# Patient Record
Sex: Female | Born: 1935 | Race: White | Hispanic: No | Smoking: Never smoker
Health system: Southern US, Community
[De-identification: ages and names within clinical notes are randomized; demographics above are authoritative.]

## PROBLEM LIST (undated history)

## (undated) DIAGNOSIS — IMO0001 Reserved for inherently not codable concepts without codable children: Secondary | ICD-10-CM

## (undated) DIAGNOSIS — I639 Cerebral infarction, unspecified: Secondary | ICD-10-CM

## (undated) DIAGNOSIS — N189 Chronic kidney disease, unspecified: Secondary | ICD-10-CM

## (undated) DIAGNOSIS — S72141A Displaced intertrochanteric fracture of right femur, initial encounter for closed fracture: Secondary | ICD-10-CM

## (undated) DIAGNOSIS — C539 Malignant neoplasm of cervix uteri, unspecified: Secondary | ICD-10-CM

## (undated) DIAGNOSIS — J449 Chronic obstructive pulmonary disease, unspecified: Secondary | ICD-10-CM

## (undated) DIAGNOSIS — Z923 Personal history of irradiation: Secondary | ICD-10-CM

## (undated) DIAGNOSIS — E785 Hyperlipidemia, unspecified: Secondary | ICD-10-CM

## (undated) DIAGNOSIS — H269 Unspecified cataract: Secondary | ICD-10-CM

## (undated) DIAGNOSIS — IMO0002 Reserved for concepts with insufficient information to code with codable children: Secondary | ICD-10-CM

## (undated) DIAGNOSIS — M1990 Unspecified osteoarthritis, unspecified site: Secondary | ICD-10-CM

## (undated) DIAGNOSIS — I10 Essential (primary) hypertension: Secondary | ICD-10-CM

## (undated) DIAGNOSIS — I209 Angina pectoris, unspecified: Secondary | ICD-10-CM

## (undated) DIAGNOSIS — I219 Acute myocardial infarction, unspecified: Secondary | ICD-10-CM

## (undated) DIAGNOSIS — K219 Gastro-esophageal reflux disease without esophagitis: Secondary | ICD-10-CM

## (undated) DIAGNOSIS — M810 Age-related osteoporosis without current pathological fracture: Secondary | ICD-10-CM

## (undated) DIAGNOSIS — Z5189 Encounter for other specified aftercare: Secondary | ICD-10-CM

## (undated) DIAGNOSIS — C519 Malignant neoplasm of vulva, unspecified: Secondary | ICD-10-CM

## (undated) HISTORY — DX: Gastro-esophageal reflux disease without esophagitis: K21.9

## (undated) HISTORY — DX: Cerebral infarction, unspecified: I63.9

## (undated) HISTORY — DX: Personal history of irradiation: Z92.3

## (undated) HISTORY — PX: ABDOMINAL HYSTERECTOMY: SHX81

## (undated) HISTORY — DX: Unspecified cataract: H26.9

## (undated) HISTORY — DX: Reserved for inherently not codable concepts without codable children: IMO0001

## (undated) HISTORY — DX: Reserved for concepts with insufficient information to code with codable children: IMO0002

## (undated) HISTORY — DX: Encounter for other specified aftercare: Z51.89

## (undated) HISTORY — DX: Age-related osteoporosis without current pathological fracture: M81.0

## (undated) HISTORY — PX: HERNIA REPAIR: SHX51

## (undated) HISTORY — PX: TUBAL LIGATION: SHX77

## (undated) HISTORY — DX: Acute myocardial infarction, unspecified: I21.9

## (undated) HISTORY — DX: Malignant neoplasm of cervix uteri, unspecified: C53.9

## (undated) HISTORY — PX: APPENDECTOMY: SHX54

---

## 1976-07-18 DIAGNOSIS — I639 Cerebral infarction, unspecified: Secondary | ICD-10-CM

## 1976-07-18 HISTORY — DX: Cerebral infarction, unspecified: I63.9

## 1997-12-22 ENCOUNTER — Other Ambulatory Visit: Admission: RE | Admit: 1997-12-22 | Discharge: 1997-12-22 | Payer: Self-pay | Admitting: *Deleted

## 1999-02-01 ENCOUNTER — Other Ambulatory Visit: Admission: RE | Admit: 1999-02-01 | Discharge: 1999-02-01 | Payer: Self-pay | Admitting: *Deleted

## 1999-06-22 ENCOUNTER — Encounter: Payer: Self-pay | Admitting: *Deleted

## 1999-06-22 ENCOUNTER — Encounter: Admission: RE | Admit: 1999-06-22 | Discharge: 1999-06-22 | Payer: Self-pay | Admitting: *Deleted

## 2000-02-04 ENCOUNTER — Encounter: Payer: Self-pay | Admitting: Internal Medicine

## 2000-02-04 ENCOUNTER — Encounter: Admission: RE | Admit: 2000-02-04 | Discharge: 2000-02-04 | Payer: Self-pay | Admitting: Rheumatology

## 2000-09-09 ENCOUNTER — Emergency Department (HOSPITAL_COMMUNITY): Admission: EM | Admit: 2000-09-09 | Discharge: 2000-09-09 | Payer: Self-pay | Admitting: Emergency Medicine

## 2000-09-09 ENCOUNTER — Encounter: Payer: Self-pay | Admitting: Emergency Medicine

## 2001-01-20 ENCOUNTER — Emergency Department (HOSPITAL_COMMUNITY): Admission: EM | Admit: 2001-01-20 | Discharge: 2001-01-20 | Payer: Self-pay

## 2001-05-02 ENCOUNTER — Encounter: Payer: Self-pay | Admitting: Internal Medicine

## 2001-05-02 ENCOUNTER — Encounter: Admission: RE | Admit: 2001-05-02 | Discharge: 2001-05-02 | Payer: Self-pay | Admitting: Internal Medicine

## 2002-05-07 ENCOUNTER — Encounter: Payer: Self-pay | Admitting: Internal Medicine

## 2002-05-07 ENCOUNTER — Encounter: Admission: RE | Admit: 2002-05-07 | Discharge: 2002-05-07 | Payer: Self-pay | Admitting: Internal Medicine

## 2003-08-22 ENCOUNTER — Ambulatory Visit (HOSPITAL_COMMUNITY): Admission: RE | Admit: 2003-08-22 | Discharge: 2003-08-22 | Payer: Self-pay | Admitting: Orthopedic Surgery

## 2003-10-27 ENCOUNTER — Inpatient Hospital Stay (HOSPITAL_COMMUNITY): Admission: EM | Admit: 2003-10-27 | Discharge: 2003-10-28 | Payer: Self-pay | Admitting: Emergency Medicine

## 2004-04-08 ENCOUNTER — Encounter: Admission: RE | Admit: 2004-04-08 | Discharge: 2004-04-08 | Payer: Self-pay | Admitting: Internal Medicine

## 2004-06-01 ENCOUNTER — Inpatient Hospital Stay (HOSPITAL_COMMUNITY): Admission: RE | Admit: 2004-06-01 | Discharge: 2004-06-11 | Payer: Self-pay | Admitting: Surgery

## 2004-06-02 ENCOUNTER — Encounter (INDEPENDENT_AMBULATORY_CARE_PROVIDER_SITE_OTHER): Payer: Self-pay | Admitting: *Deleted

## 2005-08-18 ENCOUNTER — Encounter: Admission: RE | Admit: 2005-08-18 | Discharge: 2005-08-18 | Payer: Self-pay | Admitting: Internal Medicine

## 2006-03-05 ENCOUNTER — Emergency Department (HOSPITAL_COMMUNITY): Admission: EM | Admit: 2006-03-05 | Discharge: 2006-03-05 | Payer: Self-pay | Admitting: Emergency Medicine

## 2006-05-12 ENCOUNTER — Emergency Department (HOSPITAL_COMMUNITY): Admission: EM | Admit: 2006-05-12 | Discharge: 2006-05-12 | Payer: Self-pay | Admitting: Emergency Medicine

## 2006-09-13 ENCOUNTER — Encounter: Admission: RE | Admit: 2006-09-13 | Discharge: 2006-09-13 | Payer: Self-pay | Admitting: Internal Medicine

## 2007-10-15 ENCOUNTER — Encounter: Admission: RE | Admit: 2007-10-15 | Discharge: 2007-10-15 | Payer: Self-pay | Admitting: Internal Medicine

## 2008-08-09 ENCOUNTER — Emergency Department (HOSPITAL_COMMUNITY): Admission: EM | Admit: 2008-08-09 | Discharge: 2008-08-09 | Payer: Self-pay | Admitting: Emergency Medicine

## 2008-10-17 ENCOUNTER — Encounter: Admission: RE | Admit: 2008-10-17 | Discharge: 2008-10-17 | Payer: Self-pay | Admitting: Internal Medicine

## 2009-01-22 ENCOUNTER — Encounter: Admission: RE | Admit: 2009-01-22 | Discharge: 2009-01-22 | Payer: Self-pay | Admitting: Internal Medicine

## 2009-10-22 ENCOUNTER — Encounter: Admission: RE | Admit: 2009-10-22 | Discharge: 2009-10-22 | Payer: Self-pay | Admitting: Internal Medicine

## 2009-11-11 ENCOUNTER — Encounter: Admission: RE | Admit: 2009-11-11 | Discharge: 2009-11-11 | Payer: Self-pay | Admitting: Internal Medicine

## 2009-11-11 ENCOUNTER — Inpatient Hospital Stay (HOSPITAL_COMMUNITY): Admission: EM | Admit: 2009-11-11 | Discharge: 2009-11-13 | Payer: Self-pay | Admitting: Emergency Medicine

## 2010-02-15 ENCOUNTER — Encounter: Admission: RE | Admit: 2010-02-15 | Discharge: 2010-04-15 | Payer: Self-pay | Admitting: Nephrology

## 2010-03-15 ENCOUNTER — Emergency Department (HOSPITAL_COMMUNITY)
Admission: EM | Admit: 2010-03-15 | Discharge: 2010-03-15 | Payer: Self-pay | Source: Home / Self Care | Admitting: Family Medicine

## 2010-07-03 ENCOUNTER — Inpatient Hospital Stay (HOSPITAL_COMMUNITY)
Admission: EM | Admit: 2010-07-03 | Discharge: 2010-07-09 | Payer: Self-pay | Source: Home / Self Care | Attending: Internal Medicine | Admitting: Internal Medicine

## 2010-07-06 ENCOUNTER — Encounter (INDEPENDENT_AMBULATORY_CARE_PROVIDER_SITE_OTHER): Payer: Self-pay | Admitting: Internal Medicine

## 2010-07-21 ENCOUNTER — Inpatient Hospital Stay (HOSPITAL_COMMUNITY): Admission: EM | Admit: 2010-07-21 | Discharge: 2010-07-27 | Payer: Self-pay | Source: Home / Self Care

## 2010-07-21 LAB — CBC
HCT: 34.6 % — ABNORMAL LOW (ref 36.0–46.0)
Hemoglobin: 11 g/dL — ABNORMAL LOW (ref 12.0–15.0)
MCH: 29.1 pg (ref 26.0–34.0)
MCHC: 31.8 g/dL (ref 30.0–36.0)
MCV: 91.5 fL (ref 78.0–100.0)
Platelets: 351 10*3/uL (ref 150–400)
RBC: 3.78 MIL/uL — ABNORMAL LOW (ref 3.87–5.11)
RDW: 13.2 % (ref 11.5–15.5)
WBC: 15.1 10*3/uL — ABNORMAL HIGH (ref 4.0–10.5)

## 2010-07-21 LAB — URINALYSIS, ROUTINE W REFLEX MICROSCOPIC
Bilirubin Urine: NEGATIVE
Hemoglobin, Urine: NEGATIVE
Ketones, ur: NEGATIVE mg/dL
Nitrite: NEGATIVE
Protein, ur: NEGATIVE mg/dL
Specific Gravity, Urine: 1.015 (ref 1.005–1.030)
Urine Glucose, Fasting: NEGATIVE mg/dL
Urobilinogen, UA: 0.2 mg/dL (ref 0.0–1.0)
pH: 6 (ref 5.0–8.0)

## 2010-07-21 LAB — COMPREHENSIVE METABOLIC PANEL
ALT: 10 U/L (ref 0–35)
AST: 18 U/L (ref 0–37)
Albumin: 3.1 g/dL — ABNORMAL LOW (ref 3.5–5.2)
Alkaline Phosphatase: 89 U/L (ref 39–117)
BUN: 23 mg/dL (ref 6–23)
CO2: 23 mEq/L (ref 19–32)
Calcium: 8.9 mg/dL (ref 8.4–10.5)
Chloride: 105 mEq/L (ref 96–112)
Creatinine, Ser: 2.08 mg/dL — ABNORMAL HIGH (ref 0.4–1.2)
GFR calc Af Amer: 28 mL/min — ABNORMAL LOW (ref >60)
GFR calc non Af Amer: 23 mL/min — ABNORMAL LOW (ref >60)
Glucose, Bld: 104 mg/dL — ABNORMAL HIGH (ref 70–99)
Potassium: 4.2 mEq/L (ref 3.5–5.1)
Sodium: 137 mEq/L (ref 135–145)
Total Bilirubin: 0.5 mg/dL (ref 0.3–1.2)
Total Protein: 6.3 g/dL (ref 6.0–8.3)

## 2010-07-21 LAB — DIFFERENTIAL
Basophils Absolute: 0 10*3/uL (ref 0.0–0.1)
Basophils Relative: 0 % (ref 0–1)
Eosinophils Absolute: 0.2 10*3/uL (ref 0.0–0.7)
Eosinophils Relative: 1 % (ref 0–5)
Lymphocytes Relative: 5 % — ABNORMAL LOW (ref 12–46)
Lymphs Abs: 0.8 10*3/uL (ref 0.7–4.0)
Monocytes Absolute: 1.3 10*3/uL — ABNORMAL HIGH (ref 0.1–1.0)
Monocytes Relative: 9 % (ref 3–12)
Neutro Abs: 12.8 10*3/uL — ABNORMAL HIGH (ref 1.7–7.7)
Neutrophils Relative %: 85 % — ABNORMAL HIGH (ref 43–77)

## 2010-07-21 LAB — URINE MICROSCOPIC-ADD ON

## 2010-07-21 LAB — LIPASE, BLOOD: Lipase: 30 U/L (ref 11–59)

## 2010-07-22 LAB — CBC
HCT: 34.4 % — ABNORMAL LOW (ref 36.0–46.0)
Hemoglobin: 11 g/dL — ABNORMAL LOW (ref 12.0–15.0)
MCH: 29.3 pg (ref 26.0–34.0)
MCHC: 32 g/dL (ref 30.0–36.0)
MCV: 91.5 fL (ref 78.0–100.0)
Platelets: 338 10*3/uL (ref 150–400)
RBC: 3.76 MIL/uL — ABNORMAL LOW (ref 3.87–5.11)
RDW: 13.1 % (ref 11.5–15.5)
WBC: 15.6 10*3/uL — ABNORMAL HIGH (ref 4.0–10.5)

## 2010-07-22 LAB — BASIC METABOLIC PANEL
BUN: 19 mg/dL (ref 6–23)
CO2: 23 mEq/L (ref 19–32)
Calcium: 8.5 mg/dL (ref 8.4–10.5)
Chloride: 107 mEq/L (ref 96–112)
Creatinine, Ser: 2.05 mg/dL — ABNORMAL HIGH (ref 0.4–1.2)
GFR calc Af Amer: 28 mL/min — ABNORMAL LOW (ref >60)
GFR calc non Af Amer: 23 mL/min — ABNORMAL LOW (ref >60)
Glucose, Bld: 111 mg/dL — ABNORMAL HIGH (ref 70–99)
Potassium: 4.4 mEq/L (ref 3.5–5.1)
Sodium: 137 mEq/L (ref 135–145)

## 2010-07-22 LAB — PHOSPHORUS: Phosphorus: 2.8 mg/dL (ref 2.3–4.6)

## 2010-07-22 LAB — MAGNESIUM: Magnesium: 1.7 mg/dL (ref 1.5–2.5)

## 2010-07-23 LAB — CBC
HCT: 31 % — ABNORMAL LOW (ref 36.0–46.0)
Hemoglobin: 9.8 g/dL — ABNORMAL LOW (ref 12.0–15.0)
MCH: 29 pg (ref 26.0–34.0)
MCHC: 31.6 g/dL (ref 30.0–36.0)
MCV: 91.7 fL (ref 78.0–100.0)
Platelets: 291 10*3/uL (ref 150–400)
RBC: 3.38 MIL/uL — ABNORMAL LOW (ref 3.87–5.11)
RDW: 13.2 % (ref 11.5–15.5)
WBC: 12.9 10*3/uL — ABNORMAL HIGH (ref 4.0–10.5)

## 2010-08-02 LAB — BASIC METABOLIC PANEL
BUN: 28 mg/dL — ABNORMAL HIGH (ref 6–23)
BUN: 23 mg/dL (ref 6–23)
BUN: 20 mg/dL (ref 6–23)
CO2: 16 mEq/L — ABNORMAL LOW (ref 19–32)
CO2: 16 mEq/L — ABNORMAL LOW (ref 19–32)
CO2: 19 mEq/L (ref 19–32)
Calcium: 8.2 mg/dL — ABNORMAL LOW (ref 8.4–10.5)
Calcium: 8 mg/dL — ABNORMAL LOW (ref 8.4–10.5)
Calcium: 8.1 mg/dL — ABNORMAL LOW (ref 8.4–10.5)
Chloride: 109 mEq/L (ref 96–112)
Chloride: 116 mEq/L — ABNORMAL HIGH (ref 96–112)
Chloride: 115 mEq/L — ABNORMAL HIGH (ref 96–112)
Creatinine, Ser: 2.48 mg/dL — ABNORMAL HIGH (ref 0.4–1.2)
Creatinine, Ser: 2.22 mg/dL — ABNORMAL HIGH (ref 0.4–1.2)
Creatinine, Ser: 1.92 mg/dL — ABNORMAL HIGH (ref 0.4–1.2)
GFR calc Af Amer: 23 mL/min — ABNORMAL LOW (ref >60)
GFR calc Af Amer: 26 mL/min — ABNORMAL LOW (ref >60)
GFR calc Af Amer: 30 mL/min — ABNORMAL LOW (ref >60)
GFR calc non Af Amer: 19 mL/min — ABNORMAL LOW (ref >60)
GFR calc non Af Amer: 21 mL/min — ABNORMAL LOW (ref >60)
GFR calc non Af Amer: 25 mL/min — ABNORMAL LOW (ref >60)
Glucose, Bld: 66 mg/dL — ABNORMAL LOW (ref 70–99)
Glucose, Bld: 89 mg/dL (ref 70–99)
Glucose, Bld: 111 mg/dL — ABNORMAL HIGH (ref 70–99)
Potassium: 3.8 mEq/L (ref 3.5–5.1)
Potassium: 3.3 mEq/L — ABNORMAL LOW (ref 3.5–5.1)
Potassium: 3.3 mEq/L — ABNORMAL LOW (ref 3.5–5.1)
Sodium: 136 mEq/L (ref 135–145)
Sodium: 139 mEq/L (ref 135–145)
Sodium: 141 mEq/L (ref 135–145)

## 2010-08-02 LAB — CULTURE, BLOOD (ROUTINE X 2)
Culture  Setup Time: 201201052239
Culture  Setup Time: 201201052239
Culture: NO GROWTH
Culture: NO GROWTH

## 2010-08-02 LAB — CBC
HCT: 30.8 % — ABNORMAL LOW (ref 36.0–46.0)
HCT: 32.2 % — ABNORMAL LOW (ref 36.0–46.0)
Hemoglobin: 10 g/dL — ABNORMAL LOW (ref 12.0–15.0)
Hemoglobin: 10.6 g/dL — ABNORMAL LOW (ref 12.0–15.0)
MCH: 29.2 pg (ref 26.0–34.0)
MCH: 29.3 pg (ref 26.0–34.0)
MCHC: 32.5 g/dL (ref 30.0–36.0)
MCHC: 32.9 g/dL (ref 30.0–36.0)
MCV: 89.8 fL (ref 78.0–100.0)
MCV: 89 fL (ref 78.0–100.0)
Platelets: 364 10*3/uL (ref 150–400)
Platelets: 359 10*3/uL (ref 150–400)
RBC: 3.43 MIL/uL — ABNORMAL LOW (ref 3.87–5.11)
RBC: 3.62 MIL/uL — ABNORMAL LOW (ref 3.87–5.11)
RDW: 13.4 % (ref 11.5–15.5)
RDW: 13.5 % (ref 11.5–15.5)
WBC: 23.6 10*3/uL — ABNORMAL HIGH (ref 4.0–10.5)
WBC: 19.4 10*3/uL — ABNORMAL HIGH (ref 4.0–10.5)

## 2010-08-02 LAB — CLOSTRIDIUM DIFFICILE BY PCR

## 2010-08-08 ENCOUNTER — Encounter: Payer: Self-pay | Admitting: Internal Medicine

## 2010-08-13 NOTE — Consult Note (Signed)
  NAMELURLINE, CAVER NO.:  [id number]  MEDICAL RECORD NO.:  [id number]          PATIENT TYPE:  INP  LOCATION:  5524                         FACILITY:  [facility]  PHYSICIAN:  Bernette Redbird, M.D.   DATE OF BIRTH:  12-20-1925  DATE OF CONSULTATION: DATE OF DISCHARGE:                                CONSULTATION   REASON FOR CONSULTATION:  Dr. Carollee Massed asked [place] to see this very pleasant 77 year old female because of diarrhea and an abnormal radiographic appearance of the colon on CT scanning.  HISTORY OF PRESENT ILLNESS:  The patient gives a roughly 2-week history of diarrhea, which coincides with antibiotic therapy for a UTI. However, several days ago, her symptoms changed with low abdominal discomfort and dehydration, and she came to the hospital where a CT showed significant proximal colitis involving the ascending and transverse colon.  Her diarrhea was never bloody.  She really did not have abdominal pain except at time of bowel movements.  She has no prior history of colitis.  It is not clear at this time whether she has had a previous colonoscopy.  I have checked office records, and as of this time, we have not located any such record of a previous colonoscopy.  ALLERGIES:  PENICILLIN and SULFA.  OUTPATIENT MEDICATIONS: 1. Omeprazole. 2. Tramadol. 3. Lipitor. 4. Verapamil. 5. Amlodipine. 6. Vitamins.  OPERATIONS:  Appendectomy, hysterectomy, and repair of rectal prolapse.  MEDICAL ILLNESSES:  Chronic renal insufficiency, history of MI, hyperlipidemia, GERD, and hypertension.  HABITS:  Nonsmoker, nondrinker.  FAMILY HISTORY:  Negative for GI illnesses as far as I am aware.  SOCIAL HISTORY:  Lives alone.  REVIEW OF SYSTEMS:  See HPI.  I do not believe there were ever fevers, and no one around her has been sick with any similar illness.  PHYSICAL EXAMINATION:  GENERAL:  A pleasant, petite Caucasian female in no acute distress,  whatsoever. HEENT:  Anicteric, no pallor. CHEST:  Clear. HEART:  Without gallops, murmurs, or arrhythmias. ABDOMEN:  Without organomegaly, guarding, mass effect, or significant tenderness at this time.  LABORATORY DATA:  White count normal, hemoglobin 10.7 as of yesterday, and C diff negative.  Creatinine 1.9.  IMPRESSION:  I think this is most compatible with ischemic colitis.  PLAN:  Colonoscopy today to try and clarify the diagnosis and hence the prognosis and possibly to guide further management.  Ashby Dawes, purpose, and risks reviewed and patient and son agreeable.  We appreciate the opportunity to have seen this very pleasant patient in consultation with you.          ______________________________ Bernette Redbird, M.D.     RB/MEDQ  D:  07/06/2010  T:  07/07/2010  Job:  [id number]  cc:   Merlene Laughter. Renae Gloss, M.D.  Electronically Signed by Bernette Redbird M.D. on 08/13/2010 04:50:28 PM

## 2010-08-13 NOTE — Op Note (Signed)
Meredith Bender, Meredith Bender NO.:  [id number]  MEDICAL RECORD NO.:  [id number]          PATIENT TYPE:  INP  LOCATION:  5524                         FACILITY:  [facility]  PHYSICIAN:  Bernette Redbird, M.D.   DATE OF BIRTH:  1926-01-18  DATE OF PROCEDURE:  07/06/2010 DATE OF DISCHARGE:                              OPERATIVE REPORT   PROCEDURE:  Colonoscopy with biopsy.  INDICATION:  An 77 year old female with significant diarrhea of 2 weeks' duration, following antibiotic therapy for UTIs, but with negative C. diff toxin and with CT scan showing evidence of significant colitis in the proximal colon.  FINDINGS:  Remarkably normal exam.  Diminutive polyp biopsied.  Random biopsies obtained.  PROCEDURE:  The nature, purpose, risks of the procedure have been reviewed with the patient and her son.  She provided written consent and was brought from her hospital room to the endoscopy unit where sedation with fentanyl 75 mcg and Versed 6 mg IV was administered, resulting in mild-to-moderate sedation without clinical instability.  The Pentax pediatric video colonoscope was advanced around the colon with some degree of looping, which was controlled by external abdominal compression and placing the patient in the supine position.  This procedure was done unprepped.  There was a moderate amount of liquid milk shake consistency stool scattered throughout the colon.  No formed stool was seen.  In all areas, the mucosa looked normal, with the exception of the rectosigmoid where there was decreased vascularity consistent with some mucosal edema.  However, frank inflammatory changes were absent, specifically, no erythema, friability, granularity, exudate, or ulcerations.  At the conclusion of the procedure, random mucosal biopsies were obtained (except from the rectum) to check for evidence of microscopic colitis.  Just above the cecum in the ascending colon was a 3-4-mm sessile  polyp removed by several cold biopsies with adequate hemostasis.  No large polyps, cancer, pseudomembranes, or obvious colitis were noted. I did not see any diverticular disease or vascular malformations.  Because this procedure was done unprepped and there was a moderate amount of liquid stool which was not readily able to be suctioned up, I would not consider this a definitive screening exam, although I am confident that any large, bulky, circumferential, masses would have been identified in that any diffuse mucosal abnormalities would have been appreciated.  The patient tolerated this procedure well and there were no apparent complications.  IMPRESSION:  Essentially normal examination except for solitary diminutive polyp.  PLAN: 1. Await pathology on polyp and random biopsies. 2. In view of the patient's advanced age, I do not feel that routine     colonoscopic surveillance would be needed even if the polyp removed     comes back adenomatous on pathology. 3. In view of the absence of any colitis or C. diff infection, I think     it is now appropriate to make several interventions including     probiotics, cessation of antibiotic therapy since her urine culture     was negative, institution of a low-residue diet, and use of Imodium     to control diarrhea as needed.  ______________________________ Bernette Redbird, M.D.     RB/MEDQ  D:  07/06/2010  T:  07/07/2010  Job:  [id number]  cc:   Merlene Laughter. Renae Gloss, M.D.  Electronically Signed by Bernette Redbird M.D. on 08/13/2010 04:50:26 PM

## 2010-08-21 LAB — URINE CULTURE
Colony Count: NO GROWTH
Culture  Setup Time: 201201041243
Culture: NO GROWTH

## 2010-09-27 LAB — DIFFERENTIAL
Basophils Absolute: 0 10*3/uL (ref 0.0–0.1)
Eosinophils Absolute: 0.2 10*3/uL (ref 0.0–0.7)
Eosinophils Relative: 2 % (ref 0–5)
Eosinophils Relative: 6 % — ABNORMAL HIGH (ref 0–5)
Lymphocytes Relative: 10 % — ABNORMAL LOW (ref 12–46)
Lymphocytes Relative: 24 % (ref 12–46)
Monocytes Absolute: 0.5 10*3/uL (ref 0.1–1.0)
Monocytes Absolute: 0.6 10*3/uL (ref 0.1–1.0)
Neutrophils Relative %: 82 % — ABNORMAL HIGH (ref 43–77)

## 2010-09-27 LAB — BASIC METABOLIC PANEL
BUN: 10 mg/dL (ref 6–23)
BUN: 11 mg/dL (ref 6–23)
BUN: 14 mg/dL (ref 6–23)
BUN: 15 mg/dL (ref 6–23)
BUN: 9 mg/dL (ref 6–23)
CO2: 23 mEq/L (ref 19–32)
Calcium: 8.3 mg/dL — ABNORMAL LOW (ref 8.4–10.5)
Calcium: 8.4 mg/dL (ref 8.4–10.5)
Chloride: 108 mEq/L (ref 96–112)
Chloride: 112 mEq/L (ref 96–112)
Chloride: 114 mEq/L — ABNORMAL HIGH (ref 96–112)
Creatinine, Ser: 1.95 mg/dL — ABNORMAL HIGH (ref 0.4–1.2)
Creatinine, Ser: 2.01 mg/dL — ABNORMAL HIGH (ref 0.4–1.2)
Creatinine, Ser: 2.05 mg/dL — ABNORMAL HIGH (ref 0.4–1.2)
Creatinine, Ser: 2.32 mg/dL — ABNORMAL HIGH (ref 0.4–1.2)
GFR calc Af Amer: 29 mL/min — ABNORMAL LOW (ref >60)
GFR calc non Af Amer: 23 mL/min — ABNORMAL LOW (ref >60)
GFR calc non Af Amer: 24 mL/min — ABNORMAL LOW (ref >60)
Glucose, Bld: 103 mg/dL — ABNORMAL HIGH (ref 70–99)
Potassium: 3.7 mEq/L (ref 3.5–5.1)
Potassium: 4.6 mEq/L (ref 3.5–5.1)

## 2010-09-27 LAB — LIPID PANEL
LDL Cholesterol: 49 mg/dL (ref 0–99)
Total CHOL/HDL Ratio: 2.3 RATIO
Triglycerides: 80 mg/dL (ref <150)
VLDL: 16 mg/dL (ref 0–40)

## 2010-09-27 LAB — URINE MICROSCOPIC-ADD ON

## 2010-09-27 LAB — COMPREHENSIVE METABOLIC PANEL
ALT: 10 U/L (ref 0–35)
ALT: 8 U/L (ref 0–35)
AST: 14 U/L (ref 0–37)
Albumin: 2.7 g/dL — ABNORMAL LOW (ref 3.5–5.2)
Alkaline Phosphatase: 112 U/L (ref 39–117)
BUN: 15 mg/dL (ref 6–23)
CO2: 19 mEq/L (ref 19–32)
Calcium: 8.9 mg/dL (ref 8.4–10.5)
Chloride: 113 mEq/L — ABNORMAL HIGH (ref 96–112)
Creatinine, Ser: 1.88 mg/dL — ABNORMAL HIGH (ref 0.4–1.2)
GFR calc Af Amer: 31 mL/min — ABNORMAL LOW (ref >60)
GFR calc non Af Amer: 25 mL/min — ABNORMAL LOW (ref >60)
Glucose, Bld: 105 mg/dL — ABNORMAL HIGH (ref 70–99)
Potassium: 4 mEq/L (ref 3.5–5.1)
Sodium: 140 mEq/L (ref 135–145)
Sodium: 141 mEq/L (ref 135–145)
Total Bilirubin: 0.4 mg/dL (ref 0.3–1.2)
Total Protein: 6.3 g/dL (ref 6.0–8.3)

## 2010-09-27 LAB — CBC
HCT: 31.4 % — ABNORMAL LOW (ref 36.0–46.0)
HCT: 32.1 % — ABNORMAL LOW (ref 36.0–46.0)
HCT: 32.7 % — ABNORMAL LOW (ref 36.0–46.0)
Hemoglobin: 10.7 g/dL — ABNORMAL LOW (ref 12.0–15.0)
MCH: 28.9 pg (ref 26.0–34.0)
MCH: 29.2 pg (ref 26.0–34.0)
MCH: 29.3 pg (ref 26.0–34.0)
MCHC: 32.2 g/dL (ref 30.0–36.0)
MCHC: 32.7 g/dL (ref 30.0–36.0)
MCHC: 33.3 g/dL (ref 30.0–36.0)
MCV: 87.7 fL (ref 78.0–100.0)
MCV: 89.1 fL (ref 78.0–100.0)
MCV: 89.3 fL (ref 78.0–100.0)
MCV: 89.5 fL (ref 78.0–100.0)
Platelets: 250 10*3/uL (ref 150–400)
Platelets: 275 10*3/uL (ref 150–400)
Platelets: 292 10*3/uL (ref 150–400)
Platelets: 320 10*3/uL (ref 150–400)
Platelets: 361 10*3/uL (ref 150–400)
RBC: 3.57 MIL/uL — ABNORMAL LOW (ref 3.87–5.11)
RBC: 3.62 MIL/uL — ABNORMAL LOW (ref 3.87–5.11)
RDW: 12.7 % (ref 11.5–15.5)
RDW: 13 % (ref 11.5–15.5)
RDW: 13.2 % (ref 11.5–15.5)
RDW: 13.3 % (ref 11.5–15.5)
RDW: 13.5 % (ref 11.5–15.5)
RDW: 13.6 % (ref 11.5–15.5)
WBC: 5.7 10*3/uL (ref 4.0–10.5)
WBC: 6.3 10*3/uL (ref 4.0–10.5)
WBC: 6.3 10*3/uL (ref 4.0–10.5)
WBC: 7.7 10*3/uL (ref 4.0–10.5)

## 2010-09-27 LAB — LACTIC ACID, PLASMA: Lactic Acid, Venous: 1 mmol/L (ref 0.5–2.2)

## 2010-09-27 LAB — URINALYSIS, ROUTINE W REFLEX MICROSCOPIC
Bilirubin Urine: NEGATIVE
Glucose, UA: NEGATIVE mg/dL
Hgb urine dipstick: NEGATIVE
Specific Gravity, Urine: 1.01 (ref 1.005–1.030)

## 2010-09-27 LAB — URINE CULTURE

## 2010-09-27 LAB — MAGNESIUM: Magnesium: 1.6 mg/dL (ref 1.5–2.5)

## 2010-09-27 LAB — IRON AND TIBC
Iron: 35 ug/dL — ABNORMAL LOW (ref 42–135)
Saturation Ratios: 19 % — ABNORMAL LOW (ref 20–55)
UIBC: 153 ug/dL

## 2010-09-27 LAB — HEMOCCULT GUIAC POC 1CARD (OFFICE): Fecal Occult Bld: NEGATIVE

## 2010-09-27 LAB — TSH: TSH: 0.841 u[IU]/mL (ref 0.350–4.500)

## 2010-09-27 LAB — VITAMIN B12: Vitamin B-12: 679 pg/mL (ref 211–911)

## 2010-10-05 LAB — URINALYSIS, ROUTINE W REFLEX MICROSCOPIC
Glucose, UA: NEGATIVE mg/dL
Hgb urine dipstick: NEGATIVE
Ketones, ur: NEGATIVE mg/dL
Protein, ur: NEGATIVE mg/dL

## 2010-10-05 LAB — COMPREHENSIVE METABOLIC PANEL
Albumin: 3.3 g/dL — ABNORMAL LOW (ref 3.5–5.2)
Alkaline Phosphatase: 67 U/L (ref 39–117)
BUN: 35 mg/dL — ABNORMAL HIGH (ref 6–23)
Calcium: 8.4 mg/dL (ref 8.4–10.5)
Creatinine, Ser: 2.82 mg/dL — ABNORMAL HIGH (ref 0.4–1.2)
Glucose, Bld: 106 mg/dL — ABNORMAL HIGH (ref 70–99)
Potassium: 4.9 mEq/L (ref 3.5–5.1)
Total Protein: 5.9 g/dL — ABNORMAL LOW (ref 6.0–8.3)

## 2010-10-05 LAB — TSH: TSH: 2.44 u[IU]/mL (ref 0.350–4.500)

## 2010-10-05 LAB — CBC
HCT: 31.8 % — ABNORMAL LOW (ref 36.0–46.0)
Hemoglobin: 10.7 g/dL — ABNORMAL LOW (ref 12.0–15.0)
Hemoglobin: 13.5 g/dL (ref 12.0–15.0)
MCHC: 33.9 g/dL (ref 30.0–36.0)
MCHC: 34.2 g/dL (ref 30.0–36.0)
MCV: 93.3 fL (ref 78.0–100.0)
MCV: 93.6 fL (ref 78.0–100.0)
Platelets: 199 10*3/uL (ref 150–400)
RBC: 3.41 MIL/uL — ABNORMAL LOW (ref 3.87–5.11)
RBC: 4.25 MIL/uL (ref 3.87–5.11)
RDW: 13 % (ref 11.5–15.5)
WBC: 6 10*3/uL (ref 4.0–10.5)
WBC: 9 10*3/uL (ref 4.0–10.5)

## 2010-10-05 LAB — POCT CARDIAC MARKERS
CKMB, poc: <1 ng/mL — ABNORMAL LOW (ref 1.0–8.0)
Myoglobin, poc: 113 ng/mL (ref 12–200)

## 2010-10-05 LAB — HEMOGLOBIN A1C
Hgb A1c MFr Bld: 5.4 % (ref <5.7)
Mean Plasma Glucose: 108 mg/dL (ref <117)

## 2010-10-05 LAB — POCT I-STAT, CHEM 8
BUN: 41 mg/dL — ABNORMAL HIGH (ref 6–23)
Creatinine, Ser: 3 mg/dL — ABNORMAL HIGH (ref 0.4–1.2)
Glucose, Bld: 69 mg/dL — ABNORMAL LOW (ref 70–99)
Hemoglobin: 14.6 g/dL (ref 12.0–15.0)
TCO2: 18 mmol/L (ref 0–100)

## 2010-10-05 LAB — URINE CULTURE: Colony Count: 25000

## 2010-10-05 LAB — DIFFERENTIAL
Basophils Relative: 1 % (ref 0–1)
Lymphs Abs: 2 10*3/uL (ref 0.7–4.0)
Monocytes Absolute: 0.6 10*3/uL (ref 0.1–1.0)
Monocytes Relative: 6 % (ref 3–12)
Neutro Abs: 6.1 10*3/uL (ref 1.7–7.7)
Neutrophils Relative %: 69 % (ref 43–77)

## 2010-10-05 LAB — CHLORIDE, URINE, RANDOM: Chloride Urine: 53 mEq/L

## 2010-10-05 LAB — CK TOTAL AND CKMB (NOT AT ARMC)
CK, MB: 2.7 ng/mL (ref 0.3–4.0)
CK, MB: 3.2 ng/mL (ref 0.3–4.0)
Relative Index: INVALID (ref 0.0–2.5)
Total CK: 34 U/L (ref 7–177)
Total CK: 38 U/L (ref 7–177)
Total CK: 39 U/L (ref 7–177)

## 2010-10-05 LAB — BASIC METABOLIC PANEL
Chloride: 117 mEq/L — ABNORMAL HIGH (ref 96–112)
Creatinine, Ser: 2.42 mg/dL — ABNORMAL HIGH (ref 0.4–1.2)
GFR calc Af Amer: 23 mL/min — ABNORMAL LOW (ref >60)
Potassium: 4.6 mEq/L (ref 3.5–5.1)
Sodium: 140 mEq/L (ref 135–145)

## 2010-10-05 LAB — LIPID PANEL
Cholesterol: 132 mg/dL (ref 0–200)
HDL: 45 mg/dL (ref >39)
LDL Cholesterol: 68 mg/dL (ref 0–99)

## 2010-10-05 LAB — OSMOLALITY, URINE: Osmolality, Ur: 183 mOsm/kg — ABNORMAL LOW (ref 390–1090)

## 2010-10-05 LAB — TROPONIN I
Troponin I: 0.02 ng/mL (ref 0.00–0.06)
Troponin I: 0.03 ng/mL (ref 0.00–0.06)

## 2010-10-05 LAB — NA AND K (SODIUM & POTASSIUM), RAND UR: Potassium Urine: 8 mEq/L

## 2010-10-05 LAB — URINE MICROSCOPIC-ADD ON

## 2010-11-17 ENCOUNTER — Other Ambulatory Visit: Payer: Self-pay | Admitting: Internal Medicine

## 2010-11-17 DIAGNOSIS — Z1231 Encounter for screening mammogram for malignant neoplasm of breast: Secondary | ICD-10-CM

## 2010-11-24 ENCOUNTER — Ambulatory Visit
Admission: RE | Admit: 2010-11-24 | Discharge: 2010-11-24 | Disposition: A | Discharge status: Home / Self Care | Payer: PRIVATE HEALTH INSURANCE | Source: Ambulatory Visit | Attending: Internal Medicine | Admitting: Internal Medicine

## 2010-11-24 DIAGNOSIS — Z1231 Encounter for screening mammogram for malignant neoplasm of breast: Secondary | ICD-10-CM

## 2010-12-03 NOTE — Discharge Summary (Signed)
Meredith Bender, Meredith Bender NO.:  [id number]   MEDICAL RECORD NO.:  [id number]          PATIENT TYPE:  INP   LOCATION:  0446                         [facility]ACILITY:  [facility]   PHYSICIAN:  Sandria Bales. Ezzard Standing, M.D.  DATE OF BIRTH:  [birth year]   DATE OF ADMISSION:  06/01/2004  DATE OF DISCHARGE:  06/11/2004                                 DISCHARGE SUMMARY   DISCHARGE DIAGNOSES:  1.  Rectal prolapse.  2.  Hypertension.  3.  Osteoporosis.  4.  Hypercholesterolemia.  5.  Gastroesophageal reflux disease.  6.  Postoperative confusion.   OPERATION PERFORMED:  The patient had a sigmoid colectomy with rectopexy on  June 02, 2004.   HISTORY OF ILLNESS:  This is a 77 year old white female who presents as a  patient of Dr. Kellie Shropshire and sees Dr. Vilinda Boehringer who has a rectal  prolapse.  Extensive discussion carried out with the patient and her family  regarding surgery for rectal prolapse, both the pros and cons.  She came the  day before surgery for completion of her bowel prep with planned surgery the  following day.   On November 16 she was taken to the operating room where she underwent a  sigmoid colectomy and rectopexy without problems.   Postoperatively, on November 17 her labs showed a hemoglobin of 12.8;  hematocrit 38; white blood cell count of 15,800; her glucose was 188; her  serum electrolytes were normal.   Her NG tube was left in until postoperative day #4 when it was removed.  She  was started on sips of clear liquids.  Her Foley was removed on  postoperative day #5.  She was noted to be mildly confused at nighttime.   However, by postoperative day #9, by November 25, she was alert, had had a  bowel movement, was doing well, was afebrile, her abdominal wound looked  good.   Discharge instructions include Darvocet for pain.  She is to resume her home  medication.  Her activity is as tolerated.  She is to see me back in 2-3  weeks for wound check.   Her final  pathology showed a colon segment which had benign mucosa with no  evidence of malignancy or atypia.  Her discharge condition was good.     Davi   DHN/MEDQ  D:  07/21/2004  T:  07/21/2004  Job:  [id number]   cc:   Merlene Laughter. Renae Gloss, M.D.  [address]  [address]  [place]  [place] [address]  Fax: [phone]   Llana Aliment. Malon Kindle., M.D.  [address], [address]  [place]  [place] [address]  Fax: [phone]

## 2010-12-03 NOTE — Op Note (Signed)
NAMEMAYELI, BORNHORST NO.:  [id number]   MEDICAL RECORD NO.:  [id number]          PATIENT TYPE:  INP   LOCATION:  0446                         [facility]ACILITY:  [facility]   PHYSICIAN:  Sandria Bales. Ezzard Standing, M.D.  DATE OF BIRTH:  12-Mar-1926   DATE OF PROCEDURE:  06/02/2004  DATE OF DISCHARGE:                                 OPERATIVE REPORT   PREOPERATIVE DIAGNOSIS:  Rectal prolapse.   POSTOPERATIVE DIAGNOSIS:  Rectal prolapse.   PROCEDURE:  Sigmoid colectomy with rectopexy.   SURGEON:  Sandria Bales. Ezzard Standing, M.D.   FIRST ASSISTANT:  Anselm Pancoast. Zachery Dakins, M.D.   ANESTHESIA:  General endotracheal.   ESTIMATED BLOOD LOSS:  150 cc.   DRAINS LEFT IN:  None.   INDICATIONS FOR PROCEDURE:  Ms. Melott is a 77 year old white female who had  presented with a rectal prolapse and now comes for attempted repair of this  rectal prolapse.  I discussed with her the indications and potential  complications of the surgery.  I told her there are both transrectal  procedures and transabdominal procedures, but I would perform a  transabdominal procedure.  Actually, I feel the best procedure for her is a  sigmoid colectomy with rectopexy.  Potential risks include bleeding,  infection, leak from the anastomosis, and failure of the surgery to prevent  recurrence of the prolapse.  She understands this.  She came into the  hospital a day early for mechanical and antibiotic bowel prep, and presents  to the operating room for surgery.   OPERATIVE NOTE:  Patient placed in a supine position.  Given a general  endotracheal anesthetic.  She had a Foley catheter in place and NG tube in  place, and is placed in the supine position.  Abdomen was prepped with  Betadine solution and sterilely draped.  I went through a low midline  incision with sharp dissection and carried the incision down to the  abdominal cavity.  Abdominal exploration revealed the right and left lobes  were unremarkable.  Small bowel was  unremarkable.  The colon,  I could  palpate no mass or lesion within the colon.  An NG tube was positioned in  the stomach in good position.   Attention was then turned toward the rectum.  She had a redundant sigmoid  colon with a very lax pelvic floor consistent with her diagnosis of rectal  prolapse.  Her uterus was absent, her appendix was absent, and she had a  Foley in her bladder.   I first divided the sigmoid colon with a 55 GI stapler to give me a handle  with which to work.  I then took the colon down to the presacral tissues and  basically did a low anterior resection going posteriorly all the way to the  coccyx.  I went laterally, trying to go down to the lateral pedicles without  dividing the lateral pedicles, and anteriorly took the bladder off the  anterior portion of the rectum, probably 6-7 cm.  This allowed me to pull  the entire rectum out of the pelvis.  I then divided the  colon immediately  above the peritoneal reflection.  This was where I did my primary  anastomosis.  I then divided the remainder of the sigmoid colon, taking out  about probably close to 2 feet of sigmoid colon.  I then did a handsewn  anastomosis using 2-0 silk sutures.  This was not under any tension, and the  anastomosis allowed at least 1-1/2 fingerbreadths into the lumen of the  bowel.  I then tacked some holding sutures on both sides.  The anastomosis  was created with internal sutures anteriorly.  I ended up doing [place]  sutures.  After the anastomosis was complete, I made sure there was no  tension on the bowel.  I rechecked the presacral space which was dry.  I did  put some Surgicel down there.  I then took three silk sutures, two on the  right side and one on the left side, and tacked the sutures to the presacral  fascia up to the proximal rectum below the anastomosis.  This tended to  straighten out the rectum, take it out of the sacral promontory, but put no  tension I did not think on  the anastomosis.   I then closed the peritoneum around the anastomosis, using some of the old  peritoneal reflection.  I used a 2-0 chromic suture for this.   I then irrigated the pelvis with 3 liters of saline.  I removed the [place].  I used the omentum to cover the lower part of the incision.  I then closed  the abdominal wound with two running #1 PDS sutures.  I closed the skin with  skin staples.   The patient tolerated the procedure well.  I felt I had taken out the  redundant sigmoid colon to try to straighten out the rectum as best as  possible with an appropriate pexy.  Her sponge and needle count were correct  at the end of the case.  She was transferred to the recovery room in good  condition.      Davi   DHN/MEDQ  D:  06/02/2004  T:  06/03/2004  Job:  [id number]   cc:   Merlene Laughter. Renae Gloss, M.D.  [address]  [address]  [place]  [place] [address]  Fax: [phone]   Llana Aliment. Malon Kindle., M.D.  [address], [address]  [place]  [place] [address]  Fax: [phone]

## 2011-05-27 ENCOUNTER — Emergency Department (INDEPENDENT_AMBULATORY_CARE_PROVIDER_SITE_OTHER)
Admission: EM | Admit: 2011-05-27 | Discharge: 2011-05-27 | Disposition: A | Discharge status: Home / Self Care | Payer: Medicare Other | Source: Home / Self Care

## 2011-05-27 ENCOUNTER — Encounter: Payer: Self-pay | Admitting: Emergency Medicine

## 2011-05-27 DIAGNOSIS — S91209A Unspecified open wound of unspecified toe(s) with damage to nail, initial encounter: Secondary | ICD-10-CM

## 2011-05-27 DIAGNOSIS — S91109A Unspecified open wound of unspecified toe(s) without damage to nail, initial encounter: Secondary | ICD-10-CM

## 2011-05-27 HISTORY — DX: Essential (primary) hypertension: I10

## 2011-05-27 NOTE — ED Provider Notes (Signed)
History     CSN: [id number] Arrival date & time: 05/27/2011 11:20 AM   First MD Initiated Contact with Patient 05/27/11 1230      Chief Complaint  Patient presents with  . Toe Injury    Pt states she "stubbed" her right first toe several days ago on gravestone, now has localized pain, redness, mild swelling where she sustained laceration laterally. no purulent drainage, fevers, parasthesias, redness extending beyond joint. Pain with walking.  Patient is a 77 y.o. female presenting with toe pain. The history is provided by the patient.  Toe Pain This is a new problem. The current episode started more than 2 days ago. The problem has not changed since onset.The symptoms are aggravated by walking. The symptoms are relieved by nothing. She has tried nothing for the symptoms. The treatment provided no relief.    Past Medical History  Diagnosis Date  . Hypertension     History reviewed. No pertinent past surgical history.  No family history on file.  History  Substance Use Topics  . Smoking status: Never Smoker   . Smokeless tobacco: Not on file  . Alcohol Use: No    OB History    Grav Para Term Preterm Abortions TAB SAB Ect Mult Living                  Review of Systems  Constitutional: Negative for fever.  Gastrointestinal: Negative for nausea and vomiting.  Musculoskeletal: Negative for joint swelling.  Skin: Positive for wound. Negative for rash.  Neurological: Negative for numbness.    Allergies  Codeine and Penicillins  Home Medications   Current Outpatient Rx  Name Route Sig Dispense Refill  . ATORVASTATIN CALCIUM 10 MG PO TABS Oral Take 10 mg by mouth daily.      . FUROSEMIDE 20 MG PO TABS Oral Take 20 mg by mouth 2 (two) times daily.      Marland Kitchen LORATADINE 10 MG PO TABS Oral Take 10 mg by mouth daily.      Marland Kitchen OMEPRAZOLE 20 MG PO CPDR Oral Take 20 mg by mouth daily.      Marland Kitchen POTASSIUM CHLORIDE 8 MEQ PO TBCR Oral Take 8 mEq by mouth daily.      . TRAMADOL HCL 50  MG PO TABS Oral Take 50 mg by mouth every 6 (six) hours as needed. Maximum dose= 8 tablets per day     . VERAPAMIL HCL 180 MG PO TBCR Oral Take 180 mg by mouth at bedtime.      Marland Kitchen VITAMIN E 400 UNITS PO CAPS Oral Take 400 Units by mouth daily.        BP 182/63  Pulse 76  Temp(Src) 97.9 F (36.6 C) (Oral)  Resp 16  SpO2 98%  Physical Exam  Nursing note and vitals reviewed. Constitutional: She is oriented to person, place, and time. She appears well-developed and well-nourished. No distress.  HENT:  Head: Normocephalic and atraumatic.  Eyes: EOM are normal. Pupils are equal, round, and reactive to light.  Neck: Normal range of motion.  Cardiovascular: Regular rhythm.   Pulmonary/Chest: Effort normal and breath sounds normal.  Abdominal: She exhibits no distension.  Musculoskeletal: Normal range of motion.  Neurological: She is alert and oriented to person, place, and time.  Skin: Skin is warm and dry. Abrasion noted.       Small flap toenail laterally, mild surrounding redness, tenderness with palpation no expressable drainage. FROM joint first right toe. No bony tenderness, deformity,.  Psychiatric: She has a normal mood and affect. Her behavior is normal. Judgment and thought content normal.    ED Course  Procedures (including critical care time) LACERATION REPAIR Performed by: Luiz Blare Consent: Verbal consent obtained. Risks and benefits: risks, benefits and alternatives were discussed Patient identity confirmed: provided demographic data Time out performed prior to procedure Prepped and Draped in normal sterile fashion Wound explored  Laceration Location: right first toe at lateral nailbed Laceration Length: 0.25  No Foreign Bodies seen or palpated  Anesthesia: topical Cleansed with alchohol, iodine. Removed broken nail. Applied bacitracin, sterile dressing.   Amount of cleaning: standard  Patient tolerance: Patient tolerated the procedure well with no  immediate complications. Labs Reviewed - No data to display No results found.   No diagnosis found.    MDM        Danella Maiers Kathye Cipriani 05/27/11 2220

## 2011-05-27 NOTE — ED Notes (Signed)
Pt comes in with left great toe injury with small abrasion at the corner after stubbing it on husbands grave stone Saturday.pt c/o pain only with pressure.no drainage seen.pt has small band aid intact.

## 2011-07-10 IMAGING — CT CT ABD-PELV W/O CM
2 of 4 series · 13 of 32 positions shown, 18 images · non-contrast
Comparison: 07/03/2010

CLINICAL DATA: Abdominal pain

CT ABDOMEN AND PELVIS WITHOUT CONTRAST
TECHNIQUE: Multidetector CT imaging of the abdomen and pelvis was
performed following the standard protocol without intravenous
contrast.

[Series 2: routine abdomen · axial · 0.70mm/px · z∈[-418,-113]mm · 7 of 83 slices shown, 12 images]
[im 11/83  soft-tissue]
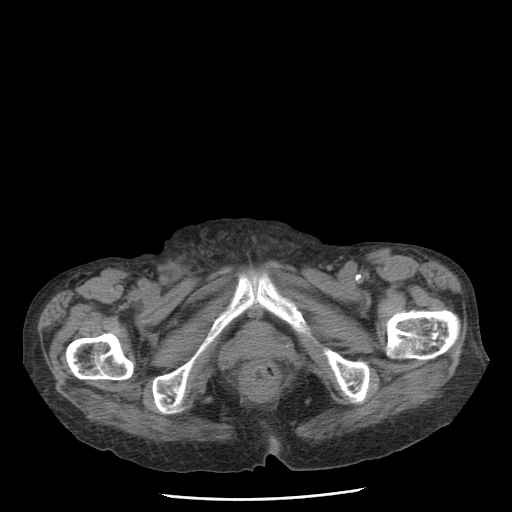
[im 11/83  bone]
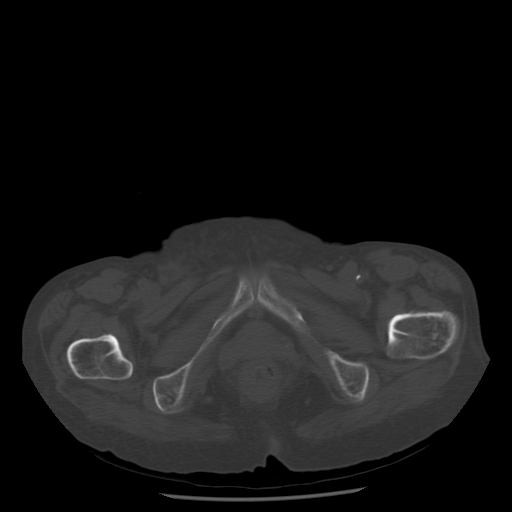
[im 21/83  soft-tissue]
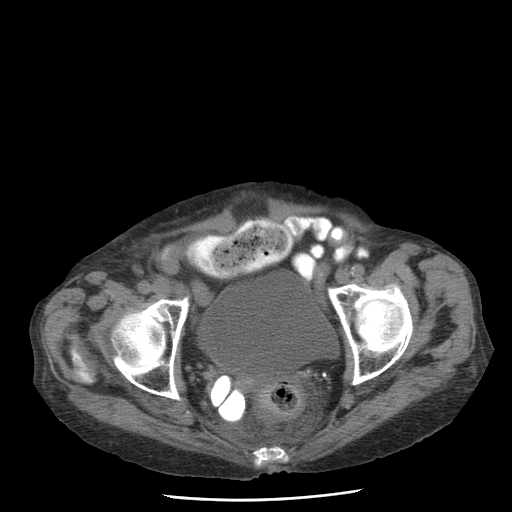
[im 31/83  soft-tissue]
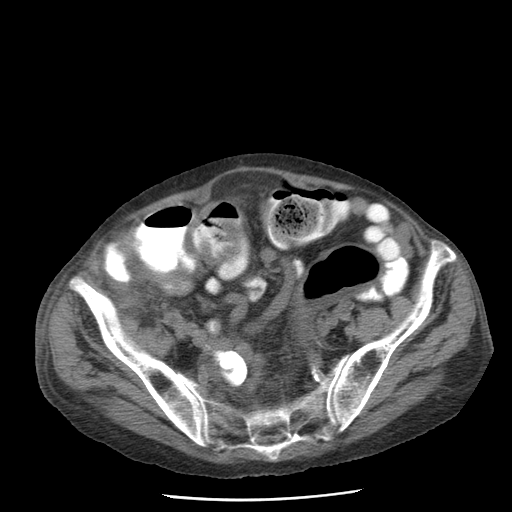
[im 42/83  soft-tissue]
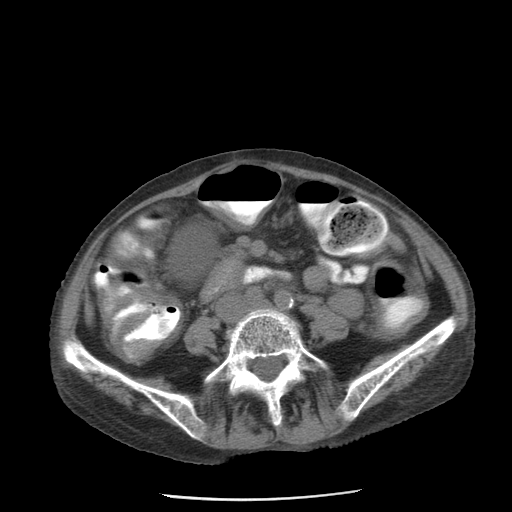
[im 42/83  lung]
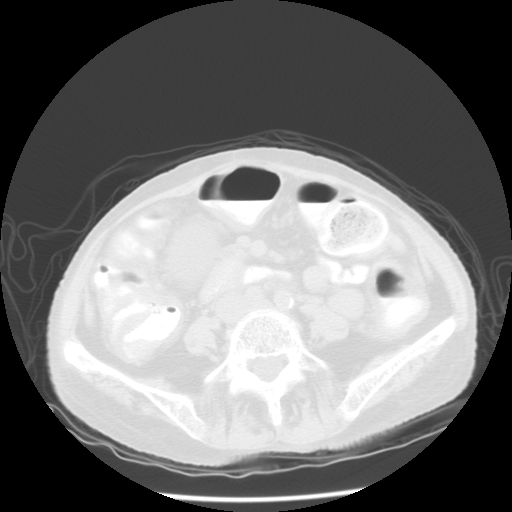
[im 52/83  soft-tissue]
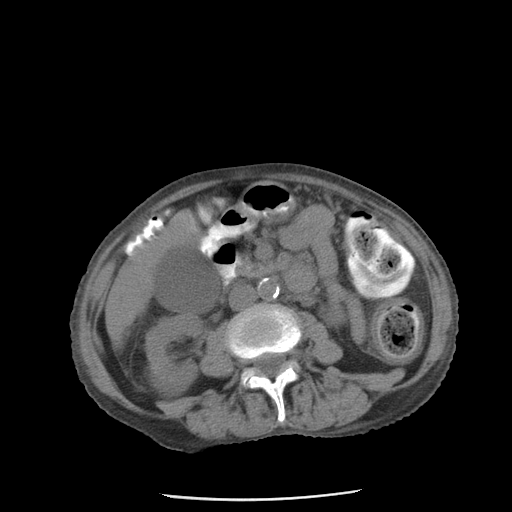
[im 52/83  lung]
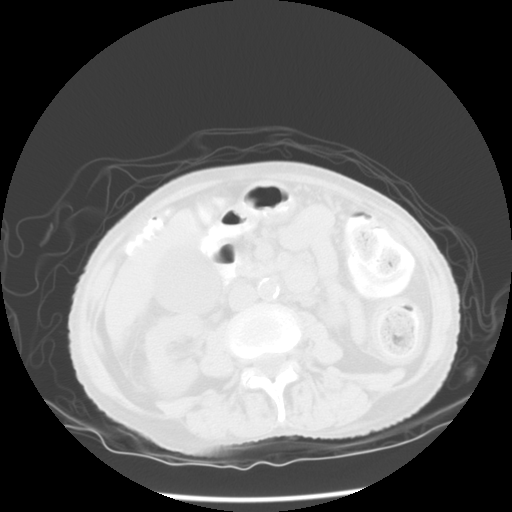
[im 62/83  soft-tissue]
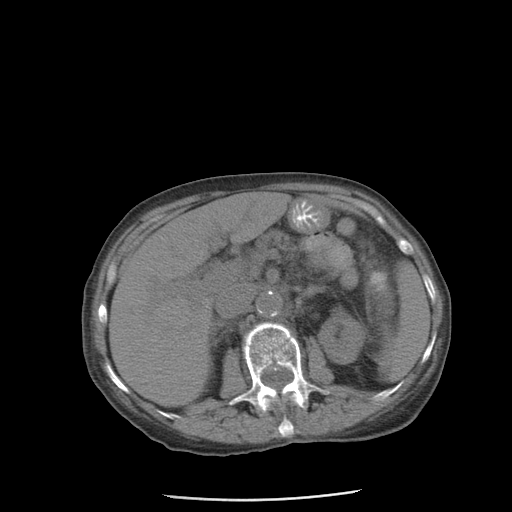
[im 62/83  lung]
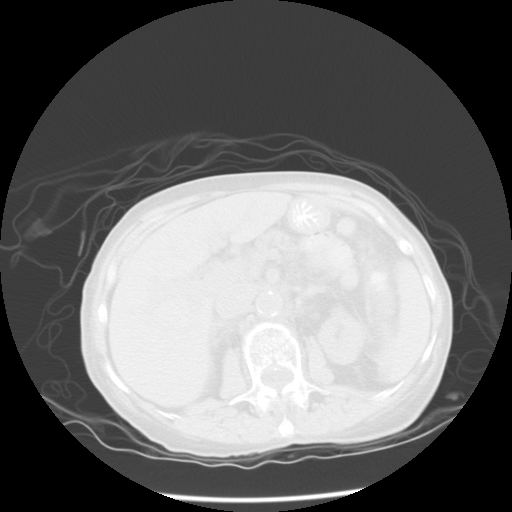
[im 72/83  soft-tissue]
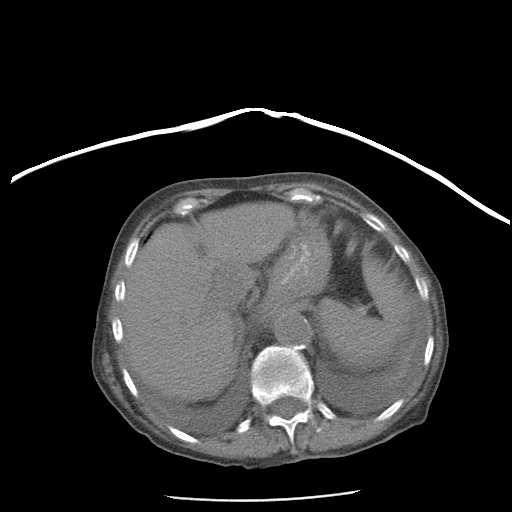
[im 72/83  lung]
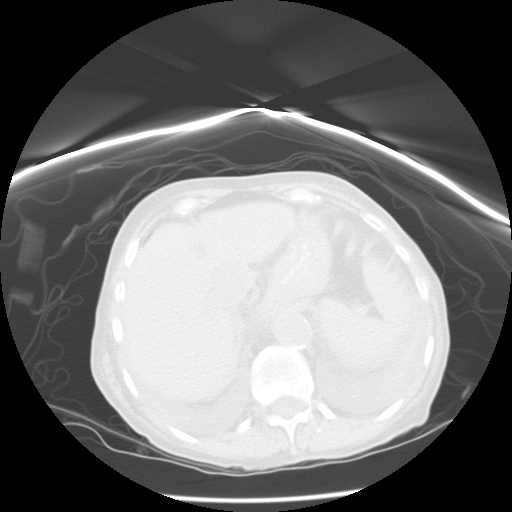

[Series 400: sag · sagittal · 0.82mm/px · 6 of 112 slices shown]
[im 11/112  soft-tissue]
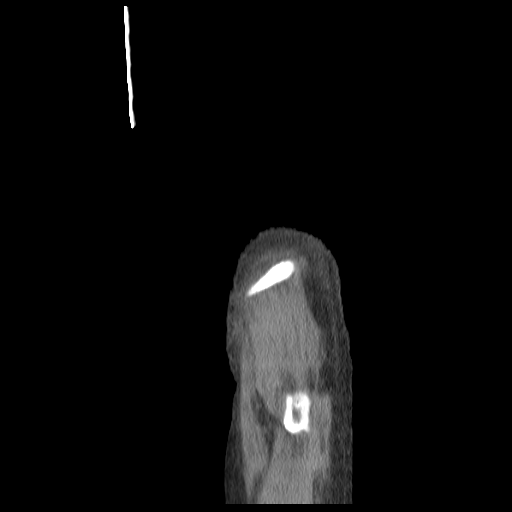
[im 21/112  soft-tissue]
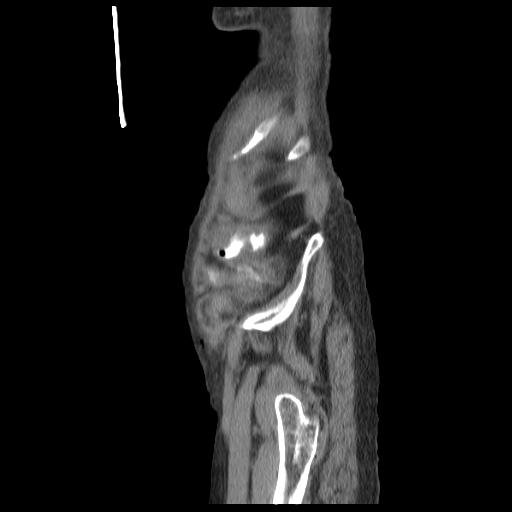
[im 41/112  soft-tissue]
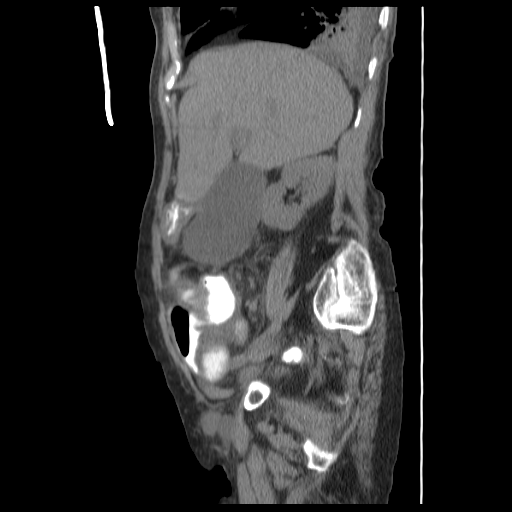
[im 51/112  soft-tissue]
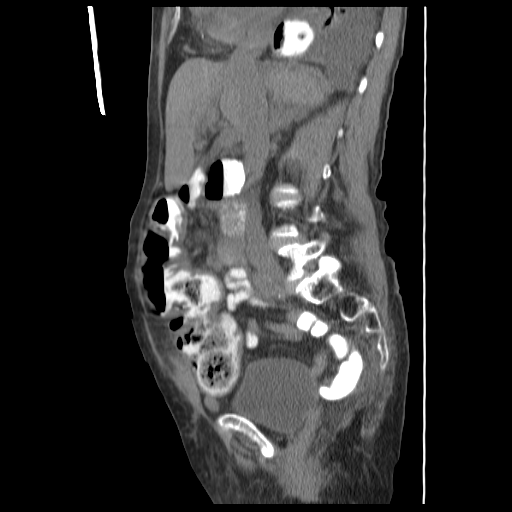
[im 61/112  soft-tissue]
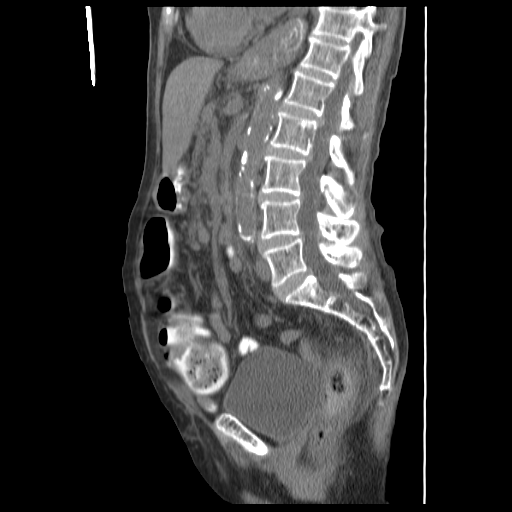
[im 71/112  soft-tissue]
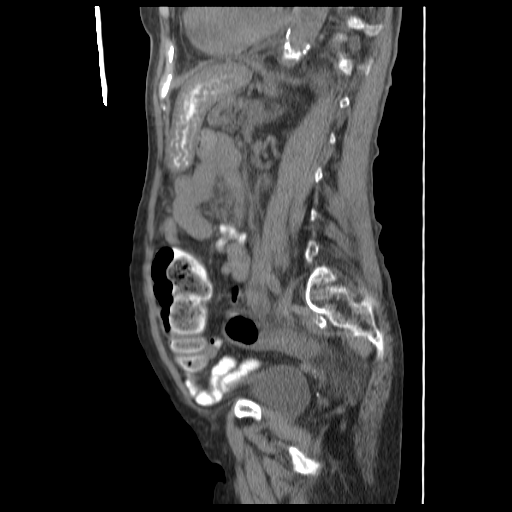

[13 of 32 positions shown; findings below may reference images not displayed]

FINDINGS: Increase in bilateral pleural effusions with adjacent atelectasis
and consolidation.  Large hiatal hernia is present.  Heart size
appears enlarged.

The spleen is negative.  Left adrenal gland nodule is unchanged.
The right adrenal nodule is normal in appearance

Distention of the gallbladder lumen.  No gallstones or
pericholecystic fluid noted.  There is no evidence for gallbladder
wall thickening.  No evidence for biliary dilatation.

There is no focal liver abnormalities identified.

The pancreas appears normal.

Right renal lobulations as before.  No right-sided nephrolithiasis
or obstructive uropathy.

There is mild atrophy of the left kidney compared with the right.
Within the inferior pole of the left kidney there is a
nonobstructing calculus measuring 5.5 mm.  This is unchanged from
previous exam.  There is no left-sided hydronephrosis identified.

There is no pathologically enlarged lymph nodes within the upper
abdomen.  There is  no pelvic adenopathy.  Within the right
inguinal region there are two enlarged lymph nodes.  These measure
up to 1.8 cm, image 70.  This is similar to the previous
examination.

No left inguinal adenopathy.

The stomach and the small bowel loops have a normal appearance.
There is diffuse wall thickening involving the colon and rectum.
This is stable to increased when compared with the previous
examination.  Pericolonic fat stranding and peri rectal fat
stranding appears increased from the previous examination.

There is no free fluid or abnormal fluid collections identified
within the abdomen or pelvis.

No specific features to suggest perforation or abscess.

Review of the visualized osseous structures is significant for
diffuse osteopenia and mild degenerative disc disease.
IMPRESSION: 4.  Stable to increase in wall thickening and inflammatory change
involving the colon and rectum.
2.  No evidence for bowel perforation or abscess formation.
3.  Persistent pathologically enlarged right inguinal lymph nodes.
These are nonspecific and remain indeterminate.  While these may be
reactive in etiology  I cannot rule out metastatic adenopathy or
lymphoma.  Consider further evaluation with right inguinal
ultrasound and possible fine needle aspiration/core biopsy.
4.  Increase in bilateral pleural effusions.

## 2011-07-29 DIAGNOSIS — R630 Anorexia: Secondary | ICD-10-CM | POA: Diagnosis not present

## 2011-08-31 DIAGNOSIS — L570 Actinic keratosis: Secondary | ICD-10-CM | POA: Diagnosis not present

## 2011-09-02 DIAGNOSIS — N184 Chronic kidney disease, stage 4 (severe): Secondary | ICD-10-CM | POA: Diagnosis not present

## 2011-09-02 DIAGNOSIS — Z79899 Other long term (current) drug therapy: Secondary | ICD-10-CM | POA: Diagnosis not present

## 2011-09-02 DIAGNOSIS — I10 Essential (primary) hypertension: Secondary | ICD-10-CM | POA: Diagnosis not present

## 2011-09-02 DIAGNOSIS — K644 Residual hemorrhoidal skin tags: Secondary | ICD-10-CM | POA: Diagnosis not present

## 2011-09-12 DIAGNOSIS — H52209 Unspecified astigmatism, unspecified eye: Secondary | ICD-10-CM | POA: Diagnosis not present

## 2011-09-12 DIAGNOSIS — H2640 Unspecified secondary cataract: Secondary | ICD-10-CM | POA: Diagnosis not present

## 2011-09-12 DIAGNOSIS — Z961 Presence of intraocular lens: Secondary | ICD-10-CM | POA: Diagnosis not present

## 2011-09-20 DIAGNOSIS — M2040 Other hammer toe(s) (acquired), unspecified foot: Secondary | ICD-10-CM | POA: Diagnosis not present

## 2011-09-29 DIAGNOSIS — H26499 Other secondary cataract, unspecified eye: Secondary | ICD-10-CM | POA: Diagnosis not present

## 2011-09-29 DIAGNOSIS — H2640 Unspecified secondary cataract: Secondary | ICD-10-CM | POA: Diagnosis not present

## 2011-10-10 DIAGNOSIS — M130 Polyarthritis, unspecified: Secondary | ICD-10-CM | POA: Diagnosis not present

## 2011-10-10 DIAGNOSIS — D518 Other vitamin B12 deficiency anemias: Secondary | ICD-10-CM | POA: Diagnosis not present

## 2011-10-11 DIAGNOSIS — D518 Other vitamin B12 deficiency anemias: Secondary | ICD-10-CM | POA: Diagnosis not present

## 2011-10-11 DIAGNOSIS — M130 Polyarthritis, unspecified: Secondary | ICD-10-CM | POA: Diagnosis not present

## 2011-10-12 DIAGNOSIS — L570 Actinic keratosis: Secondary | ICD-10-CM | POA: Diagnosis not present

## 2011-10-12 DIAGNOSIS — D485 Neoplasm of uncertain behavior of skin: Secondary | ICD-10-CM | POA: Diagnosis not present

## 2011-11-10 DIAGNOSIS — E785 Hyperlipidemia, unspecified: Secondary | ICD-10-CM | POA: Diagnosis not present

## 2011-11-10 DIAGNOSIS — E559 Vitamin D deficiency, unspecified: Secondary | ICD-10-CM | POA: Diagnosis not present

## 2011-11-10 DIAGNOSIS — I10 Essential (primary) hypertension: Secondary | ICD-10-CM | POA: Diagnosis not present

## 2011-11-10 DIAGNOSIS — M130 Polyarthritis, unspecified: Secondary | ICD-10-CM | POA: Diagnosis not present

## 2011-11-10 DIAGNOSIS — M810 Age-related osteoporosis without current pathological fracture: Secondary | ICD-10-CM | POA: Diagnosis not present

## 2011-11-10 DIAGNOSIS — M549 Dorsalgia, unspecified: Secondary | ICD-10-CM | POA: Diagnosis not present

## 2011-11-10 DIAGNOSIS — I129 Hypertensive chronic kidney disease with stage 1 through stage 4 chronic kidney disease, or unspecified chronic kidney disease: Secondary | ICD-10-CM | POA: Diagnosis not present

## 2011-11-10 DIAGNOSIS — Z0000 Encounter for general adult medical examination without abnormal findings: Secondary | ICD-10-CM | POA: Diagnosis not present

## 2011-11-10 DIAGNOSIS — D518 Other vitamin B12 deficiency anemias: Secondary | ICD-10-CM | POA: Diagnosis not present

## 2011-12-08 ENCOUNTER — Other Ambulatory Visit: Payer: Self-pay | Admitting: Internal Medicine

## 2011-12-08 DIAGNOSIS — Z1231 Encounter for screening mammogram for malignant neoplasm of breast: Secondary | ICD-10-CM

## 2011-12-26 ENCOUNTER — Ambulatory Visit
Admission: RE | Admit: 2011-12-26 | Discharge: 2011-12-26 | Disposition: A | Discharge status: Home / Self Care | Payer: PRIVATE HEALTH INSURANCE | Source: Ambulatory Visit | Attending: Internal Medicine | Admitting: Internal Medicine

## 2011-12-26 DIAGNOSIS — Z1231 Encounter for screening mammogram for malignant neoplasm of breast: Secondary | ICD-10-CM | POA: Diagnosis not present

## 2012-01-11 DIAGNOSIS — R69 Illness, unspecified: Secondary | ICD-10-CM | POA: Diagnosis not present

## 2012-01-11 DIAGNOSIS — L570 Actinic keratosis: Secondary | ICD-10-CM | POA: Diagnosis not present

## 2012-03-05 DIAGNOSIS — R5381 Other malaise: Secondary | ICD-10-CM | POA: Diagnosis not present

## 2012-03-05 DIAGNOSIS — E559 Vitamin D deficiency, unspecified: Secondary | ICD-10-CM | POA: Diagnosis not present

## 2012-03-05 DIAGNOSIS — Z79899 Other long term (current) drug therapy: Secondary | ICD-10-CM | POA: Diagnosis not present

## 2012-03-05 DIAGNOSIS — I10 Essential (primary) hypertension: Secondary | ICD-10-CM | POA: Diagnosis not present

## 2012-03-15 DIAGNOSIS — D518 Other vitamin B12 deficiency anemias: Secondary | ICD-10-CM | POA: Diagnosis not present

## 2012-03-15 DIAGNOSIS — I10 Essential (primary) hypertension: Secondary | ICD-10-CM | POA: Diagnosis not present

## 2012-03-21 DIAGNOSIS — D518 Other vitamin B12 deficiency anemias: Secondary | ICD-10-CM | POA: Diagnosis not present

## 2012-04-19 DIAGNOSIS — Z23 Encounter for immunization: Secondary | ICD-10-CM | POA: Diagnosis not present

## 2012-05-17 DIAGNOSIS — N184 Chronic kidney disease, stage 4 (severe): Secondary | ICD-10-CM | POA: Diagnosis not present

## 2012-05-17 DIAGNOSIS — D518 Other vitamin B12 deficiency anemias: Secondary | ICD-10-CM | POA: Diagnosis not present

## 2012-05-17 DIAGNOSIS — Z79899 Other long term (current) drug therapy: Secondary | ICD-10-CM | POA: Diagnosis not present

## 2012-05-17 DIAGNOSIS — I10 Essential (primary) hypertension: Secondary | ICD-10-CM | POA: Diagnosis not present

## 2012-05-17 DIAGNOSIS — I129 Hypertensive chronic kidney disease with stage 1 through stage 4 chronic kidney disease, or unspecified chronic kidney disease: Secondary | ICD-10-CM | POA: Diagnosis not present

## 2012-05-17 DIAGNOSIS — E785 Hyperlipidemia, unspecified: Secondary | ICD-10-CM | POA: Diagnosis not present

## 2012-05-17 DIAGNOSIS — Z23 Encounter for immunization: Secondary | ICD-10-CM | POA: Diagnosis not present

## 2012-07-31 DIAGNOSIS — B070 Plantar wart: Secondary | ICD-10-CM | POA: Diagnosis not present

## 2012-07-31 DIAGNOSIS — L570 Actinic keratosis: Secondary | ICD-10-CM | POA: Diagnosis not present

## 2012-08-27 DIAGNOSIS — N390 Urinary tract infection, site not specified: Secondary | ICD-10-CM | POA: Diagnosis not present

## 2012-08-27 DIAGNOSIS — M1990 Unspecified osteoarthritis, unspecified site: Secondary | ICD-10-CM | POA: Diagnosis not present

## 2012-10-19 DIAGNOSIS — H52209 Unspecified astigmatism, unspecified eye: Secondary | ICD-10-CM | POA: Diagnosis not present

## 2012-10-19 DIAGNOSIS — Z961 Presence of intraocular lens: Secondary | ICD-10-CM | POA: Diagnosis not present

## 2012-11-26 DIAGNOSIS — I10 Essential (primary) hypertension: Secondary | ICD-10-CM | POA: Diagnosis not present

## 2012-11-26 DIAGNOSIS — N390 Urinary tract infection, site not specified: Secondary | ICD-10-CM | POA: Diagnosis not present

## 2012-11-26 DIAGNOSIS — Z79899 Other long term (current) drug therapy: Secondary | ICD-10-CM | POA: Diagnosis not present

## 2012-11-26 DIAGNOSIS — M1990 Unspecified osteoarthritis, unspecified site: Secondary | ICD-10-CM | POA: Diagnosis not present

## 2012-12-17 DIAGNOSIS — L259 Unspecified contact dermatitis, unspecified cause: Secondary | ICD-10-CM | POA: Diagnosis not present

## 2013-01-03 DIAGNOSIS — Z0000 Encounter for general adult medical examination without abnormal findings: Secondary | ICD-10-CM | POA: Diagnosis not present

## 2013-01-03 DIAGNOSIS — E559 Vitamin D deficiency, unspecified: Secondary | ICD-10-CM | POA: Diagnosis not present

## 2013-01-03 DIAGNOSIS — D518 Other vitamin B12 deficiency anemias: Secondary | ICD-10-CM | POA: Diagnosis not present

## 2013-01-03 DIAGNOSIS — I129 Hypertensive chronic kidney disease with stage 1 through stage 4 chronic kidney disease, or unspecified chronic kidney disease: Secondary | ICD-10-CM | POA: Diagnosis not present

## 2013-01-03 DIAGNOSIS — E785 Hyperlipidemia, unspecified: Secondary | ICD-10-CM | POA: Diagnosis not present

## 2013-01-03 DIAGNOSIS — R7309 Other abnormal glucose: Secondary | ICD-10-CM | POA: Diagnosis not present

## 2013-01-03 DIAGNOSIS — M1990 Unspecified osteoarthritis, unspecified site: Secondary | ICD-10-CM | POA: Diagnosis not present

## 2013-01-03 DIAGNOSIS — I10 Essential (primary) hypertension: Secondary | ICD-10-CM | POA: Diagnosis not present

## 2013-01-03 DIAGNOSIS — N184 Chronic kidney disease, stage 4 (severe): Secondary | ICD-10-CM | POA: Diagnosis not present

## 2013-01-24 DIAGNOSIS — L821 Other seborrheic keratosis: Secondary | ICD-10-CM | POA: Diagnosis not present

## 2013-01-24 DIAGNOSIS — L570 Actinic keratosis: Secondary | ICD-10-CM | POA: Diagnosis not present

## 2013-02-01 DIAGNOSIS — N390 Urinary tract infection, site not specified: Secondary | ICD-10-CM | POA: Diagnosis not present

## 2013-02-01 DIAGNOSIS — M2550 Pain in unspecified joint: Secondary | ICD-10-CM | POA: Diagnosis not present

## 2013-02-01 DIAGNOSIS — I10 Essential (primary) hypertension: Secondary | ICD-10-CM | POA: Diagnosis not present

## 2013-02-19 DIAGNOSIS — D518 Other vitamin B12 deficiency anemias: Secondary | ICD-10-CM | POA: Diagnosis not present

## 2013-02-19 DIAGNOSIS — N390 Urinary tract infection, site not specified: Secondary | ICD-10-CM | POA: Diagnosis not present

## 2013-02-19 DIAGNOSIS — R5381 Other malaise: Secondary | ICD-10-CM | POA: Diagnosis not present

## 2013-02-19 DIAGNOSIS — R5383 Other fatigue: Secondary | ICD-10-CM | POA: Diagnosis not present

## 2013-02-19 DIAGNOSIS — I10 Essential (primary) hypertension: Secondary | ICD-10-CM | POA: Diagnosis not present

## 2013-02-19 DIAGNOSIS — E559 Vitamin D deficiency, unspecified: Secondary | ICD-10-CM | POA: Diagnosis not present

## 2013-02-19 DIAGNOSIS — Z79899 Other long term (current) drug therapy: Secondary | ICD-10-CM | POA: Diagnosis not present

## 2013-02-26 DIAGNOSIS — Z79899 Other long term (current) drug therapy: Secondary | ICD-10-CM | POA: Diagnosis not present

## 2013-02-26 DIAGNOSIS — I129 Hypertensive chronic kidney disease with stage 1 through stage 4 chronic kidney disease, or unspecified chronic kidney disease: Secondary | ICD-10-CM | POA: Diagnosis not present

## 2013-02-26 DIAGNOSIS — N184 Chronic kidney disease, stage 4 (severe): Secondary | ICD-10-CM | POA: Diagnosis not present

## 2013-02-26 DIAGNOSIS — D518 Other vitamin B12 deficiency anemias: Secondary | ICD-10-CM | POA: Diagnosis not present

## 2013-02-28 ENCOUNTER — Encounter (HOSPITAL_COMMUNITY): Payer: Self-pay | Admitting: *Deleted

## 2013-02-28 ENCOUNTER — Inpatient Hospital Stay (HOSPITAL_COMMUNITY)
Admission: EM | Admit: 2013-02-28 | Discharge: 2013-03-02 | DRG: 690 | Disposition: A | Discharge status: Home Health | Payer: Medicare Other | Attending: Internal Medicine | Admitting: Internal Medicine

## 2013-02-28 ENCOUNTER — Emergency Department (HOSPITAL_COMMUNITY): Payer: Medicare Other

## 2013-02-28 ENCOUNTER — Inpatient Hospital Stay (HOSPITAL_COMMUNITY): Payer: Medicare Other

## 2013-02-28 DIAGNOSIS — J4489 Other specified chronic obstructive pulmonary disease: Secondary | ICD-10-CM | POA: Diagnosis present

## 2013-02-28 DIAGNOSIS — N184 Chronic kidney disease, stage 4 (severe): Secondary | ICD-10-CM | POA: Diagnosis present

## 2013-02-28 DIAGNOSIS — Z880 Allergy status to penicillin: Secondary | ICD-10-CM | POA: Diagnosis not present

## 2013-02-28 DIAGNOSIS — N10 Acute tubulo-interstitial nephritis: Secondary | ICD-10-CM | POA: Diagnosis not present

## 2013-02-28 DIAGNOSIS — I160 Hypertensive urgency: Secondary | ICD-10-CM

## 2013-02-28 DIAGNOSIS — E785 Hyperlipidemia, unspecified: Secondary | ICD-10-CM | POA: Diagnosis not present

## 2013-02-28 DIAGNOSIS — N390 Urinary tract infection, site not specified: Secondary | ICD-10-CM | POA: Diagnosis not present

## 2013-02-28 DIAGNOSIS — K219 Gastro-esophageal reflux disease without esophagitis: Secondary | ICD-10-CM | POA: Diagnosis present

## 2013-02-28 DIAGNOSIS — N289 Disorder of kidney and ureter, unspecified: Secondary | ICD-10-CM | POA: Diagnosis present

## 2013-02-28 DIAGNOSIS — Z8673 Personal history of transient ischemic attack (TIA), and cerebral infarction without residual deficits: Secondary | ICD-10-CM | POA: Diagnosis not present

## 2013-02-28 DIAGNOSIS — I10 Essential (primary) hypertension: Secondary | ICD-10-CM | POA: Diagnosis not present

## 2013-02-28 DIAGNOSIS — I129 Hypertensive chronic kidney disease with stage 1 through stage 4 chronic kidney disease, or unspecified chronic kidney disease: Secondary | ICD-10-CM | POA: Diagnosis present

## 2013-02-28 DIAGNOSIS — J449 Chronic obstructive pulmonary disease, unspecified: Secondary | ICD-10-CM | POA: Diagnosis present

## 2013-02-28 DIAGNOSIS — I639 Cerebral infarction, unspecified: Secondary | ICD-10-CM | POA: Diagnosis present

## 2013-02-28 DIAGNOSIS — I059 Rheumatic mitral valve disease, unspecified: Secondary | ICD-10-CM | POA: Diagnosis not present

## 2013-02-28 DIAGNOSIS — R51 Headache: Secondary | ICD-10-CM | POA: Diagnosis not present

## 2013-02-28 DIAGNOSIS — Z79899 Other long term (current) drug therapy: Secondary | ICD-10-CM | POA: Diagnosis not present

## 2013-02-28 DIAGNOSIS — N200 Calculus of kidney: Secondary | ICD-10-CM | POA: Diagnosis not present

## 2013-02-28 HISTORY — DX: Hyperlipidemia, unspecified: E78.5

## 2013-02-28 HISTORY — DX: Cerebral infarction, unspecified: I63.9

## 2013-02-28 HISTORY — DX: Chronic kidney disease, unspecified: N18.9

## 2013-02-28 HISTORY — DX: Unspecified osteoarthritis, unspecified site: M19.90

## 2013-02-28 HISTORY — DX: Angina pectoris, unspecified: I20.9

## 2013-02-28 HISTORY — DX: Chronic obstructive pulmonary disease, unspecified: J44.9

## 2013-02-28 LAB — CBC
Hemoglobin: 13.8 g/dL (ref 12.0–15.0)
Hemoglobin: 12.8 g/dL (ref 12.0–15.0)
MCH: 27.4 pg (ref 26.0–34.0)
MCHC: 33.6 g/dL (ref 30.0–36.0)
MCHC: 32.3 g/dL (ref 30.0–36.0)
Platelets: 251 10*3/uL (ref 150–400)
RDW: 14.3 % (ref 11.5–15.5)
RDW: 14.2 % (ref 11.5–15.5)

## 2013-02-28 LAB — URINE MICROSCOPIC-ADD ON

## 2013-02-28 LAB — URINALYSIS, ROUTINE W REFLEX MICROSCOPIC
Glucose, UA: NEGATIVE mg/dL
Nitrite: NEGATIVE
Protein, ur: >300 mg/dL — AB
Urobilinogen, UA: 0.2 mg/dL (ref 0.0–1.0)

## 2013-02-28 LAB — COMPREHENSIVE METABOLIC PANEL
ALT: 12 U/L (ref 0–35)
CO2: 20 mEq/L (ref 19–32)
Calcium: 9.5 mg/dL (ref 8.4–10.5)
GFR calc Af Amer: 27 mL/min — ABNORMAL LOW (ref >90)
GFR calc non Af Amer: 23 mL/min — ABNORMAL LOW (ref >90)
Glucose, Bld: 121 mg/dL — ABNORMAL HIGH (ref 70–99)
Sodium: 138 mEq/L (ref 135–145)

## 2013-02-28 LAB — CREATININE, SERUM
Creatinine, Ser: 1.83 mg/dL — ABNORMAL HIGH (ref 0.50–1.10)
GFR calc non Af Amer: 24 mL/min — ABNORMAL LOW (ref >90)

## 2013-02-28 MED ORDER — SODIUM CHLORIDE 0.9 % IV SOLN
INTRAVENOUS | Status: DC
  Administered 2013-02-28: 22:00:00 via INTRAVENOUS

## 2013-02-28 MED ORDER — SODIUM CHLORIDE 0.9 % IJ SOLN
3.0000 mL | Freq: Two times a day (BID) | INTRAMUSCULAR | Status: DC
  Administered 2013-02-28 – 2013-03-02 (×4): 3 mL via INTRAVENOUS

## 2013-02-28 MED ORDER — ONDANSETRON HCL 4 MG/2ML IJ SOLN: 4.0000 mg | Freq: Four times a day (QID) | INTRAMUSCULAR | Status: DC | PRN

## 2013-02-28 MED ORDER — ALUM & MAG HYDROXIDE-SIMETH 200-200-20 MG/5ML PO SUSP: 30.0000 mL | Freq: Four times a day (QID) | ORAL | Status: DC | PRN

## 2013-02-28 MED ORDER — CLONIDINE HCL 0.1 MG PO TABS
0.1000 mg | ORAL_TABLET | Freq: Two times a day (BID) | ORAL | Status: DC
  Administered 2013-02-28 – 2013-03-02 (×4): 0.1 mg via ORAL
  Filled 2013-02-28 (×5): qty 1

## 2013-02-28 MED ORDER — PANTOPRAZOLE SODIUM 40 MG PO TBEC
40.0000 mg | DELAYED_RELEASE_TABLET | Freq: Every day | ORAL | Status: DC
  Administered 2013-03-01 – 2013-03-02 (×2): 40 mg via ORAL
  Filled 2013-02-28 (×2): qty 1

## 2013-02-28 MED ORDER — TRAMADOL HCL 50 MG PO TABS
50.0000 mg | ORAL_TABLET | Freq: Once | ORAL | Status: AC
  Administered 2013-02-28: 50 mg via ORAL
  Filled 2013-02-28: qty 1

## 2013-02-28 MED ORDER — LORATADINE 10 MG PO TABS
10.0000 mg | ORAL_TABLET | Freq: Every day | ORAL | Status: DC
  Administered 2013-03-01 – 2013-03-02 (×2): 10 mg via ORAL
  Filled 2013-02-28 (×2): qty 1

## 2013-02-28 MED ORDER — SODIUM CHLORIDE 0.9 % IV BOLUS (SEPSIS)
500.0000 mL | Freq: Once | INTRAVENOUS | Status: AC
  Administered 2013-02-28: 500 mL via INTRAVENOUS

## 2013-02-28 MED ORDER — HYDROCODONE-ACETAMINOPHEN 5-325 MG PO TABS
1.0000 | ORAL_TABLET | ORAL | Status: DC | PRN
  Administered 2013-02-28: 2 via ORAL
  Filled 2013-02-28 (×2): qty 2

## 2013-02-28 MED ORDER — IBUPROFEN 400 MG PO TABS
400.0000 mg | ORAL_TABLET | Freq: Four times a day (QID) | ORAL | Status: DC | PRN
  Administered 2013-02-28 – 2013-03-01 (×2): 400 mg via ORAL
  Filled 2013-02-28 (×3): qty 1

## 2013-02-28 MED ORDER — ONDANSETRON HCL 4 MG PO TABS: 4.0000 mg | ORAL_TABLET | Freq: Four times a day (QID) | ORAL | Status: DC | PRN

## 2013-02-28 MED ORDER — ALBUTEROL SULFATE (5 MG/ML) 0.5% IN NEBU: 2.5000 mg | INHALATION_SOLUTION | RESPIRATORY_TRACT | Status: DC | PRN

## 2013-02-28 MED ORDER — GUAIFENESIN-DM 100-10 MG/5ML PO SYRP: 5.0000 mL | ORAL_SOLUTION | ORAL | Status: DC | PRN

## 2013-02-28 MED ORDER — OXYCODONE-ACETAMINOPHEN 5-325 MG PO TABS
1.0000 | ORAL_TABLET | Freq: Once | ORAL | Status: AC
  Administered 2013-02-28: 1 via ORAL
  Filled 2013-02-28: qty 1

## 2013-02-28 MED ORDER — ATORVASTATIN CALCIUM 10 MG PO TABS
10.0000 mg | ORAL_TABLET | Freq: Every day | ORAL | Status: DC
  Administered 2013-02-28 – 2013-03-02 (×3): 10 mg via ORAL
  Filled 2013-02-28 (×3): qty 1

## 2013-02-28 MED ORDER — LORAZEPAM 2 MG/ML IJ SOLN: 0.5000 mg | Freq: Once | INTRAMUSCULAR | Status: AC | PRN

## 2013-02-28 MED ORDER — HYDRALAZINE HCL 25 MG PO TABS
25.0000 mg | ORAL_TABLET | Freq: Once | ORAL | Status: AC
  Administered 2013-02-28: 25 mg via ORAL
  Filled 2013-02-28: qty 1

## 2013-02-28 MED ORDER — DEXTROSE 5 % IV SOLN
1.0000 g | INTRAVENOUS | Status: DC
  Administered 2013-03-01: 1 g via INTRAVENOUS
  Filled 2013-02-28 (×4): qty 10

## 2013-02-28 MED ORDER — HYDRALAZINE HCL 25 MG PO TABS
25.0000 mg | ORAL_TABLET | Freq: Three times a day (TID) | ORAL | Status: DC
  Administered 2013-02-28 – 2013-03-02 (×5): 25 mg via ORAL
  Filled 2013-02-28 (×7): qty 1

## 2013-02-28 MED ORDER — POLYETHYLENE GLYCOL 3350 17 G PO PACK
17.0000 g | PACK | Freq: Every day | ORAL | Status: DC | PRN
  Filled 2013-02-28: qty 1

## 2013-02-28 MED ORDER — DEXTROSE 5 % IV SOLN
1.0000 g | Freq: Once | INTRAVENOUS | Status: AC
  Administered 2013-02-28: 1 g via INTRAVENOUS
  Filled 2013-02-28: qty 10

## 2013-02-28 MED ORDER — HEPARIN SODIUM (PORCINE) 5000 UNIT/ML IJ SOLN
5000.0000 [IU] | Freq: Three times a day (TID) | INTRAMUSCULAR | Status: DC
  Administered 2013-02-28 – 2013-03-02 (×5): 5000 [IU] via SUBCUTANEOUS
  Filled 2013-02-28 (×8): qty 1

## 2013-02-28 MED ORDER — VERAPAMIL HCL ER 180 MG PO TBCR
180.0000 mg | EXTENDED_RELEASE_TABLET | Freq: Every day | ORAL | Status: DC
  Administered 2013-03-01: 180 mg via ORAL
  Filled 2013-02-28 (×2): qty 1

## 2013-02-28 NOTE — ED Provider Notes (Signed)
I saw and evaluated the patient, reviewed the resident's note and I agree with the findings and plan.   Patient here after failing outpatient treatment for UTI as well is having increasing trouble controlling her blood pressures. Patient started on Rocephin and will consult [facility] hospitalist for admission  Toy Baker, MD 02/28/13 1631

## 2013-02-28 NOTE — ED Notes (Signed)
To ED for eval of HTN. Seen at [facility] last week and had added bp med. Seen again this week for recheck, bp still high, so they increased new med to 3 times a day. Also has had recent dx with UTI with treatment of cipro (2 wks ago). Pt is still running intermittent fevers.

## 2013-02-28 NOTE — ED Provider Notes (Signed)
CSN: [id number]     Arrival date & time 02/28/13  1154 History     First MD Initiated Contact with Patient 02/28/13 1347     Chief Complaint  Patient presents with  . Hypertension  . Urinary Tract Infection  . Weakness   (Consider location/radiation/quality/duration/timing/severity/associated sxs/prior Treatment) HPI 79 year old female with PMH of HTN who presents with HTN, recent UTI, and weakness.   Patient and family present for history. Per patient and family, she has a recent UTI approximately 2 weeks ago and finished a course of cipro.  During the past 2 weeks (despite therapy) she has been feeling poorly and has intermittent fevers, with associated vomiting x 1.  She has also had elevated BP during this time.  She has been following up closely with her Primary doctor.  Reportedly, her PCP has recently started her on Hydralazine and has been increasing this.  Family endorses that she had a negative repeat UA.  Currently, Meredith Bender reports that she has a severe frontal headache.  She denies dysuria, flank pain, chest pain, SOB, n/v, abdominal pain.  She endorses recent fever, bilateral knee pain.   Past Medical History  Diagnosis Date  . Hypertension    History reviewed. No pertinent past surgical history. History reviewed. No pertinent family history. History  Substance Use Topics  . Smoking status: Never Smoker   . Smokeless tobacco: Not on file  . Alcohol Use: No   OB History   Grav Para Term Preterm Abortions TAB SAB Ect Mult Living                 Review of Systems  Constitutional: Positive for fever.  Respiratory: Negative for chest tightness and shortness of breath.   Cardiovascular: Negative for chest pain.  Gastrointestinal: Positive for nausea and vomiting. Negative for abdominal pain.  Genitourinary: Negative for dysuria and difficulty urinating.  Musculoskeletal: Positive for myalgias and arthralgias.  Skin: Negative for rash.  Neurological: Positive  for weakness.   Allergies  Codeine and Penicillins  Home Medications   Current Outpatient Rx  Name  Route  Sig  Dispense  Refill  . acetaminophen (TYLENOL) 325 MG tablet   Oral   Take 325 mg by mouth every 6 (six) hours as needed for pain.         Marland Kitchen atorvastatin (LIPITOR) 10 MG tablet   Oral   Take 10 mg by mouth daily.           . Cholecalciferol (VITAMIN D PO)   Oral   Take 1 tablet by mouth daily.         . hydrALAZINE (APRESOLINE) 25 MG tablet   Oral   Take 25 mg by mouth 3 (three) times daily.         Marland Kitchen loratadine (CLARITIN) 10 MG tablet   Oral   Take 10 mg by mouth daily.           Marland Kitchen omeprazole (PRILOSEC) 20 MG capsule   Oral   Take 20 mg by mouth daily.           . verapamil (CALAN-SR) 180 MG CR tablet   Oral   Take 180 mg by mouth at bedtime.          . vitamin E (VITAMIN E) 400 UNIT capsule   Oral   Take 400 Units by mouth daily.            BP 189/66  Pulse 79  Temp(Src) 98 F (  36.7 C) (Oral)  Resp 26  SpO2 96% Physical Exam  Constitutional: She appears well-developed and well-nourished.  HENT:  Head: Normocephalic and atraumatic.  Eyes: EOM are normal. Pupils are equal, round, and reactive to light. No scleral icterus.  Neck: Neck supple.  Cardiovascular: Normal rate and regular rhythm.   No murmur heard. Pulmonary/Chest: Effort normal and breath sounds normal. She has no wheezes. She has no rales.  Abdominal: Soft. She exhibits no distension. There is no tenderness.  Genitourinary:  No CVA tenderness  Musculoskeletal: She exhibits no edema.  Neurological: She is alert. No cranial nerve deficit.  Skin: Skin is warm and dry.   ED Course   Procedures (including critical care time)  Labs Reviewed  COMPREHENSIVE METABOLIC PANEL - Abnormal; Notable for the following:    Glucose, Bld 121 (*)    BUN 36 (*)    Creatinine, Ser 1.87 (*)    GFR calc non Af Amer 23 (*)    GFR calc Af Amer 27 (*)    All other components within  normal limits  URINALYSIS, ROUTINE W REFLEX MICROSCOPIC - Abnormal; Notable for the following:    APPearance CLOUDY (*)    Bilirubin Urine SMALL (*)    Protein, ur >300 (*)    Leukocytes, UA LARGE (*)    All other components within normal limits  URINE MICROSCOPIC-ADD ON - Abnormal; Notable for the following:    Bacteria, UA MANY (*)    All other components within normal limits  URINE CULTURE  CBC   Dg Chest 2 View  02/28/2013   *RADIOLOGY REPORT*  Clinical Data: Hypertension, weakness  CHEST - 2 VIEW  Comparison: 07/22/2010  Findings: The cardiac shadow is stable.  A small hiatal hernia is noted.  The lungs are hyperinflated consistent with COPD.  No focal infiltrate is seen.  IMPRESSION: COPD without acute abnormality.   Original Report Authenticated By: Alcide Clever, M.D.   1. Hypertension   2. CKD (chronic kidney disease) stage 4, GFR 15-29 ml/min   3. UTI (lower urinary tract infection)   4. Acute pyelonephritis   5. HTN (hypertension)     MDM  79 year old female presents with elevated BP and failed outpatient treatment of UTI. - BP elevated at 189/92.  Will give PO hydralazine and monitor for improvement. - Urinalysis consistent with UTI.  UA, fevers, and nausea consistent with pyelonephritis.  Will give IV rocephin 1 g.  Urine sent for culture.  1630 - BP now markedly improved.  Patient however still not feeling well.   [facility] to admit for acute pyelonephritis (failed outpatient treatment.)  Tommie Sams, DO 02/28/13 1635  Tommie Sams, DO 02/28/13 1635

## 2013-02-28 NOTE — ED Notes (Signed)
Pt being transported to radiology

## 2013-02-28 NOTE — H&P (Addendum)
[facility]                                                                                    Patient Demographics  Meredith Bender, is a 79 y.o. female  CSN: [id number]  MRN: [id number]  DOB - November 15, 1925  Admit Date - 02/28/2013  Outpatient Primary MD for the patient is Alva Garnet., MD   With History of -  Past Medical History  Diagnosis Date  . Hypertension   . CVA (cerebral infarction)   . Dyslipidemia 02/28/2013      History reviewed. No pertinent past surgical history.  in for   Chief Complaint  Patient presents with  . Hypertension  . Urinary Tract Infection  . Weakness     HPI  Meredith Bender  is a 79 y.o. female,  Remote history of CVA, hypertension, was diagnosed with UTI one week ago by his PCP and placed on Cipro.Her patient continues to have dysuria and mild bilateral flank discomfort, she's also been having very high blood pressures and several times her medications have been adjusted by her PCP, she also complains of generalized headache mostly located in the frontal area but at times all over her head, denies any photophobia, no nausea vomiting, no focal deficits. She came to the ER as her blood pressure continues to stay high, she continues to have some dysuria and generalized headaches, in the ER she was diagnosed with UTI, blood pressure was treated with hydralazine with good effect and I was called for admission.     Review of Systems    In addition to the HPI above,   No Fever-chills, + generalized Headache, No changes with Vision or hearing, No problems swallowing food or Liquids, No Chest pain, Cough or Shortness of Breath, No Abdominal pain, No Nausea or Vommitting, Bowel movements are regular, No Blood in stool or Urine, + dysuria, No new skin rashes or bruises, No new joints pains-aches,  No new weakness, tingling, numbness in any extremity, No recent weight gain or loss, No polyuria, polydypsia or polyphagia, No significant  Mental Stressors.  A full 10 point Review of Systems was done, except as stated above, all other Review of Systems were negative.   Social History History  Substance Use Topics  . Smoking status: Never Smoker   . Smokeless tobacco: Not on file  . Alcohol Use: No      Family History No CAD   Prior to Admission medications   Medication Sig Start Date End Date Taking? Authorizing Provider  acetaminophen (TYLENOL) 325 MG tablet Take 325 mg by mouth every 6 (six) hours as needed for pain.   Yes Historical Provider, MD  atorvastatin (LIPITOR) 10 MG tablet Take 10 mg by mouth daily.     Yes Historical Provider, MD  Cholecalciferol (VITAMIN D PO) Take 1 tablet by mouth daily.   Yes Historical Provider, MD  hydrALAZINE (APRESOLINE) 25 MG tablet Take 25 mg by mouth 3 (three) times daily.   Yes Historical Provider, MD  loratadine (CLARITIN) 10 MG tablet Take 10 mg by mouth daily.     Yes Historical Provider, MD  omeprazole (PRILOSEC) 20 MG capsule Take  20 mg by mouth daily.     Yes Historical Provider, MD  verapamil (CALAN-SR) 180 MG CR tablet Take 180 mg by mouth at bedtime.    Yes Historical Provider, MD  vitamin E (VITAMIN E) 400 UNIT capsule Take 400 Units by mouth daily.     Yes Historical Provider, MD    Allergies  Allergen Reactions  . Codeine   . Penicillins     Physical Exam  Vitals  Blood pressure 155/76, pulse 83, temperature 98 F (36.7 C), temperature source Oral, resp. rate 27, SpO2 98.00%.   1. General elderly white female sitting in bed in NAD,     2. Normal affect and insight, Not Suicidal or Homicidal, Awake Alert, Oriented X 3.  3. No F.N deficits, ALL C.Nerves Intact, Strength 5/5 all 4 extremities, Sensation intact all 4 extremities, Plantars down going.  4. Ears and Eyes appear Normal, Conjunctivae clear, PERRLA. Moist Oral Mucosa.  5. Supple Neck, No JVD, No cervical lymphadenopathy appriciated, No Carotid Bruits.  6. Symmetrical Chest wall movement,  Good air movement bilaterally, CTAB.  7. RRR, No Gallops, Rubs or Murmurs, No Parasternal Heave.  8. Positive Bowel Sounds, Abdomen Soft, Non tender, No organomegaly appriciated,No rebound -guarding or rigidity.  9.  No Cyanosis, Normal Skin Turgor, No Skin Rash or Bruise.  10. Good muscle tone,  joints appear normal , no effusions, Normal ROM.  11. No Palpable Lymph Nodes in Neck or Axillae     Data Review  CBC  Recent Labs Lab 02/28/13 1243  WBC 8.9  HGB 13.8  HCT 41.1  PLT 301  MCV 84.2  MCH 28.3  MCHC 33.6  RDW 14.3   ------------------------------------------------------------------------------------------------------------------  Chemistries   Recent Labs Lab 02/28/13 1243  NA 138  K 4.4  CL 105  CO2 20  GLUCOSE 121*  BUN 36*  CREATININE 1.87*  CALCIUM 9.5  AST 20  ALT 12  ALKPHOS 101  BILITOT 0.4   ------------------------------------------------------------------------------------------------------------------ CrCl is unknown because there is no height on file for the current visit. ------------------------------------------------------------------------------------------------------------------ No results found for this basename: TSH, T4TOTAL, FREET3, T3FREE, THYROIDAB,  in the last 72 hours   Coagulation profile No results found for this basename: INR, PROTIME,  in the last 168 hours ------------------------------------------------------------------------------------------------------------------- No results found for this basename: DDIMER,  in the last 72 hours -------------------------------------------------------------------------------------------------------------------  Cardiac Enzymes No results found for this basename: CK, CKMB, TROPONINI, MYOGLOBIN,  in the last 168 hours ------------------------------------------------------------------------------------------------------------------ No components found with this basename: POCBNP,     ---------------------------------------------------------------------------------------------------------------  Urinalysis    Component Value Date/Time   COLORURINE YELLOW 02/28/2013 1349   APPEARANCEUR CLOUDY* 02/28/2013 1349   LABSPEC 1.024 02/28/2013 1349   PHURINE 5.0 02/28/2013 1349   GLUCOSEU NEGATIVE 02/28/2013 1349   HGBUR NEGATIVE 02/28/2013 1349   BILIRUBINUR SMALL* 02/28/2013 1349   KETONESUR NEGATIVE 02/28/2013 1349   PROTEINUR >300* 02/28/2013 1349   UROBILINOGEN 0.2 02/28/2013 1349   NITRITE NEGATIVE 02/28/2013 1349   LEUKOCYTESUR LARGE* 02/28/2013 1349    ----------------------------------------------------------------------------------------------------------------  Imaging results:   Dg Chest 2 View  02/28/2013   *RADIOLOGY REPORT*  Clinical Data: Hypertension, weakness  CHEST - 2 VIEW  Comparison: 07/22/2010  Findings: The cardiac shadow is stable.  A small hiatal hernia is noted.  The lungs are hyperinflated consistent with COPD.  No focal infiltrate is seen.  IMPRESSION: COPD without acute abnormality.   Original Report Authenticated By: Alcide Clever, M.D.    My personal review of EKG: Rhythm  NSR, Rate  86 /min,   no Acute ST changes    Assessment & Plan   1.Hypertensive urgency (Accelerated HTN) with headaches - she will be admitted to telemetry bed, blood pressure medications have been titrated, will Give Motrin as neededfor headaches for 3 doses, athough she has no focal deficits but her headaches have been going on for the last 1-2 weeks Will check MRI of the brain too.   2. UTI. Failed outpatient treatment with Cipro, obtain urine cultures place her on IV Rocephin for now. She does have Mild flank pain will check renal ultrasound to rule out pilar nephritis the    3.Chronic kidney disease stage IV baseline creatinine appears to be between 1.8 and 2, creatinine currently around baseline, we will monitor it as needed    4. History of dyslipidemia continue  home dose statin.    5.History of GERD continue PPI.    6. Remote history of CVA. No focal deficits, she is currently only on statin for secondary prevention. Will defer any antiplatelet medication to PCP or primary neurologist.    DVT Prophylaxis Heparin   AM Labs Ordered, also please review Full Orders  Family Communication: Admission, patients condition and plan of care including tests being ordered have been discussed with the patient and Son Daughter who indicate understanding and agree with the plan and Code Status.  Code Status Full  Likely DC to  Home  Time spent in minutes : 35  Condition Marinell Blight K M.D on 02/28/2013 at 5:00 PM  Between 7am to 7pm - Pager - [phone]  After 7pm go to [email or web address] - password [email or web address]  And look for the night coverage person covering me after hours  [facility] Office  [phone]

## 2013-03-01 ENCOUNTER — Inpatient Hospital Stay (HOSPITAL_COMMUNITY): Payer: Medicare Other

## 2013-03-01 DIAGNOSIS — E785 Hyperlipidemia, unspecified: Secondary | ICD-10-CM

## 2013-03-01 DIAGNOSIS — I059 Rheumatic mitral valve disease, unspecified: Secondary | ICD-10-CM

## 2013-03-01 LAB — BASIC METABOLIC PANEL
BUN: 39 mg/dL — ABNORMAL HIGH (ref 6–23)
CO2: 21 mEq/L (ref 19–32)
Calcium: 8.5 mg/dL (ref 8.4–10.5)
Chloride: 110 mEq/L (ref 96–112)
Creatinine, Ser: 2.02 mg/dL — ABNORMAL HIGH (ref 0.50–1.10)

## 2013-03-01 LAB — CBC
HCT: 34 % — ABNORMAL LOW (ref 36.0–46.0)
MCHC: 32.1 g/dL (ref 30.0–36.0)
MCV: 85.2 fL (ref 78.0–100.0)
Platelets: 223 10*3/uL (ref 150–400)
RDW: 14.5 % (ref 11.5–15.5)
WBC: 5.5 10*3/uL (ref 4.0–10.5)

## 2013-03-01 LAB — URINE CULTURE

## 2013-03-01 MED ORDER — SACCHAROMYCES BOULARDII 250 MG PO CAPS
250.0000 mg | ORAL_CAPSULE | Freq: Two times a day (BID) | ORAL | Status: DC
  Administered 2013-03-01 – 2013-03-02 (×3): 250 mg via ORAL
  Filled 2013-03-01 (×4): qty 1

## 2013-03-01 MED ORDER — TRAMADOL HCL 50 MG PO TABS
50.0000 mg | ORAL_TABLET | Freq: Four times a day (QID) | ORAL | Status: DC | PRN
  Administered 2013-03-01 – 2013-03-02 (×4): 50 mg via ORAL
  Filled 2013-03-01 (×4): qty 1

## 2013-03-01 MED ORDER — ACETAMINOPHEN 500 MG PO TABS
1000.0000 mg | ORAL_TABLET | Freq: Three times a day (TID) | ORAL | Status: DC
  Administered 2013-03-01 – 2013-03-02 (×4): 1000 mg via ORAL
  Filled 2013-03-01 (×6): qty 2

## 2013-03-01 NOTE — Progress Notes (Signed)
Echo Lab  2D Echocardiogram completed.  Ahliya Glatt L Aerith Canal, RDCS 03/01/2013 1:48 PM

## 2013-03-01 NOTE — Clinical Documentation Improvement (Signed)
THIS DOCUMENT IS NOT A PERMANENT PART OF THE MEDICAL RECORD  Please update your documentation with the medical record to reflect your response to this query. If you need help knowing how to do this please call [phone].  03/01/13  Dear Dr. Thedore Mins,   Per Note patient with history CKD stage IV and HTN admitted with "HTN Urgency". This term is recognized by CMS as simply "HTN". Please clarify this term in Notes and DC summary to better illustrate this patient's severity of illness and risk of mortality. Thank you.  Possible Clinical Conditions?  -  Hypertension -  Accelerated Hypertension -  Malignant Hypertension -  Or Other Condition ( please specify)   You may use possible, probable, or suspect with inpatient documentation. Possible, probable, suspected diagnoses MUST be documented at the time of discharge.  Reviewed: additional documentation in the medical record  Thank You,  Beverley Fiedler RN BSN Clinical Documentation Specialist: [phone] Health Information Management [place]

## 2013-03-01 NOTE — Progress Notes (Signed)
PATIENT DETAILS Name: Meredith Bender Age: 79 y.o. Sex: female Date of Birth: 1926/01/20 Admit Date: 02/28/2013 Admitting Physician Leroy Sea, MD WRU:[id number],JXBJYNWG R., MD  Subjective: Headache better. No fever overnight.  Assessment/Plan: Principal Problem:   UTI (lower urinary tract infection) - Recently completed a seven-day course of ciprofloxacin, unfortunately continued to feel feverish intermittently over the past one week or so. UA on admission positive for UTI, now on Rocephin. Renal ultrasound does not show any hydronephrosis. Await urine cultures. - Does have a history of C. difficile in the past, we need to minimize antibiotics as well, add florastor  Active Problems: Hypertensive urgency - BP currently much better, continue clonidine, hydralazine and verapamil. Will likely need addition of diuretic therapy at some point.  Headaches - Suspect secondary to above, could have been rebound headaches. - MRI brain negative for acute findings.  Chronic kidney disease stage IV - Creatinine at baseline, monitor electrolytes periodically  Dyslipidemia - Continue with statins  Chronic arthritis - Suspect osteoarthritis - Uses as needed tramadol at home  Disposition: Remain inpatient- home in a.m.  DVT Prophylaxis: Prophylactic Heparin   Code Status: Full code   Family Communication Daughter at bedside  Procedures:  None  CONSULTS:  None   MEDICATIONS: Scheduled Meds: . atorvastatin  10 mg Oral Daily  . cefTRIAXone (ROCEPHIN)  IV  1 g Intravenous Q24H  . cloNIDine  0.1 mg Oral BID  . heparin  5,000 Units Subcutaneous Q8H  . hydrALAZINE  25 mg Oral TID  . loratadine  10 mg Oral Daily  . pantoprazole  40 mg Oral Daily  . sodium chloride  3 mL Intravenous Q12H  . verapamil  180 mg Oral QHS   Continuous Infusions: . sodium chloride 100 mL/hr at 02/28/13 2150   PRN Meds:.albuterol, alum & mag hydroxide-simeth, guaiFENesin-dextromethorphan,  HYDROcodone-acetaminophen, ibuprofen, ondansetron (ZOFRAN) IV, ondansetron, polyethylene glycol  Antibiotics: Anti-infectives   Start     Dose/Rate Route Frequency Ordered Stop   03/01/13 1400  cefTRIAXone (ROCEPHIN) 1 g in dextrose 5 % 50 mL IVPB     1 g 100 mL/hr over 30 Minutes Intravenous Every 24 hours 02/28/13 1922     02/28/13 1430  cefTRIAXone (ROCEPHIN) 1 g in dextrose 5 % 50 mL IVPB     1 g 100 mL/hr over 30 Minutes Intravenous  Once 02/28/13 1429 02/28/13 1530       PHYSICAL EXAM: Vital signs in last 24 hours: Filed Vitals:   02/28/13 1843 02/28/13 2100 03/01/13 0504 03/01/13 1104  BP: 163/67 147/67 132/70 193/92  Pulse: 68 76 70   Temp: 98 F (36.7 C) 98.1 F (36.7 C) 98.2 F (36.8 C)   TempSrc: Oral Oral Oral   Resp: 20 18 18    Height: 5\' 4"  (1.626 m)     Weight: 51.256 kg (113 lb)  51.159 kg (112 lb 12.6 oz)   SpO2: 99% 97% 94%     Weight change:  Filed Weights   02/28/13 1843 03/01/13 0504  Weight: 51.256 kg (113 lb) 51.159 kg (112 lb 12.6 oz)   Body mass index is 19.35 kg/(m^2).   Gen Exam: Awake and alert with clear speech.   Neck: Supple, No JVD.   Chest: B/L Clear.   CVS: S1 S2 Regular, no murmurs.  Abdomen: soft, BS +, non tender, non distended. No CVA tenderness. Extremities: no edema, lower extremities warm to touch. Neurologic: Non Focal.   Skin: No Rash.   Wounds: N/A.  Intake/Output from previous day:  Intake/Output Summary (Last 24 hours) at 03/01/13 1108 Last data filed at 03/01/13 1103  Gross per 24 hour  Intake      3 ml  Output      0 ml  Net      3 ml     LAB RESULTS: CBC  Recent Labs Lab 02/28/13 1243 02/28/13 1940 03/01/13 0550  WBC 8.9 7.5 5.5  HGB 13.8 12.8 10.9*  HCT 41.1 39.6 34.0*  PLT 301 251 223  MCV 84.2 84.8 85.2  MCH 28.3 27.4 27.3  MCHC 33.6 32.3 32.1  RDW 14.3 14.2 14.5    Chemistries   Recent Labs Lab 02/28/13 1243 02/28/13 1940 03/01/13 0550  NA 138  --  141  K 4.4  --  4.9  CL 105   --  110  CO2 20  --  21  GLUCOSE 121*  --  87  BUN 36*  --  39*  CREATININE 1.87* 1.83* 2.02*  CALCIUM 9.5  --  8.5    CBG: No results found for this basename: GLUCAP,  in the last 168 hours  GFR Estimated Creatinine Clearance: 16.5 ml/min (by C-G formula based on Cr of 2.02).  Coagulation profile No results found for this basename: INR, PROTIME,  in the last 168 hours  Cardiac Enzymes No results found for this basename: CK, CKMB, TROPONINI, MYOGLOBIN,  in the last 168 hours  No components found with this basename: POCBNP,  No results found for this basename: DDIMER,  in the last 72 hours No results found for this basename: HGBA1C,  in the last 72 hours No results found for this basename: CHOL, HDL, LDLCALC, TRIG, CHOLHDL, LDLDIRECT,  in the last 72 hours No results found for this basename: TSH, T4TOTAL, FREET3, T3FREE, THYROIDAB,  in the last 72 hours No results found for this basename: VITAMINB12, FOLATE, FERRITIN, TIBC, IRON, RETICCTPCT,  in the last 72 hours No results found for this basename: LIPASE, AMYLASE,  in the last 72 hours  Urine Studies No results found for this basename: UACOL, UAPR, USPG, UPH, UTP, UGL, UKET, UBIL, UHGB, UNIT, UROB, ULEU, UEPI, UWBC, URBC, UBAC, CAST, CRYS, UCOM, BILUA,  in the last 72 hours  MICROBIOLOGY: No results found for this or any previous visit (from the past 240 hour(s)).  RADIOLOGY STUDIES/RESULTS: Dg Chest 2 View  02/28/2013   *RADIOLOGY REPORT*  Clinical Data: Hypertension, weakness  CHEST - 2 VIEW  Comparison: 07/22/2010  Findings: The cardiac shadow is stable.  A small hiatal hernia is noted.  The lungs are hyperinflated consistent with COPD.  No focal infiltrate is seen.  IMPRESSION: COPD without acute abnormality.   Original Report Authenticated By: Alcide Clever, M.D.   Mr Brain Wo Contrast  03/01/2013   *RADIOLOGY REPORT*  Clinical Data: Hypertensive urgency with headaches.  No focal neurologic deficit.  MRI HEAD WITHOUT  CONTRAST  Technique:  Multiplanar, multiecho pulse sequences of the brain and surrounding structures were obtained according to standard protocol without intravenous contrast.  Comparison: No prior CT or MRI.  Findings: There is no evidence for acute infarction, intracranial hemorrhage, mass lesion, hydrocephalus, or extra-axial fluid. Moderate age related atrophy.  Extensive subcortical and periventricular white matter signal abnormality in association with multiple foci of chronic microbleeds and prominent perivascular spaces consistent with advanced hypertensive cerebrovascular disease.  No evidence for malignant hypertension/P R E S.  Remote bilateral cerebellar infarcts.  Remote bilateral thalamic and left basal ganglia lacunar infarcts.  Remote left greater than right subcortical lacunar infarcts.  Flow voids are maintained in the major intracranial vasculature although the vessels are dolichoectatic consistent with longstanding hypertension.  The calvarium is grossly intact.  There is no midline abnormality.  Bilateral cataract extraction.  No acute sinus or mastoid disease.  IMPRESSION: Advanced hypertensive cerebrovascular disease as described.  No acute stroke.  No  MRI findings to suggest malignant hypertension/P R E S.   Original Report Authenticated By: Davonna Belling, M.D.   US Renal  02/28/2013   *RADIOLOGY REPORT*  Clinical Data: Chronic kidney disease.  Elevated creatinine.  RENAL/URINARY TRACT ULTRASOUND COMPLETE  Comparison:  01/06/2012CT.  Findings:  Right Kidney:  8.4 cm. No hydronephrosis.  Increased echogenicity and decreased cortical thickness.  Left Kidney:  7.5 cm. No hydronephrosis.  Increased renal echogenicity and decreased cortical thickness.  8 mm upper pole left renal calculus.  Bladder:  Within normal limits.  IMPRESSION: Findings of medical renal disease, without hydronephrosis.  Left nephrolithiasis.   Original Report Authenticated By: Jeronimo Greaves, M.D.    Jeoffrey Massed,  MD  [facility] Pager:[phone]  If 7PM-7AM, please contact night-coverage [email or web address] Password [email or web address] 03/01/2013, 11:08 AM   LOS: 1 day

## 2013-03-01 NOTE — Progress Notes (Signed)
UR Completed Karnisha Lefebre Graves-Bigelow, RN,BSN [phone]  

## 2013-03-01 NOTE — Care Management Note (Addendum)
  Page 2 of 2   03/01/2013     10:44:13 AM   CARE MANAGEMENT NOTE 03/01/2013  Patient:  Meredith Bender, Meredith Bender   Account Number:  [id number]  Date Initiated:  03/01/2013  Documentation initiated by:  GRAVES-BIGELOW,Aristotelis Vilardi  Subjective/Objective Assessment:   Pt admitted for hypertensive urgency. Pt is from home alone and will go to daughters home in [place] once medically stable for d/c. CM provide pt with private duty agency list also.      Action/Plan:   CM offered choice for [facility] services and family chose [place] for [facility] services. Referral was made to [place]. SOC to begin within 24-48 hours post d/c.   Anticipated DC Date:  03/02/2013   Anticipated DC Plan:  HOME W HOME HEALTH SERVICES      DC Planning Services  CM consult      [facility] Choice  HOME HEALTH   Choice offered to / List presented to:  C-1 Patient        HH arranged  HH-1 RN  HH-10 DISEASE MANAGEMENT  HH-2 PT  HH-4 NURSE'S AIDE      HH agency  [facility]   Status of service:  Completed, signed off Medicare Important Message given?   (If response is "NO", the following Medicare IM given date fields will be blank) Date Medicare IM given:   Date Additional Medicare IM given:    Discharge Disposition:  HOME W HOME HEALTH SERVICES  Per UR Regulation:  Reviewed for med. necessity/level of care/duration of stay  If discussed at Long Length of Stay Meetings, dates discussed:    Comments:

## 2013-03-02 MED ORDER — CLONIDINE HCL 0.1 MG PO TABS: 0.1000 mg | ORAL_TABLET | Freq: Two times a day (BID) | ORAL | Status: DC

## 2013-03-02 NOTE — Discharge Summary (Signed)
PATIENT DETAILS Name: Meredith Bender Age: 79 y.o. Sex: female Date of Birth: [birth year] MRN: [id number]. Admit Date: 02/28/2013 Admitting Physician: Leroy Sea, MD WUJ:WJXBJYN,WGNFAOZH R., MD  Recommendations for Outpatient Follow-up:  1. Optimize BP control 2. Monitor Renal Function periodically 3. Avoid unnecessary antibiotics-hx of C Diff  PRIMARY DISCHARGE DIAGNOSIS:  Principal Problem:   UTI (lower urinary tract infection) Active Problems:   Accelerated Hypertension   CKD (chronic kidney disease) stage 4, GFR 15-29 ml/min   CVA (cerebral infarction)   Dyslipidemia     PAST MEDICAL HISTORY: Past Medical History  Diagnosis Date  . Hypertension   . CVA (cerebral infarction)   . Dyslipidemia 02/28/2013  . Anginal pain   . Stroke   . Chronic kidney disease   . Arthritis   . COPD (chronic obstructive pulmonary disease)     DISCHARGE MEDICATIONS:   Medication List         acetaminophen 325 MG tablet  Commonly known as:  TYLENOL  Take 325 mg by mouth every 6 (six) hours as needed for pain.     atorvastatin 10 MG tablet  Commonly known as:  LIPITOR  Take 10 mg by mouth daily.     cloNIDine 0.1 MG tablet  Commonly known as:  CATAPRES  Take 1 tablet (0.1 mg total) by mouth 2 (two) times daily.     hydrALAZINE 25 MG tablet  Commonly known as:  APRESOLINE  Take 25 mg by mouth 3 (three) times daily.     loratadine 10 MG tablet  Commonly known as:  CLARITIN  Take 10 mg by mouth daily.     omeprazole 20 MG capsule  Commonly known as:  PRILOSEC  Take 20 mg by mouth daily.     verapamil 180 MG CR tablet  Commonly known as:  CALAN-SR  Take 180 mg by mouth at bedtime.     VITAMIN D PO  Take 1 tablet by mouth daily.     vitamin E 400 UNIT capsule  Generic drug:  vitamin E  Take 400 Units by mouth daily.        ALLERGIES:   Allergies  Allergen Reactions  . Codeine Nausea Only  . Penicillins Rash    Pt does not remember how long ago or severity of  reaction.     BRIEF HPI:  See H&P, Labs, Consult and Test reports for all details in brief, patient was admitted for generalized Headaches, uncontrolled BP. Recently was treated with Cipro for a UTI.  CONSULTATIONS:   None  PERTINENT RADIOLOGIC STUDIES: Dg Chest 2 View  02/28/2013   *RADIOLOGY REPORT*  Clinical Data: Hypertension, weakness  CHEST - 2 VIEW  Comparison: 07/22/2010  Findings: The cardiac shadow is stable.  A small hiatal hernia is noted.  The lungs are hyperinflated consistent with COPD.  No focal infiltrate is seen.  IMPRESSION: COPD without acute abnormality.   Original Report Authenticated By: Alcide Clever, M.D.   Mr Brain Wo Contrast  03/01/2013   *RADIOLOGY REPORT*  Clinical Data: Hypertensive urgency with headaches.  No focal neurologic deficit.  MRI HEAD WITHOUT CONTRAST  Technique:  Multiplanar, multiecho pulse sequences of the brain and surrounding structures were obtained according to standard protocol without intravenous contrast.  Comparison: No prior CT or MRI.  Findings: There is no evidence for acute infarction, intracranial hemorrhage, mass lesion, hydrocephalus, or extra-axial fluid. Moderate age related atrophy.  Extensive subcortical and periventricular white matter signal abnormality in association with multiple foci of  chronic microbleeds and prominent perivascular spaces consistent with advanced hypertensive cerebrovascular disease.  No evidence for malignant hypertension/P R E S.  Remote bilateral cerebellar infarcts.  Remote bilateral thalamic and left basal ganglia lacunar infarcts.  Remote left greater than right subcortical lacunar infarcts.  Flow voids are maintained in the major intracranial vasculature although the vessels are dolichoectatic consistent with longstanding hypertension.  The calvarium is grossly intact.  There is no midline abnormality.  Bilateral cataract extraction.  No acute sinus or mastoid disease.  IMPRESSION: Advanced hypertensive  cerebrovascular disease as described.  No acute stroke.  No  MRI findings to suggest malignant hypertension/P R E S.   Original Report Authenticated By: Davonna Belling, M.D.   US Renal  02/28/2013   *RADIOLOGY REPORT*  Clinical Data: Chronic kidney disease.  Elevated creatinine.  RENAL/URINARY TRACT ULTRASOUND COMPLETE  Comparison:  01/06/2012CT.  Findings:  Right Kidney:  8.4 cm. No hydronephrosis.  Increased echogenicity and decreased cortical thickness.  Left Kidney:  7.5 cm. No hydronephrosis.  Increased renal echogenicity and decreased cortical thickness.  8 mm upper pole left renal calculus.  Bladder:  Within normal limits.  IMPRESSION: Findings of medical renal disease, without hydronephrosis.  Left nephrolithiasis.   Original Report Authenticated By: Jeronimo Greaves, M.D.     PERTINENT LAB RESULTS: CBC:  Recent Labs  02/28/13 1940 03/01/13 0550  WBC 7.5 5.5  HGB 12.8 10.9*  HCT 39.6 34.0*  PLT 251 223   CMET CMP     Component Value Date/Time   NA 141 03/01/2013 0550   K 4.9 03/01/2013 0550   CL 110 03/01/2013 0550   CO2 21 03/01/2013 0550   GLUCOSE 87 03/01/2013 0550   BUN 39* 03/01/2013 0550   CREATININE 2.02* 03/01/2013 0550   CALCIUM 8.5 03/01/2013 0550   PROT 6.8 02/28/2013 1243   ALBUMIN 3.6 02/28/2013 1243   AST 20 02/28/2013 1243   ALT 12 02/28/2013 1243   ALKPHOS 101 02/28/2013 1243   BILITOT 0.4 02/28/2013 1243   GFRNONAA 21* 03/01/2013 0550   GFRAA 25* 03/01/2013 0550    GFR Estimated Creatinine Clearance: 16.7 ml/min (by C-G formula based on Cr of 2.02). No results found for this basename: LIPASE, AMYLASE,  in the last 72 hours No results found for this basename: CKTOTAL, CKMB, CKMBINDEX, TROPONINI,  in the last 72 hours No components found with this basename: POCBNP,  No results found for this basename: DDIMER,  in the last 72 hours No results found for this basename: HGBA1C,  in the last 72 hours No results found for this basename: CHOL, HDL, LDLCALC, TRIG, CHOLHDL,  LDLDIRECT,  in the last 72 hours No results found for this basename: TSH, T4TOTAL, FREET3, T3FREE, THYROIDAB,  in the last 72 hours No results found for this basename: VITAMINB12, FOLATE, FERRITIN, TIBC, IRON, RETICCTPCT,  in the last 72 hours Coags: No results found for this basename: PT, INR,  in the last 72 hours Microbiology: Recent Results (from the past 240 hour(s))  URINE CULTURE     Status: None   Collection Time    02/28/13  1:49 PM      Result Value Range Status   Specimen Description URINE, CLEAN CATCH   Final   Special Requests NONE   Final   Culture  Setup Time     Final   Value: 02/28/2013 21:57     Performed at [facility] Count     Final   Value: 45,000 COLONIES/ML  Performed at [facility]     Final   Value: Multiple bacterial morphotypes present, none predominant. Suggest appropriate recollection if clinically indicated.     Performed at [facility]   Report Status 03/01/2013 FINAL   Final     BRIEF HOSPITAL COURSE:   Principal Problem:   UTI (lower urinary tract infection) -Recently completed a seven-day course of ciprofloxacin, unfortunately continued to feel feverish intermittently over the past one week or so. UA on admission positive for UTI, started on Rocephin. Renal ultrasound does not show any hydronephrosis. Urine cultures-showing multiple morphotype's. Remained Afebrile during her stay here. Since asymptomatic currently-not febrile and with prior hx of C Diff-will not discharge on antibiotics. Monitor off antibioitcs  Active Problems: Accelerated HTN - BP currently much better, continue clonidine, hydralazine and verapamil. Will likely need addition of diuretic therapy at some point.  Headaches  - Suspect secondary to above, could have been rebound headaches. This has completely resolved with good BP control. - MRI brain negative for acute findings.  Chronic kidney disease stage IV  - Creatinine at  baseline, monitor electrolytes periodically   Dyslipidemia  - Continue with statins   Chronic arthritis  - Suspect osteoarthritis  - Uses as needed tramadol at home   TODAY-DAY OF DISCHARGE:  Subjective:   Seryna Rebman today has no headache,no chest abdominal pain,no new weakness tingling or numbness, feels much better wants to go home today.   Objective:   Blood pressure 159/66, pulse 63, temperature 98.3 F (36.8 C), temperature source Oral, resp. rate 18, height 5\' 4"  (1.626 m), weight 52.118 kg (114 lb 14.4 oz), SpO2 95.00%.  Intake/Output Summary (Last 24 hours) at 03/02/13 1132 Last data filed at 03/02/13 0900  Gross per 24 hour  Intake    720 ml  Output      0 ml  Net    720 ml   Filed Weights   02/28/13 1843 03/01/13 0504 03/02/13 0545  Weight: 51.256 kg (113 lb) 51.159 kg (112 lb 12.6 oz) 52.118 kg (114 lb 14.4 oz)    Exam Awake Alert, Oriented *3, No new F.N deficits, Normal affect [place].AT,PERRAL Supple Neck,No JVD, No cervical lymphadenopathy appriciated.  Symmetrical Chest wall movement, Good air movement bilaterally, CTAB RRR,No Gallops,Rubs or new Murmurs, No Parasternal Heave +ve B.Sounds, Abd Soft, Non tender, No organomegaly appriciated, No rebound -guarding or rigidity. No Cyanosis, Clubbing or edema, No new Rash or bruise  DISCHARGE CONDITION: Stable  DISPOSITION: Home with home health services  DISCHARGE INSTRUCTIONS:    Activity:  As tolerated   Diet recommendation: Heart Healthy diet       Discharge Orders   Future Orders Complete By Expires   Call MD for:  persistant dizziness or light-headedness  As directed    Call MD for:  severe uncontrolled pain  As directed    Diet - low sodium heart healthy  As directed    Increase activity slowly  As directed       Follow-up Information   Follow up with [facility]. [facility] RN, Physical Therapy and Occupational Therapy, aide)    Contact information:   [phone]       Follow up with Alva Garnet., MD. Schedule an appointment as soon as possible for a visit in 1 week.   Specialty:  Internal Medicine   Contact information:   [address] [place] [address] [phone]       Follow up  with Dagoberto Ligas., MD. (keep next appt)    Specialty:  Nephrology   Contact information:   [address] [facility] [place] [address] [phone]      Total Time spent on discharge equals 45 minutes.  SignedJeoffrey Massed 03/02/2013 11:32 AM

## 2013-03-04 DIAGNOSIS — N184 Chronic kidney disease, stage 4 (severe): Secondary | ICD-10-CM | POA: Diagnosis not present

## 2013-03-04 DIAGNOSIS — N390 Urinary tract infection, site not specified: Secondary | ICD-10-CM | POA: Diagnosis not present

## 2013-03-04 DIAGNOSIS — I129 Hypertensive chronic kidney disease with stage 1 through stage 4 chronic kidney disease, or unspecified chronic kidney disease: Secondary | ICD-10-CM | POA: Diagnosis not present

## 2013-03-05 DIAGNOSIS — N184 Chronic kidney disease, stage 4 (severe): Secondary | ICD-10-CM | POA: Diagnosis not present

## 2013-03-05 DIAGNOSIS — I129 Hypertensive chronic kidney disease with stage 1 through stage 4 chronic kidney disease, or unspecified chronic kidney disease: Secondary | ICD-10-CM | POA: Diagnosis not present

## 2013-03-05 DIAGNOSIS — N390 Urinary tract infection, site not specified: Secondary | ICD-10-CM | POA: Diagnosis not present

## 2013-03-07 DIAGNOSIS — N390 Urinary tract infection, site not specified: Secondary | ICD-10-CM | POA: Diagnosis not present

## 2013-03-07 DIAGNOSIS — I129 Hypertensive chronic kidney disease with stage 1 through stage 4 chronic kidney disease, or unspecified chronic kidney disease: Secondary | ICD-10-CM | POA: Diagnosis not present

## 2013-03-07 DIAGNOSIS — N184 Chronic kidney disease, stage 4 (severe): Secondary | ICD-10-CM | POA: Diagnosis not present

## 2013-03-08 NOTE — ED Provider Notes (Signed)
I saw and evaluated the patient, reviewed the resident's note and I agree with the findings and plan.  Toy Baker, MD 03/08/13 [id number]

## 2013-03-11 DIAGNOSIS — I129 Hypertensive chronic kidney disease with stage 1 through stage 4 chronic kidney disease, or unspecified chronic kidney disease: Secondary | ICD-10-CM | POA: Diagnosis not present

## 2013-03-11 DIAGNOSIS — N184 Chronic kidney disease, stage 4 (severe): Secondary | ICD-10-CM | POA: Diagnosis not present

## 2013-03-11 DIAGNOSIS — N390 Urinary tract infection, site not specified: Secondary | ICD-10-CM | POA: Diagnosis not present

## 2013-03-12 DIAGNOSIS — N390 Urinary tract infection, site not specified: Secondary | ICD-10-CM | POA: Diagnosis not present

## 2013-03-12 DIAGNOSIS — N189 Chronic kidney disease, unspecified: Secondary | ICD-10-CM | POA: Diagnosis not present

## 2013-03-12 DIAGNOSIS — I10 Essential (primary) hypertension: Secondary | ICD-10-CM | POA: Diagnosis not present

## 2013-03-14 DIAGNOSIS — I129 Hypertensive chronic kidney disease with stage 1 through stage 4 chronic kidney disease, or unspecified chronic kidney disease: Secondary | ICD-10-CM | POA: Diagnosis not present

## 2013-03-14 DIAGNOSIS — N390 Urinary tract infection, site not specified: Secondary | ICD-10-CM | POA: Diagnosis not present

## 2013-03-14 DIAGNOSIS — N184 Chronic kidney disease, stage 4 (severe): Secondary | ICD-10-CM | POA: Diagnosis not present

## 2013-03-20 DIAGNOSIS — N390 Urinary tract infection, site not specified: Secondary | ICD-10-CM | POA: Diagnosis not present

## 2013-03-20 DIAGNOSIS — N184 Chronic kidney disease, stage 4 (severe): Secondary | ICD-10-CM | POA: Diagnosis not present

## 2013-03-20 DIAGNOSIS — I129 Hypertensive chronic kidney disease with stage 1 through stage 4 chronic kidney disease, or unspecified chronic kidney disease: Secondary | ICD-10-CM | POA: Diagnosis not present

## 2013-03-22 DIAGNOSIS — N390 Urinary tract infection, site not specified: Secondary | ICD-10-CM | POA: Diagnosis not present

## 2013-03-22 DIAGNOSIS — I129 Hypertensive chronic kidney disease with stage 1 through stage 4 chronic kidney disease, or unspecified chronic kidney disease: Secondary | ICD-10-CM | POA: Diagnosis not present

## 2013-03-22 DIAGNOSIS — N184 Chronic kidney disease, stage 4 (severe): Secondary | ICD-10-CM | POA: Diagnosis not present

## 2013-03-25 DIAGNOSIS — N390 Urinary tract infection, site not specified: Secondary | ICD-10-CM | POA: Diagnosis not present

## 2013-03-25 DIAGNOSIS — I129 Hypertensive chronic kidney disease with stage 1 through stage 4 chronic kidney disease, or unspecified chronic kidney disease: Secondary | ICD-10-CM | POA: Diagnosis not present

## 2013-03-25 DIAGNOSIS — N184 Chronic kidney disease, stage 4 (severe): Secondary | ICD-10-CM | POA: Diagnosis not present

## 2013-03-28 DIAGNOSIS — N184 Chronic kidney disease, stage 4 (severe): Secondary | ICD-10-CM | POA: Diagnosis not present

## 2013-03-28 DIAGNOSIS — I129 Hypertensive chronic kidney disease with stage 1 through stage 4 chronic kidney disease, or unspecified chronic kidney disease: Secondary | ICD-10-CM | POA: Diagnosis not present

## 2013-03-28 DIAGNOSIS — N390 Urinary tract infection, site not specified: Secondary | ICD-10-CM | POA: Diagnosis not present

## 2013-04-01 DIAGNOSIS — N184 Chronic kidney disease, stage 4 (severe): Secondary | ICD-10-CM | POA: Diagnosis not present

## 2013-04-01 DIAGNOSIS — I129 Hypertensive chronic kidney disease with stage 1 through stage 4 chronic kidney disease, or unspecified chronic kidney disease: Secondary | ICD-10-CM | POA: Diagnosis not present

## 2013-04-01 DIAGNOSIS — N390 Urinary tract infection, site not specified: Secondary | ICD-10-CM | POA: Diagnosis not present

## 2013-04-04 DIAGNOSIS — Z23 Encounter for immunization: Secondary | ICD-10-CM | POA: Diagnosis not present

## 2013-04-04 DIAGNOSIS — I10 Essential (primary) hypertension: Secondary | ICD-10-CM | POA: Diagnosis not present

## 2013-04-08 DIAGNOSIS — I129 Hypertensive chronic kidney disease with stage 1 through stage 4 chronic kidney disease, or unspecified chronic kidney disease: Secondary | ICD-10-CM | POA: Diagnosis not present

## 2013-04-08 DIAGNOSIS — N390 Urinary tract infection, site not specified: Secondary | ICD-10-CM | POA: Diagnosis not present

## 2013-04-08 DIAGNOSIS — N184 Chronic kidney disease, stage 4 (severe): Secondary | ICD-10-CM | POA: Diagnosis not present

## 2013-04-15 DIAGNOSIS — I129 Hypertensive chronic kidney disease with stage 1 through stage 4 chronic kidney disease, or unspecified chronic kidney disease: Secondary | ICD-10-CM | POA: Diagnosis not present

## 2013-04-15 DIAGNOSIS — N390 Urinary tract infection, site not specified: Secondary | ICD-10-CM | POA: Diagnosis not present

## 2013-04-15 DIAGNOSIS — N184 Chronic kidney disease, stage 4 (severe): Secondary | ICD-10-CM | POA: Diagnosis not present

## 2013-04-22 DIAGNOSIS — I129 Hypertensive chronic kidney disease with stage 1 through stage 4 chronic kidney disease, or unspecified chronic kidney disease: Secondary | ICD-10-CM | POA: Diagnosis not present

## 2013-04-22 DIAGNOSIS — N390 Urinary tract infection, site not specified: Secondary | ICD-10-CM | POA: Diagnosis not present

## 2013-04-22 DIAGNOSIS — N184 Chronic kidney disease, stage 4 (severe): Secondary | ICD-10-CM | POA: Diagnosis not present

## 2013-05-08 DIAGNOSIS — N189 Chronic kidney disease, unspecified: Secondary | ICD-10-CM | POA: Diagnosis not present

## 2013-05-08 DIAGNOSIS — I10 Essential (primary) hypertension: Secondary | ICD-10-CM | POA: Diagnosis not present

## 2013-06-12 ENCOUNTER — Encounter: Payer: Self-pay | Admitting: Family Medicine

## 2013-06-12 ENCOUNTER — Ambulatory Visit (INDEPENDENT_AMBULATORY_CARE_PROVIDER_SITE_OTHER): Payer: Medicare Other | Admitting: Family Medicine

## 2013-06-12 VITALS — BP 179/53 | HR 67 | Temp 97.8°F | Ht 64.0 in | Wt 110.5 lb

## 2013-06-12 DIAGNOSIS — E785 Hyperlipidemia, unspecified: Secondary | ICD-10-CM | POA: Diagnosis not present

## 2013-06-12 DIAGNOSIS — Z87440 Personal history of urinary (tract) infections: Secondary | ICD-10-CM | POA: Diagnosis not present

## 2013-06-12 DIAGNOSIS — N184 Chronic kidney disease, stage 4 (severe): Secondary | ICD-10-CM | POA: Diagnosis not present

## 2013-06-12 DIAGNOSIS — E538 Deficiency of other specified B group vitamins: Secondary | ICD-10-CM | POA: Diagnosis not present

## 2013-06-12 DIAGNOSIS — I10 Essential (primary) hypertension: Secondary | ICD-10-CM

## 2013-06-12 DIAGNOSIS — Z0000 Encounter for general adult medical examination without abnormal findings: Secondary | ICD-10-CM | POA: Diagnosis not present

## 2013-06-12 MED ORDER — CYANOCOBALAMIN 1000 MCG/ML IJ SOLN
1000.0000 ug | Freq: Once | INTRAMUSCULAR | Status: AC
  Administered 2013-06-12: 1000 ug via INTRAMUSCULAR

## 2013-06-12 MED ORDER — TRAMADOL HCL 50 MG PO TABS: ORAL_TABLET | ORAL | Status: DC

## 2013-06-12 NOTE — Progress Notes (Signed)
   Subjective:    Patient ID: Meredith Bender, female    DOB: 1926/02/06, 79 y.o.   MRN: [id number]  HPI  New patient here to establish care. She is here with her daughter. Daughter [facility] Benay Pike) is health care power of attorney. No current issues other than needing a refill on her tramadol. She takes one pill 3 or 4 times a day for her multiple areas of arthritis. She has had no problems with this and has been on many years. #2. Was hospitalized in August for what sounds like urinary tract infection this may be early sepsis. Has had no complications from that. At the time she also had a lot of diarrhea, unclear she had Clostridium difficile but her bowel movements are back to normal #3. Appetite over the last year or so has been less in particular less since the hospitalization. No nausea or early satiety,  It is just that nothing sounds good and she has no desire for food. Did have some good turnips the other day, but o/w not that interested in food. Lifelong she has been in 115-118 range weight wise. Her energy level is good ear he she sleeps well. She has a lot of arthralgias but this is basically unchanged over the last 2 years. She's interested in life and has no depressive symptoms. She lives alone. Her daughter lives in [place] her son lives in [place]. She seems content with this particular arrangement at this time. Her daughter is concerned that at some point she may need to move in with her because she's not sure how long her mom can maintain independent lifestyle.  PERTINENT  PMH / PSH: #1. Hypertension. She was in the hospital for her UTI evidently she had some blood pressures in the 180-190 range. She has no symptoms from this. Daughter recalls that when she was on clonidine her blood pressures were much better controlled in the 1:30 to 140 range. Daughter reports they stop the clonidine because they're worried about rebound hypertension. They also increased her verapamil at that  time. #2. One or 2 episodes of significant UTI/early sepsis most recent was in August of this year which required hospitalization. 3. On her problem list there was CVA but I think this was accidentally entered. I looked back in her imaging studies and she has no evidence of prior CVA and she has no focal deficits. 4. stage IV CKD. She has an appointment to see Dr. Allena Katz in December of this year. She's having no lower extremity edema, she still making normal amount of urine. I think her elevated creatinine was found incidentally when she was hospitalized. Most recent baseline according to her daughter is 2.02.  Review of Systems  Constitutional: Negative for fever and activity change.  Respiratory: Negative for chest tightness and shortness of breath.   Cardiovascular: Negative for chest pain, palpitations and leg swelling.  Psychiatric/Behavioral: Negative for sleep disturbance and dysphoric mood. The patient is not nervous/anxious.        Objective:   Physical Exam  Vital signs reviewed. GENERAL: Well-developed, well-nourished, no acute distress. CARDIOVASCULAR: Regular rate and rhythm no murmur gallop or rub LUNGS: Clear to auscultation bilaterally, no rales or wheeze. ABDOMEN: Soft positive bowel sounds NEURO: No gross focal neurological deficits. MSK: Movement of extremity x 4. PSYCH: AxOx4. normal thought process. Interactive affect.  EXT: no edema.  VASC DP pulses and radial pulses 2+ B=.        Assessment & Plan:

## 2013-06-12 NOTE — Assessment & Plan Note (Signed)
I don't have any of her previous lipid values. She's having no problems with the current regimen. At this time we'll leave it alone and continue on atorvastatin 10. Briefly discussed whether or not this is needed. Certainly she has enough risk factors it may be offering some stroke risk reduction. She's having no problems with that, we'll leave that alone as well for now.

## 2013-06-12 NOTE — Assessment & Plan Note (Signed)
Will be following up with Dr. Allena Katz in nephrology clinic. It seems her elevated creatinine may have been an incidental finding.

## 2013-06-12 NOTE — Assessment & Plan Note (Signed)
Hypertension significant elevated systolic blood pressure. Abdominal what her baseline to truly been. We had her sign a release of records today to see if we can get her last outpatient readings. I am hesitant to change her blood pressure regimen much because she seems to be doing extremely well. She is totally asymptomatic. Long-term, her stroke risk would be improved she had some improvement in her systolic. I don't how much of this is related to her kidney issues so after discussion with her and her daughter we decided to do nothing but continue the current regimen until after she sees Dr. Allena Katz in nephrology and I get her records. Should she develop symptoms in the meantime her daughter will call.

## 2013-06-12 NOTE — Patient Instructions (Signed)
Remember to call me about a week for your tramadol prescription runs out and I will send you one in the mail.  I would like to see you back sometime in January. It is great to have you in my medical practice, great to meet you and your daughter! Had a happy holiday!

## 2013-07-03 ENCOUNTER — Telehealth: Payer: Self-pay | Admitting: Family Medicine

## 2013-07-03 NOTE — Telephone Encounter (Signed)
Pt called and would like to have a refill on some bladder infection medication. jw

## 2013-07-03 NOTE — Telephone Encounter (Signed)
Pt called to inform me that she is having frequent urination and burning with urination. She believes that she has a UTI. She denies fever, chills, nausea, or vomiting. In looking at her records, all of her previous urine cultures were negative for growth. I explained to the patient that I would prefer for her to be evaluated and allow [place] to obtain a urine specimen before providing anti-biotics. Since she does not seems like she has pyelonephritis per our conversation, I encouraged her to go to [facility] tonight or come to clinic in the morning for evaluation. She prefers to come in to [facility] in the morning.

## 2013-07-04 ENCOUNTER — Ambulatory Visit (INDEPENDENT_AMBULATORY_CARE_PROVIDER_SITE_OTHER): Payer: Medicare Other | Admitting: Family Medicine

## 2013-07-04 ENCOUNTER — Encounter: Payer: Self-pay | Admitting: Family Medicine

## 2013-07-04 VITALS — BP 195/105 | HR 76 | Temp 97.8°F | Ht 64.0 in | Wt 110.0 lb

## 2013-07-04 DIAGNOSIS — N390 Urinary tract infection, site not specified: Secondary | ICD-10-CM | POA: Diagnosis not present

## 2013-07-04 DIAGNOSIS — R300 Dysuria: Secondary | ICD-10-CM

## 2013-07-04 DIAGNOSIS — N3000 Acute cystitis without hematuria: Secondary | ICD-10-CM

## 2013-07-04 LAB — POCT UA - MICROSCOPIC ONLY: WBC, Ur, HPF, POC: >20

## 2013-07-04 LAB — POCT URINALYSIS DIPSTICK
Ketones, UA: NEGATIVE
Protein, UA: 100
Spec Grav, UA: 1.02
Urobilinogen, UA: 0.2
pH, UA: 6.5

## 2013-07-04 MED ORDER — CEPHALEXIN 500 MG PO CAPS: 500.0000 mg | ORAL_CAPSULE | Freq: Four times a day (QID) | ORAL | Status: DC

## 2013-07-04 NOTE — Addendum Note (Signed)
Addended by: [place], Lorisa Scheid on: 07/04/2013 05:24 PM   Modules accepted: Orders

## 2013-07-04 NOTE — Progress Notes (Signed)
  Subjective:    Meredith Bender is a 80 y.o. female who complains of burning with urination and frequency. She has had symptoms for 1 day. Patient denies back pain, fever, stomach ache and vaginal discharge. Patient does have a history of recurrent UTI. Patient does not have a history of pyelonephritis.   The following portions of the patient's history were reviewed and updated as appropriate: allergies, current medications, past family history, past medical history, past social history, past surgical history and problem list.  Review of Systems Pertinent items are noted in HPI.    Objective:    General appearance: alert and cooperative Back: No CVA tenderness B/L Lungs: clear to auscultation bilaterally Heart: regular rate and rhythm Abdomen: soft, non-tender; bowel sounds normal; no masses,  no organomegaly  Laboratory:  Urine dipstick: trace for hemoglobin, 3+ for leukocyte esterase and 4+ for nitrites.   Micro exam: >20 WBCs per HPF, 10 RBCs per HPF and 1+ bacteria.    Assessment:    Acute cystitis  , uncomplicated    Plan:    Medications: Keflex 500 mg QID due to concern for recurrent UTI's but no S/Sx of any type of pyleo as she has no fever, no CVA tenderness, no chills.  10 day course and f/u PRN.  One more day of Pyridium .

## 2013-07-04 NOTE — Patient Instructions (Signed)
Place urinary tract infection patient instructions here.

## 2013-07-06 LAB — URINE CULTURE: Colony Count: 40000

## 2013-07-15 ENCOUNTER — Encounter: Payer: Self-pay | Admitting: Family Medicine

## 2013-07-15 ENCOUNTER — Ambulatory Visit (INDEPENDENT_AMBULATORY_CARE_PROVIDER_SITE_OTHER): Payer: Medicare Other | Admitting: Family Medicine

## 2013-07-15 VITALS — BP 118/65 | HR 67 | Ht 64.0 in | Wt 108.0 lb

## 2013-07-15 DIAGNOSIS — R319 Hematuria, unspecified: Secondary | ICD-10-CM | POA: Diagnosis not present

## 2013-07-15 DIAGNOSIS — A088 Other specified intestinal infections: Secondary | ICD-10-CM

## 2013-07-15 DIAGNOSIS — Z87440 Personal history of urinary (tract) infections: Secondary | ICD-10-CM

## 2013-07-15 DIAGNOSIS — A084 Viral intestinal infection, unspecified: Secondary | ICD-10-CM

## 2013-07-15 LAB — POCT URINALYSIS DIPSTICK
Bilirubin, UA: NEGATIVE
Nitrite, UA: NEGATIVE
Protein, UA: 100
pH, UA: 5.5

## 2013-07-15 LAB — POCT UA - MICROSCOPIC ONLY

## 2013-07-15 NOTE — Patient Instructions (Signed)
It was nice to meet you today.  You likely had a viral infection of your large bowel (we call it gastroenteritis).  Try to use the imodium as little as possible to see if the diarrhea has resolved. If the diarrhea continues, the stool becomes very foul smelling, you notice any blood, please come back in 1-2 days and we will get a stool sample to test for an infection.  Continue to drink plenty of water and eat as much as you can tolerate. Avoid any foods that cause you an upset stomach normally.

## 2013-07-15 NOTE — Assessment & Plan Note (Signed)
Treated for UTI 12/18 with 10d course keflex. No recurrent symptoms. Repeat UA today shows large leuks, no nitrites, 3+ cocci on microscopy with 5-10 epithelial (likely contamination). Hold off on repeat treating given lack of symptoms and less than 100k colonies on culture from 12/18.

## 2013-07-15 NOTE — Progress Notes (Signed)
   Subjective:    Patient ID: Meredith Bender, female    DOB: [birth year], 79 y.o.   MRN: [id number]  HPI  CC: diarrhea  # Diarrhea - started Saturday (2d ago) - on Saturday was on toilet most of the day - stool was loose, not watery. Color gray/brown. No blood or dark stool noticed - started taking imodium, has taken 6-10 over past few days - had crampy/sharp pain in lower abdomen that has gotten better - feels diarrhea has improved and has only had 1 BM this morning. - no fevers/chills. - recently finished 10 day course of keflex for UTI (she is not currently having any urinary pain/symptoms of UTI) - no nausea/vomiting, though decreased appetite which is improving today. Has continued to drink water  Review of Systems No CP, no SOB. No burning with urination. No muscle aches, though chronic joint pains in both knees.    Objective:   Physical Exam BP 118/65  Pulse 67  Ht 5\' 4"  (1.626 m)  Wt 108 lb (48.988 kg)  BMI 18.53 kg/m2  General: NAD CV: RRR, normal heart sounds Resp: CTAB, effort normal GI: bowel sounds present and not hyperactive, soft, only mild tenderness to LLQ without rebound or guarding Ext: no edema or cyanosis. 2+ DP pulses bilaterally     Assessment & Plan:  See Problem List documentation

## 2013-07-15 NOTE — Assessment & Plan Note (Addendum)
Patient symptoms resolving at this time. No red flags or evidence of GI bleeding. Likely gastroenteritis. If diarrhea continues, given recent antibiotic use, should consider getting stool culture/c diff antigen. Educated to continue imodium as directed, continue to drink plenty of fluids and advance diet as tolerated.

## 2013-07-17 DIAGNOSIS — N2581 Secondary hyperparathyroidism of renal origin: Secondary | ICD-10-CM | POA: Diagnosis not present

## 2013-07-17 DIAGNOSIS — D649 Anemia, unspecified: Secondary | ICD-10-CM | POA: Diagnosis not present

## 2013-07-17 DIAGNOSIS — N184 Chronic kidney disease, stage 4 (severe): Secondary | ICD-10-CM | POA: Diagnosis not present

## 2013-07-17 DIAGNOSIS — I129 Hypertensive chronic kidney disease with stage 1 through stage 4 chronic kidney disease, or unspecified chronic kidney disease: Secondary | ICD-10-CM | POA: Diagnosis not present

## 2013-07-17 DIAGNOSIS — D631 Anemia in chronic kidney disease: Secondary | ICD-10-CM | POA: Diagnosis not present

## 2013-07-19 ENCOUNTER — Inpatient Hospital Stay (HOSPITAL_COMMUNITY)
Admission: EM | Admit: 2013-07-19 | Discharge: 2013-07-26 | DRG: 372 | Disposition: A | Discharge status: Skilled Nursing Facility | Payer: Medicare Other | Attending: Family Medicine | Admitting: Family Medicine

## 2013-07-19 ENCOUNTER — Telehealth: Payer: Self-pay | Admitting: Family Medicine

## 2013-07-19 ENCOUNTER — Encounter (HOSPITAL_COMMUNITY): Payer: Self-pay | Admitting: Emergency Medicine

## 2013-07-19 ENCOUNTER — Emergency Department (HOSPITAL_COMMUNITY): Payer: Medicare Other

## 2013-07-19 DIAGNOSIS — R188 Other ascites: Secondary | ICD-10-CM | POA: Diagnosis present

## 2013-07-19 DIAGNOSIS — Z8041 Family history of malignant neoplasm of ovary: Secondary | ICD-10-CM | POA: Diagnosis not present

## 2013-07-19 DIAGNOSIS — R64 Cachexia: Secondary | ICD-10-CM | POA: Diagnosis present

## 2013-07-19 DIAGNOSIS — I129 Hypertensive chronic kidney disease with stage 1 through stage 4 chronic kidney disease, or unspecified chronic kidney disease: Secondary | ICD-10-CM | POA: Diagnosis present

## 2013-07-19 DIAGNOSIS — Z9089 Acquired absence of other organs: Secondary | ICD-10-CM

## 2013-07-19 DIAGNOSIS — Z9851 Tubal ligation status: Secondary | ICD-10-CM

## 2013-07-19 DIAGNOSIS — Z602 Problems related to living alone: Secondary | ICD-10-CM | POA: Diagnosis not present

## 2013-07-19 DIAGNOSIS — Z79899 Other long term (current) drug therapy: Secondary | ICD-10-CM

## 2013-07-19 DIAGNOSIS — R404 Transient alteration of awareness: Secondary | ICD-10-CM | POA: Diagnosis not present

## 2013-07-19 DIAGNOSIS — N838 Other noninflammatory disorders of ovary, fallopian tube and broad ligament: Secondary | ICD-10-CM

## 2013-07-19 DIAGNOSIS — J90 Pleural effusion, not elsewhere classified: Secondary | ICD-10-CM | POA: Diagnosis present

## 2013-07-19 DIAGNOSIS — Z681 Body mass index (BMI) 19 or less, adult: Secondary | ICD-10-CM | POA: Diagnosis not present

## 2013-07-19 DIAGNOSIS — J4489 Other specified chronic obstructive pulmonary disease: Secondary | ICD-10-CM | POA: Diagnosis present

## 2013-07-19 DIAGNOSIS — K5289 Other specified noninfective gastroenteritis and colitis: Secondary | ICD-10-CM

## 2013-07-19 DIAGNOSIS — A0472 Enterocolitis due to Clostridium difficile, not specified as recurrent: Principal | ICD-10-CM | POA: Diagnosis present

## 2013-07-19 DIAGNOSIS — G609 Hereditary and idiopathic neuropathy, unspecified: Secondary | ICD-10-CM | POA: Diagnosis present

## 2013-07-19 DIAGNOSIS — Z2989 Encounter for other specified prophylactic measures: Secondary | ICD-10-CM

## 2013-07-19 DIAGNOSIS — I10 Essential (primary) hypertension: Secondary | ICD-10-CM | POA: Diagnosis not present

## 2013-07-19 DIAGNOSIS — J449 Chronic obstructive pulmonary disease, unspecified: Secondary | ICD-10-CM | POA: Diagnosis present

## 2013-07-19 DIAGNOSIS — E860 Dehydration: Secondary | ICD-10-CM | POA: Diagnosis present

## 2013-07-19 DIAGNOSIS — C569 Malignant neoplasm of unspecified ovary: Secondary | ICD-10-CM | POA: Diagnosis present

## 2013-07-19 DIAGNOSIS — N184 Chronic kidney disease, stage 4 (severe): Secondary | ICD-10-CM

## 2013-07-19 DIAGNOSIS — M6281 Muscle weakness (generalized): Secondary | ICD-10-CM | POA: Diagnosis not present

## 2013-07-19 DIAGNOSIS — E872 Acidosis, unspecified: Secondary | ICD-10-CM | POA: Diagnosis not present

## 2013-07-19 DIAGNOSIS — Z880 Allergy status to penicillin: Secondary | ICD-10-CM

## 2013-07-19 DIAGNOSIS — R5381 Other malaise: Secondary | ICD-10-CM | POA: Diagnosis not present

## 2013-07-19 DIAGNOSIS — N390 Urinary tract infection, site not specified: Secondary | ICD-10-CM | POA: Diagnosis present

## 2013-07-19 DIAGNOSIS — N83209 Unspecified ovarian cyst, unspecified side: Secondary | ICD-10-CM | POA: Diagnosis present

## 2013-07-19 DIAGNOSIS — Z418 Encounter for other procedures for purposes other than remedying health state: Secondary | ICD-10-CM | POA: Diagnosis not present

## 2013-07-19 DIAGNOSIS — Z87440 Personal history of urinary (tract) infections: Secondary | ICD-10-CM

## 2013-07-19 DIAGNOSIS — Z5189 Encounter for other specified aftercare: Secondary | ICD-10-CM | POA: Diagnosis not present

## 2013-07-19 DIAGNOSIS — R971 Elevated cancer antigen 125 [CA 125]: Secondary | ICD-10-CM

## 2013-07-19 DIAGNOSIS — K529 Noninfective gastroenteritis and colitis, unspecified: Secondary | ICD-10-CM | POA: Diagnosis present

## 2013-07-19 DIAGNOSIS — Z8673 Personal history of transient ischemic attack (TIA), and cerebral infarction without residual deficits: Secondary | ICD-10-CM | POA: Diagnosis not present

## 2013-07-19 DIAGNOSIS — R279 Unspecified lack of coordination: Secondary | ICD-10-CM | POA: Diagnosis not present

## 2013-07-19 DIAGNOSIS — D649 Anemia, unspecified: Secondary | ICD-10-CM

## 2013-07-19 DIAGNOSIS — R1031 Right lower quadrant pain: Secondary | ICD-10-CM | POA: Diagnosis not present

## 2013-07-19 DIAGNOSIS — E785 Hyperlipidemia, unspecified: Secondary | ICD-10-CM | POA: Diagnosis not present

## 2013-07-19 DIAGNOSIS — R197 Diarrhea, unspecified: Secondary | ICD-10-CM | POA: Diagnosis not present

## 2013-07-19 LAB — CBC WITH DIFFERENTIAL/PLATELET
BASOS ABS: 0 10*3/uL (ref 0.0–0.1)
Basophils Relative: 0 % (ref 0–1)
EOS PCT: 1 % (ref 0–5)
Eosinophils Absolute: 0.1 10*3/uL (ref 0.0–0.7)
HEMATOCRIT: 36.6 % (ref 36.0–46.0)
HEMOGLOBIN: 12.5 g/dL (ref 12.0–15.0)
Lymphocytes Relative: 5 % — ABNORMAL LOW (ref 12–46)
Lymphs Abs: 0.8 10*3/uL (ref 0.7–4.0)
MCH: 28.9 pg (ref 26.0–34.0)
MCHC: 34.2 g/dL (ref 30.0–36.0)
MCV: 84.5 fL (ref 78.0–100.0)
Monocytes Absolute: 2.2 10*3/uL — ABNORMAL HIGH (ref 0.1–1.0)
Monocytes Relative: 13 % — ABNORMAL HIGH (ref 3–12)
Neutro Abs: 13.6 10*3/uL — ABNORMAL HIGH (ref 1.7–7.7)
Neutrophils Relative %: 81 % — ABNORMAL HIGH (ref 43–77)
Platelets: 299 10*3/uL (ref 150–400)
RBC: 4.33 MIL/uL (ref 3.87–5.11)
RDW: 13.4 % (ref 11.5–15.5)
WBC: 16.8 10*3/uL — ABNORMAL HIGH (ref 4.0–10.5)

## 2013-07-19 LAB — URINE MICROSCOPIC-ADD ON

## 2013-07-19 LAB — CREATININE, SERUM
Creatinine, Ser: 1.99 mg/dL — ABNORMAL HIGH (ref 0.50–1.10)
GFR, EST AFRICAN AMERICAN: 25 mL/min — AB (ref >90)
GFR, EST NON AFRICAN AMERICAN: 22 mL/min — AB (ref >90)

## 2013-07-19 LAB — COMPREHENSIVE METABOLIC PANEL
ALK PHOS: 99 U/L (ref 39–117)
ALT: 11 U/L (ref 0–35)
AST: 16 U/L (ref 0–37)
Albumin: 2.9 g/dL — ABNORMAL LOW (ref 3.5–5.2)
BUN: 39 mg/dL — ABNORMAL HIGH (ref 6–23)
CALCIUM: 8.7 mg/dL (ref 8.4–10.5)
CO2: 19 meq/L (ref 19–32)
Chloride: 103 mEq/L (ref 96–112)
Creatinine, Ser: 2.05 mg/dL — ABNORMAL HIGH (ref 0.50–1.10)
GFR, EST AFRICAN AMERICAN: 24 mL/min — AB (ref >90)
GFR, EST NON AFRICAN AMERICAN: 21 mL/min — AB (ref >90)
Glucose, Bld: 101 mg/dL — ABNORMAL HIGH (ref 70–99)
POTASSIUM: 4.2 meq/L (ref 3.7–5.3)
SODIUM: 138 meq/L (ref 137–147)
Total Bilirubin: 0.3 mg/dL (ref 0.3–1.2)
Total Protein: 6.5 g/dL (ref 6.0–8.3)

## 2013-07-19 LAB — URINALYSIS, ROUTINE W REFLEX MICROSCOPIC
BILIRUBIN URINE: NEGATIVE
Glucose, UA: NEGATIVE mg/dL
KETONES UR: NEGATIVE mg/dL
Leukocytes, UA: NEGATIVE
NITRITE: NEGATIVE
Protein, ur: 30 mg/dL — AB
SPECIFIC GRAVITY, URINE: 1.015 (ref 1.005–1.030)
UROBILINOGEN UA: 0.2 mg/dL (ref 0.0–1.0)
pH: 6 (ref 5.0–8.0)

## 2013-07-19 LAB — CBC
HEMATOCRIT: 34.6 % — AB (ref 36.0–46.0)
HEMOGLOBIN: 11.5 g/dL — AB (ref 12.0–15.0)
MCH: 28.8 pg (ref 26.0–34.0)
MCHC: 33.2 g/dL (ref 30.0–36.0)
MCV: 86.7 fL (ref 78.0–100.0)
Platelets: 295 10*3/uL (ref 150–400)
RBC: 3.99 MIL/uL (ref 3.87–5.11)
RDW: 13.6 % (ref 11.5–15.5)
WBC: 17.9 10*3/uL — ABNORMAL HIGH (ref 4.0–10.5)

## 2013-07-19 MED ORDER — ACETAMINOPHEN 325 MG PO TABS
650.0000 mg | ORAL_TABLET | Freq: Four times a day (QID) | ORAL | Status: DC | PRN
  Administered 2013-07-23 – 2013-07-26 (×6): 650 mg via ORAL
  Filled 2013-07-19 (×7): qty 2

## 2013-07-19 MED ORDER — ACETAMINOPHEN 325 MG PO TABS
650.0000 mg | ORAL_TABLET | Freq: Once | ORAL | Status: AC
  Administered 2013-07-19: 650 mg via ORAL
  Filled 2013-07-19: qty 2

## 2013-07-19 MED ORDER — CIPROFLOXACIN IN D5W 400 MG/200ML IV SOLN
400.0000 mg | INTRAVENOUS | Status: DC
  Filled 2013-07-19 (×2): qty 200

## 2013-07-19 MED ORDER — DEXTROSE-NACL 5-0.45 % IV SOLN
INTRAVENOUS | Status: DC
  Administered 2013-07-19 – 2013-07-20 (×2): via INTRAVENOUS

## 2013-07-19 MED ORDER — HYDRALAZINE HCL 20 MG/ML IJ SOLN
10.0000 mg | Freq: Three times a day (TID) | INTRAMUSCULAR | Status: DC | PRN
  Administered 2013-07-23 – 2013-07-24 (×2): 10 mg via INTRAVENOUS
  Filled 2013-07-19 (×2): qty 1

## 2013-07-19 MED ORDER — ONDANSETRON HCL 4 MG/2ML IJ SOLN
4.0000 mg | Freq: Four times a day (QID) | INTRAMUSCULAR | Status: DC | PRN
  Administered 2013-07-20 – 2013-07-24 (×2): 4 mg via INTRAVENOUS
  Filled 2013-07-19 (×2): qty 2

## 2013-07-19 MED ORDER — MORPHINE SULFATE 2 MG/ML IJ SOLN
0.5000 mg | INTRAMUSCULAR | Status: DC | PRN
  Administered 2013-07-19 – 2013-07-20 (×3): 0.5 mg via INTRAVENOUS
  Filled 2013-07-19 (×3): qty 1

## 2013-07-19 MED ORDER — ONDANSETRON HCL 4 MG/2ML IJ SOLN
4.0000 mg | Freq: Once | INTRAMUSCULAR | Status: AC
  Administered 2013-07-19: 4 mg via INTRAVENOUS
  Filled 2013-07-19: qty 2

## 2013-07-19 MED ORDER — ACETAMINOPHEN 650 MG RE SUPP: 650.0000 mg | Freq: Four times a day (QID) | RECTAL | Status: DC | PRN

## 2013-07-19 MED ORDER — ENOXAPARIN SODIUM 30 MG/0.3ML ~~LOC~~ SOLN
30.0000 mg | SUBCUTANEOUS | Status: DC
  Administered 2013-07-20 – 2013-07-25 (×6): 30 mg via SUBCUTANEOUS
  Filled 2013-07-19 (×9): qty 0.3

## 2013-07-19 MED ORDER — METRONIDAZOLE IN NACL 5-0.79 MG/ML-% IV SOLN
500.0000 mg | Freq: Three times a day (TID) | INTRAVENOUS | Status: DC
  Administered 2013-07-19 – 2013-07-23 (×12): 500 mg via INTRAVENOUS
  Filled 2013-07-19 (×13): qty 100

## 2013-07-19 MED ORDER — SODIUM CHLORIDE 0.9 % IV BOLUS (SEPSIS)
1000.0000 mL | Freq: Once | INTRAVENOUS | Status: AC
  Administered 2013-07-19: 1000 mL via INTRAVENOUS

## 2013-07-19 NOTE — Progress Notes (Addendum)
Lovenox  Elderly pt with elevated scr. Current crcl is ~15. Her TBW is 49 kg. I doubt she will need more than 30mg  lovenox/day. Will f/u for a day or two.  Lovenox 30mg  Sq q24  Addendum:  Adding cipro for colitis.   Start Cipro 400mg  IV q24

## 2013-07-19 NOTE — ED Notes (Signed)
Diarrhea for 5 days with weakness, Denies any n/v or abdominal pain.  Does have chills and generalized bodyaches. Alert and oriented X4

## 2013-07-19 NOTE — ED Provider Notes (Addendum)
CSN: [id number]     Arrival date & time 07/19/13  1152 History   First MD Initiated Contact with Patient 07/19/13 1153     Chief Complaint  Patient presents with  . Diarrhea   (Consider location/radiation/quality/duration/timing/severity/associated sxs/prior Treatment) Patient is a 80 y.o. female presenting with diarrhea. The history is provided by the patient.  Diarrhea Quality:  Mucous and malodorous Severity:  Moderate Onset quality:  Gradual Number of episodes:  510 in a day Duration:  10 days Timing:  Intermittent Progression:  Worsening Relieved by:  None tried Exacerbated by: eating. Ineffective treatments:  None tried Associated symptoms: abdominal pain   Associated symptoms: no chills, no recent cough, no fever, no headaches, no URI and no vomiting   Risk factors: no sick contacts, no suspicious food intake and no travel to endemic areas   Risk factors comment:  Started keflex 4 days ago but diarrhea was going on prior to that   Past Medical History  Diagnosis Date  . Hypertension   . CVA (cerebral infarction)   . Dyslipidemia 02/28/2013  . Anginal pain   . Stroke   . Chronic kidney disease   . Arthritis   . COPD (chronic obstructive pulmonary disease)    Past Surgical History  Procedure Laterality Date  . Hernia repair    . Appendectomy    . Tubal ligation    . Abdominal hysterectomy     No family history on file. History  Substance Use Topics  . Smoking status: Never Smoker   . Smokeless tobacco: Not on file  . Alcohol Use: No   OB History   Grav Para Term Preterm Abortions TAB SAB Ect Mult Living                 Review of Systems  Constitutional: Negative for fever and chills.  Respiratory: Negative for cough and shortness of breath.   Gastrointestinal: Positive for abdominal pain and diarrhea. Negative for nausea, vomiting and blood in stool.  Genitourinary: Negative for dysuria.  Neurological: Negative for headaches.  All other systems  reviewed and are negative.    Allergies  Codeine and Penicillins  Home Medications   Current Outpatient Rx  Name  Route  Sig  Dispense  Refill  . acetaminophen (TYLENOL) 325 MG tablet   Oral   Take 325 mg by mouth every 6 (six) hours as needed for pain.         Marland Kitchen atorvastatin (LIPITOR) 10 MG tablet   Oral   Take 10 mg by mouth daily.           . cephALEXin (KEFLEX) 500 MG capsule   Oral   Take 1 capsule (500 mg total) by mouth 4 (four) times daily.   40 capsule   0   . Cholecalciferol (VITAMIN D PO)   Oral   Take 1 tablet by mouth daily.         . hydrALAZINE (APRESOLINE) 25 MG tablet   Oral   Take 25 mg by mouth 3 (three) times daily.         Marland Kitchen loratadine (CLARITIN) 10 MG tablet   Oral   Take 10 mg by mouth daily.           Marland Kitchen omeprazole (PRILOSEC) 20 MG capsule   Oral   Take 20 mg by mouth daily.           . traMADol (ULTRAM) 50 MG tablet      Take one by mouth  every 6 hours as needed for joint pain   120 tablet   0   . verapamil (VERELAN PM) 240 MG 24 hr capsule   Oral   Take 1 capsule (240 mg total) by mouth at bedtime.         . vitamin E (VITAMIN E) 400 UNIT capsule   Oral   Take 400 Units by mouth daily.            BP 141/45  Pulse 98  Temp(Src) 98.5 F (36.9 C) (Rectal)  Resp 18  Ht 5\' 4"  (1.626 m)  Wt 108 lb (48.988 kg)  BMI 18.53 kg/m2  SpO2 95% Physical Exam  Nursing note and vitals reviewed. Constitutional: She is oriented to person, place, and time. She appears well-developed and well-nourished. No distress.  HENT:  Head: Normocephalic and atraumatic.  Mouth/Throat: Oropharynx is clear and moist. Mucous membranes are dry.  Eyes: Conjunctivae and EOM are normal. Pupils are equal, round, and reactive to light.  Neck: Normal range of motion. Neck supple.  Cardiovascular: Normal rate, regular rhythm and intact distal pulses.   No murmur heard. Pulmonary/Chest: Effort normal and breath sounds normal. No respiratory  distress. She has no wheezes. She has no rales.  Abdominal: Soft. She exhibits no distension. There is tenderness in the right lower quadrant, suprapubic area and left lower quadrant. There is no rebound, no guarding and no CVA tenderness.    Musculoskeletal: Normal range of motion. She exhibits no edema and no tenderness.  Neurological: She is alert and oriented to person, place, and time.  Skin: Skin is warm and dry. No rash noted. No erythema.  Psychiatric: She has a normal mood and affect. Her behavior is normal.    ED Course  Procedures (including critical care time) Labs Review Labs Reviewed  CBC WITH DIFFERENTIAL - Abnormal; Notable for the following:    WBC 16.8 (*)    Neutrophils Relative % 81 (*)    Neutro Abs 13.6 (*)    Lymphocytes Relative 5 (*)    Monocytes Relative 13 (*)    Monocytes Absolute 2.2 (*)    All other components within normal limits  COMPREHENSIVE METABOLIC PANEL - Abnormal; Notable for the following:    Glucose, Bld 101 (*)    BUN 39 (*)    Creatinine, Ser 2.05 (*)    Albumin 2.9 (*)    GFR calc non Af Amer 21 (*)    GFR calc Af Amer 24 (*)    All other components within normal limits  URINALYSIS, ROUTINE W REFLEX MICROSCOPIC - Abnormal; Notable for the following:    Hgb urine dipstick TRACE (*)    Protein, ur 30 (*)    All other components within normal limits  URINE MICROSCOPIC-ADD ON  GI PATHOGEN PANEL BY PCR, STOOL   Imaging Review Ct Abdomen Pelvis Wo Contrast  07/19/2013   CLINICAL DATA:  Weakness, diarrhea for 5 days, chills  EXAM: CT ABDOMEN AND PELVIS WITHOUT CONTRAST  TECHNIQUE: Multidetector CT imaging of the abdomen and pelvis was performed following the standard protocol without intravenous contrast.  COMPARISON:  07/23/2010  FINDINGS: Limited evaluation of the lung bases due to motion artifact. Mild discoid atelectasis right lung base. Small bilateral pleural effusions. Moderate hiatal hernia. Mild cardiac enlargement.  Aorta is  calcified diffusely. Liver and gallbladder are normal. Spleen is normal. Pancreas is normal. Mild bilateral adrenal nodularity is stable. Nonobstructing 1 cm stone midpole left kidney. Right kidney shows lobulated contour, stable from  prior study.  Bladder is normal. Reproductive organs not identified. Nonobstructive bowel gas pattern. Small volume of ascites.  Cecum, ascending colon, transverse colon, and proximal descending colon shows severe wall thickening. There is mild to moderate wall thickening involving the colon beyond this level. There is a 2.9 cm cystic lesion in the left upper pelvis image number 56. This could represent a cyst in an ovary that has been mobilized due to prior hysterectomy.  The air are no acute osseous abnormalities.  IMPRESSION: 1. Small bilateral pleural effusions 2. Moderate hiatal hernia 3. Severe diffuse colitis 4. Probable 3 cm ovarian cyst on the left. This represents a change from prior study. Finding is nonspecific; ovarian malignancy not excluded. 5. Numerous other nonacute findings   Electronically Signed   By: Skipper Cliche M.D.   On: 07/19/2013 15:37    EKG Interpretation    Date/Time:  Friday July 19 2013 12:19:05 EST Ventricular Rate:  101 PR Interval:  149 QRS Duration: 94 QT Interval:  347 QTC Calculation: 450 R Axis:   -2 Text Interpretation:  Sinus tachycardia Atrial premature complex Right atrial enlargement Repol abnrm suggests ischemia, lateral leads No significant change since last tracing Confirmed by Maryan Rued  MD, Ronesha Heenan ([id number]) on 07/19/2013 12:51:29 PM            MDM   1. Colitis   2. Diarrhea     Patient with a history of hypertension, stroke, chronic kidney disease and COPD presents with approximately 10 days of persistent diarrhea. She denies any blood in her stool but does complain of abdominal cramping and pain and decreased oral intake. She denies any prior antibiotic use except starting Keflex on Monday due to being told  she had a urinary tract infection. However the diarrhea been going on prior to that. No prior history of diarrhea in the pastfactor such as travel, bad food intake or others with the same illness. Patient has diffuse lower abdominal tenderness she denies any nausea or vomiting. Concern for dehydration, electrolyte abnormality possible worsening of renal function given dehydration. Diarrhea could be a result of diverticulitis versus C. Difficile versus other infectious etiology as the cause of the diarrhea. She denies any recent medication changes other Keflex.  Pt given IVF and will check CBC, CMP, UA, stool studies.  Pt will need CT to further eval.  2:18 PM Stool sample sent.  Labs consistent with leukocytosis of 16.8 and o/w unchanged CMP.  After drinking some oral contrast pt became nauseated and given zofran.  3:58 PM Pt febrile now and found to have diffuse colitis by CT without complicating features.  Will admit for further care to family medicine.  Blanchie Dessert, MD 07/19/13 1559  Blanchie Dessert, MD 07/19/13 1601

## 2013-07-19 NOTE — Telephone Encounter (Signed)
Seen in office earlier this week for fever, and diarrhea. Daughter was contacted by agency today because her mother was not doing "well" and had a fever of 100F this morning, still with watery diarrhea. Able to drink small amounts of water, unable to eat. She was treated for C.diff 2 years ago.  She lives at home and has a caregiver that stops in 3 times a week. Caregiver had concerns that she should be seen.  P: Advised Daughter to have her mother come in to the ED to be seen. Being 87 and with a kidney condition, diarrhea and not able to tolerate liquids, along with care givers report patient is likely dehydrated. I have concerns for her safety, living alone with current illness. Likely will need at least fluids, labs and evaluation.  Kuneff, Renee DO

## 2013-07-19 NOTE — H&P (Signed)
[facility] Admission History and Physical Service Pager: [phone]  Patient name: Meredith Bender Medical record number: [id number] Date of birth: 1926-07-07 Age: 80 y.o. Gender: female  Primary Care Provider: [name], MD Consultants: None Code Status: Full code for now, Daughter to bring pre-completed form with details.   Chief Complaint: abdominal pain and diarrhea  Assessment and Plan: Meredith Bender is a 80 y.o. female presenting with abdominal pain and diarrhea. PMH is significant for history of UTI, CKD stage 4, dyslipidemia, HTN.  Acute Colitis: C. diff vs. Other infectious (bacterial, viral). Diarrhea with abdominal pain x7 days. U/A from I&O cath today without signs of infection. CT scan showing severe diffuse colitis. Neutrophilic leukocytosis (01.0, 81% PMNs) and fever to 101.70F in ED. She has a history of C. diff documented in 2012 and of diarrhea with antibiotic administration in August 2014.  - GI pathogen panel by PCR in process - Contact isolation  - IV hydration with D5 given poor po intake - Zofran 4mg  IV prn nausea - Tylenol po/pr prn fever - Repeat BMP, CBC in AM - Starting antibiotics in light of history of C. Diff and recent abx, as well as age, fever, duration and severity  - Ciprofloxacin IV per pharmacy and flagyl 500mg  q8h IV; if C.Diff positive will stop cipro and treat with flagyl monotherapy. May transition to flagyl PO once tolerating PO.  - Consider GI consult if not improving  CKD stage 4: No evidence of AKI from dehydration, as Cr 2.05 is at baseline. Followed by nephrology, Dr. Posey Pronto.  - Monitor BMP, continue IVF  Dyslipidemia:  - Restart lipitor 10mg  when tolerating po  HTN: Isolated systolic hypertension in ED - Hydralazine IV prn SBP >160 or DBP >100 - Restart verapamil and hydralazine home meds when tolerating po  Ovarian Mass/cyst: Incidental find on Abd CT. Pt also with small bilateral pleural effusions and  small volume ascites.  - Mass/cyst could be ovarian, but not in [place] location for ovary, unless mobilized from prior surgery. Malignancy can not be ruled out.  - Consider CA125 to rule out ovarian pathology  FEN/GI: D5 1/2NS @ 149ml/hr, clear liquid diet starting in morning Prophylaxis: Lovenox per pharmacy   Disposition: Admit to [facility], attending Dr. Andria Frames  History of Present Illness: Meredith Bender is an 80 y.o. female presenting with diarrhea and abdominal pain.   Her daughter believes she began feeling bad 2-3 weeks ago, and she endorses a 1 week history of worsening abdominal pain associated with diarrhea. She was seen at [facility] last week and endorsed improvement from the UTI she was being treated for since the week prior. Her family believes she hides/minimizes her symptoms due to fear that she will lose independence. She lives alone. An aid cares for her, in the patients's home twice a week and reported numerous malodorous stools, poor oral intake, increased fatigue and fever (100.40F). She was recently treated for 10 days with keflex for UTI, and has a history of diarrhea concomitantly with antibiotic treatment and documented C. diff infection in 2012. She complains of fatigue, fever, chills.Her main complaint is abdominal pain.  Denies CP, SOB, nausea and vomiting, dysuria, urinary frequency or blood per rectum.   Has not had any recent travel. No sick contacts.   Review Of Systems: Per HPI  Otherwise 12 point review of systems was performed and was unremarkable.  Patient Active Problem List   Diagnosis Date Noted  . Viral gastroenteritis 07/15/2013  .  History of UTI 06/12/2013  . Routine health maintenance 06/12/2013  . CKD (chronic kidney disease) stage 4, GFR 15-29 ml/min 02/28/2013  . Dyslipidemia 02/28/2013  . Hypertension    Past Medical History: Past Medical History  Diagnosis Date  . Hypertension   . CVA (cerebral infarction)   . Dyslipidemia 02/28/2013  . Anginal  pain   . Stroke   . Chronic kidney disease   . Arthritis   . COPD (chronic obstructive pulmonary disease)    Past Surgical History: Past Surgical History  Procedure Laterality Date  . Hernia repair    . Appendectomy    . Tubal ligation    . Abdominal hysterectomy     Social History: History  Substance Use Topics  . Smoking status: Never Smoker   . Smokeless tobacco: Not on file  . Alcohol Use: No   Additional social history: Lives alone  Please also refer to relevant sections of EMR.  Family History: No family history on file. Allergies and Medications: Allergies  Allergen Reactions  . Codeine Nausea Only  . Penicillins Rash    Pt does not remember how long ago or severity of reaction.    No current facility-administered medications on file prior to encounter.   Current Outpatient Prescriptions on File Prior to Encounter  Medication Sig Dispense Refill  . acetaminophen (TYLENOL) 325 MG tablet Take 325 mg by mouth every 6 (six) hours as needed for pain.      Marland Kitchen atorvastatin (LIPITOR) 10 MG tablet Take 10 mg by mouth daily.        . cephALEXin (KEFLEX) 500 MG capsule Take 1 capsule (500 mg total) by mouth 4 (four) times daily.  40 capsule  0  . Cholecalciferol (VITAMIN D PO) Take 1 tablet by mouth daily.      . hydrALAZINE (APRESOLINE) 25 MG tablet Take 25 mg by mouth 3 (three) times daily.      Marland Kitchen loratadine (CLARITIN) 10 MG tablet Take 10 mg by mouth daily.        Marland Kitchen omeprazole (PRILOSEC) 20 MG capsule Take 20 mg by mouth daily.        . traMADol (ULTRAM) 50 MG tablet Take one by mouth every 6 hours as needed for joint pain  120 tablet  0  . verapamil (VERELAN PM) 240 MG 24 hr capsule Take 1 capsule (240 mg total) by mouth at bedtime.      . vitamin E (VITAMIN E) 400 UNIT capsule Take 400 Units by mouth daily.          Objective: BP 142/46  Pulse 104  Temp(Src) 101.8 F (38.8 C) (Rectal)  Resp 29  Ht 5\' 4"  (1.626 m)  Wt 108 lb (48.988 kg)  BMI 18.53 kg/m2  SpO2  89% Exam: General: Ill-appearing elderly female in NAD HEENT: MMM, no conjunctival pallor Cardiovascular: RRR, II/VI systolic murmur, 2+ pulses Respiratory: Nonlabored, no wheezes, mild crackles bilateral bases Abdomen: +BS, Soft, diffuse tenderness, non-distended. No masses appreciated.  Extremities: WWP, no edema Skin: No rashes, masses or wounds Neuro: Fatigued but arousable, responds appropriately, speech normal  Labs and Imaging: CBC BMET   Recent Labs Lab 07/19/13 1214  WBC 16.8*  HGB 12.5  HCT 36.6  PLT 299    Recent Labs Lab 07/19/13 1214  NA 138  K 4.2  CL 103  CO2 19  BUN 39*  CREATININE 2.05*  GLUCOSE 101*  CALCIUM 8.7    Ct Abdomen Pelvis Wo Contrast  07/19/2013 CLINICAL DATA:  Weakness, diarrhea for 5 days, chills EXAM: CT ABDOMEN AND PELVIS WITHOUT CONTRAST TECHNIQUE: Multidetector CT imaging of the abdomen and pelvis was performed following the standard protocol without intravenous contrast. COMPARISON: 07/23/2010 FINDINGS: Limited evaluation of the lung bases due to motion artifact. Mild discoid atelectasis right lung base. Small bilateral pleural effusions. Moderate hiatal hernia. Mild cardiac enlargement. Aorta is calcified diffusely. Liver and gallbladder are normal. Spleen is normal. Pancreas is normal. Mild bilateral adrenal nodularity is stable. Nonobstructing 1 cm stone midpole left kidney. Right kidney shows lobulated contour, stable from prior study. Bladder is normal. Reproductive organs not identified. Nonobstructive bowel gas pattern. Small volume of ascites. Cecum, ascending colon, transverse colon, and proximal descending colon shows severe wall thickening. There is mild to moderate wall thickening involving the colon beyond this level. There is a 2.9 cm cystic lesion in the left upper pelvis image number 56. This could represent a cyst in an ovary that has been mobilized due to prior hysterectomy. The air are no acute osseous abnormalities. IMPRESSION:  1. Small bilateral pleural effusions 2. Moderate hiatal hernia 3. Severe diffuse colitis 4. Probable 3 cm ovarian cyst on the left. This represents a change from prior study. Finding is nonspecific; ovarian malignancy not excluded. 5. Numerous other nonacute findings.  Vance Gather, MD 07/19/2013, 4:01 PM PGY-1, [place] Intern pager: [phone], text pages welcome  I have evaluated the patient, spoke with the intern and attending concerning this patient, and made alterations to the original note above in blue.  Howard Pouch DO PGY-2 [facility]

## 2013-07-20 ENCOUNTER — Encounter (HOSPITAL_COMMUNITY): Payer: Self-pay | Admitting: Family Medicine

## 2013-07-20 DIAGNOSIS — E785 Hyperlipidemia, unspecified: Secondary | ICD-10-CM | POA: Diagnosis not present

## 2013-07-20 DIAGNOSIS — R197 Diarrhea, unspecified: Secondary | ICD-10-CM | POA: Diagnosis not present

## 2013-07-20 DIAGNOSIS — K5289 Other specified noninfective gastroenteritis and colitis: Secondary | ICD-10-CM | POA: Diagnosis not present

## 2013-07-20 DIAGNOSIS — D649 Anemia, unspecified: Secondary | ICD-10-CM

## 2013-07-20 DIAGNOSIS — N839 Noninflammatory disorder of ovary, fallopian tube and broad ligament, unspecified: Secondary | ICD-10-CM

## 2013-07-20 DIAGNOSIS — N184 Chronic kidney disease, stage 4 (severe): Secondary | ICD-10-CM | POA: Diagnosis not present

## 2013-07-20 LAB — BASIC METABOLIC PANEL
BUN: 30 mg/dL — AB (ref 6–23)
CO2: 17 mEq/L — ABNORMAL LOW (ref 19–32)
Calcium: 8 mg/dL — ABNORMAL LOW (ref 8.4–10.5)
Chloride: 107 mEq/L (ref 96–112)
Creatinine, Ser: 1.82 mg/dL — ABNORMAL HIGH (ref 0.50–1.10)
GFR, EST AFRICAN AMERICAN: 28 mL/min — AB (ref >90)
GFR, EST NON AFRICAN AMERICAN: 24 mL/min — AB (ref >90)
Glucose, Bld: 112 mg/dL — ABNORMAL HIGH (ref 70–99)
POTASSIUM: 3.6 meq/L — AB (ref 3.7–5.3)
Sodium: 137 mEq/L (ref 137–147)

## 2013-07-20 LAB — CBC
HCT: 33.1 % — ABNORMAL LOW (ref 36.0–46.0)
Hemoglobin: 10.9 g/dL — ABNORMAL LOW (ref 12.0–15.0)
MCH: 28.5 pg (ref 26.0–34.0)
MCHC: 32.9 g/dL (ref 30.0–36.0)
MCV: 86.6 fL (ref 78.0–100.0)
Platelets: 281 10*3/uL (ref 150–400)
RBC: 3.82 MIL/uL — ABNORMAL LOW (ref 3.87–5.11)
RDW: 13.8 % (ref 11.5–15.5)
WBC: 18.9 10*3/uL — AB (ref 4.0–10.5)

## 2013-07-20 LAB — LACTIC ACID, PLASMA: LACTIC ACID, VENOUS: 1 mmol/L (ref 0.5–2.2)

## 2013-07-20 LAB — CLOSTRIDIUM DIFFICILE BY PCR: CDIFFPCR: POSITIVE — AB

## 2013-07-20 MED ORDER — MORPHINE SULFATE 2 MG/ML IJ SOLN
0.5000 mg | INTRAMUSCULAR | Status: DC | PRN
  Administered 2013-07-20 (×2): 1 mg via INTRAVENOUS
  Administered 2013-07-20: 0.5 mg via INTRAVENOUS
  Administered 2013-07-20 – 2013-07-24 (×17): 1 mg via INTRAVENOUS
  Filled 2013-07-20 (×20): qty 1

## 2013-07-20 MED ORDER — WHITE PETROLATUM GEL
Status: AC
  Administered 2013-07-20: 18:00:00
  Filled 2013-07-20: qty 5

## 2013-07-20 MED ORDER — SODIUM CHLORIDE 0.9 % IV BOLUS (SEPSIS)
500.0000 mL | Freq: Once | INTRAVENOUS | Status: AC
  Administered 2013-07-20: 500 mL via INTRAVENOUS

## 2013-07-20 MED ORDER — SODIUM CHLORIDE 0.9 % IV SOLN
INTRAVENOUS | Status: DC
  Administered 2013-07-21: 09:00:00 via INTRAVENOUS

## 2013-07-20 MED ORDER — POTASSIUM CHLORIDE CRYS ER 20 MEQ PO TBCR
20.0000 meq | EXTENDED_RELEASE_TABLET | Freq: Two times a day (BID) | ORAL | Status: AC
  Administered 2013-07-20 – 2013-07-21 (×4): 20 meq via ORAL
  Filled 2013-07-20 (×5): qty 1

## 2013-07-20 NOTE — H&P (Signed)
[facility] Attending Admission Note: Meredith Sabal MD Personal pager:  [phone] FPTS Service Pager:  [phone]  I  have seen and examined this patient, reviewed their chart. I have discussed this patient with the resident. I agree with the resident's findings, assessment and care plan.  Additionally:  Briefly, 80 yo F with PMH signficiant for prior admissions for UTI/sepsis, CKD Stage IV, HTN, remote cerebellar infarcts, C dif colitis who presents with 1 week history of abdominal pain, fever, and diarrhea.  Seen in clinic about 1 week prior to onset of diarrhea and treated with Antibiotics for UTI, which ended on 12/28.  Describes multiple daily loose stools.  No nausea or vomiting.  Able to tolerate PO food and liquids.  Describes diffuse, cramping pain relieved by bowel movements.  Able to eat this week until yesterday.  Brought to ED, CT scan revealed diffuse colitis.  Admitted to [facility] and started on Cipro/Flagyl.    Exam:   Gen:  Alert and oriented elderly female, lying in bed.  NAD Heart:  RRR Lungs:  Clear Abdomen:  Mildly tender throughout, no focal areas.  No guarding or rebound.  No masses noted.  Hyperactive bowel sounds.   Ext:  No edema Neuro:  No focal deficits.  Alert and oriented x 3.   Imp/plan: 1.  Diarrhea: - Unclear cuase, treating as infectious.  Currently on Cipro/Flagyl.  Of note, leukocytosis persists and slightly trending upwards.  Bicarb 17 today.   - Agree with IV hydration, though she is starting to do well on her own with this today.  Has diastolic dysfunction, watch for fluid overload.   - No evidence of acute abdomen on examination today.  No signs of inflammatory bowel, unlikely to first appear in 80 yo. Abdominal pain associated with bowel movements and not food intake, less likely mesenteric ischemia.  Lactic acid normal.   - C dif and stool cultures pending.  Obtain FOB  2.  UTI:   - No further symptoms. - Treatment was possible trigger for this acute episode  diarrhea.  - Persistent hematuria noted.  Will need FU UA.  FOB pending.    3.  Anemia, normocytic: - Likely secondary to CKD Stage IV. - Baseline seems to be around 10.  4.  Ovarian cyst: - Not likely cause of acute hospitalization.  Sister with ovarian CA. - CA-125 pending.     5. CKD: - appears to be at baseline.     [name], MD 07/20/2013 2:29 PM

## 2013-07-20 NOTE — Progress Notes (Addendum)
ANTICOAGULATION CONSULT NOTE - Follow Up Consult  Pharmacy Consult for Lovenox/Cipro Indication: VTE prophylaxis, colitis  Allergies  Allergen Reactions  . Codeine Nausea Only  . Penicillins Rash    Pt does not remember how long ago or severity of reaction.     Patient Measurements: Height: 5\' 4"  (162.6 cm) Weight: 92 lb 3.2 oz (41.822 kg) IBW/kg (Calculated) : 54.7 Heparin Dosing Weight: 41.8 kg  Vital Signs: Temp: 99.5 F (37.5 C) (01/03 0639) Temp src: Oral (01/03 0639) BP: 142/60 mmHg (01/03 0639) Pulse Rate: 88 (01/03 0639)  Labs:  Recent Labs  07/19/13 1214 07/19/13 1708 07/20/13 0530  HGB 12.5 11.5* 10.9*  HCT 36.6 34.6* 33.1*  PLT 299 295 281  CREATININE 2.05* 1.99* 1.82*    Estimated Creatinine Clearance: 14.9 ml/min (by C-G formula based on Cr of 1.82).  Assessment: Admitted for abd pain and diarrhea.   Anticoagulation: Lovenox 30mg /d per protocol for weight <45kg and CrCl <30.   Goal of Therapy:  Anti-Xa level 0.6-1 units/ml 4hrs after LMWH dose given Monitor platelets by anticoagulation protocol: Yes   Plan:  Lovenox 30mg /d. No further dosage adjustment needed due to low weight and CrCl. Pharmacy will sign off.  Cipro 400mg  IV once daily also will not require further adjustment.   Karmel Patricelli S. Alford Highland, PharmD, BCPS Clinical Staff Pharmacist Pager [phone]  Eilene Ghazi Stillinger 07/20/2013,8:42 AM

## 2013-07-20 NOTE — Progress Notes (Signed)
Patient ID: Meredith Bender, female   DOB: 11-19-1925, 80 y.o.   MRN: [id number] Family Medicine Teaching Service Daily Progress Note Intern Pager: [phone]  Patient name: Meredith Bender Medical record number: [id number] Date of birth: 1925-11-11 Age: 80 y.o. Gender: female  Primary Care Provider: [name], MD Consultants: None Code Status: Partial Code, see orders and MOST form for specifics  Pt Overview and Major Events to Date:  1/1: admit to med surg GI pathogens pending. Started Cipro/Flagyl. CT mod- severe colitits   Chief Complaint: abdominal pain and diarrhea   Assessment and Plan:  Meredith Bender is a 80 y.o. female presenting with abdominal pain and diarrhea. PMH is significant for history of UTI, CKD stage 4, dyslipidemia, HTN.   Acute Colitis: C. diff vs. Other infectious (bacterial, viral). Diarrhea with abdominal pain x7 days. U/A from I&O cath today without signs of infection. CT scan showing severe diffuse colitis. Neutrophilic leukocytosis (44.0, 81% PMNs) and fever to 101.16F in ED. She has a history of C. diff documented in 2012 and of diarrhea with antibiotic administration in August 2014. WBC remains High today, despite start of abx yesterday. CO2 is decreasing mildly today, likely due to dehydration/diarrhea.  - GI pathogen panel by PCR in process and is a send out lab. Given it is a holiday weekend, unknown when the results will be available. Ordered lactic acid and C.diff PCR now.  - Contact isolation  - IV hydration with D5 given poor po intake; could likely switch over to NS today. Given 500 Ml bolus this morning. Pt able to tolerate fluids. Increasing diet to [place].  - Zofran 4mg  IV prn nausea  - Tylenol po/pr prn fever  - Monitor CBC/CMP.  - Starting antibiotics in light of history of C. Diff and recent abx, as Bender as age, fever, duration and severity. Ciprofloxacin IV per pharmacy and flagyl 500mg  q8h IV; if C.Diff positive will stop cipro and treat with flagyl  monotherapy. May transition to flagyl PO once tolerating PO.  - Consider GI consult if not improving  - 0.5- 1 mg Morphine for pain Q2h prn  CKD stage 4: No evidence of AKI from dehydration, as Cr 2.05 is at baseline. Followed by nephrology, Dr. Posey Pronto.  - Monitor BMP, continue IVF   Dyslipidemia:  - Restart lipitor 10mg  when tolerating po   HTN: Isolated systolic hypertension in ED  - Hydralazine IV prn SBP >160 or DBP >100  - Restart verapamil and hydralazine home meds when tolerating po   Ovarian Mass/cyst: Incidental find on Abd CT. Pt also with small bilateral pleural effusions and small volume ascites. Patient reports her sister had ovarian cancer.  - Mass/cyst could be ovarian, but not in [place] location for ovary, unless mobilized from prior surgery (pt is s/p Hysterectomy). Malignancy can not be ruled out.  - CA125 to rule out ovarian pathology today  FEN/GI: [place] diet, 500 cc NS bolus, then 143ml/hr Prophylaxis: Lovenox per pharmacy   Disposition: Pending clinical improvement, PT/OT recs  Subjective: Patient up to bedside commode. Still having diarrhea. She reports she is feeling much better this morning, but very weak. ABdominal pain better but not quite controlled with 0.5 mg morphine q2. Tolerating clear liquids. Thirsty.   Objective:  BP 142/60  Pulse 88  Temp(Src) 99.5 F (37.5 C) (Oral)  Resp 20  Ht 5\' 4"  (1.626 m)  Wt 92 lb 3.2 oz (41.822 kg)  BMI 15.82 kg/m2  SpO2 93%  Exam:  General:  Ill-appearing elderly female in NAD  HEENT: MMM, no conjunctival pallor  Cardiovascular: RRR, II/VI systolic murmur, 2+ pulses  Respiratory: Nonlabored, no wheezes, mild crackles bilateral bases  Abdomen: +BS, Soft, diffuse tenderness, non-distended. No masses appreciated.  Extremities: WWP, no edema  Skin: No rashes, masses or wounds  Neuro: Fatigued but arousable, responds appropriately, speech normal   Labs and Imaging:  Recent Labs Lab 07/19/13 1214 07/19/13 1708  07/20/13 0530  HGB 12.5 11.5* 10.9*  HCT 36.6 34.6* 33.1*  WBC 16.8* 17.9* 18.9*  PLT 299 295 281    Recent Labs Lab 07/19/13 1214 07/19/13 1708 07/20/13 0530  NA 138  --  137  K 4.2  --  3.6*  CL 103  --  107  CO2 19  --  17*  GLUCOSE 101*  --  112*  BUN 39*  --  30*  CREATININE 2.05* 1.99* 1.82*  CALCIUM 8.7  --  8.0*    Ct Abdomen Pelvis Wo Contrast  07/19/2013 CLINICAL DATA: Weakness, diarrhea for 5 days, chills EXAM: CT ABDOMEN AND PELVIS WITHOUT CONTRAST TECHNIQUE: Multidetector CT imaging of the abdomen and pelvis was performed following the standard protocol without intravenous contrast. COMPARISON: 07/23/2010 FINDINGS: Limited evaluation of the lung bases due to motion artifact. Mild discoid atelectasis right lung base. Small bilateral pleural effusions. Moderate hiatal hernia. Mild cardiac enlargement. Aorta is calcified diffusely. Liver and gallbladder are normal. Spleen is normal. Pancreas is normal. Mild bilateral adrenal nodularity is stable. Nonobstructing 1 cm stone midpole left kidney. Right kidney shows lobulated contour, stable from prior study. Bladder is normal. Reproductive organs not identified. Nonobstructive bowel gas pattern. Small volume of ascites. Cecum, ascending colon, transverse colon, and proximal descending colon shows severe wall thickening. There is mild to moderate wall thickening involving the colon beyond this level. There is a 2.9 cm cystic lesion in the left upper pelvis image number 56. This could represent a cyst in an ovary that has been mobilized due to prior hysterectomy. The air are no acute osseous abnormalities. IMPRESSION: 1. Small bilateral pleural effusions 2. Moderate hiatal hernia 3. Severe diffuse colitis 4. Probable 3 cm ovarian cyst on the left. This represents a change from prior study. Finding is nonspecific; ovarian malignancy not excluded. 5. Numerous other nonacute findings.    Ma Hillock, DO 07/20/2013, 6:47 AM PGY-2, [place] Intern pager: [phone], text pages welcome

## 2013-07-20 NOTE — Evaluation (Signed)
Physical Therapy Evaluation Patient Details Name: Meredith Bender MRN: [id number] DOB: April 30, 1926 Today's Date: 07/20/2013 Time: 4166-0630 PT Time Calculation (min): 16 min  PT Assessment / Plan / Recommendation History of Present Illness  Meredith Bender is a 80 y.o. female presenting with abdominal pain and diarrhea. PMH is significant for history of UTI, CKD stage 4, dyslipidemia, HTN.  Clinical Impression  Patient presents with problems listed below.  Will benefit from acute PT to maximize independence prior to discharge.  Do not anticipate any f/u PT or equipment needs at discharge.    PT Assessment  Patient needs continued PT services    Follow Up Recommendations  No PT follow up;Supervision/Assistance - 24 hour    Does the patient have the potential to tolerate intense rehabilitation      Barriers to Discharge Decreased caregiver support Patient lives alone.  Reports she can stay with her daughter at discharge for a while.    Equipment Recommendations  None recommended by PT    Recommendations for Other Services     Frequency Min 3X/week    Precautions / Restrictions Precautions Precautions: Fall Restrictions Weight Bearing Restrictions: No   Pertinent Vitals/Pain       Mobility  Bed Mobility Bed Mobility: Supine to Sit;Sitting - Scoot to Edge of Bed Supine to Sit: 5: Supervision;With rails;HOB elevated Sitting - Scoot to Edge of Bed: 5: Supervision Details for Bed Mobility Assistance: Verbal cues for technique.  Assist for safety only. Transfers Transfers: Sit to Stand;Stand to Sit Sit to Stand: 4: Min guard;With upper extremity assist;From bed Stand to Sit: 4: Min guard;With upper extremity assist;To bed;With armrests;To chair/3-in-1 Details for Transfer Assistance: Verbal cues for hand placement.  Assist for balance during transition.  Cues to stand for several seconds prior to ambulation. Ambulation/Gait Ambulation/Gait Assistance: 4: Min guard Ambulation  Distance (Feet): 76 Feet Assistive device: None Ambulation/Gait Assistance Details: Gait slightly unsteady initially.  Balance improved with increased distance.  Assist for balance and safety.  Gait slow and guarded. Gait Pattern: Step-through pattern;Decreased stride length;Decreased trunk rotation Gait velocity: Slow    Exercises     PT Diagnosis: Abnormality of gait;Generalized weakness;Acute pain  PT Problem List: Decreased strength;Decreased activity tolerance;Decreased balance;Decreased mobility;Pain PT Treatment Interventions: Gait training;Functional mobility training;Therapeutic exercise;Balance training;Patient/family education     PT Goals(Current goals can be found in the care plan section) Acute Rehab PT Goals Patient Stated Goal: To return to her home safely PT Goal Formulation: With patient Time For Goal Achievement: 07/27/13 Potential to Achieve Goals: Good  Visit Information  Last PT Received On: 07/20/13 Assistance Needed: +1 History of Present Illness: Meredith Bender is a 80 y.o. female presenting with abdominal pain and diarrhea. PMH is significant for history of UTI, CKD stage 4, dyslipidemia, HTN.       Prior [place] expects to be discharged to:: Private residence Living Arrangements: Alone Available Help at Discharge: Family;Available 24 hours/day (May stay with daughter and son-in-law) Type of Home: House Home Access: Level entry Home Layout: One level Home Equipment: None Prior Function Level of Independence: Independent Comments: Drives Communication Communication: No difficulties    Cognition  Cognition Arousal/Alertness: Awake/alert Behavior During Therapy: WFL for tasks assessed/performed Overall Cognitive Status: Within Functional Limits for tasks assessed    Extremity/Trunk Assessment Upper Extremity Assessment Upper Extremity Assessment: Overall WFL for tasks assessed Lower Extremity Assessment Lower  Extremity Assessment: Generalized weakness Cervical / Trunk Assessment Cervical / Trunk Assessment: Normal  Balance Balance Balance Assessed: Yes Static Standing Balance Static Standing - Balance Support: No upper extremity supported Static Standing - Level of Assistance: 5: Stand by assistance Static Standing - Comment/# of Minutes: 2  End of Session PT - End of Session Equipment Utilized During Treatment: Gait belt Activity Tolerance: Patient limited by fatigue Patient left: in chair;with call bell/phone within reach Nurse Communication: Mobility status  GP     Despina Pole 07/20/2013, 11:53 AM Carita Pian. Sanjuana Kava, [place] Pager [phone]

## 2013-07-21 DIAGNOSIS — A0472 Enterocolitis due to Clostridium difficile, not specified as recurrent: Principal | ICD-10-CM

## 2013-07-21 DIAGNOSIS — R971 Elevated cancer antigen 125 [CA 125]: Secondary | ICD-10-CM

## 2013-07-21 DIAGNOSIS — E785 Hyperlipidemia, unspecified: Secondary | ICD-10-CM | POA: Diagnosis not present

## 2013-07-21 DIAGNOSIS — R197 Diarrhea, unspecified: Secondary | ICD-10-CM | POA: Diagnosis not present

## 2013-07-21 DIAGNOSIS — K5289 Other specified noninfective gastroenteritis and colitis: Secondary | ICD-10-CM | POA: Diagnosis not present

## 2013-07-21 DIAGNOSIS — N184 Chronic kidney disease, stage 4 (severe): Secondary | ICD-10-CM | POA: Diagnosis not present

## 2013-07-21 LAB — CBC
HCT: 31.5 % — ABNORMAL LOW (ref 36.0–46.0)
Hemoglobin: 10.5 g/dL — ABNORMAL LOW (ref 12.0–15.0)
MCH: 28.9 pg (ref 26.0–34.0)
MCHC: 33.3 g/dL (ref 30.0–36.0)
MCV: 86.8 fL (ref 78.0–100.0)
PLATELETS: 284 10*3/uL (ref 150–400)
RBC: 3.63 MIL/uL — ABNORMAL LOW (ref 3.87–5.11)
RDW: 13.9 % (ref 11.5–15.5)
WBC: 16.1 10*3/uL — ABNORMAL HIGH (ref 4.0–10.5)

## 2013-07-21 LAB — BASIC METABOLIC PANEL
BUN: 24 mg/dL — ABNORMAL HIGH (ref 6–23)
CALCIUM: 7.7 mg/dL — AB (ref 8.4–10.5)
CO2: 16 mEq/L — ABNORMAL LOW (ref 19–32)
Chloride: 115 mEq/L — ABNORMAL HIGH (ref 96–112)
Creatinine, Ser: 1.93 mg/dL — ABNORMAL HIGH (ref 0.50–1.10)
GFR calc Af Amer: 26 mL/min — ABNORMAL LOW (ref >90)
GFR calc non Af Amer: 23 mL/min — ABNORMAL LOW (ref >90)
GLUCOSE: 98 mg/dL (ref 70–99)
Potassium: 4.2 mEq/L (ref 3.7–5.3)
SODIUM: 142 meq/L (ref 137–147)

## 2013-07-21 LAB — CA 125: CA 125: 77.8 U/mL — AB (ref 0.0–30.2)

## 2013-07-21 MED ORDER — VANCOMYCIN 50 MG/ML ORAL SOLUTION
125.0000 mg | Freq: Four times a day (QID) | ORAL | Status: DC
  Administered 2013-07-21 – 2013-07-23 (×9): 125 mg via ORAL
  Filled 2013-07-21 (×12): qty 2.5

## 2013-07-21 MED ORDER — SODIUM CHLORIDE 0.45 % IV SOLN
INTRAVENOUS | Status: DC
  Administered 2013-07-21 – 2013-07-25 (×6): via INTRAVENOUS

## 2013-07-21 NOTE — Progress Notes (Signed)
Patient ID: SOLANGEL MCMANAWAY, female   DOB: 1925/12/17, 80 y.o.   MRN: [id number] Family Medicine Teaching Service Daily Progress Note Intern Pager: [phone]  Patient name: Toniqua Melamed Caine Medical record number: [id number] Date of birth: [birth year] Age: 80 y.o. Gender: female  Primary Care Provider: [name], MD Consultants: None Code Status: Partial Code, see orders and MOST form for specifics  Pt Overview and Major Events to Date:  1/1: admit to med surg GI pathogens pending. Started Cipro/Flagyl. CT mod- severe colitits 1/3: C. diff positive, clinda d/c'd. IV flagyl, start oral vanc  Chief Complaint: abdominal pain and diarrhea   Assessment and Plan:  Talia L Berkery is a 80 y.o. female presenting with abdominal pain and diarrhea. PMH is significant for history of UTI, CKD stage 4, dyslipidemia, HTN.   # Acute Colitis: C. diff vs. Other infectious (bacterial, viral). Diarrhea with abdominal pain x7 days. U/A from I&O cath today without signs of infection. CT scan showing severe diffuse colitis. Neutrophilic leukocytosis (98.9, 81% PMNs) and fever to 101.88F in ED. She has a history of C. diff documented in 2012 and of diarrhea with antibiotic administration in August 2014.  - GI pathogen panel by PCR in process and is a send out lab. Given it is a holiday weekend, unknown when the results will be available.  - C. diff PCR positive, clindamycin d/c'd. Continue IV Flagyl (on day 3). Once tolerating PO transition to oral flagyl. If clinically worsens, considering adding oral vanc. - WBC decreased 18.9-->16.1 - Lactate 1.0 - Contact isolation  - IV hydration with D5 given poor po intake; could likely switch over to NS today. Given 500 Ml bolus this morning. Pt able to tolerate fluids. Increasing diet to [facility].  - Zofran 4mg  IV prn nausea  - Tylenol po/pr prn fever  - Monitor CBC/CMP.  - Consider GI consult if not improving  - 0.5-1 mg Morphine for pain Q2h prn (received 3.5mg  yesterday,    CKD stage 4: No evidence of AKI from dehydration, as Cr 2.05 is at baseline. Followed by nephrology, Dr. Posey Pronto.  - Monitor BMP, continue IVF   Hyperchloremic metabolic acidosis: likely from continued NS IVF - switch to 1/2NS IVF.  Dyslipidemia:  - Restart lipitor 10mg  when tolerating po   HTN: Isolated systolic hypertension in ED  - Hydralazine IV prn SBP >160 or DBP >100  - Restart verapamil and hydralazine home meds when tolerating po   Ovarian Mass/cyst: Incidental find on Abd CT. Pt also with small bilateral pleural effusions and small volume ascites. Patient reports her sister had ovarian cancer - Mass/cyst could be ovarian, but not in [place] location for ovary, unless mobilized from prior surgery (pt is s/p Hysterectomy). Malignancy can not be ruled out.  - CA125 level 77.8  - will contact oncology for recs (inpatient vs outpatient workup)  FEN/GI: [place] diet, 500 cc NS bolus, then 1105ml/hr Prophylaxis: Lovenox per pharmacy   Disposition: Pending clinical improvement, PT/OT recs  Subjective:  No acute events overnight. Pt reports feeling better. She is still having watery diarrhea, though with less urgency. She cannot recall how many episode she had overnight or this morning. Stomach is "sore". No CP, no SOB. She attempted to eat some toast this morning, had a few bites before exam interrupted her.  Objective:  BP 156/63  Pulse 90  Temp(Src) 98.3 F (36.8 C) (Oral)  Resp 20  Ht 5\' 4"  (1.626 m)  Wt 92 lb 3.2 oz (41.822 kg)  BMI 15.82 kg/m2  SpO2 95%  Exam:  General: elderly female in NAD sitting on side of bed eating breakfast, appears to be comfortable currently HEENT: MMM, no conjunctival pallor  Cardiovascular: RRR, 2/6 systolic murmur, 2+ pulses DP, radial Respiratory: CTAB, normal effort  Abdomen: +BS, Soft, diffuse tenderness, non-distended. No masses appreciated. Tympanic to percussion epigastrium, rest of abdomen dull. Extremities: no edema or  cyanosis Skin: warm and dry. Legs with varicosities  Neuro: alert and oriented    Labs and Imaging:  Recent Labs Lab 07/19/13 1214 07/19/13 1708 07/20/13 0530  HGB 12.5 11.5* 10.9*  HCT 36.6 34.6* 33.1*  WBC 16.8* 17.9* 18.9*  PLT 299 295 281    Recent Labs Lab 07/19/13 1214 07/19/13 1708 07/20/13 0530  NA 138  --  137  K 4.2  --  3.6*  CL 103  --  107  CO2 19  --  17*  GLUCOSE 101*  --  112*  BUN 39*  --  30*  CREATININE 2.05* 1.99* 1.82*  CALCIUM 8.7  --  8.0*    Ct Abdomen Pelvis Wo Contrast  07/19/2013 CLINICAL DATA: Weakness, diarrhea for 5 days, chills EXAM: CT ABDOMEN AND PELVIS WITHOUT CONTRAST TECHNIQUE: Multidetector CT imaging of the abdomen and pelvis was performed following the standard protocol without intravenous contrast. COMPARISON: 07/23/2010 FINDINGS: Limited evaluation of the lung bases due to motion artifact. Mild discoid atelectasis right lung base. Small bilateral pleural effusions. Moderate hiatal hernia. Mild cardiac enlargement. Aorta is calcified diffusely. Liver and gallbladder are normal. Spleen is normal. Pancreas is normal. Mild bilateral adrenal nodularity is stable. Nonobstructing 1 cm stone midpole left kidney. Right kidney shows lobulated contour, stable from prior study. Bladder is normal. Reproductive organs not identified. Nonobstructive bowel gas pattern. Small volume of ascites. Cecum, ascending colon, transverse colon, and proximal descending colon shows severe wall thickening. There is mild to moderate wall thickening involving the colon beyond this level. There is a 2.9 cm cystic lesion in the left upper pelvis image number 56. This could represent a cyst in an ovary that has been mobilized due to prior hysterectomy. The air are no acute osseous abnormalities. IMPRESSION: 1. Small bilateral pleural effusions 2. Moderate hiatal hernia 3. Severe diffuse colitis 4. Probable 3 cm ovarian cyst on the left. This represents a change from prior study.  Finding is nonspecific; ovarian malignancy not excluded. 5. Numerous other nonacute findings.    Tawanna Sat, MD 07/21/2013, 7:09 AM PGY-1, [place] Intern pager: [phone], text pages welcome

## 2013-07-21 NOTE — Progress Notes (Signed)
FMTS Attending Daily Note:  Jeff Jeremyah Jelley MD  [phone] pager  Family Practice pager:  [phone] I have seen and examined this patient and have reviewed their chart. I have discussed this patient with the resident. I agree with the resident's findings, assessment and care plan.  Additionally:  See separate admit note  Tearah Saulsbury H Heavenlee Maiorana, MD     

## 2013-07-21 NOTE — Progress Notes (Addendum)
FMTS Attending Daily Note:  Meredith Sabal MD  [phone] pager  Family Practice pager:  [phone] I have seen and examined this patient and have reviewed their chart. I have discussed this patient with the resident. I agree with the resident's findings, assessment and care plan.  Additionally:  - Little improvement from yesterday.  C. Dif positive.  Abdomen still tender and with some mild distension about the same as yesterday.  No focal findings or suggestions of acute abdomen.   - I would classify her as severe c. Dif (leukocytosis >15K, bicarb 16, diffuse colitis on CT, abdominal distension, age, CKD Stage IV) and treat as such.  Starting Vanc PO today alongside Flagyl.   - Also with elevated CA-125 with new ovarian mass.  Defer to oncology, but would lean towards less aggressive treatment in this 80 year old female.   [name], MD 07/21/2013 12:43 PM

## 2013-07-22 DIAGNOSIS — R971 Elevated cancer antigen 125 [CA 125]: Secondary | ICD-10-CM | POA: Diagnosis not present

## 2013-07-22 DIAGNOSIS — R197 Diarrhea, unspecified: Secondary | ICD-10-CM | POA: Diagnosis not present

## 2013-07-22 DIAGNOSIS — K5289 Other specified noninfective gastroenteritis and colitis: Secondary | ICD-10-CM | POA: Diagnosis not present

## 2013-07-22 LAB — GI PATHOGEN PANEL BY PCR, STOOL
C DIFFICILE TOXIN A/B: POSITIVE
Campylobacter by PCR: NEGATIVE
Cryptosporidium by PCR: NEGATIVE
E COLI (STEC): NEGATIVE
E coli (ETEC) LT/ST: NEGATIVE
E coli 0157 by PCR: NEGATIVE
G lamblia by PCR: NEGATIVE
Norovirus GI/GII: NEGATIVE
ROTAVIRUS A BY PCR: NEGATIVE
SALMONELLA BY PCR: NEGATIVE
SHIGELLA BY PCR: NEGATIVE

## 2013-07-22 MED ORDER — LORATADINE 10 MG PO TABS
10.0000 mg | ORAL_TABLET | Freq: Every day | ORAL | Status: DC
Start: 2013-07-23 — End: 2013-07-26
  Administered 2013-07-23 – 2013-07-26 (×4): 10 mg via ORAL
  Filled 2013-07-22 (×4): qty 1

## 2013-07-22 NOTE — Progress Notes (Signed)
Patient continues to have episodes of loose stool, complaining of right lower abdominal pain. Medicated with Morphine 1 mg at 1130. Up to chair later for lunch. Will monitor diet, comfort. Tonita Cong, RN

## 2013-07-22 NOTE — Progress Notes (Signed)
Patient ID: Meredith Bender, female   DOB: Dec 01, 1925, 80 y.o.   MRN: [id number] Family Medicine Teaching Service Daily Progress Note Intern Pager: [phone]  Patient name: Meredith Bender Medical record number: [id number] Date of birth: April 12, 1926 Age: 80 y.o. Gender: female  Primary Care Provider: [name], MD Consultants: None Code Status: Partial Code, see orders and MOST form for specifics  Pt Overview and Major Events to Date:  1/1: admit to med surg GI pathogens pending. Started Cipro/Flagyl. CT mod- severe colitits 1/3: C. diff positive, clinda d/c'd. IV flagyl, start oral vanc  Chief Complaint: abdominal pain and diarrhea   Assessment and Plan:  Meredith Bender is a 80 y.o. female presenting with abdominal pain and diarrhea. PMH is significant for history of UTI, CKD stage 4, dyslipidemia, HTN.   # C. diff colitis: CT scan showing severe diffuse colitis. Neutrophilic leukocytosis (13.0, 81% PMNs) and fever to 101.31F in ED. She has a history of C. diff documented in 2012 and of diarrhea with antibiotic administration in August 2014.  - C. diff PCR positive - Flagyl IV (01/02- ), vancomycin PO (01/04- ) - WBC decreased 18.9-->16.1 - Contact isolation  - IV hydration with 1/2NS - Zofran 4mg  IV prn nausea  - Tylenol po/pr prn fever  - Monitor CBC/BMP.  - Consider GI consult or addition of difficid if not improving  - Morphine 05.-1.0mg  q3h prn pain  CKD stage 4: No evidence of AKI from dehydration, as Cr 2.0 is baseline. Followed by nephrology, Dr. Posey Pronto.  - Monitor BMP, continue IVF   Hyperchloremic metabolic acidosis: likely from continued NS IVF - switch to 1/2NS IVF, will continue to monitor.  Dyslipidemia:  - Restart lipitor 10mg  when tolerating po   HTN: Isolated systolic hypertension in ED  - Hydralazine IV prn SBP >160 or DBP >100  - Restart verapamil and hydralazine home meds when tolerating po   Ovarian Mass/cyst: Incidental find on Abd CT. Pt also with small  bilateral pleural effusions and small volume ascites. Patient reports her sister had ovarian cancer - Mass/cyst could be ovarian, but not in [place] location for ovary, unless mobilized from prior surgery (pt is s/p Hysterectomy). Malignancy can not be ruled out.  - CA125 level 77.8  - Outpatient workup at [place]  FEN/GI: [place], 1/2NS @ 132ml/hr Prophylaxis: Lovenox per pharmacy   Disposition: Pending clinical improvement, PT/OT recs  Subjective:  Pt reports continued incontinence with interruption of sleep by BMs. 10 BMs in past 24hrs. Unchanged abdominal pain. No CP, no SOB. Tolerating water and breakfast, but no appetite.  Objective:  BP 177/73  Pulse 84  Temp(Src) 98.4 F (36.9 C) (Oral)  Resp 20  Ht 5\' 4"  (1.626 m)  Wt 92 lb 3.2 oz (41.822 kg)  BMI 15.82 kg/m2  SpO2 95%  Exam:  General: elderly female in NAD sitting in bed eating breakfast, appears to be comfortable HEENT: MMM, no conjunctival pallor  Cardiovascular: RRR, 2/6 systolic murmur Respiratory: CTAB, normal effort  Abdomen: +BS, Soft, diffuse tenderness R>L, non-distended. No masses appreciated.  Extremities: WWP, no edema or cyanosis Skin: warm and dry. Legs with varicosities  Neuro: alert and oriented, speech normal    Labs and Imaging:  Recent Labs Lab 07/19/13 1708 07/20/13 0530 07/21/13 0655  HGB 11.5* 10.9* 10.5*  HCT 34.6* 33.1* 31.5*  WBC 17.9* 18.9* 16.1*  PLT 295 281 284    Recent Labs Lab 07/19/13 1214 07/19/13 1708 07/20/13 0530 07/21/13 0655  NA  138  --  137 142  K 4.2  --  3.6* 4.2  CL 103  --  107 115*  CO2 19  --  17* 16*  GLUCOSE 101*  --  112* 98  BUN 39*  --  30* 24*  CREATININE 2.05* 1.99* 1.82* 1.93*  CALCIUM 8.7  --  8.0* 7.7*    Ct Abdomen Pelvis Wo Contrast  07/19/2013 CLINICAL DATA: Weakness, diarrhea for 5 days, chills EXAM: CT ABDOMEN AND PELVIS WITHOUT CONTRAST TECHNIQUE: Multidetector CT imaging of the abdomen and pelvis was performed following the standard  protocol without intravenous contrast. COMPARISON: 07/23/2010 FINDINGS: Limited evaluation of the lung bases due to motion artifact. Mild discoid atelectasis right lung base. Small bilateral pleural effusions. Moderate hiatal hernia. Mild cardiac enlargement. Aorta is calcified diffusely. Liver and gallbladder are normal. Spleen is normal. Pancreas is normal. Mild bilateral adrenal nodularity is stable. Nonobstructing 1 cm stone midpole left kidney. Right kidney shows lobulated contour, stable from prior study. Bladder is normal. Reproductive organs not identified. Nonobstructive bowel gas pattern. Small volume of ascites. Cecum, ascending colon, transverse colon, and proximal descending colon shows severe wall thickening. There is mild to moderate wall thickening involving the colon beyond this level. There is a 2.9 cm cystic lesion in the left upper pelvis image number 56. This could represent a cyst in an ovary that has been mobilized due to prior hysterectomy. The air are no acute osseous abnormalities. IMPRESSION: 1. Small bilateral pleural effusions 2. Moderate hiatal hernia 3. Severe diffuse colitis 4. Probable 3 cm ovarian cyst on the left. This represents a change from prior study. Finding is nonspecific; ovarian malignancy not excluded. 5. Numerous other nonacute findings.   Vance Gather, MD 07/22/2013, 7:28 AM PGY-1, [place] Intern pager: [phone], text pages welcome

## 2013-07-22 NOTE — Progress Notes (Signed)
FMTS Attending  Note: Meredith Zinger,MD I  have seen and examined this patient, reviewed their chart. I have discussed this patient with the resident. I agree with the resident's findings, assessment and care plan.  

## 2013-07-22 NOTE — Evaluation (Signed)
Occupational Therapy Evaluation Patient Details Name: Meredith Bender MRN: [id number] DOB: [birth year] Today's Date: 07/22/2013 Time: 1143-1209 OT Time Calculation (min): 26 min  OT Assessment / Plan / Recommendation History of present illness Meredith Bender is a 80 y.o. female presenting with abdominal pain and diarrhea. PMH is significant for history of UTI, CKD stage 4, dyslipidemia, HTN.   Clinical Impression   Pt presents with below problem list. Pt could benefit from acute OT to increase independence prior to d/c.    OT Assessment  Patient needs continued OT Services    Follow Up Recommendations  No OT follow up;Supervision/Assistance - 24 hour    Barriers to Discharge      Equipment Recommendations  3 in 1 bedside comode    Recommendations for Other Services    Frequency  Min 2X/week    Precautions / Restrictions Precautions Precautions: Fall Restrictions Weight Bearing Restrictions: No   Pertinent Vitals/Pain Pain in abdomen and buttocks area. Nurse notified.     ADL  Grooming: Min guard Where Assessed - Grooming: Supported standing;Unsupported standing Lower Body Dressing: Minimal assistance Where Assessed - Lower Body Dressing: Supported sit to stand Toilet Transfer: Magazine features editor Method: Sit to Loss adjuster, chartered: Other (comment) (from bed) Tub/Shower Transfer Method: Not assessed Equipment Used: Gait belt Transfers/Ambulation Related to ADLs: Pt used IV pole and reached for furniture during ambulation. Min guard for sit <> stand. ADL Comments: Recommended sitting on chair for bathing and sitting to get dressed. Educated on energy conservation techniques. Assistance to open toothpaste box and take off top.    OT Diagnosis: Acute pain;Generalized weakness  OT Problem List: Decreased strength;Decreased activity tolerance;Impaired balance (sitting and/or standing);Decreased knowledge of use of DME or AE;Decreased knowledge of  precautions;Pain OT Treatment Interventions: Self-care/ADL training;DME and/or AE instruction;Therapeutic activities;Patient/family education;Balance training;Therapeutic exercise;Energy conservation   OT Goals(Current goals can be found in the care plan section) Acute Rehab OT Goals Patient Stated Goal: be able to get up and down OT Goal Formulation: With patient Time For Goal Achievement: 07/29/13 Potential to Achieve Goals: Good ADL Goals Pt Will Perform Lower Body Bathing: with modified independence;sit to/from stand Pt Will Perform Lower Body Dressing: with modified independence;sit to/from stand Pt Will Transfer to Toilet: with modified independence;ambulating Pt Will Perform Toileting - Clothing Manipulation and hygiene: with modified independence;sit to/from stand Pt Will Perform Tub/Shower Transfer: Tub transfer;with supervision;ambulating;shower seat Additional ADL Goal #1: Pt will independently verbalize and demonstrate 3/3 energy conservation techniques.  Visit Information  Last OT Received On: 07/22/13 Assistance Needed: +1 History of Present Illness: Meredith Bender is a 80 y.o. female presenting with abdominal pain and diarrhea. PMH is significant for history of UTI, CKD stage 4, dyslipidemia, HTN.       Prior [place] expects to be discharged to:: Private residence Living Arrangements: Alone Available Help at Discharge: Family;Available 24 hours/day (may stay with daughter and son-in-law) Type of Home: House Home Access: Level entry Home Layout: One level Home Equipment: Shower seat Prior Function Level of Independence: Needs assistance ADL's / Homemaking Assistance Needed: caregiver comes in 3 days a week/assists with cooking and cleaning Comments: Drives Communication Communication: No difficulties         Vision/Perception Vision - History Baseline Vision:  (glasses for reading and driving)   Cognition   Cognition Arousal/Alertness: Awake/alert Behavior During Therapy: WFL for tasks assessed/performed Overall Cognitive Status: Within Functional Limits for tasks assessed  Extremity/Trunk Assessment Upper Extremity Assessment Upper Extremity Assessment: Generalized weakness Lower Extremity Assessment Lower Extremity Assessment: Defer to PT evaluation     Mobility Bed Mobility Bed Mobility: Right Sidelying to Sit;Sitting - Scoot to Edge of Bed Right Sidelying to Sit: 4: Min guard Sitting - Scoot to [employer] of Bed: 4: Min guard Details for Bed Mobility Assistance: Min guard for safety. Transfers Transfers: Sit to Stand;Stand to Sit Sit to Stand: 4: Min guard;With upper extremity assist;From bed Stand to Sit: 4: Min guard;To chair/3-in-1 Details for Transfer Assistance: cues to reinforce technique.     Exercise     Balance     End of Session OT - End of Session Equipment Utilized During Treatment: Gait belt Activity Tolerance: Patient tolerated treatment well Patient left: in chair;with call bell/phone within reach Nurse Communication: Other (comment);Mobility status (pain and coughing/runny nose)  GO     Benito Mccreedy OTR/L [phone] 07/22/2013, 12:49 PM

## 2013-07-22 NOTE — Progress Notes (Signed)
PT Cancellation Note  Patient Details Name: Meredith Bender MRN: [id number] DOB: Sep 27, 1925   Cancelled Treatment:    Reason Eval/Treat Not Completed: Medical issues which prohibited therapy (pt c/o feeling "washed out" after multiple BM's all night long)   Rashelle Ireland 07/22/2013, 10:34 AM

## 2013-07-23 DIAGNOSIS — A0472 Enterocolitis due to Clostridium difficile, not specified as recurrent: Secondary | ICD-10-CM | POA: Diagnosis not present

## 2013-07-23 DIAGNOSIS — R197 Diarrhea, unspecified: Secondary | ICD-10-CM | POA: Diagnosis not present

## 2013-07-23 DIAGNOSIS — K5289 Other specified noninfective gastroenteritis and colitis: Secondary | ICD-10-CM | POA: Diagnosis not present

## 2013-07-23 LAB — BASIC METABOLIC PANEL
BUN: 15 mg/dL (ref 6–23)
CO2: 15 mEq/L — ABNORMAL LOW (ref 19–32)
Calcium: 7.8 mg/dL — ABNORMAL LOW (ref 8.4–10.5)
Chloride: 111 mEq/L (ref 96–112)
Creatinine, Ser: 1.57 mg/dL — ABNORMAL HIGH (ref 0.50–1.10)
GFR calc Af Amer: 34 mL/min — ABNORMAL LOW (ref >90)
GFR, EST NON AFRICAN AMERICAN: 29 mL/min — AB (ref >90)
Glucose, Bld: 92 mg/dL (ref 70–99)
Potassium: 4.2 mEq/L (ref 3.7–5.3)
SODIUM: 139 meq/L (ref 137–147)

## 2013-07-23 LAB — CBC
HCT: 32 % — ABNORMAL LOW (ref 36.0–46.0)
Hemoglobin: 10.6 g/dL — ABNORMAL LOW (ref 12.0–15.0)
MCH: 28.6 pg (ref 26.0–34.0)
MCHC: 33.1 g/dL (ref 30.0–36.0)
MCV: 86.3 fL (ref 78.0–100.0)
Platelets: 276 10*3/uL (ref 150–400)
RBC: 3.71 MIL/uL — ABNORMAL LOW (ref 3.87–5.11)
RDW: 14 % (ref 11.5–15.5)
WBC: 13.2 10*3/uL — ABNORMAL HIGH (ref 4.0–10.5)

## 2013-07-23 MED ORDER — ADULT MULTIVITAMIN W/MINERALS CH
1.0000 | ORAL_TABLET | Freq: Every day | ORAL | Status: DC
  Administered 2013-07-23 – 2013-07-26 (×4): 1 via ORAL
  Filled 2013-07-23 (×4): qty 1

## 2013-07-23 MED ORDER — ENSURE COMPLETE PO LIQD
237.0000 mL | ORAL | Status: DC
Start: 2013-07-23 — End: 2013-07-25
  Administered 2013-07-23: 237 mL via ORAL

## 2013-07-23 MED ORDER — ENSURE PUDDING PO PUDG
1.0000 | ORAL | Status: DC
  Administered 2013-07-23 – 2013-07-24 (×2): 1 via ORAL

## 2013-07-23 MED ORDER — METRONIDAZOLE 500 MG PO TABS
500.0000 mg | ORAL_TABLET | Freq: Three times a day (TID) | ORAL | Status: DC
  Administered 2013-07-23 – 2013-07-26 (×10): 500 mg via ORAL
  Filled 2013-07-23 (×14): qty 1

## 2013-07-23 MED ORDER — BOOST / RESOURCE BREEZE PO LIQD
1.0000 | Freq: Two times a day (BID) | ORAL | Status: DC
  Administered 2013-07-23: 1 via ORAL

## 2013-07-23 MED ORDER — HYDRALAZINE HCL 20 MG/ML IJ SOLN
10.0000 mg | Freq: Once | INTRAMUSCULAR | Status: AC
  Administered 2013-07-23: 10 mg via INTRAVENOUS
  Filled 2013-07-23: qty 1

## 2013-07-23 MED ORDER — VANCOMYCIN 50 MG/ML ORAL SOLUTION
125.0000 mg | Freq: Four times a day (QID) | ORAL | Status: DC
  Administered 2013-07-23 – 2013-07-26 (×13): 125 mg via ORAL
  Filled 2013-07-23 (×16): qty 2.5

## 2013-07-23 NOTE — Progress Notes (Signed)
Patient ID: JANIECE SCOVILL, female   DOB: February 16, 1926, 80 y.o.   MRN: [id number] Family Medicine Teaching Service Daily Progress Note Intern Pager: [phone]  Patient name: Alexxus Sobh Evenson Medical record number: [id number] Date of birth: 10/22/1925 Age: 80 y.o. Gender: female  Primary Care Provider: [name], MD Consultants: None Code Status: Partial Code, see orders and MOST form for specifics  Pt Overview and Major Events to Date:  1/1: admit to med surg GI pathogens pending. Started Cipro/Flagyl. CT mod- severe colitits 1/3: C. diff positive, clinda d/c'd. IV flagyl, start oral vanc  Chief Complaint: abdominal pain and diarrhea   Assessment and Plan:  Veronique L Shuping is a 80 y.o. female presenting with abdominal pain and diarrhea. PMH is significant for history of UTI, CKD stage 4, dyslipidemia, HTN.   # C. diff colitis: CT scan showing severe diffuse colitis. Neutrophilic leukocytosis (59.5, 81% PMNs) and fever to 101.73F in ED. She has a history of C. diff documented in 2012 and of diarrhea with antibiotic administration in August 2014.  - C. diff PCR positive - Flagyl change to PO today (01/02- ), vancomycin PO (01/04- ) - WBC decreased 18.9 > 16.1 > 13.2 - Contact isolation  - IV hydration with 1/2NS - Zofran 4mg  IV prn nausea  - Tylenol po/pr prn fever  - Monitor CBC/BMP.  - Consider GI consult or addition of difficid if not improving  - Continue to monitor for signs of toxic megacolon.  - Morphine 05.-1.0mg  q3h prn pain  CKD stage 4: Cr 2.0 is baseline. Followed by nephrology, Dr. Posey Pronto.  - Monitor BMP, continue IVF  - Cr 1.57  Hyperchloremic metabolic acidosis: likely from continued NS IVF - Continue 1/2NS, seems to be improving with Cl down 115 > 111, though bicarb 15, will continue to monitor  Dyslipidemia:  - Restart lipitor 10mg  when tolerating po   HTN: Isolated systolic hypertension in ED  - Hydralazine IV prn SBP >160 or DBP >100  - Restart verapamil and  hydralazine home meds when tolerating po   Ovarian Mass/cyst: Incidental find on Abd CT. Pt also with small bilateral pleural effusions and small volume ascites. Patient reports her sister had ovarian cancer - Mass/cyst could be ovarian, but not in [place] location for ovary, unless mobilized from prior surgery (pt is s/p Hysterectomy). Malignancy can not be ruled out.  - CA125 level 77.8  - Outpatient workup at [facility] - Weight loss likely due to this.  FEN/GI: Criss Rosales diet, 1/2NS @ 139ml/hr Prophylaxis: Lovenox per pharmacy   Disposition: Pending clinical improvement, PT recs, no OT f/u recommended. But 24hr supervision.   Subjective:  Pt reports continued incontinence, maybe improving. Unchanged diffuse abdominal pain. No CP, no SOB, no emesis. Tolerating po  Objective:  BP 184/65  Pulse 78  Temp(Src) 98.3 F (36.8 C) (Oral)  Resp 20  Ht 5\' 4"  (1.626 m)  Wt 92 lb 3.2 oz (41.822 kg)  BMI 15.82 kg/m2  SpO2 95%  Exam:  General: elderly female in NAD sitting in bed  HEENT: MMM, no conjunctival pallor  Cardiovascular: RRR, 2/6 systolic murmur Respiratory: CTAB, normal effort  Abdomen: +BS, Soft, diffuse tenderness R>L, non-distended. No masses appreciated.  Extremities: WWP, no edema or cyanosis Skin: warm and dry. Legs with varicosities nontender Neuro: alert and oriented, speech normal    Labs and Imaging:  Recent Labs Lab 07/20/13 0530 07/21/13 0655 07/23/13 0556  HGB 10.9* 10.5* 10.6*  HCT 33.1* 31.5* 32.0*  WBC 18.9*  16.1* 13.2*  PLT 281 284 276    Recent Labs Lab 07/19/13 1214 07/19/13 1708 07/20/13 0530 07/21/13 0655 07/23/13 0556  NA 138  --  137 142 139  K 4.2  --  3.6* 4.2 4.2  CL 103  --  107 115* 111  CO2 19  --  17* 16* 15*  GLUCOSE 101*  --  112* 98 92  BUN 39*  --  30* 24* 15  CREATININE 2.05* 1.99* 1.82* 1.93* 1.57*  CALCIUM 8.7  --  8.0* 7.7* 7.8*    Ct Abdomen Pelvis Wo Contrast  07/19/2013 CLINICAL DATA: Weakness, diarrhea for 5 days,  chills EXAM: CT ABDOMEN AND PELVIS WITHOUT CONTRAST TECHNIQUE: Multidetector CT imaging of the abdomen and pelvis was performed following the standard protocol without intravenous contrast. COMPARISON: 07/23/2010 FINDINGS: Limited evaluation of the lung bases due to motion artifact. Mild discoid atelectasis right lung base. Small bilateral pleural effusions. Moderate hiatal hernia. Mild cardiac enlargement. Aorta is calcified diffusely. Liver and gallbladder are normal. Spleen is normal. Pancreas is normal. Mild bilateral adrenal nodularity is stable. Nonobstructing 1 cm stone midpole left kidney. Right kidney shows lobulated contour, stable from prior study. Bladder is normal. Reproductive organs not identified. Nonobstructive bowel gas pattern. Small volume of ascites. Cecum, ascending colon, transverse colon, and proximal descending colon shows severe wall thickening. There is mild to moderate wall thickening involving the colon beyond this level. There is a 2.9 cm cystic lesion in the left upper pelvis image number 56. This could represent a cyst in an ovary that has been mobilized due to prior hysterectomy. The air are no acute osseous abnormalities. IMPRESSION: 1. Small bilateral pleural effusions 2. Moderate hiatal hernia 3. Severe diffuse colitis 4. Probable 3 cm ovarian cyst on the left. This represents a change from prior study. Finding is nonspecific; ovarian malignancy not excluded. 5. Numerous other nonacute findings.   Vance Gather, MD 07/23/2013, 9:14 AM PGY-1, [facility] Intern pager: [phone], text pages welcome

## 2013-07-23 NOTE — Progress Notes (Signed)
INITIAL NUTRITION ASSESSMENT  DOCUMENTATION CODES Per approved criteria  -Underweight   INTERVENTION: Provide Resource Breeze BID with meals Provide Ensure Pudding once daily Provide Ensure Complete once daily Provide Multivitamin with minerals daily  NUTRITION DIAGNOSIS: Unintentional weight loss related to diarrhea and abdominal pain as evidenced by 16% weight loss in less than one month.   Goal: Pt to meet >/= 90% of their estimated nutrition needs   Monitor:  PO intake Weight Labs  Reason for Assessment: Low BMI  80 y.o. female  Admitting Dx: <principal problem not specified>  ASSESSMENT: 80 yo F with PMH signficiant for prior admissions for UTI/sepsis, CKD Stage IV, HTN, remote cerebellar infarcts, C dif colitis who presents with 1 week history of abdominal pain, fever, and diarrhea. Seen in clinic about 1 week prior to onset of diarrhea and treated with Antibiotics for UTI, which ended on 12/28. Describes multiple daily loose stools. No nausea or vomiting. Able to tolerate PO food and liquids. Able to eat this week until one day PTA.  Pt on bedside commode at time of visit. Pt not feeling well, moaning and complaining of abdominal pain. Pt confirms recent weight loss and states she is willing to try any nutritional supplements. Per nursing notes pt is eating 30-50% of meals; some meals not documented.  Weight history shows pt has lost 16% of her body weight in less than one month. Suspect severe malnutrition but, unable to perform nutrition focused physical exam at this time to confirm if pt meets criteria.   Height: Ht Readings from Last 1 Encounters:  07/19/13 5\' 4"  (1.626 m)    Weight: Wt Readings from Last 1 Encounters:  07/19/13 92 lb 3.2 oz (41.822 kg)    Ideal Body Weight: 120 lbs  % Ideal Body Weight: 77%  Wt Readings from Last 10 Encounters:  07/19/13 92 lb 3.2 oz (41.822 kg)  07/15/13 108 lb (48.988 kg)  07/04/13 110 lb (49.896 kg)  06/12/13 110 lb  8 oz (50.122 kg)  03/02/13 114 lb 14.4 oz (52.118 kg)    Usual Body Weight: 114 lbs  % Usual Body Weight: 81%  BMI:  Body mass index is 15.82 kg/(m^2).  Estimated Nutritional Needs: Kcal: 1300-1500 Protein: 40-50 grams Fluid: 1.2-1.4 L/day  Skin: WDL  Diet Order: Criss Rosales  EDUCATION NEEDS: -No education needs identified at this time   Intake/Output Summary (Last 24 hours) at 07/23/13 1045 Last data filed at 07/22/13 1200  Gross per 24 hour  Intake    240 ml  Output      0 ml  Net    240 ml    Last BM: 1/5   Labs:   Recent Labs Lab 07/20/13 0530 07/21/13 0655 07/23/13 0556  NA 137 142 139  K 3.6* 4.2 4.2  CL 107 115* 111  CO2 17* 16* 15*  BUN 30* 24* 15  CREATININE 1.82* 1.93* 1.57*  CALCIUM 8.0* 7.7* 7.8*  GLUCOSE 112* 98 92    CBG (last 3)  No results found for this basename: GLUCAP,  in the last 72 hours  Scheduled Meds: . enoxaparin (LOVENOX) injection  30 mg Subcutaneous Q24H  . loratadine  10 mg Oral Daily  . metronidazole  500 mg Intravenous Q8H  . vancomycin  125 mg Oral Q6H    Continuous Infusions: . sodium chloride 100 mL/hr at 07/22/13 9833    Past Medical History  Diagnosis Date  . Hypertension   . CVA (cerebral infarction)     Remote  bilateral cerebellar infarcts on CT 8/14  . Dyslipidemia 02/28/2013  . Anginal pain   . Stroke   . Chronic kidney disease   . Arthritis   . COPD (chronic obstructive pulmonary disease)     Past Surgical History  Procedure Laterality Date  . Hernia repair    . Appendectomy    . Tubal ligation    . Abdominal hysterectomy      Pryor Ochoa RD, LDN Inpatient Clinical Dietitian Pager: [phone] After Hours Pager: [phone]

## 2013-07-23 NOTE — Clinical Documentation Improvement (Signed)
THIS DOCUMENT IS NOT A PERMANENT PART OF THE MEDICAL RECORD  Please update your documentation with the medical record to reflect your response to this query. If you need help knowing how to do this please call [phone].  07/23/13   To Dr. [facility],  In a better effort to capture your patient's severity of illness, reflect appropriate length of stay and utilization of resources, a review of the patient medical record has revealed the following indicators.    Based on your clinical judgment, please clarify and document in a progress note and/or discharge summary the clinical condition associated with the following supporting information:  In responding to this query please exercise your independent judgment.  The fact that a query is asked, does not imply that any particular answer is desired or expected.   Noted patient's BMI 15.82 per nutritional consult.  Please document appropriate clinical diagnosis.  Thank you    Possible Clinical Conditions?  Mild Malnutrition   Moderate Malnutrition  Severe Malnutrition   Underweight  Cachexia    Other Condition  Cannot clinically determine      Risk Factors: Cdiff Colitis , Ovarian Mass /cyst  Nutrition Recommendations: "Provide Resource Breeze BID with meals  Provide Ensure Pudding once daily  Provide Ensure Complete once daily  Provide Multivitamin with minerals daily"    You may use possible, probable, or suspect with inpatient documentation. possible, probable, suspected diagnoses MUST be documented at the time of discharge  Reviewed: additional documentation in the medical record See recent progress notes. Thank you.   Thank You, Roniya Tetro B. Bonner Puna, MD, PGY-1 07/25/2013 4:20 PM

## 2013-07-23 NOTE — Progress Notes (Signed)
Physical Therapy Treatment Patient Details Name: Meredith Bender MRN: [id number] DOB: 08/20/1925 Today's Date: 07/23/2013 Time: 6812-7517 PT Time Calculation (min): 23 min  PT Assessment / Plan / Recommendation  History of Present Illness Meredith Bender is a 80 y.o. female presenting with abdominal pain and diarrhea. PMH is significant for history of UTI, CKD stage 4, dyslipidemia, HTN.   PT Comments   Patient progressing towards PT goals. Continue with mobilization. Encouraged walking with staff  Follow Up Recommendations  No PT follow up;Supervision/Assistance - 24 hour     Does the patient have the potential to tolerate intense rehabilitation     Barriers to Discharge        Equipment Recommendations  None recommended by PT    Recommendations for Other Services    Frequency Min 3X/week   Progress towards PT Goals Progress towards PT goals: Progressing toward goals  Plan Current plan remains appropriate    Precautions / Restrictions Precautions Precautions: Fall   Pertinent Vitals/Pain no apparent distress    Mobility  Bed Mobility Supine to Sit: 6: Modified independent (Device/Increase time) Transfers Sit to Stand: 5: Supervision Stand to Sit: 5: Supervision Ambulation/Gait Ambulation/Gait Assistance: 4: Min guard Ambulation Distance (Feet): 200 Feet Assistive device: None Ambulation/Gait Assistance Details: Patient pushing IV. Slow guarded gait but steady.  Gait Pattern: Step-through pattern;Decreased stride length;Decreased trunk rotation Gait velocity: Slow    Exercises     PT Diagnosis:    PT Problem List:   PT Treatment Interventions:     PT Goals (current goals can now be found in the care plan section)    Visit Information  Last PT Received On: 07/23/13 Assistance Needed: +1 History of Present Illness: Meredith Bender is a 80 y.o. female presenting with abdominal pain and diarrhea. PMH is significant for history of UTI, CKD stage 4, dyslipidemia,  HTN.    Subjective Data      Cognition  Cognition Arousal/Alertness: Awake/alert Behavior During Therapy: WFL for tasks assessed/performed Overall Cognitive Status: Within Functional Limits for tasks assessed    Balance     End of Session PT - End of Session Equipment Utilized During Treatment: Gait belt Activity Tolerance: Patient tolerated treatment well Patient left: in bed Nurse Communication: Mobility status   GP     Jacqualyn Posey 07/23/2013, 10:02 AM 07/23/2013 Jacqualyn Posey PTA [phone] pager [phone] office

## 2013-07-23 NOTE — Progress Notes (Signed)
FMTS Attending  Note: Meredith Eniola,MD I  have seen and examined this patient, reviewed their chart. I have discussed this patient with the resident. I agree with the resident's findings, assessment and care plan.  

## 2013-07-24 DIAGNOSIS — K5289 Other specified noninfective gastroenteritis and colitis: Secondary | ICD-10-CM | POA: Diagnosis not present

## 2013-07-24 DIAGNOSIS — A0472 Enterocolitis due to Clostridium difficile, not specified as recurrent: Secondary | ICD-10-CM | POA: Diagnosis not present

## 2013-07-24 DIAGNOSIS — I10 Essential (primary) hypertension: Secondary | ICD-10-CM | POA: Diagnosis not present

## 2013-07-24 DIAGNOSIS — R197 Diarrhea, unspecified: Secondary | ICD-10-CM | POA: Diagnosis not present

## 2013-07-24 LAB — BASIC METABOLIC PANEL
BUN: 14 mg/dL (ref 6–23)
BUN: 14 mg/dL (ref 6–23)
CALCIUM: 7.8 mg/dL — AB (ref 8.4–10.5)
CHLORIDE: 110 meq/L (ref 96–112)
CO2: 13 mEq/L — ABNORMAL LOW (ref 19–32)
CO2: 16 mEq/L — ABNORMAL LOW (ref 19–32)
CREATININE: 1.59 mg/dL — AB (ref 0.50–1.10)
CREATININE: 1.65 mg/dL — AB (ref 0.50–1.10)
Calcium: 8 mg/dL — ABNORMAL LOW (ref 8.4–10.5)
Chloride: 111 mEq/L (ref 96–112)
GFR calc non Af Amer: 28 mL/min — ABNORMAL LOW (ref >90)
GFR, EST AFRICAN AMERICAN: 33 mL/min — AB (ref >90)
GFR, EST AFRICAN AMERICAN: 32 mL/min — AB (ref >90)
GFR, EST NON AFRICAN AMERICAN: 27 mL/min — AB (ref >90)
GLUCOSE: 92 mg/dL (ref 70–99)
Glucose, Bld: 92 mg/dL (ref 70–99)
Potassium: 4.2 mEq/L (ref 3.7–5.3)
Potassium: 4.8 mEq/L (ref 3.7–5.3)
Sodium: 139 mEq/L (ref 137–147)
Sodium: 141 mEq/L (ref 137–147)

## 2013-07-24 LAB — CBC
HEMATOCRIT: 30.9 % — AB (ref 36.0–46.0)
Hemoglobin: 10.8 g/dL — ABNORMAL LOW (ref 12.0–15.0)
MCH: 29.4 pg (ref 26.0–34.0)
MCHC: 35 g/dL (ref 30.0–36.0)
MCV: 84.2 fL (ref 78.0–100.0)
Platelets: 314 10*3/uL (ref 150–400)
RBC: 3.67 MIL/uL — ABNORMAL LOW (ref 3.87–5.11)
RDW: 14 % (ref 11.5–15.5)
WBC: 11.4 10*3/uL — ABNORMAL HIGH (ref 4.0–10.5)

## 2013-07-24 MED ORDER — SODIUM BICARBONATE 4.2 % IV SOLN
50.0000 meq | Freq: Once | INTRAVENOUS | Status: DC
  Filled 2013-07-24 (×2): qty 100

## 2013-07-24 MED ORDER — SODIUM BICARBONATE 4.2 % IV SOLN
50.0000 meq | Freq: Once | INTRAVENOUS | Status: AC
  Administered 2013-07-24: 50 meq via INTRAVENOUS
  Filled 2013-07-24 (×2): qty 100

## 2013-07-24 MED ORDER — TRAMADOL HCL 50 MG PO TABS
50.0000 mg | ORAL_TABLET | Freq: Two times a day (BID) | ORAL | Status: DC | PRN
  Administered 2013-07-24 – 2013-07-26 (×5): 50 mg via ORAL
  Filled 2013-07-24 (×5): qty 1

## 2013-07-24 MED ORDER — SODIUM BICARBONATE 8.4 % IV SOLN
50.0000 meq | Freq: Once | INTRAVENOUS | Status: AC
  Administered 2013-07-24: 50 meq via INTRAVENOUS
  Filled 2013-07-24: qty 50

## 2013-07-24 NOTE — Progress Notes (Signed)
Pt continues to c/o bilateral foot pain described it like "they are being cut off", tylenol 650 mg po and morphine 1 mg iv given but somewhat effective.Pt stated that her feet feel cold but they actually feel warm, +1 edema with +2 pulses,elevated on pillow and applied warm pack. Md on call notified.

## 2013-07-24 NOTE — Progress Notes (Signed)
Patient ID: Meredith Bender, female   DOB: 12-30-1925, 80 y.o.   MRN: [id number] Family Medicine Teaching Service Daily Progress Note Intern Pager: [phone]  Patient name: Meredith Bender Medical record number: [id number] Date of birth: 1925/10/01 Age: 80 y.o. Gender: female  Primary Care Provider: [name], MD Consultants: None Code Status: Partial Code, see orders and MOST form for specifics  Pt Overview and Major Events to Date:  1/1: admit to med surg GI pathogens pending. Started Cipro/Flagyl. CT mod- severe colitits 1/3: C. diff positive, clinda d/c'd. IV flagyl, start oral vancomycin 1/5: WBC downtrending 1/6: Restarted tramadol, PT rec: 24hr supervision 11/7: WBC 11.4  Chief Complaint: abdominal pain and diarrhea   Assessment and Plan:  Meredith Bender is a 80 y.o. female presenting with abdominal pain and diarrhea. PMH is significant for history of UTI, CKD stage 4, dyslipidemia, HTN.   # C. diff colitis: CT scan showing severe diffuse colitis. Neutrophilic leukocytosis (24.2, 81% PMNs) and fever to 101.44F on presentation. - C. diff PCR positive - Flagyl change to PO today (01/02- ), vancomycin PO (01/04- ) - WBC decreased 18.9 > 16.1 > 13.2 > 11.4 - Contact isolation  - IV hydration with 1/2NS - Zofran 4mg  IV prn nausea  - Tylenol po/pr prn fever  - Monitor CBC/BMP.  - Consider GI consult or addition of difficid if not improving  - Continue to monitor for signs of toxic megacolon.  - Morphine 05.-1.0mg  q3h prn pain  Peripheral neuropathy:  - Restarted home tramadol 50mg  q12h prn  CKD stage 4: Cr 2.0 is baseline. Followed by nephrology, Dr. Posey Pronto.  - Monitor BMP, continue IVF  - Cr 1.57  Hyperchloremic metabolic acidosis: likely from normal saline IVF discontinued 1/4 - Continue 1/2NS, seems to be improving with Cl down 115 > 111, though bicarb 15, will continue to monitor  Dyslipidemia:  - Restart lipitor 10mg  when tolerating po   HTN: Isolated systolic  hypertension in ED  - Hydralazine IV prn SBP >160 or DBP >100  - Restart verapamil and hydralazine home meds when tolerating po   Ovarian Mass/cyst: Incidental finding on Abd CT. CA125 level 77.8  - Gyn Onc outpatient appt made 1/21  Cachexia BMI <16, precipitous drop due likely to diarrhea, poor po, and possible CA - Nutrition consult, Ensure daily, multivitamin  FEN/GI: Bland diet, 1/2NS @ 115ml/hr Prophylaxis: Lovenox per pharmacy   Disposition: Pending clinical improvement, PT/OT rec: 24hr supervision, and pt previously living alone.  Subjective:  Pt reports continued incontinence, feels "awful". Unchanged diffuse abdominal pain. No CP, no SOB, no emesis. Tolerating po but no appetite. BM x10 yesterday  Objective:  BP 157/70  Pulse 84  Temp(Src) 98.6 F (37 C) (Oral)  Resp 20  Ht 5\' 4"  (1.626 m)  Wt 92 lb 3.2 oz (41.822 kg)  BMI 15.82 kg/m2  SpO2 91%  Exam:  General: elderly female in NAD sitting in bedside chair. HEENT: MMM, no conjunctival pallor  Cardiovascular: RRR, 2/6 systolic murmur Respiratory: CTAB, normal effort  Abdomen: +BS, Soft, diffuse tenderness R>L, non-distended. No masses appreciated.  Extremities: WWP, no edema or cyanosis Skin: warm and dry. Legs with varicosities nontender Neuro: alert and oriented, speech normal    Labs and Imaging:  Recent Labs Lab 07/21/13 0655 07/23/13 0556 07/24/13 0520  HGB 10.5* 10.6* 10.8*  HCT 31.5* 32.0* 30.9*  WBC 16.1* 13.2* 11.4*  PLT 284 276 314    Recent Labs Lab 07/19/13 1214 07/19/13 1708 07/20/13 0530  07/21/13 0655 07/23/13 0556 07/24/13 0520  NA 138  --  137 142 139 139  K 4.2  --  3.6* 4.2 4.2 4.2  CL 103  --  107 115* 111 110  CO2 19  --  17* 16* 15* 13*  GLUCOSE 101*  --  112* 98 92 92  BUN 39*  --  30* 24* 15 14  CREATININE 2.05* 1.99* 1.82* 1.93* 1.57* 1.59*  CALCIUM 8.7  --  8.0* 7.7* 7.8* 8.0*    Ct Abdomen Pelvis Wo Contrast  07/19/2013 CLINICAL DATA: Weakness, diarrhea for 5  days, chills EXAM: CT ABDOMEN AND PELVIS WITHOUT CONTRAST TECHNIQUE: Multidetector CT imaging of the abdomen and pelvis was performed following the standard protocol without intravenous contrast. COMPARISON: 07/23/2010 FINDINGS: Limited evaluation of the lung bases due to motion artifact. Mild discoid atelectasis right lung base. Small bilateral pleural effusions. Moderate hiatal hernia. Mild cardiac enlargement. Aorta is calcified diffusely. Liver and gallbladder are normal. Spleen is normal. Pancreas is normal. Mild bilateral adrenal nodularity is stable. Nonobstructing 1 cm stone midpole left kidney. Right kidney shows lobulated contour, stable from prior study. Bladder is normal. Reproductive organs not identified. Nonobstructive bowel gas pattern. Small volume of ascites. Cecum, ascending colon, transverse colon, and proximal descending colon shows severe wall thickening. There is mild to moderate wall thickening involving the colon beyond this level. There is a 2.9 cm cystic lesion in the left upper pelvis image number 56. This could represent a cyst in an ovary that has been mobilized due to prior hysterectomy. The air are no acute osseous abnormalities. IMPRESSION: 1. Small bilateral pleural effusions 2. Moderate hiatal hernia 3. Severe diffuse colitis 4. Probable 3 cm ovarian cyst on the left. This represents a change from prior study. Finding is nonspecific; ovarian malignancy not excluded. 5. Numerous other nonacute findings.   Vance Gather, MD 07/24/2013, 8:37 AM PGY-1, [place] Intern pager: [phone], text pages welcome

## 2013-07-24 NOTE — Progress Notes (Signed)
Physical Therapy Treatment Patient Details Name: SHAUNITA SENEY MRN: [id number] DOB: [birth year] Today's Date: 07/24/2013 Time: 0820-0839 PT Time Calculation (min): 19 min  PT Assessment / Plan / Recommendation  History of Present Illness Tayona L Kuznia is a 80 y.o. female presenting with abdominal pain and diarrhea. PMH is significant for history of UTI, CKD stage 4, dyslipidemia, HTN.   PT Comments   Pt has met goals for supervision, goals updated to Mod I.  Pt holds to IV pole during gait, may benefit from RW.  Follow Up Recommendations  No PT follow up;Supervision/Assistance - 24 hour     Does the patient have the potential to tolerate intense rehabilitation     Barriers to Discharge        Equipment Recommendations  Other (comment) (TBD, will try RW next session)    Recommendations for Other Services    Frequency Min 3X/week   Progress towards PT Goals Progress towards PT goals: Goals met and updated - see care plan  Plan      Precautions / Restrictions Precautions Precautions: Fall Restrictions Weight Bearing Restrictions: No   Pertinent Vitals/Pain No c/o pain    Mobility  Ambulation/Gait Ambulation Distance (Feet): 200 Feet Gait velocity: Slow General Gait Details: pt with slow, shuffling steps, holds to IV pole for support, may benefit from RW to improve energy conservation and to increase gait speed  Pt supervision with bed mobility and supine <> sit   Exercises     PT Diagnosis:    PT Problem List:   PT Treatment Interventions:     PT Goals (current goals can now be found in the care plan section)    Visit Information  Last PT Received On: 07/24/13 Assistance Needed: +1 History of Present Illness: Seville L Packard is a 80 y.o. female presenting with abdominal pain and diarrhea. PMH is significant for history of UTI, CKD stage 4, dyslipidemia, HTN.    Subjective Data      Cognition  Cognition Arousal/Alertness: Awake/alert Behavior During Therapy:  WFL for tasks assessed/performed Overall Cognitive Status: Within Functional Limits for tasks assessed    Balance     End of Session PT - End of Session Equipment Utilized During Treatment: Gait belt Activity Tolerance: Patient tolerated treatment well Patient left: in chair Nurse Communication: Mobility status   GP     Kaito Schulenburg 07/24/2013, 11:00 AM

## 2013-07-24 NOTE — Progress Notes (Signed)
Occupational Therapy Treatment Patient Details Name: Meredith Bender MRN: [id number] DOB: [birth year] Today's Date: 07/24/2013 Time: 7124-5809 OT Time Calculation (min): 42 min  OT Assessment / Plan / Recommendation  History of present illness Meredith Bender is a 80 y.o. female presenting with abdominal pain and diarrhea. PMH is significant for history of UTI, CKD stage 4, dyslipidemia, HTN.   OT comments  Pt tried ambulating with walker today, but does not want to use it. Education provided to pt during session.   Follow Up Recommendations  No OT follow up;Supervision/Assistance - 24 hour    Barriers to Discharge       Equipment Recommendations  3 in 1 bedside comode   Recommendations for Other Services    Frequency Min 2X/week   Progress towards OT Goals Progress towards OT goals: Progressing toward goals  Plan Discharge plan remains appropriate    Precautions / Restrictions Precautions Precautions: Fall Restrictions Weight Bearing Restrictions: No   Pertinent Vitals/Pain No pain reported.     ADL  Upper Body Bathing: Min guard Where Assessed - Upper Body Bathing: Unsupported standing Lower Body Bathing: Min guard Where Assessed - Lower Body Bathing: Supported sit to stand Lower Body Dressing: Supervision/safety (socks) Where Assessed - Lower Body Dressing: Supported sitting Toilet Transfer: Min Psychiatric nurse Method: Sit to Loss adjuster, chartered: Regular height toilet;Bedside commode;Grab bars Equipment Used: Gait belt;Rolling walker Transfers/Ambulation Related to ADLs: Min guard/Min A for ambulation and Min guard for transfers. ADL Comments: OT educated on energy conservation techniques. Pt performed UB/LB bathing and also applied cream-educated on energy conservation techniques during tasks-pt breathing heavily. Recommended sitting for bathing and dressing. Educated on tub transfer techniques and pt's son present. Recommended having pt with her for  getting in and out of tub. Tried walker during session, but pt did not like using it. OT explained safety concern.     OT Diagnosis:    OT Problem List:   OT Treatment Interventions:     OT Goals(current goals can now be found in the care plan section) Acute Rehab OT Goals Patient Stated Goal: not stated OT Goal Formulation: With patient Time For Goal Achievement: 07/29/13 Potential to Achieve Goals: Good ADL Goals Pt Will Perform Lower Body Bathing: with modified independence;sit to/from stand Pt Will Perform Lower Body Dressing: with modified independence;sit to/from stand Pt Will Transfer to Toilet: with modified independence;ambulating Pt Will Perform Toileting - Clothing Manipulation and hygiene: with modified independence;sit to/from stand Pt Will Perform Tub/Shower Transfer: Tub transfer;with supervision;ambulating;shower seat Additional ADL Goal #1: Pt will independently verbalize and demonstrate 3/3 energy conservation techniques.  Visit Information  Last OT Received On: 07/24/13 Assistance Needed: +1 History of Present Illness: Meredith Bender is a 80 y.o. female presenting with abdominal pain and diarrhea. PMH is significant for history of UTI, CKD stage 4, dyslipidemia, HTN.    Subjective Data      Prior Functioning       Cognition  Cognition Arousal/Alertness: Awake/alert Behavior During Therapy: WFL for tasks assessed/performed Overall Cognitive Status: Within Functional Limits for tasks assessed (however, has decreased safety awareness)    Mobility       Exercises      Balance    End of Session OT - End of Session Equipment Utilized During Treatment: Gait belt;Rolling walker Activity Tolerance: Patient tolerated treatment well Patient left: in chair;with call bell/phone within reach;with family/visitor present  GO     Benito Mccreedy OTR/L [phone] 07/24/2013, 5:31 PM

## 2013-07-24 NOTE — Progress Notes (Signed)
FMTS Attending  Note: Chyla Schlender,MD I  have seen and examined this patient, reviewed their chart. I have discussed this patient with the resident. I agree with the resident's findings, assessment and care plan.  

## 2013-07-25 DIAGNOSIS — K5289 Other specified noninfective gastroenteritis and colitis: Secondary | ICD-10-CM | POA: Diagnosis not present

## 2013-07-25 DIAGNOSIS — A0472 Enterocolitis due to Clostridium difficile, not specified as recurrent: Secondary | ICD-10-CM | POA: Diagnosis not present

## 2013-07-25 DIAGNOSIS — R197 Diarrhea, unspecified: Secondary | ICD-10-CM | POA: Diagnosis not present

## 2013-07-25 LAB — CBC
HCT: 30.7 % — ABNORMAL LOW (ref 36.0–46.0)
Hemoglobin: 10.2 g/dL — ABNORMAL LOW (ref 12.0–15.0)
MCH: 28.7 pg (ref 26.0–34.0)
MCHC: 33.2 g/dL (ref 30.0–36.0)
MCV: 86.5 fL (ref 78.0–100.0)
PLATELETS: 324 10*3/uL (ref 150–400)
RBC: 3.55 MIL/uL — ABNORMAL LOW (ref 3.87–5.11)
RDW: 14.3 % (ref 11.5–15.5)
WBC: 10.9 10*3/uL — AB (ref 4.0–10.5)

## 2013-07-25 LAB — BASIC METABOLIC PANEL
BUN: 13 mg/dL (ref 6–23)
BUN: 13 mg/dL (ref 6–23)
CALCIUM: 7.6 mg/dL — AB (ref 8.4–10.5)
CHLORIDE: 112 meq/L (ref 96–112)
CHLORIDE: 108 meq/L (ref 96–112)
CO2: 18 mEq/L — ABNORMAL LOW (ref 19–32)
CO2: 21 mEq/L (ref 19–32)
Calcium: 7.8 mg/dL — ABNORMAL LOW (ref 8.4–10.5)
Creatinine, Ser: 1.61 mg/dL — ABNORMAL HIGH (ref 0.50–1.10)
Creatinine, Ser: 1.6 mg/dL — ABNORMAL HIGH (ref 0.50–1.10)
GFR calc Af Amer: 33 mL/min — ABNORMAL LOW (ref >90)
GFR calc non Af Amer: 28 mL/min — ABNORMAL LOW (ref >90)
GFR, EST AFRICAN AMERICAN: 33 mL/min — AB (ref >90)
GFR, EST NON AFRICAN AMERICAN: 28 mL/min — AB (ref >90)
Glucose, Bld: 106 mg/dL — ABNORMAL HIGH (ref 70–99)
Glucose, Bld: 88 mg/dL (ref 70–99)
POTASSIUM: 4.9 meq/L (ref 3.7–5.3)
Potassium: 4.1 mEq/L (ref 3.7–5.3)
SODIUM: 141 meq/L (ref 137–147)
Sodium: 143 mEq/L (ref 137–147)

## 2013-07-25 MED ORDER — ATORVASTATIN CALCIUM 10 MG PO TABS
10.0000 mg | ORAL_TABLET | Freq: Every day | ORAL | Status: DC
  Administered 2013-07-25 – 2013-07-26 (×2): 10 mg via ORAL
  Filled 2013-07-25 (×2): qty 1

## 2013-07-25 MED ORDER — SODIUM BICARBONATE 8.4 % IV SOLN
100.0000 meq | Freq: Once | INTRAVENOUS | Status: AC
  Administered 2013-07-25: 100 meq via INTRAVENOUS
  Filled 2013-07-25 (×2): qty 100

## 2013-07-25 NOTE — Care Management Note (Signed)
  Page 1 of 1   07/25/2013     1:57:52 PM   CARE MANAGEMENT NOTE 07/25/2013  Patient:  Meredith Bender, Meredith Bender   Account Number:  [id number]  Date Initiated:  07/25/2013  Documentation initiated by:  Magdalen Spatz  Subjective/Objective Assessment:     Action/Plan:   Anticipated DC Date:  07/26/2013   Anticipated DC Plan:  SKILLED NURSING FACILITY         Choice offered to / List presented to:             Status of service:   Medicare Important Message given?   (If response is "NO", the following Medicare IM given date fields will be blank) Date Medicare IM given:   Date Additional Medicare IM given:    Discharge Disposition:    Per UR Regulation:    If discussed at Long Length of Stay Meetings, dates discussed:    Comments:  07-25-13 Patient was planning to discharge to home with her daughter Meredith Bender [phone], however, daughter would like to speak to social worker regarding short term rehab placement . Patient having multiple BM's and she would have stairs at her daughters. Left message with Meredith Bender [phone]  . Magdalen Spatz RN BSN [phone]

## 2013-07-25 NOTE — Progress Notes (Signed)
Physical Therapy Treatment Patient Details Name: Meredith Bender MRN: [id number] DOB: 26-Apr-1926 Today's Date: 07/25/2013 Time: 2536-6440 PT Time Calculation (min): 23 min  PT Assessment / Plan / Recommendation  History of Present Illness Meredith Bender is a 80 y.o. female presenting with abdominal pain and diarrhea. PMH is significant for history of UTI, CKD stage 4, dyslipidemia, HTN.   PT Comments   Patient agreeable to ambulate however appears more fatigued today. Patient also with loose bowels upon standing. Patient spoke with me regarding talking with her daughter about DC plans. Daughter will not be available to care for patient around the clock and daughter is worried about the flight of steps in the house. Patient and daughter are now requesting STSNF until patient can gain some functional independence prior to returning home.   Follow Up Recommendations  SNF     Does the patient have the potential to tolerate intense rehabilitation     Barriers to Discharge        Equipment Recommendations  Rolling walker with 5" wheels    Recommendations for Other Services    Frequency Min 3X/week   Progress towards PT Goals    Plan Discharge plan needs to be updated    Precautions / Restrictions Precautions Precautions: Fall   Pertinent Vitals/Pain no apparent distress     Mobility  Transfers Sit to Stand: Min assist General transfer comment: Min A required today for standing as patient appears more fatigued and tired.  Ambulation/Gait Ambulation/Gait assistance: Min guard Ambulation Distance (Feet): 200 Feet Assistive device: Rolling walker (2 wheeled) Gait velocity: Slow General Gait Details: Patient used RW this session and required MinGuard for safety as patient more fatigued     Exercises     PT Diagnosis:    PT Problem List:   PT Treatment Interventions:     PT Goals (current goals can now be found in the care plan section)    Visit Information  Last PT Received  On: 07/25/13 Assistance Needed: +1 History of Present Illness: Meredith Bender is a 80 y.o. female presenting with abdominal pain and diarrhea. PMH is significant for history of UTI, CKD stage 4, dyslipidemia, HTN.    Subjective Data      Cognition  Cognition Arousal/Alertness: Awake/alert Behavior During Therapy: WFL for tasks assessed/performed Overall Cognitive Status: Within Functional Limits for tasks assessed    Balance     End of Session PT - End of Session Equipment Utilized During Treatment: Gait belt Activity Tolerance: Patient tolerated treatment well Patient left: in chair Nurse Communication: Mobility status   GP     Jacqualyn Posey 07/25/2013, 2:25 PM 07/25/2013 Jacqualyn Posey PTA [phone] pager [phone] office

## 2013-07-25 NOTE — Progress Notes (Signed)
NUTRITION FOLLOW UP  Intervention:   Encourage PO intake Nutritional supplements BID (Pt prefers [employer] nutritional shakes that her daughter brings her)  Nutrition Dx:   Unintentional weight loss related to diarrhea and abdominal pain as evidenced by 16% weight loss in less than one month; ongoing but improving  Goal:   Pt to meet >/= 90% of their estimated nutrition needs; improving  Monitor:   PO intake; improving, 50% of meals Weight; no new weight Labs; elevated creatinine, low calcium, low GFR, low hemoglobin  Assessment:   80 yo F with PMH signficiant for prior admissions for UTI/sepsis, CKD Stage IV, HTN, remote cerebellar infarcts, C dif colitis who presents with 1 week history of abdominal pain, fever, and diarrhea.   Pt's PO intake has improved a little from 30-50% of meals to 50-60% of meals. Pt dislikes provided supplements but, reports she has been drinking nutritional shakes that her daughter brought her form home once daily. Pt reports feeling much better and states she has not had any episodes of diarrhea yet today.  Pt states her usual body weight is 118 lbs. Encouraged pt to continue intake of nutritional supplements 1-2 times daily.   Height: Ht Readings from Last 1 Encounters:  07/19/13 5\' 4"  (1.626 m)    Weight Status:   Wt Readings from Last 1 Encounters:  07/19/13 92 lb 3.2 oz (41.822 kg)    Re-estimated needs:  Kcal: 1300-1500  Protein: 40-50 grams  Fluid: 1.2-1.4 L/day  Skin: +2 RLE and LLE edema  Diet Order: Renal   Intake/Output Summary (Last 24 hours) at 07/25/13 1315 Last data filed at 07/25/13 0557  Gross per 24 hour  Intake   1900 ml  Output    300 ml  Net   1600 ml    Last BM: 1/8   Labs:   Recent Labs Lab 07/24/13 0520 07/24/13 1545 07/25/13 0550  NA 139 141 143  K 4.2 4.8 4.1  CL 110 111 112  CO2 13* 16* 18*  BUN 14 14 13   CREATININE 1.59* 1.65* 1.61*  CALCIUM 8.0* 7.8* 7.6*  GLUCOSE 92 92 106*    CBG (last 3)  No  results found for this basename: GLUCAP,  in the last 72 hours  Scheduled Meds: . atorvastatin  10 mg Oral Daily  . enoxaparin (LOVENOX) injection  30 mg Subcutaneous Q24H  . feeding supplement (ENSURE COMPLETE)  237 mL Oral Q24H  . feeding supplement (ENSURE)  1 Container Oral Q24H  . feeding supplement (RESOURCE BREEZE)  1 Container Oral BID WC  . loratadine  10 mg Oral Daily  . metroNIDAZOLE  500 mg Oral Q8H  . multivitamin with minerals  1 tablet Oral Daily  . vancomycin  125 mg Oral Q6H    Continuous Infusions:   Pryor Ochoa RD, LDN Inpatient Clinical Dietitian Pager: [phone] After Hours Pager: [phone]

## 2013-07-25 NOTE — Progress Notes (Signed)
Occupational Therapy Treatment Patient Details Name: Meredith Bender MRN: [id number] DOB: July 03, 1926 Today's Date: 07/25/2013 Time: 3235-5732 OT Time Calculation (min): 24 min  OT Assessment / Plan / Recommendation  History of present illness Meredith Bender is a 80 y.o. female presenting with abdominal pain and diarrhea. PMH is significant for history of UTI, CKD stage 4, dyslipidemia, HTN.   OT comments  Pt progressing toward goals and needs (A) with management of RW. Pt plans to d/c SNf at this time.   Follow Up Recommendations  Supervision/Assistance - 24 hour;SNF    Barriers to Discharge       Equipment Recommendations  3 in 1 bedside comode;Other (comment) (RW)    Recommendations for Other Services    Frequency Min 2X/week   Progress towards OT Goals Progress towards OT goals: Progressing toward goals  Plan Discharge plan remains appropriate    Precautions / Restrictions Precautions Precautions: Fall   Pertinent Vitals/Pain Abdominal pain on the right side.  More comfortable once sitting    ADL  Grooming: Wash/dry hands;Teeth care;Min guard Where Assessed - Grooming: Unsupported standing Toilet Transfer: Minimal assistance Toilet Transfer Method: Stand pivot Toilet Transfer Equipment: Raised toilet seat with arms (or 3-in-1 over toilet) (poor return use of RW) Toileting - Water quality scientist and Hygiene: Min guard Where Assessed - Best boy and Hygiene: Lean right and/or left Equipment Used: Gait belt;Rolling walker Transfers/Ambulation Related to ADLs: pt needs cues for use of RW and unsafe abandoning RW.  ADL Comments: Pt reports "sitting" for adls to help save energy and plan to use shower seat she has at home in the SNF. pt plans to d/c to [facility] now. pt with poor return demo of using RW to transfer to 3n1 stand pivot.Pt abandoning rw due to urgency. pt completing denture care a sink level. Pt noted to scan environment several times  before locating adl items. pt reports only needing glasses for reading. Pt using finger to scan left to right as good compensatory strategy but question visual deficits present watching functional task. pt again demonstrates deficit visual when attempting to turn and move in room with RW. Pt running rw into objects and unaware. Pt states "i am going to break my neck with this thing" in reference to RW use. Pt positioned in chair.     OT Diagnosis:    OT Problem List:   OT Treatment Interventions:     OT Goals(current goals can now be found in the care plan section) Acute Rehab OT Goals Patient Stated Goal: to get back to working in the yard OT Goal Formulation: With patient Time For Goal Achievement: 07/29/13 Potential to Achieve Goals: Good ADL Goals Pt Will Perform Lower Body Bathing: with modified independence;sit to/from stand Pt Will Perform Lower Body Dressing: with modified independence;sit to/from stand Pt Will Transfer to Toilet: with modified independence;ambulating Pt Will Perform Toileting - Clothing Manipulation and hygiene: with modified independence;sit to/from stand Pt Will Perform Tub/Shower Transfer: Tub transfer;with supervision;ambulating;shower seat Additional ADL Goal #1: Pt will independently verbalize and demonstrate 3/3 energy conservation techniques.  Visit Information  Last OT Received On: 07/25/13 Assistance Needed: +1 History of Present Illness: Meredith Bender is a 80 y.o. female presenting with abdominal pain and diarrhea. PMH is significant for history of UTI, CKD stage 4, dyslipidemia, HTN.    Subjective Data      Prior Functioning       Cognition  Cognition Arousal/Alertness: Awake/alert Behavior During Therapy: WFL for tasks assessed/performed  Overall Cognitive Status: Within Functional Limits for tasks assessed    Mobility  Transfers Sit to Stand: Min assist General transfer comment: Min A required today for standing as patient appears more  fatigued and tired.     Exercises      Balance    End of Session OT - End of Session Activity Tolerance: Patient tolerated treatment well Patient left: in chair;with call bell/phone within reach Nurse Communication: Mobility status;Precautions  GO     Peri Maris 07/25/2013, 3:18 PM Pager: [phone]

## 2013-07-25 NOTE — Progress Notes (Signed)
FMTS Attending  Note: Meredith Eniola,MD I  have seen and examined this patient, reviewed their chart. I have discussed this patient with the resident. I agree with the resident's findings, assessment and care plan.  

## 2013-07-25 NOTE — Progress Notes (Signed)
Patient ID: SAVONNA BIRCHMEIER, female   DOB: 04/01/1926, 80 y.o.   MRN: [id number] Family Medicine Teaching Service Daily Progress Note Intern Pager: [phone]  Patient name: Meredith Bender Medical record number: [id number] Date of birth: [birth year] Age: 80 y.o. Gender: female  Primary Care Provider: [name], MD Consultants: None Code Status: Partial Code, see orders and MOST form for specifics  Pt Overview and Major Events to Date:  1/1: admit to med surg GI pathogens pending. Started Cipro/Flagyl. CT mod- severe colitits 1/3: C. diff positive, clinda d/c'd. IV flagyl, start oral vancomycin 1/5: WBC downtrending 1/6: Restarted tramadol, PT rec: 24hr supervision 11/7: WBC 11.4 11/8: WBC 10.9, BM frequency reducing  Chief Complaint: abdominal pain and diarrhea   Assessment and Plan:  Meredith Bender is a 80 y.o. female presenting with abdominal pain and diarrhea. PMH is significant for history of UTI, CKD stage 4, dyslipidemia, HTN.   # C. diff colitis: CT scan showing severe diffuse colitis. Neutrophilic leukocytosis (10.2, 81% PMNs) and fever to 101.73F on presentation. - C. diff PCR positive - Flagyl po (01/02- ), vancomycin po (01/04- ) - WBC decreased 18.9 >> 10.9 - Contact isolation  - IV hydration with 1/2NS, decrease to 1/2 maintenance rate (40cc/hr) given increased po, decreased stool frequency - Zofran 4mg  IV prn nausea  - Tylenol po/pr prn fever  - Consider GI consult or addition of difficid if not improving  - Continue to monitor for signs of toxic megacolon.   Peripheral neuropathy:  - Restarted home tramadol 50mg  q12h prn  CKD stage 4: Cr 2.0 is baseline. Followed by nephrology, Dr. Posey Pronto.  - Monitor BMP, continue IVF and oral hydration - Cr stable  Metabolic acidosis: likely now from GI losses - Continue 1/2NS - Repeat bicarbonate 2 amps and recheck BMP at 4PM  Dyslipidemia:  - Restarted lipitor 10mg     HTN: Isolated systolic hypertension in ED  - Hydralazine  IV prn SBP >160 or DBP >100  - Holding verapamil and hydralazine home meds  Ovarian Mass/cyst: Incidental finding on Abd CT. CA125 level 77.8  - Gyn Onc outpatient appt made 1/21  Cachexia BMI <16, precipitous drop due likely to diarrhea, poor po, and possible CA - Nutrition consult, Ensure daily, multivitamin  FEN/GI: Bland diet, 1/2NS @ 12ml/hr Prophylaxis: Lovenox per pharmacy   Disposition: Pending clinical improvement, PT/OT rec: 24hr supervision, and pt previously living alone.  Subjective:  Pt reports continued incontinence but at much reduced frequency (2 in past 12 hours). Improving diffuse abdominal pain. No CP, no SOB, no emesis. Tolerating po, drinking more. Objective:  BP 157/72  Pulse 92  Temp(Src) 98.2 F (36.8 C) (Oral)  Resp 18  Ht 5\' 4"  (1.626 m)  Wt 92 lb 3.2 oz (41.822 kg)  BMI 15.82 kg/m2  SpO2 93%  Exam:  General: elderly female in NAD sitting in bedside chair. HEENT: MMM, no conjunctival pallor  Cardiovascular: RRR, 2/6 systolic murmur Respiratory: CTAB, normal effort  Abdomen: +BS, Soft, diffuse tenderness R>L, non-distended. No masses appreciated.  Extremities: WWP, no edema or cyanosis Skin: warm and dry. Legs with varicosities nontender Neuro: alert and oriented, speech normal    Labs and Imaging:  Recent Labs Lab 07/23/13 0556 07/24/13 0520 07/25/13 0550  HGB 10.6* 10.8* 10.2*  HCT 32.0* 30.9* 30.7*  WBC 13.2* 11.4* 10.9*  PLT 276 314 324    Recent Labs Lab 07/21/13 0655 07/23/13 0556 07/24/13 0520 07/24/13 1545 07/25/13 0550  NA 142 139 139 141  143  K 4.2 4.2 4.2 4.8 4.1  CL 115* 111 110 111 112  CO2 16* 15* 13* 16* 18*  GLUCOSE 98 92 92 92 106*  BUN 24* 15 14 14 13   CREATININE 1.93* 1.57* 1.59* 1.65* 1.61*  CALCIUM 7.7* 7.8* 8.0* 7.8* 7.6*    Ct Abdomen Pelvis Wo Contrast  07/19/2013 CLINICAL DATA: Weakness, diarrhea for 5 days, chills EXAM: CT ABDOMEN AND PELVIS WITHOUT CONTRAST TECHNIQUE: Multidetector CT imaging of the  abdomen and pelvis was performed following the standard protocol without intravenous contrast. COMPARISON: 07/23/2010 FINDINGS: Limited evaluation of the lung bases due to motion artifact. Mild discoid atelectasis right lung base. Small bilateral pleural effusions. Moderate hiatal hernia. Mild cardiac enlargement. Aorta is calcified diffusely. Liver and gallbladder are normal. Spleen is normal. Pancreas is normal. Mild bilateral adrenal nodularity is stable. Nonobstructing 1 cm stone midpole left kidney. Right kidney shows lobulated contour, stable from prior study. Bladder is normal. Reproductive organs not identified. Nonobstructive bowel gas pattern. Small volume of ascites. Cecum, ascending colon, transverse colon, and proximal descending colon shows severe wall thickening. There is mild to moderate wall thickening involving the colon beyond this level. There is a 2.9 cm cystic lesion in the left upper pelvis image number 56. This could represent a cyst in an ovary that has been mobilized due to prior hysterectomy. The air are no acute osseous abnormalities. IMPRESSION: 1. Small bilateral pleural effusions 2. Moderate hiatal hernia 3. Severe diffuse colitis 4. Probable 3 cm ovarian cyst on the left. This represents a change from prior study. Finding is nonspecific; ovarian malignancy not excluded. 5. Numerous other nonacute findings.   Meredith Gather, MD 07/25/2013, 9:43 AM PGY-1, [place] Intern pager: [phone], text pages welcome

## 2013-07-25 NOTE — Discharge Summary (Signed)
[facility] Discharge Summary  Patient name: Meredith Bender Medical record number: [id number] Date of birth: 1926/03/14 Age: 80 y.o. Gender: female Date of Admission: 07/19/2013  Date of Discharge: 07/26/2013 Admitting Physician: Zigmund Gottron, MD  Primary Care Provider: [name], MD Consultants: None  Indication for Hospitalization: Severe diarrhea and fever  Discharge Diagnoses/Problem List:  Severe C. difficile colitis History of UTI CKD stage 4 HTN Dyslipidemia  Disposition: Discharged to SNF  Discharge Condition: Stable, improved  Discharge Exam:  General: Thin elderly female in NAD HEENT: MMM, no conjunctival pallor  Cardiovascular: RRR, 2/6 systolic murmur loudest at RUSB Respiratory: CTAB, normal effort  Abdomen: +BS, Soft, non-tender, non-distended. No masses appreciated  Extremities: WWP, trace pitting edema bilateral LE with varicosities Skin: Warm and dry. No wounds noted Neuro: Alert and oriented, speech fluent and goal-directed  Brief Hospital Course: Meredith Bender is a 80 y.o. female presenting with abdominal pain and diarrhea. PMH is significant for CKD stage 4, dyslipidemia, and HTN.   She presented with diarrhea and abdominal pain after recent UTI treated with keflex and was found to have severe diffuse colitis on CT scan. She was febrile (101.1F) and had a neutrophilic leukocytosis (09.9) at that time. IV hydration and IV metronidazole were started, and due to the severity, oral vancomycin was later added when C. diff PCR returned positive. Frequency of stools caused a metabolic acidosis (HCO3 as low as 13) which was corrected with administration of bicarbonate to 24 on the day of discharge. This slowly improved and her white count steadily dropped to normal (8.4) on the day of discharge. She remained afebrile. She will continue oral flagyl and vancomycin to complete a 14 day course of antibiotics. She is being discharged to  a SNF due to concerns for 24 hours supervision recommended by PT and OT, though she is expected to be able to return to her pre-admission state of independent living.   An incidental mass was found on abdominal CT (noted to be probable 3cm ovarian cyst, though malignancy not excluded), which in the setting of a family history of ovarian cancer and nearly 20lbs. weight loss since August 2014 is concerning for malignancy. CA-125 was elevated at 77.8 and oncology consultant recommended outpatient follow up. She has an appointment schedule for 1/21 at the [facility] clinic.   She has stage 4 CKD, followed by Dr. Posey Pronto, and her creatinine was stable 1.5-1.6 with IV hydration. Her baseline appears to be around 2.0.   Given the relative hypovolemia due to diarrhea, her home doses of hydralazine and verapamil were held. Blood pressures demonstrated isolated systolic hypertension. IV hydralazine was administered on a prn basis. She was continued on lipitor 10mg  once tolerating oral medications.   Issues for Follow Up:  - Repeat BMP suggested to monitor metabolic acidosis due to diarrhea and renal function in setting of stage 4 CKD.  - Work up of incidental 3cm ovarian cyst/mass per GynOnc (appt 1/21)  Significant Procedures: None  Significant Labs and Imaging:   Recent Labs Lab 07/23/13 0556 07/24/13 0520 07/25/13 0550  WBC 13.2* 11.4* 10.9*  HGB 10.6* 10.8* 10.2*  HCT 32.0* 30.9* 30.7*  PLT 276 314 324    Recent Labs Lab 07/19/13 1214  07/23/13 0556 07/24/13 0520 07/24/13 1545 07/25/13 0550 07/25/13 1610  NA 138  < > 139 139 141 143 141  K 4.2  < > 4.2 4.2 4.8 4.1 4.9  CL 103  < > 111 110  111 112 108  CO2 19  < > 15* 13* 16* 18* 21  GLUCOSE 101*  < > 92 92 92 106* 88  BUN 39*  < > 15 14 14 13 13   CREATININE 2.05*  < > 1.57* 1.59* 1.65* 1.61* 1.60*  CALCIUM 8.7  < > 7.8* 8.0* 7.8* 7.6* 7.8*  ALKPHOS 99  --   --   --   --   --   --   AST 16  --   --   --   --   --   --   ALT 11   --   --   --   --   --   --   ALBUMIN 2.9*  --   --   --   --   --   --   < > = values in this interval not displayed.   07/20/2013 12:31  C difficile by pcr POSITIVE (A)    07/20/2013 09:45  CA 125 77.8 (H)   Ct Abdomen Pelvis Wo Contrast  07/19/2013 CLINICAL DATA: Weakness, diarrhea for 5 days, chills EXAM: CT ABDOMEN AND PELVIS WITHOUT CONTRAST TECHNIQUE: Multidetector CT imaging of the abdomen and pelvis was performed following the standard protocol without intravenous contrast. COMPARISON: 07/23/2010 FINDINGS: Limited evaluation of the lung bases due to motion artifact. Mild discoid atelectasis right lung base. Small bilateral pleural effusions. Moderate hiatal hernia. Mild cardiac enlargement. Aorta is calcified diffusely. Liver and gallbladder are normal. Spleen is normal. Pancreas is normal. Mild bilateral adrenal nodularity is stable. Nonobstructing 1 cm stone midpole left kidney. Right kidney shows lobulated contour, stable from prior study. Bladder is normal. Reproductive organs not identified. Nonobstructive bowel gas pattern. Small volume of ascites. Cecum, ascending colon, transverse colon, and proximal descending colon shows severe wall thickening. There is mild to moderate wall thickening involving the colon beyond this level. There is a 2.9 cm cystic lesion in the left upper pelvis image number 56. This could represent a cyst in an ovary that has been mobilized due to prior hysterectomy. The air are no acute osseous abnormalities. IMPRESSION: 1. Small bilateral pleural effusions 2. Moderate hiatal hernia 3. Severe diffuse colitis 4. Probable 3 cm ovarian cyst on the left. This represents a change from prior study. Finding is nonspecific; ovarian malignancy not excluded. 5. Numerous other nonacute findings.   Results/Tests Pending at Time of Discharge: None  Discharge Medications:    Medication List    ASK your doctor about these medications       acetaminophen 325 MG tablet   Commonly known as:  TYLENOL  Take 325 mg by mouth every 6 (six) hours as needed for pain.     atorvastatin 10 MG tablet  Commonly known as:  LIPITOR  Take 10 mg by mouth daily.     cephALEXin 500 MG capsule  Commonly known as:  KEFLEX  Take 1 capsule (500 mg total) by mouth 4 (four) times daily.     hydrALAZINE 25 MG tablet  Commonly known as:  APRESOLINE  Take 25 mg by mouth 3 (three) times daily.     loratadine 10 MG tablet  Commonly known as:  CLARITIN  Take 10 mg by mouth daily.     omeprazole 20 MG capsule  Commonly known as:  PRILOSEC  Take 20 mg by mouth daily.     traMADol 50 MG tablet  Commonly known as:  ULTRAM  Take one by mouth every 6 hours as needed for joint  pain     verapamil 240 MG 24 hr capsule  Commonly known as:  VERELAN PM  Take 1 capsule (240 mg total) by mouth at bedtime.     VITAMIN D PO  Take 1 tablet by mouth daily.     vitamin E 400 UNIT capsule  Generic drug:  vitamin E  Take 400 Units by mouth daily.        Discharge Instructions: Please refer to Patient Instructions section of EMR for full details.  Patient was counseled important signs and symptoms that should prompt return to medical care, changes in medications, dietary instructions, activity restrictions, and follow up appointments.   Follow-Up Appointments: Follow-up Information   Follow up with [name], MD On 07/31/2013. (8:30am)    Specialties:  Family Medicine, Sports Medicine   Contact information:   [address] [place] [address] [phone]       Follow up with [facility] On 08/07/2013. (12:45pm)    Specialty:  Obstetrics and Gynecology   Contact information:   [place] [address] [phone]      Vance Gather, MD 07/25/2013, 6:12 PM PGY-1, [place]

## 2013-07-26 DIAGNOSIS — N184 Chronic kidney disease, stage 4 (severe): Secondary | ICD-10-CM | POA: Diagnosis not present

## 2013-07-26 DIAGNOSIS — N189 Chronic kidney disease, unspecified: Secondary | ICD-10-CM | POA: Diagnosis not present

## 2013-07-26 DIAGNOSIS — Z5189 Encounter for other specified aftercare: Secondary | ICD-10-CM | POA: Diagnosis not present

## 2013-07-26 DIAGNOSIS — IMO0002 Reserved for concepts with insufficient information to code with codable children: Secondary | ICD-10-CM | POA: Diagnosis not present

## 2013-07-26 DIAGNOSIS — I10 Essential (primary) hypertension: Secondary | ICD-10-CM | POA: Diagnosis not present

## 2013-07-26 DIAGNOSIS — D72829 Elevated white blood cell count, unspecified: Secondary | ICD-10-CM | POA: Diagnosis not present

## 2013-07-26 DIAGNOSIS — J449 Chronic obstructive pulmonary disease, unspecified: Secondary | ICD-10-CM | POA: Diagnosis not present

## 2013-07-26 DIAGNOSIS — R279 Unspecified lack of coordination: Secondary | ICD-10-CM | POA: Diagnosis not present

## 2013-07-26 DIAGNOSIS — Z8673 Personal history of transient ischemic attack (TIA), and cerebral infarction without residual deficits: Secondary | ICD-10-CM | POA: Diagnosis not present

## 2013-07-26 DIAGNOSIS — I129 Hypertensive chronic kidney disease with stage 1 through stage 4 chronic kidney disease, or unspecified chronic kidney disease: Secondary | ICD-10-CM | POA: Diagnosis present

## 2013-07-26 DIAGNOSIS — I5033 Acute on chronic diastolic (congestive) heart failure: Secondary | ICD-10-CM | POA: Diagnosis not present

## 2013-07-26 DIAGNOSIS — S72143A Displaced intertrochanteric fracture of unspecified femur, initial encounter for closed fracture: Secondary | ICD-10-CM | POA: Diagnosis not present

## 2013-07-26 DIAGNOSIS — N390 Urinary tract infection, site not specified: Secondary | ICD-10-CM | POA: Diagnosis not present

## 2013-07-26 DIAGNOSIS — R112 Nausea with vomiting, unspecified: Secondary | ICD-10-CM | POA: Diagnosis not present

## 2013-07-26 DIAGNOSIS — R52 Pain, unspecified: Secondary | ICD-10-CM | POA: Diagnosis not present

## 2013-07-26 DIAGNOSIS — R627 Adult failure to thrive: Secondary | ICD-10-CM | POA: Diagnosis not present

## 2013-07-26 DIAGNOSIS — K219 Gastro-esophageal reflux disease without esophagitis: Secondary | ICD-10-CM | POA: Diagnosis present

## 2013-07-26 DIAGNOSIS — J438 Other emphysema: Secondary | ICD-10-CM | POA: Diagnosis not present

## 2013-07-26 DIAGNOSIS — I509 Heart failure, unspecified: Secondary | ICD-10-CM | POA: Diagnosis present

## 2013-07-26 DIAGNOSIS — J9600 Acute respiratory failure, unspecified whether with hypoxia or hypercapnia: Secondary | ICD-10-CM | POA: Diagnosis not present

## 2013-07-26 DIAGNOSIS — Z79899 Other long term (current) drug therapy: Secondary | ICD-10-CM | POA: Diagnosis not present

## 2013-07-26 DIAGNOSIS — A0472 Enterocolitis due to Clostridium difficile, not specified as recurrent: Secondary | ICD-10-CM | POA: Diagnosis not present

## 2013-07-26 DIAGNOSIS — R079 Chest pain, unspecified: Secondary | ICD-10-CM | POA: Diagnosis not present

## 2013-07-26 DIAGNOSIS — N179 Acute kidney failure, unspecified: Secondary | ICD-10-CM | POA: Diagnosis present

## 2013-07-26 DIAGNOSIS — M6281 Muscle weakness (generalized): Secondary | ICD-10-CM | POA: Diagnosis not present

## 2013-07-26 DIAGNOSIS — E785 Hyperlipidemia, unspecified: Secondary | ICD-10-CM | POA: Diagnosis not present

## 2013-07-26 DIAGNOSIS — R197 Diarrhea, unspecified: Secondary | ICD-10-CM | POA: Diagnosis not present

## 2013-07-26 DIAGNOSIS — R1900 Intra-abdominal and pelvic swelling, mass and lump, unspecified site: Secondary | ICD-10-CM | POA: Diagnosis not present

## 2013-07-26 DIAGNOSIS — M25559 Pain in unspecified hip: Secondary | ICD-10-CM | POA: Diagnosis not present

## 2013-07-26 LAB — BASIC METABOLIC PANEL
BUN: 12 mg/dL (ref 6–23)
CHLORIDE: 110 meq/L (ref 96–112)
CO2: 24 meq/L (ref 19–32)
Calcium: 7.7 mg/dL — ABNORMAL LOW (ref 8.4–10.5)
Creatinine, Ser: 1.61 mg/dL — ABNORMAL HIGH (ref 0.50–1.10)
GFR calc Af Amer: 33 mL/min — ABNORMAL LOW (ref >90)
GFR calc non Af Amer: 28 mL/min — ABNORMAL LOW (ref >90)
Glucose, Bld: 123 mg/dL — ABNORMAL HIGH (ref 70–99)
Potassium: 4.2 mEq/L (ref 3.7–5.3)
SODIUM: 144 meq/L (ref 137–147)

## 2013-07-26 LAB — CBC
HEMATOCRIT: 31.6 % — AB (ref 36.0–46.0)
Hemoglobin: 10.4 g/dL — ABNORMAL LOW (ref 12.0–15.0)
MCH: 28.6 pg (ref 26.0–34.0)
MCHC: 32.9 g/dL (ref 30.0–36.0)
MCV: 86.8 fL (ref 78.0–100.0)
PLATELETS: 364 10*3/uL (ref 150–400)
RBC: 3.64 MIL/uL — ABNORMAL LOW (ref 3.87–5.11)
RDW: 14.6 % (ref 11.5–15.5)
WBC: 8.4 10*3/uL (ref 4.0–10.5)

## 2013-07-26 MED ORDER — VERAPAMIL HCL ER 240 MG PO TBCR
240.0000 mg | EXTENDED_RELEASE_TABLET | Freq: Every day | ORAL | Status: DC
  Filled 2013-07-26: qty 1

## 2013-07-26 MED ORDER — VERAPAMIL HCL ER 240 MG PO CP24: 240.0000 mg | ORAL_CAPSULE | Freq: Once | ORAL | Status: DC

## 2013-07-26 MED ORDER — VERAPAMIL HCL ER 240 MG PO TBCR
240.0000 mg | EXTENDED_RELEASE_TABLET | Freq: Once | ORAL | Status: AC
  Administered 2013-07-26: 240 mg via ORAL
  Filled 2013-07-26: qty 1

## 2013-07-26 MED ORDER — HYDRALAZINE HCL 25 MG PO TABS
25.0000 mg | ORAL_TABLET | Freq: Three times a day (TID) | ORAL | Status: DC
  Administered 2013-07-26 (×2): 25 mg via ORAL
  Filled 2013-07-26 (×2): qty 1

## 2013-07-26 MED ORDER — METRONIDAZOLE 500 MG PO TABS: 500.0000 mg | ORAL_TABLET | Freq: Three times a day (TID) | ORAL | Status: DC

## 2013-07-26 MED ORDER — VANCOMYCIN 50 MG/ML ORAL SOLUTION: 125.0000 mg | Freq: Four times a day (QID) | ORAL | Status: DC

## 2013-07-26 MED ORDER — VERAPAMIL HCL ER 240 MG PO CP24: 240.0000 mg | ORAL_CAPSULE | Freq: Every day | ORAL | Status: DC

## 2013-07-26 NOTE — Progress Notes (Signed)
CSW spoke with Meredith Bender [id number] and they chose to go to [facility] and have spoken with them about their choice.  CSWW left a message for the admissions coordinator asking if the Pt can d/c to them today. CSW awaiting a call back however, CSW will send facility the d/c summary and AVS for d/c planning when available.   CSW will follow for d/c planning to facility today.       [facility]  4N 1[address]16;  [phone] Phone: [phone]

## 2013-07-26 NOTE — Discharge Summary (Signed)
FMTS Attending  Note: Meredith Eniola,MD I  have seen and examined this patient, reviewed their chart. I have discussed this patient with the resident. I agree with the resident's findings, assessment and care plan.  Patient doing much better today with less abdominal pain and diarrhea,denies any concern today. Examination grossly benign except for elevated BP with systolic of 520. Patient's home BP medication was held,plan is to restart her hydralazine and recheck BP after restarting her meds before D/C to SNF today.

## 2013-07-26 NOTE — Progress Notes (Signed)
Report called to [facility] at [employer].

## 2013-07-26 NOTE — Progress Notes (Signed)
Agree with PTA.    Gyan Cambre, PT [phone]  

## 2013-07-26 NOTE — Progress Notes (Signed)
Pt to be d/c today to [facility].   Pt and family agreeable. Confirmed plans with facility.  Plan transfer via EMS. Dtr is on the way to the facility to meet with Abigail Butts and sign paperwork prior to calling the transport. CSW provided the nurse with PTAR in the event transport in the event that CSW is not available.    [facility]  4N 1[address]16;  [phone] Phone: [phone]

## 2013-07-26 NOTE — Progress Notes (Signed)
CSW spoke with the facility [facility]ail [facility]ts at [facility]) and they will be able to accept the Pt today.   CSW will continue to work with Pt's daughter for d/c planning to facility today.     [facility]  4N 1[address]16;  [phone] Phone: [phone]

## 2013-07-29 DIAGNOSIS — N189 Chronic kidney disease, unspecified: Secondary | ICD-10-CM | POA: Diagnosis not present

## 2013-07-29 DIAGNOSIS — R1900 Intra-abdominal and pelvic swelling, mass and lump, unspecified site: Secondary | ICD-10-CM | POA: Diagnosis not present

## 2013-07-29 DIAGNOSIS — A0472 Enterocolitis due to Clostridium difficile, not specified as recurrent: Secondary | ICD-10-CM | POA: Diagnosis not present

## 2013-07-30 DIAGNOSIS — A0472 Enterocolitis due to Clostridium difficile, not specified as recurrent: Secondary | ICD-10-CM | POA: Diagnosis not present

## 2013-07-30 DIAGNOSIS — N189 Chronic kidney disease, unspecified: Secondary | ICD-10-CM | POA: Diagnosis not present

## 2013-07-30 DIAGNOSIS — R1900 Intra-abdominal and pelvic swelling, mass and lump, unspecified site: Secondary | ICD-10-CM | POA: Diagnosis not present

## 2013-07-31 ENCOUNTER — Inpatient Hospital Stay: Payer: Medicare Other | Admitting: Family Medicine

## 2013-08-07 ENCOUNTER — Ambulatory Visit: Payer: Medicare Other | Admitting: Obstetrics & Gynecology

## 2013-08-07 DIAGNOSIS — I10 Essential (primary) hypertension: Secondary | ICD-10-CM | POA: Diagnosis not present

## 2013-08-07 DIAGNOSIS — R627 Adult failure to thrive: Secondary | ICD-10-CM | POA: Diagnosis not present

## 2013-08-07 DIAGNOSIS — N184 Chronic kidney disease, stage 4 (severe): Secondary | ICD-10-CM | POA: Diagnosis not present

## 2013-08-07 DIAGNOSIS — N390 Urinary tract infection, site not specified: Secondary | ICD-10-CM | POA: Diagnosis not present

## 2013-08-08 ENCOUNTER — Telehealth: Payer: Self-pay | Admitting: Family Medicine

## 2013-08-08 NOTE — Telephone Encounter (Signed)
Daughter of Brekyn called and is very concerned about her mother. Her mother is at [facility] and they told her that the discharge notes states that her mother has a growth on her uterus and they wanted her to see a doctor at [facility]. She is concerned because her mother had a total hysterectomy several years ago. The daughter told them not to take her mother to any doctor until she could speak to [facility] and they could go through her mother's charts. Please call at any time day or night and ASAP. jw

## 2013-08-09 NOTE — Telephone Encounter (Signed)
Dear [name] Team I actually spoke with her Mom about it while she was in hospital but I fdoubt she remembers cause she was really sick and kind of out of it that day. The growth is a cyst and it is in teh area of the OVARY not the uterus so even if she ahd a hysterectomy she still likely has her ovaries, I have looked at the scan.   I had hope we could discuss on her F/U appt but that was cancelled due to weather.   l think the next thing we need to do is a pelvic US---I do not think it is urgent so if we get it done sometime in next month we will be fine. If she wants to set it up, please set up pelvic US for adnexal mass  And please reschedy=ule her to see me my next clinic day. If her duaghter wants me to call her, I will be back in office Monday [facility]! [name]

## 2013-08-09 NOTE — Telephone Encounter (Signed)
Spoke with patients daughter,she states she'll wait till she see you in clinic to discuss scheduling [place].Patient has been scheduled as you request on your next clinic day.February 4th. Thank you. Noralee Dutko, Lewie Loron

## 2013-08-19 DIAGNOSIS — A0472 Enterocolitis due to Clostridium difficile, not specified as recurrent: Secondary | ICD-10-CM | POA: Diagnosis not present

## 2013-08-19 DIAGNOSIS — N189 Chronic kidney disease, unspecified: Secondary | ICD-10-CM | POA: Diagnosis not present

## 2013-08-19 DIAGNOSIS — R1900 Intra-abdominal and pelvic swelling, mass and lump, unspecified site: Secondary | ICD-10-CM | POA: Diagnosis not present

## 2013-08-20 DIAGNOSIS — R1900 Intra-abdominal and pelvic swelling, mass and lump, unspecified site: Secondary | ICD-10-CM | POA: Diagnosis not present

## 2013-08-20 DIAGNOSIS — R079 Chest pain, unspecified: Secondary | ICD-10-CM | POA: Diagnosis not present

## 2013-08-20 DIAGNOSIS — N184 Chronic kidney disease, stage 4 (severe): Secondary | ICD-10-CM | POA: Diagnosis not present

## 2013-08-20 DIAGNOSIS — A0472 Enterocolitis due to Clostridium difficile, not specified as recurrent: Secondary | ICD-10-CM | POA: Diagnosis not present

## 2013-08-21 ENCOUNTER — Ambulatory Visit: Payer: Medicare Other | Admitting: Family Medicine

## 2013-08-21 DIAGNOSIS — R079 Chest pain, unspecified: Secondary | ICD-10-CM | POA: Diagnosis not present

## 2013-08-21 DIAGNOSIS — N184 Chronic kidney disease, stage 4 (severe): Secondary | ICD-10-CM | POA: Diagnosis not present

## 2013-08-21 DIAGNOSIS — R1900 Intra-abdominal and pelvic swelling, mass and lump, unspecified site: Secondary | ICD-10-CM | POA: Diagnosis not present

## 2013-08-22 DIAGNOSIS — A0472 Enterocolitis due to Clostridium difficile, not specified as recurrent: Secondary | ICD-10-CM | POA: Diagnosis not present

## 2013-08-22 DIAGNOSIS — N184 Chronic kidney disease, stage 4 (severe): Secondary | ICD-10-CM | POA: Diagnosis not present

## 2013-08-22 DIAGNOSIS — R079 Chest pain, unspecified: Secondary | ICD-10-CM | POA: Diagnosis not present

## 2013-08-22 DIAGNOSIS — R1900 Intra-abdominal and pelvic swelling, mass and lump, unspecified site: Secondary | ICD-10-CM | POA: Diagnosis not present

## 2013-08-26 DIAGNOSIS — A0472 Enterocolitis due to Clostridium difficile, not specified as recurrent: Secondary | ICD-10-CM | POA: Diagnosis not present

## 2013-08-26 DIAGNOSIS — N184 Chronic kidney disease, stage 4 (severe): Secondary | ICD-10-CM | POA: Diagnosis not present

## 2013-08-26 DIAGNOSIS — R1900 Intra-abdominal and pelvic swelling, mass and lump, unspecified site: Secondary | ICD-10-CM | POA: Diagnosis not present

## 2013-08-26 DIAGNOSIS — R079 Chest pain, unspecified: Secondary | ICD-10-CM | POA: Diagnosis not present

## 2013-08-29 ENCOUNTER — Inpatient Hospital Stay (HOSPITAL_COMMUNITY): Payer: Medicare Other

## 2013-08-29 ENCOUNTER — Inpatient Hospital Stay (HOSPITAL_COMMUNITY): Payer: Medicare Other | Admitting: Anesthesiology

## 2013-08-29 ENCOUNTER — Encounter (HOSPITAL_COMMUNITY): Payer: Self-pay | Admitting: Emergency Medicine

## 2013-08-29 ENCOUNTER — Emergency Department (HOSPITAL_COMMUNITY): Payer: Medicare Other

## 2013-08-29 ENCOUNTER — Inpatient Hospital Stay (HOSPITAL_COMMUNITY)
Admission: EM | Admit: 2013-08-29 | Discharge: 2013-09-02 | DRG: 480 | Disposition: A | Discharge status: Skilled Nursing Facility | Payer: Medicare Other | Attending: Internal Medicine | Admitting: Internal Medicine

## 2013-08-29 ENCOUNTER — Encounter (HOSPITAL_COMMUNITY): Payer: Medicare Other | Admitting: Anesthesiology

## 2013-08-29 ENCOUNTER — Encounter (HOSPITAL_COMMUNITY)
Admission: EM | Disposition: A | Discharge status: Skilled Nursing Facility | Payer: Medicare Other | Source: Home / Self Care | Attending: Internal Medicine

## 2013-08-29 DIAGNOSIS — Z8673 Personal history of transient ischemic attack (TIA), and cerebral infarction without residual deficits: Secondary | ICD-10-CM

## 2013-08-29 DIAGNOSIS — Z87440 Personal history of urinary (tract) infections: Secondary | ICD-10-CM

## 2013-08-29 DIAGNOSIS — I509 Heart failure, unspecified: Secondary | ICD-10-CM | POA: Diagnosis present

## 2013-08-29 DIAGNOSIS — I129 Hypertensive chronic kidney disease with stage 1 through stage 4 chronic kidney disease, or unspecified chronic kidney disease: Secondary | ICD-10-CM | POA: Diagnosis present

## 2013-08-29 DIAGNOSIS — S72143A Displaced intertrochanteric fracture of unspecified femur, initial encounter for closed fracture: Principal | ICD-10-CM

## 2013-08-29 DIAGNOSIS — N179 Acute kidney failure, unspecified: Secondary | ICD-10-CM | POA: Diagnosis present

## 2013-08-29 DIAGNOSIS — R1900 Intra-abdominal and pelvic swelling, mass and lump, unspecified site: Secondary | ICD-10-CM | POA: Diagnosis not present

## 2013-08-29 DIAGNOSIS — S72141A Displaced intertrochanteric fracture of right femur, initial encounter for closed fracture: Secondary | ICD-10-CM | POA: Diagnosis present

## 2013-08-29 DIAGNOSIS — Z79899 Other long term (current) drug therapy: Secondary | ICD-10-CM | POA: Diagnosis not present

## 2013-08-29 DIAGNOSIS — J9600 Acute respiratory failure, unspecified whether with hypoxia or hypercapnia: Secondary | ICD-10-CM | POA: Diagnosis not present

## 2013-08-29 DIAGNOSIS — W19XXXA Unspecified fall, initial encounter: Secondary | ICD-10-CM | POA: Diagnosis present

## 2013-08-29 DIAGNOSIS — IMO0002 Reserved for concepts with insufficient information to code with codable children: Secondary | ICD-10-CM | POA: Diagnosis not present

## 2013-08-29 DIAGNOSIS — N184 Chronic kidney disease, stage 4 (severe): Secondary | ICD-10-CM

## 2013-08-29 DIAGNOSIS — K219 Gastro-esophageal reflux disease without esophagitis: Secondary | ICD-10-CM | POA: Diagnosis present

## 2013-08-29 DIAGNOSIS — J810 Acute pulmonary edema: Secondary | ICD-10-CM

## 2013-08-29 DIAGNOSIS — J4489 Other specified chronic obstructive pulmonary disease: Secondary | ICD-10-CM | POA: Diagnosis not present

## 2013-08-29 DIAGNOSIS — K529 Noninfective gastroenteritis and colitis, unspecified: Secondary | ICD-10-CM

## 2013-08-29 DIAGNOSIS — N289 Disorder of kidney and ureter, unspecified: Secondary | ICD-10-CM | POA: Diagnosis present

## 2013-08-29 DIAGNOSIS — R52 Pain, unspecified: Secondary | ICD-10-CM | POA: Diagnosis not present

## 2013-08-29 DIAGNOSIS — S72009A Fracture of unspecified part of neck of unspecified femur, initial encounter for closed fracture: Secondary | ICD-10-CM | POA: Diagnosis not present

## 2013-08-29 DIAGNOSIS — I10 Essential (primary) hypertension: Secondary | ICD-10-CM | POA: Diagnosis not present

## 2013-08-29 DIAGNOSIS — E785 Hyperlipidemia, unspecified: Secondary | ICD-10-CM | POA: Diagnosis not present

## 2013-08-29 DIAGNOSIS — R079 Chest pain, unspecified: Secondary | ICD-10-CM | POA: Diagnosis not present

## 2013-08-29 DIAGNOSIS — J449 Chronic obstructive pulmonary disease, unspecified: Secondary | ICD-10-CM | POA: Diagnosis not present

## 2013-08-29 DIAGNOSIS — I5033 Acute on chronic diastolic (congestive) heart failure: Secondary | ICD-10-CM | POA: Diagnosis not present

## 2013-08-29 DIAGNOSIS — S72009D Fracture of unspecified part of neck of unspecified femur, subsequent encounter for closed fracture with routine healing: Secondary | ICD-10-CM | POA: Diagnosis not present

## 2013-08-29 DIAGNOSIS — I5032 Chronic diastolic (congestive) heart failure: Secondary | ICD-10-CM

## 2013-08-29 DIAGNOSIS — R269 Unspecified abnormalities of gait and mobility: Secondary | ICD-10-CM | POA: Diagnosis not present

## 2013-08-29 DIAGNOSIS — M25559 Pain in unspecified hip: Secondary | ICD-10-CM | POA: Diagnosis not present

## 2013-08-29 DIAGNOSIS — A084 Viral intestinal infection, unspecified: Secondary | ICD-10-CM

## 2013-08-29 DIAGNOSIS — A0472 Enterocolitis due to Clostridium difficile, not specified as recurrent: Secondary | ICD-10-CM | POA: Diagnosis not present

## 2013-08-29 DIAGNOSIS — M6281 Muscle weakness (generalized): Secondary | ICD-10-CM | POA: Diagnosis not present

## 2013-08-29 DIAGNOSIS — J438 Other emphysema: Secondary | ICD-10-CM | POA: Diagnosis not present

## 2013-08-29 DIAGNOSIS — J9601 Acute respiratory failure with hypoxia: Secondary | ICD-10-CM

## 2013-08-29 DIAGNOSIS — I69998 Other sequelae following unspecified cerebrovascular disease: Secondary | ICD-10-CM | POA: Diagnosis not present

## 2013-08-29 DIAGNOSIS — Z0000 Encounter for general adult medical examination without abnormal findings: Secondary | ICD-10-CM

## 2013-08-29 HISTORY — PX: INTRAMEDULLARY (IM) NAIL INTERTROCHANTERIC: SHX5875

## 2013-08-29 LAB — POCT I-STAT, CHEM 8
BUN: 15 mg/dL (ref 6–23)
CALCIUM ION: 1.23 mmol/L (ref 1.13–1.30)
Chloride: 105 mEq/L (ref 96–112)
Creatinine, Ser: 1.8 mg/dL — ABNORMAL HIGH (ref 0.50–1.10)
Glucose, Bld: 104 mg/dL — ABNORMAL HIGH (ref 70–99)
HEMATOCRIT: 34 % — AB (ref 36.0–46.0)
Hemoglobin: 11.6 g/dL — ABNORMAL LOW (ref 12.0–15.0)
Potassium: 4.3 mEq/L (ref 3.7–5.3)
Sodium: 142 mEq/L (ref 137–147)
TCO2: 25 mmol/L (ref 0–100)

## 2013-08-29 LAB — URINALYSIS, ROUTINE W REFLEX MICROSCOPIC
BILIRUBIN URINE: NEGATIVE
Glucose, UA: NEGATIVE mg/dL
KETONES UR: NEGATIVE mg/dL
Leukocytes, UA: NEGATIVE
NITRITE: NEGATIVE
PROTEIN: 30 mg/dL — AB
Specific Gravity, Urine: 1.014 (ref 1.005–1.030)
Urobilinogen, UA: 0.2 mg/dL (ref 0.0–1.0)
pH: 6.5 (ref 5.0–8.0)

## 2013-08-29 LAB — CBC WITH DIFFERENTIAL/PLATELET
Basophils Absolute: 0 10*3/uL (ref 0.0–0.1)
Basophils Relative: 0 % (ref 0–1)
EOS ABS: 0.1 10*3/uL (ref 0.0–0.7)
EOS PCT: 1 % (ref 0–5)
HCT: 33.9 % — ABNORMAL LOW (ref 36.0–46.0)
HEMOGLOBIN: 11.1 g/dL — AB (ref 12.0–15.0)
LYMPHS ABS: 0.7 10*3/uL (ref 0.7–4.0)
LYMPHS PCT: 7 % — AB (ref 12–46)
MCH: 28.5 pg (ref 26.0–34.0)
MCHC: 32.7 g/dL (ref 30.0–36.0)
MCV: 87.1 fL (ref 78.0–100.0)
MONOS PCT: 8 % (ref 3–12)
Monocytes Absolute: 0.8 10*3/uL (ref 0.1–1.0)
NEUTROS PCT: 84 % — AB (ref 43–77)
Neutro Abs: 8.5 10*3/uL — ABNORMAL HIGH (ref 1.7–7.7)
Platelets: 275 10*3/uL (ref 150–400)
RBC: 3.89 MIL/uL (ref 3.87–5.11)
RDW: 14.5 % (ref 11.5–15.5)
WBC: 10.1 10*3/uL (ref 4.0–10.5)

## 2013-08-29 LAB — POCT I-STAT TROPONIN I: Troponin i, poc: 0.01 ng/mL (ref 0.00–0.08)

## 2013-08-29 LAB — URINE MICROSCOPIC-ADD ON

## 2013-08-29 LAB — BASIC METABOLIC PANEL
BUN: 16 mg/dL (ref 6–23)
CO2: 24 mEq/L (ref 19–32)
Calcium: 9.3 mg/dL (ref 8.4–10.5)
Chloride: 104 mEq/L (ref 96–112)
Creatinine, Ser: 1.62 mg/dL — ABNORMAL HIGH (ref 0.50–1.10)
GFR calc Af Amer: 32 mL/min — ABNORMAL LOW (ref >90)
GFR calc non Af Amer: 28 mL/min — ABNORMAL LOW (ref >90)
Glucose, Bld: 105 mg/dL — ABNORMAL HIGH (ref 70–99)
Potassium: 4.3 mEq/L (ref 3.7–5.3)
Sodium: 141 mEq/L (ref 137–147)

## 2013-08-29 LAB — PROTIME-INR
INR: 1.01 (ref 0.00–1.49)
Prothrombin Time: 13.1 seconds (ref 11.6–15.2)

## 2013-08-29 LAB — SURGICAL PCR SCREEN
MRSA, PCR: NEGATIVE
Staphylococcus aureus: NEGATIVE

## 2013-08-29 SURGERY — FIXATION, FRACTURE, INTERTROCHANTERIC, WITH INTRAMEDULLARY ROD
Anesthesia: General | Site: Hip | Laterality: Right

## 2013-08-29 MED ORDER — VERAPAMIL HCL ER 240 MG PO CP24: 240.0000 mg | ORAL_CAPSULE | Freq: Every day | ORAL | Status: DC

## 2013-08-29 MED ORDER — PROPOFOL 10 MG/ML IV BOLUS
INTRAVENOUS | Status: AC
  Filled 2013-08-29: qty 20

## 2013-08-29 MED ORDER — VERAPAMIL HCL ER 240 MG PO TBCR
240.0000 mg | EXTENDED_RELEASE_TABLET | Freq: Every day | ORAL | Status: DC
  Administered 2013-08-29 – 2013-09-01 (×3): 240 mg via ORAL
  Filled 2013-08-29 (×5): qty 1

## 2013-08-29 MED ORDER — ACETAMINOPHEN 650 MG RE SUPP: 650.0000 mg | Freq: Four times a day (QID) | RECTAL | Status: DC | PRN

## 2013-08-29 MED ORDER — ENOXAPARIN SODIUM 40 MG/0.4ML ~~LOC~~ SOLN
40.0000 mg | SUBCUTANEOUS | Status: DC
  Administered 2013-08-30 – 2013-09-01 (×3): 40 mg via SUBCUTANEOUS
  Filled 2013-08-29 (×5): qty 0.4

## 2013-08-29 MED ORDER — CHLORHEXIDINE GLUCONATE 4 % EX LIQD
60.0000 mL | Freq: Once | CUTANEOUS | Status: DC
  Filled 2013-08-29: qty 60

## 2013-08-29 MED ORDER — CLINDAMYCIN PHOSPHATE 900 MG/50ML IV SOLN
900.0000 mg | INTRAVENOUS | Status: AC
  Administered 2013-08-29: 900 mg via INTRAVENOUS
  Filled 2013-08-29: qty 50

## 2013-08-29 MED ORDER — PHENYLEPHRINE 40 MCG/ML (10ML) SYRINGE FOR IV PUSH (FOR BLOOD PRESSURE SUPPORT)
PREFILLED_SYRINGE | INTRAVENOUS | Status: AC
  Filled 2013-08-29: qty 10

## 2013-08-29 MED ORDER — ONDANSETRON HCL 4 MG/2ML IJ SOLN
4.0000 mg | Freq: Once | INTRAMUSCULAR | Status: AC
  Administered 2013-08-29: 4 mg via INTRAVENOUS
  Filled 2013-08-29: qty 2

## 2013-08-29 MED ORDER — LIDOCAINE HCL (CARDIAC) 20 MG/ML IV SOLN
INTRAVENOUS | Status: DC | PRN
  Administered 2013-08-29: 100 mg via INTRAVENOUS

## 2013-08-29 MED ORDER — 0.9 % SODIUM CHLORIDE (POUR BTL) OPTIME
TOPICAL | Status: DC | PRN
  Administered 2013-08-29: 1000 mL

## 2013-08-29 MED ORDER — ASPIRIN EC 325 MG PO TBEC
325.0000 mg | DELAYED_RELEASE_TABLET | Freq: Two times a day (BID) | ORAL | Status: DC
  Filled 2013-08-29 (×3): qty 1

## 2013-08-29 MED ORDER — SIMVASTATIN 20 MG PO TABS
20.0000 mg | ORAL_TABLET | Freq: Every day | ORAL | Status: DC
  Filled 2013-08-29: qty 1

## 2013-08-29 MED ORDER — ROCURONIUM BROMIDE 50 MG/5ML IV SOLN
INTRAVENOUS | Status: AC
  Filled 2013-08-29: qty 1

## 2013-08-29 MED ORDER — MORPHINE SULFATE 4 MG/ML IJ SOLN
4.0000 mg | Freq: Once | INTRAMUSCULAR | Status: AC
  Administered 2013-08-29: 4 mg via INTRAVENOUS
  Filled 2013-08-29: qty 1

## 2013-08-29 MED ORDER — ARTIFICIAL TEARS OP OINT
TOPICAL_OINTMENT | OPHTHALMIC | Status: DC | PRN
  Administered 2013-08-29: 1 via OPHTHALMIC

## 2013-08-29 MED ORDER — ONDANSETRON HCL 4 MG/2ML IJ SOLN: 4.0000 mg | Freq: Once | INTRAMUSCULAR | Status: DC | PRN

## 2013-08-29 MED ORDER — MIDAZOLAM HCL 5 MG/5ML IJ SOLN
INTRAMUSCULAR | Status: DC | PRN
  Administered 2013-08-29: 0.5 mg via INTRAVENOUS

## 2013-08-29 MED ORDER — METOCLOPRAMIDE HCL 10 MG PO TABS: 5.0000 mg | ORAL_TABLET | Freq: Three times a day (TID) | ORAL | Status: DC | PRN

## 2013-08-29 MED ORDER — PANTOPRAZOLE SODIUM 40 MG PO TBEC
40.0000 mg | DELAYED_RELEASE_TABLET | Freq: Every day | ORAL | Status: DC
  Administered 2013-08-30 – 2013-09-02 (×4): 40 mg via ORAL
  Filled 2013-08-29 (×3): qty 1

## 2013-08-29 MED ORDER — MENTHOL 3 MG MT LOZG: 1.0000 | LOZENGE | OROMUCOSAL | Status: DC | PRN

## 2013-08-29 MED ORDER — HYDROCODONE-ACETAMINOPHEN 5-325 MG PO TABS: 1.0000 | ORAL_TABLET | Freq: Four times a day (QID) | ORAL | Status: DC | PRN

## 2013-08-29 MED ORDER — PHENOL 1.4 % MT LIQD
1.0000 | OROMUCOSAL | Status: DC | PRN
  Filled 2013-08-29: qty 177

## 2013-08-29 MED ORDER — LACTATED RINGERS IV SOLN
INTRAVENOUS | Status: DC
  Administered 2013-08-29: 16:00:00 via INTRAVENOUS

## 2013-08-29 MED ORDER — DEXTROSE 5 % IV SOLN
10.0000 mg | INTRAVENOUS | Status: DC | PRN
  Administered 2013-08-29: 20 ug/min via INTRAVENOUS

## 2013-08-29 MED ORDER — OXYCODONE HCL 5 MG PO TABS
5.0000 mg | ORAL_TABLET | ORAL | Status: DC | PRN
  Administered 2013-08-30: 5 mg via ORAL
  Administered 2013-08-30 – 2013-08-31 (×3): 10 mg via ORAL
  Administered 2013-09-01: 5 mg via ORAL
  Administered 2013-09-02: 10 mg via ORAL
  Filled 2013-08-29: qty 2
  Filled 2013-08-29: qty 1
  Filled 2013-08-29 (×3): qty 2
  Filled 2013-08-29: qty 1
  Filled 2013-08-29: qty 2

## 2013-08-29 MED ORDER — ONDANSETRON HCL 4 MG/2ML IJ SOLN
INTRAMUSCULAR | Status: AC
  Filled 2013-08-29: qty 2

## 2013-08-29 MED ORDER — ACETAMINOPHEN 325 MG PO TABS
650.0000 mg | ORAL_TABLET | Freq: Four times a day (QID) | ORAL | Status: DC | PRN
  Administered 2013-08-30 – 2013-08-31 (×3): 650 mg via ORAL
  Filled 2013-08-29 (×2): qty 2

## 2013-08-29 MED ORDER — PROPOFOL 10 MG/ML IV BOLUS
INTRAVENOUS | Status: DC | PRN
  Administered 2013-08-29: 50 mg via INTRAVENOUS

## 2013-08-29 MED ORDER — CHOLECALCIFEROL 25 MCG (1000 UT) PO CAPS: 2000.0000 [IU] | ORAL_CAPSULE | Freq: Every day | ORAL | Status: DC

## 2013-08-29 MED ORDER — ONDANSETRON HCL 4 MG/2ML IJ SOLN
4.0000 mg | Freq: Four times a day (QID) | INTRAMUSCULAR | Status: DC | PRN
Start: 2013-08-29 — End: 2013-08-29

## 2013-08-29 MED ORDER — MORPHINE SULFATE 2 MG/ML IJ SOLN
1.0000 mg | INTRAMUSCULAR | Status: DC | PRN
  Administered 2013-08-29: 2 mg via INTRAVENOUS
  Filled 2013-08-29: qty 1

## 2013-08-29 MED ORDER — HYDROMORPHONE HCL PF 1 MG/ML IJ SOLN
0.2500 mg | INTRAMUSCULAR | Status: DC | PRN
  Administered 2013-08-29 (×4): 0.25 mg via INTRAVENOUS

## 2013-08-29 MED ORDER — METOCLOPRAMIDE HCL 5 MG/ML IJ SOLN: 5.0000 mg | Freq: Three times a day (TID) | INTRAMUSCULAR | Status: DC | PRN

## 2013-08-29 MED ORDER — NEOSTIGMINE METHYLSULFATE 1 MG/ML IJ SOLN
INTRAMUSCULAR | Status: DC | PRN
Start: 2013-08-29 — End: 2013-08-29
  Administered 2013-08-29: 3 mg via INTRAVENOUS

## 2013-08-29 MED ORDER — LORATADINE 10 MG PO TABS
10.0000 mg | ORAL_TABLET | Freq: Every day | ORAL | Status: DC
  Administered 2013-08-30 – 2013-09-02 (×4): 10 mg via ORAL
  Filled 2013-08-29 (×4): qty 1

## 2013-08-29 MED ORDER — HYDROMORPHONE HCL PF 1 MG/ML IJ SOLN
0.5000 mg | INTRAMUSCULAR | Status: DC | PRN
  Administered 2013-08-29 – 2013-08-30 (×2): 0.5 mg via INTRAVENOUS
  Filled 2013-08-29 (×2): qty 1

## 2013-08-29 MED ORDER — ROCURONIUM BROMIDE 100 MG/10ML IV SOLN
INTRAVENOUS | Status: DC | PRN
  Administered 2013-08-29: 25 mg via INTRAVENOUS

## 2013-08-29 MED ORDER — HYDRALAZINE HCL 25 MG PO TABS
25.0000 mg | ORAL_TABLET | Freq: Three times a day (TID) | ORAL | Status: DC
  Administered 2013-08-30 – 2013-09-02 (×4): 25 mg via ORAL
  Filled 2013-08-29 (×12): qty 1

## 2013-08-29 MED ORDER — FENTANYL CITRATE 0.05 MG/ML IJ SOLN
INTRAMUSCULAR | Status: DC | PRN
  Administered 2013-08-29 (×2): 50 ug via INTRAVENOUS

## 2013-08-29 MED ORDER — ONDANSETRON HCL 4 MG PO TABS: 4.0000 mg | ORAL_TABLET | Freq: Four times a day (QID) | ORAL | Status: DC | PRN

## 2013-08-29 MED ORDER — ONDANSETRON HCL 4 MG/2ML IJ SOLN
INTRAMUSCULAR | Status: DC | PRN
  Administered 2013-08-29: 4 mg via INTRAVENOUS

## 2013-08-29 MED ORDER — PHENYLEPHRINE HCL 10 MG/ML IJ SOLN
INTRAMUSCULAR | Status: AC
  Filled 2013-08-29: qty 1

## 2013-08-29 MED ORDER — HYDROMORPHONE HCL PF 1 MG/ML IJ SOLN
INTRAMUSCULAR | Status: AC
  Administered 2013-08-29: 0.25 mg via INTRAVENOUS
  Filled 2013-08-29: qty 1

## 2013-08-29 MED ORDER — TRAMADOL HCL 50 MG PO TABS
100.0000 mg | ORAL_TABLET | Freq: Three times a day (TID) | ORAL | Status: DC
  Administered 2013-08-29 – 2013-09-02 (×11): 100 mg via ORAL
  Filled 2013-08-29 (×11): qty 2

## 2013-08-29 MED ORDER — PHENYLEPHRINE HCL 10 MG/ML IJ SOLN
INTRAMUSCULAR | Status: DC | PRN
  Administered 2013-08-29: 120 ug via INTRAVENOUS

## 2013-08-29 MED ORDER — HYDRALAZINE HCL 25 MG PO TABS
25.0000 mg | ORAL_TABLET | Freq: Three times a day (TID) | ORAL | Status: DC
  Filled 2013-08-29: qty 1

## 2013-08-29 MED ORDER — GLYCOPYRROLATE 0.2 MG/ML IJ SOLN
INTRAMUSCULAR | Status: DC | PRN
  Administered 2013-08-29: 0.4 mg via INTRAVENOUS

## 2013-08-29 MED ORDER — ARTIFICIAL TEARS OP OINT
TOPICAL_OINTMENT | OPHTHALMIC | Status: AC
  Filled 2013-08-29: qty 3.5

## 2013-08-29 MED ORDER — VANCOMYCIN HCL IN DEXTROSE 1-5 GM/200ML-% IV SOLN
1000.0000 mg | Freq: Two times a day (BID) | INTRAVENOUS | Status: AC
  Administered 2013-08-29: 1000 mg via INTRAVENOUS
  Filled 2013-08-29: qty 200

## 2013-08-29 MED ORDER — ONDANSETRON HCL 4 MG/2ML IJ SOLN: 4.0000 mg | Freq: Four times a day (QID) | INTRAMUSCULAR | Status: DC | PRN

## 2013-08-29 MED ORDER — GLYCOPYRROLATE 0.2 MG/ML IJ SOLN
INTRAMUSCULAR | Status: AC
  Filled 2013-08-29: qty 2

## 2013-08-29 MED ORDER — LACTATED RINGERS IV SOLN
INTRAVENOUS | Status: DC | PRN
  Administered 2013-08-29: 16:00:00 via INTRAVENOUS

## 2013-08-29 MED ORDER — VITAMIN E 180 MG (400 UNIT) PO CAPS
400.0000 [IU] | ORAL_CAPSULE | Freq: Every day | ORAL | Status: DC
  Administered 2013-08-30 – 2013-09-02 (×4): 400 [IU] via ORAL
  Filled 2013-08-29 (×4): qty 1

## 2013-08-29 MED ORDER — LIDOCAINE HCL (CARDIAC) 20 MG/ML IV SOLN
INTRAVENOUS | Status: AC
  Filled 2013-08-29: qty 5

## 2013-08-29 MED ORDER — MIDAZOLAM HCL 2 MG/2ML IJ SOLN
INTRAMUSCULAR | Status: AC
  Filled 2013-08-29: qty 2

## 2013-08-29 MED ORDER — ACETAMINOPHEN 325 MG PO TABS
650.0000 mg | ORAL_TABLET | Freq: Four times a day (QID) | ORAL | Status: DC | PRN
  Filled 2013-08-29 (×2): qty 2

## 2013-08-29 MED ORDER — FENTANYL CITRATE 0.05 MG/ML IJ SOLN
INTRAMUSCULAR | Status: AC
  Filled 2013-08-29: qty 5

## 2013-08-29 SURGICAL SUPPLY — 36 items
BANDAGE GAUZE ELAST BULKY 4 IN (GAUZE/BANDAGES/DRESSINGS) ×3 IMPLANT
BNDG COHESIVE 4X5 TAN STRL (GAUZE/BANDAGES/DRESSINGS) ×3 IMPLANT
CHLORAPREP W/TINT 26ML (MISCELLANEOUS) ×3 IMPLANT
CLOTH BEACON ORANGE TIMEOUT ST (SAFETY) ×3 IMPLANT
COVER SURGICAL LIGHT HANDLE (MISCELLANEOUS) ×3 IMPLANT
DRAPE INCISE IOBAN 66X45 STRL (DRAPES) ×3 IMPLANT
DRAPE STERI IOBAN 125X83 (DRAPES) ×3 IMPLANT
DRAPE SURG 17X23 STRL (DRAPES) ×12 IMPLANT
DRSG MEPILEX BORDER 4X4 (GAUZE/BANDAGES/DRESSINGS) ×5 IMPLANT
DRSG PAD ABDOMINAL 8X10 ST (GAUZE/BANDAGES/DRESSINGS) ×3 IMPLANT
ELECT REM PT RETURN 9FT ADLT (ELECTROSURGICAL) ×3
ELECTRODE REM PT RTRN 9FT ADLT (ELECTROSURGICAL) ×1 IMPLANT
GLOVE BIO SURGEON STRL SZ7 (GLOVE) ×3 IMPLANT
GLOVE BIO SURGEON STRL SZ7.5 (GLOVE) ×3 IMPLANT
GLOVE BIOGEL PI IND STRL 8 (GLOVE) ×1 IMPLANT
GLOVE BIOGEL PI INDICATOR 8 (GLOVE) ×2
GOWN PREVENTION PLUS LG XLONG (DISPOSABLE) ×3 IMPLANT
GOWN STRL NON-REIN LRG LVL3 (GOWN DISPOSABLE) ×3 IMPLANT
GUIDEPIN 3.2X17.5 THRD DISP (PIN) ×2 IMPLANT
GUIDEWIRE BALL NOSE 100CM (WIRE) ×2 IMPLANT
HFN RH 130 DEG 11MM X 360MM (Orthopedic Implant) ×2 IMPLANT
HIP FRA NAIL LAG SCREW 10.5X90 (Orthopedic Implant) ×3 IMPLANT
KIT ROOM TURNOVER OR (KITS) ×3 IMPLANT
MANIFOLD NEPTUNE II (INSTRUMENTS) ×3 IMPLANT
NS IRRIG 1000ML POUR BTL (IV SOLUTION) ×3 IMPLANT
PACK GENERAL/GYN (CUSTOM PROCEDURE TRAY) ×3 IMPLANT
PAD ARMBOARD 7.5X6 YLW CONV (MISCELLANEOUS) ×6 IMPLANT
SCREW LAG HIP FRA NAIL 10.5X90 (Orthopedic Implant) IMPLANT
STAPLER VISISTAT 35W (STAPLE) ×3 IMPLANT
SUT VIC AB 1 CT1 27 (SUTURE) ×3
SUT VIC AB 1 CT1 27XBRD ANBCTR (SUTURE) ×1 IMPLANT
SUT VIC AB 2-0 CT1 27 (SUTURE) ×3
SUT VIC AB 2-0 CT1 TAPERPNT 27 (SUTURE) ×1 IMPLANT
TOWEL OR 17X24 6PK STRL BLUE (TOWEL DISPOSABLE) ×3 IMPLANT
TOWEL OR 17X26 10 PK STRL BLUE (TOWEL DISPOSABLE) ×3 IMPLANT
WATER STERILE IRR 1000ML POUR (IV SOLUTION) ×3 IMPLANT

## 2013-08-29 NOTE — ED Notes (Signed)
Call radiology, the pt is in the transport system so they should be coming for her shortly. Spoke with PA, she is ok with [place] waiting to cath pt, if hip is broken will be a foley cath in, if not just do in and out cath. Family updated on plan.

## 2013-08-29 NOTE — ED Notes (Signed)
Dr. Chandler at bedside.  

## 2013-08-29 NOTE — ED Provider Notes (Signed)
Medical screening examination/treatment/procedure(s) were performed by non-physician practitioner and as supervising physician I was immediately available for consultation/collaboration.      Elmer Sow, MD 08/29/13 [phone]

## 2013-08-29 NOTE — ED Notes (Signed)
Pt's daughter at bedside, reports she was admitted to [facility] for + C.diff and rehab from that, then possible move for assisted living. Reports after the fall pt continued to c/o of right hip pain so they wanted to have her further evaluated.

## 2013-08-29 NOTE — Preoperative (Signed)
Beta Blockers   Reason not to administer Beta Blockers:Not Applicable 

## 2013-08-29 NOTE — Consult Note (Signed)
Reason for Consult: Evaulate right hip fracture Referring Physician: Cleatrice Burke PA-C  Meredith Bender is an 80 y.o. female.  HPI: 80 year old female who was recently discharged from the hospital to a skilled nursing facility after C. difficile. She was improving, however she fell last evening trying to get the toilet. She has been unable to bear weight since that time. She complains of right hip pain which is moderate to severe, worse with movement or weightbearing, better with rest and pain medication. Denies other injuries with the fall.  Past Medical History  Diagnosis Date  . Hypertension   . CVA (cerebral infarction)     Remote bilateral cerebellar infarcts on CT 8/14  . Dyslipidemia 02/28/2013  . Anginal pain   . Chronic kidney disease   . Arthritis   . COPD (chronic obstructive pulmonary disease)   . Stroke 1978    no deficits    Past Surgical History  Procedure Laterality Date  . Hernia repair    . Appendectomy    . Tubal ligation    . Abdominal hysterectomy      No family history on file.  Social History:  reports that she has never smoked. She does not have any smokeless tobacco history on file. She reports that she does not drink alcohol or use illicit drugs.  Allergies:  Allergies  Allergen Reactions  . Codeine Nausea Only  . Penicillins Rash    Pt does not remember how long ago or severity of reaction.     Medications: Prior to Admission:  (Not in a hospital admission)  Results for orders placed during the hospital encounter of 08/29/13 (from the past 48 hour(s))  CBC WITH DIFFERENTIAL     Status: Abnormal   Collection Time    08/29/13 11:33 AM      Result Value Ref Range   WBC 10.1  4.0 - 10.5 K/uL   RBC 3.89  3.87 - 5.11 MIL/uL   Hemoglobin 11.1 (*) 12.0 - 15.0 g/dL   HCT 33.9 (*) 36.0 - 46.0 %   MCV 87.1  78.0 - 100.0 fL   MCH 28.5  26.0 - 34.0 pg   MCHC 32.7  30.0 - 36.0 g/dL   RDW 14.5  11.5 - 15.5 %   Platelets 275  150 - 400 K/uL   Neutrophils Relative % 84 (*) 43 - 77 %   Neutro Abs 8.5 (*) 1.7 - 7.7 K/uL   Lymphocytes Relative 7 (*) 12 - 46 %   Lymphs Abs 0.7  0.7 - 4.0 K/uL   Monocytes Relative 8  3 - 12 %   Monocytes Absolute 0.8  0.1 - 1.0 K/uL   Eosinophils Relative 1  0 - 5 %   Eosinophils Absolute 0.1  0.0 - 0.7 K/uL   Basophils Relative 0  0 - 1 %   Basophils Absolute 0.0  0.0 - 0.1 K/uL  PROTIME-INR     Status: None   Collection Time    08/29/13 11:33 AM      Result Value Ref Range   Prothrombin Time 13.1  11.6 - 15.2 seconds   INR 1.01  0.00 - 1.49  TYPE AND SCREEN     Status: None   Collection Time    08/29/13 11:33 AM      Result Value Ref Range   ABO/RH(D) O POS     Antibody Screen POS     Sample Expiration 09/01/2013     DAT, IgG NEG  POCT I-STAT TROPONIN I     Status: None   Collection Time    08/29/13 11:38 AM      Result Value Ref Range   Troponin i, poc 0.01  0.00 - 0.08 ng/mL   Comment 3            Comment: Due to the release kinetics of cTnI,     a negative result within the first hours     of the onset of symptoms does not rule out     myocardial infarction with certainty.     If myocardial infarction is still suspected,     repeat the test at appropriate intervals.  URINALYSIS, ROUTINE W REFLEX MICROSCOPIC     Status: Abnormal   Collection Time    08/29/13  1:03 PM      Result Value Ref Range   Color, Urine YELLOW  YELLOW   APPearance CLEAR  CLEAR   Specific Gravity, Urine 1.014  1.005 - 1.030   pH 6.5  5.0 - 8.0   Glucose, UA NEGATIVE  NEGATIVE mg/dL   Hgb urine dipstick LARGE (*) NEGATIVE   Bilirubin Urine NEGATIVE  NEGATIVE   Ketones, ur NEGATIVE  NEGATIVE mg/dL   Protein, ur 30 (*) NEGATIVE mg/dL   Urobilinogen, UA 0.2  0.0 - 1.0 mg/dL   Nitrite NEGATIVE  NEGATIVE   Leukocytes, UA NEGATIVE  NEGATIVE  URINE MICROSCOPIC-ADD ON     Status: None   Collection Time    08/29/13  1:03 PM      Result Value Ref Range   WBC, UA 0-2  <3 WBC/hpf   RBC / HPF TOO NUMEROUS  TO COUNT  <3 RBC/hpf   Bacteria, UA RARE  RARE    Dg Hip Complete Right  08/29/2013   CLINICAL DATA:  [place] in bathroom one day ago with right hip pain  EXAM: RIGHT HIP - COMPLETE 2+ VIEW  COMPARISON:  None.  FINDINGS: There is a nondisplaced intertrochanteric right femur fracture with no angulation. There are no other acute abnormalities.  IMPRESSION: Nondisplaced intertrochanteric fracture   Electronically Signed   By: Skipper Cliche M.D.   On: 08/29/2013 12:32    Review of Systems  Unable to perform ROS: acuity of condition  All other systems reviewed and are negative.   Blood pressure 146/63, pulse 78, temperature 98.1 F (36.7 C), temperature source Oral, resp. rate 18, SpO2 91.00%. Physical Exam  Constitutional: She is oriented to person, place, and time. She appears well-developed and well-nourished.  HENT:  Head: Atraumatic.  Eyes: EOM are normal.  Cardiovascular: Intact distal pulses.   Respiratory: Effort normal.  Musculoskeletal:  R hip TTP.  Distally NVID.  Neurological: She is alert and oriented to person, place, and time.  Skin: Skin is warm and dry.  Psychiatric: She has a normal mood and affect.    Assessment/Plan: Right intertrochanteric femur fracture Recommend surgical management to allow early mobilization and prevent displacement. I spoke at length with the patient and her son regarding the surgery. They both agree and would like  to go forward with surgery. She will be admitted to the hospitalist service. N.p.o. for surgery today.  Meredith Bender 08/29/2013, 1:54 PM

## 2013-08-29 NOTE — Anesthesia Postprocedure Evaluation (Signed)
  Anesthesia Post-op Note  Patient: Meredith Bender  Procedure(s) Performed: Procedure(s): INTRAMEDULLARY (IM) NAIL INTERTROCHANTRIC (Right)  Patient Location: PACU  Anesthesia Type:General  Level of Consciousness: awake, oriented, sedated and patient cooperative  Airway and Oxygen Therapy: Patient Spontanous Breathing  Post-op Pain: mild  Post-op Assessment: Post-op Vital signs reviewed, Patient's Cardiovascular Status Stable, Respiratory Function Stable, Patent Airway, No signs of Nausea or vomiting and Pain level controlled  Post-op Vital Signs: stable  Complications: No apparent anesthesia complications

## 2013-08-29 NOTE — ED Notes (Signed)
Family at bedside, brought in extra chair for them to sit down.

## 2013-08-29 NOTE — Progress Notes (Signed)
[facility] History and Physical  Meredith Bender DDU:[id number] DOB: [birth year] DOA: 08/29/2013  Referring physician: EDP PCP: Meredith Mcmurray, MD   Chief Complaint: Right hip pain, status post fall  HPI: Meredith Bender is a 80 y.o. female history of hypertension, CVA, dyslipidemia CKD stage IV, COPD recently(07/2013) hospitalized with C. difficile and discharged to SNF/[facility] who presents with above complaints. She states in last p.m. she went to the bathroom and was about to sit on the toilet but missed it and fell on her right side. She reports that she began having CVA hip pain there after. She denies dizziness, chest pain, loss of consciousness, and no focal weakness prior to this episode. She was seen in the ED and an x-ray showed a nondisplaced intertrochanteric fracture, chest x-ray showed no acute findings. Orthopedics was consulted and admission to medicine service requested. Troponin done in the ED was negative, EKG showed normal sinus rhythm at 78 with no acute ischemic changes. she denies shortness of breath, cough and no chest pain at this time.    Review of Systems The patient denies anorexia, fever, weight loss, vision loss, decreased hearing, hoarseness, chest pain, syncope, dyspnea on exertion, peripheral edema, hemoptysis, abdominal pain, melena, hematochezia, severe indigestion/heartburn, hematuria, incontinence, genital sores, muscle weakness, suspicious skin lesions, transient blindness, difficulty walking, depression, unusual weight change, abnormal bleeding,   Past Medical History  Diagnosis Date  . Hypertension   . CVA (cerebral infarction)     Remote bilateral cerebellar infarcts on CT 8/14  . Dyslipidemia 02/28/2013  . Anginal pain   . Chronic kidney disease   . Arthritis   . COPD (chronic obstructive pulmonary disease)   . Stroke 1978    no deficits   Past Surgical History  Procedure Laterality Date  . Hernia repair    . Appendectomy    . Tubal ligation     . Abdominal hysterectomy     Social History:  reports that she has never smoked. She does not have any smokeless tobacco history on file. She reports that she does not drink alcohol or use illicit drugs.  Allergies  Allergen Reactions  . Codeine Nausea Only  . Penicillins Rash    Pt does not remember how long ago or severity of reaction.     No family history on file.   Prior to Admission medications   Medication Sig Start Date End Date Taking? Authorizing Provider  atorvastatin (LIPITOR) 10 MG tablet Take 10 mg by mouth daily.     Yes Historical Provider, MD  Cholecalciferol (D3-1000) 1000 UNITS capsule Take 2,000 Units by mouth daily.   Yes Historical Provider, MD  hydrALAZINE (APRESOLINE) 25 MG tablet Take 25 mg by mouth 3 (three) times daily.   Yes Historical Provider, MD  loratadine (CLARITIN) 10 MG tablet Take 10 mg by mouth daily.     Yes Historical Provider, MD  omeprazole (PRILOSEC) 20 MG capsule Take 20 mg by mouth daily.     Yes Historical Provider, MD  pravastatin (PRAVACHOL) 40 MG tablet Take 40 mg by mouth daily.   Yes Historical Provider, MD  promethazine (PHENERGAN) 25 MG tablet Take 25 mg by mouth every 6 (six) hours as needed for nausea or vomiting.   Yes Historical Provider, MD  traMADol (ULTRAM) 50 MG tablet Take 100 mg by mouth 3 (three) times daily.    Yes Historical Provider, MD  verapamil (VERELAN PM) 240 MG 24 hr capsule Take 1 capsule (240 mg total) by mouth at  bedtime. 06/12/13  Yes [name], MD  vitamin E (VITAMIN E) 400 UNIT capsule Take 400 Units by mouth daily.     Yes Historical Provider, MD   Physical Exam: Filed Vitals:   08/29/13 1500  BP: 159/52  Pulse: 73  Temp: 97.6 F (36.4 C)  Resp: 20    BP 159/52  Pulse 73  Temp(Src) 97.6 F (36.4 C) (Oral)  Resp 20  SpO2 92% Constitutional: Vital signs reviewed.  Patient is a well-developed and well-nourished  in no acute distress and cooperative with exam. Alert and oriented x3.  Head:  Normocephalic and atraumatic Mouth: no erythema or exudates, MMM Eyes: PERRL, EOMI, conjunctivae normal, No scleral icterus.  Neck: Supple, Trachea midline normal ROM, No JVD, mass, thyromegaly, or carotid bruit present.  Cardiovascular: RRR, S1 normal, S2 normal, no MRG, pulses symmetric and intact bilaterally Pulmonary/Chest: normal respiratory effort, moderate air movement, no wheezes, rales, or rhonchi Abdominal: Soft. Non-tender, non-distended, bowel sounds are normal, no masses, organomegaly, or guarding present.  GU: no CVA tenderness Extremities: No cyanosis and no edema  Neurological: A&O x3, Strength is normal and symmetric bilaterally, cranial nerve II-XII are grossly intact, no focal motor deficit, sensory intact to light touch bilaterally.  Skin: Warm, dry and intact. No rash, cyanosis, or clubbing.  Psychiatric: Normal mood and affect. speech and behavior is normal.               Labs on Admission:  Basic Metabolic Panel:  Recent Labs Lab 08/29/13 1133 08/29/13 1351  NA 141 142  K 4.3 4.3  CL 104 105  CO2 24  --   GLUCOSE 105* 104*  BUN 16 15  CREATININE 1.62* 1.80*  CALCIUM 9.3  --    Liver Function Tests: No results found for this basename: AST, ALT, ALKPHOS, BILITOT, PROT, ALBUMIN,  in the last 168 hours No results found for this basename: LIPASE, AMYLASE,  in the last 168 hours No results found for this basename: AMMONIA,  in the last 168 hours CBC:  Recent Labs Lab 08/29/13 1133 08/29/13 1351  WBC 10.1  --   NEUTROABS 8.5*  --   HGB 11.1* 11.6*  HCT 33.9* 34.0*  MCV 87.1  --   PLT 275  --    Cardiac Enzymes: No results found for this basename: CKTOTAL, CKMB, CKMBINDEX, TROPONINI,  in the last 168 hours  BNP (last 3 results) No results found for this basename: PROBNP,  in the last 8760 hours CBG: No results found for this basename: GLUCAP,  in the last 168 hours  Radiological Exams on Admission: Dg Chest 1 View  08/29/2013   CLINICAL  DATA:  Hip fracture.  EXAM: CHEST - 1 VIEW  COMPARISON:  PA and lateral chest 02/28/2013.  FINDINGS: There is marked cardiomegaly but no edema. Lungs are emphysematous but clear. No pneumothorax or pleural effusion is identified. Remote healed right clavicle fracture is noted.  IMPRESSION: No acute disease.  Cardiomegaly without edema.  Pulmonary hyperexpansion compatible with emphysema.   Electronically Signed   By: Inge Rise M.D.   On: 08/29/2013 14:26   Dg Hip Complete Right  08/29/2013   CLINICAL DATA:  [place] [address] in bathroom one day ago with right hip pain  EXAM: RIGHT HIP - COMPLETE 2+ VIEW  COMPARISON:  None.  FINDINGS: There is a nondisplaced intertrochanteric right femur fracture with no angulation. There are no other acute abnormalities.  IMPRESSION: Nondisplaced intertrochanteric fracture   Electronically Signed   By:  Skipper Cliche M.D.   On: 08/29/2013 12:32     Assessment/Plan Active Problems:  Present on Admission:  Present on Admission:  . right Hip fracture -As discussed above, hip x-rays with nondisplaced intertrochanteric fracture  -Orthopedics consulted per EDP and patient taken to surgery today per Dr. Tamera Punt  -Start on IV analgesics for Pain management  . CKD (chronic kidney disease) stage 4 -Stable, creatinine 1.8 today (from 1.6 on 07/25/13, her baseline reported to be around 2) -Followed by Dr. Posey Pronto  . Hypertension -Continue outpatient medications  . Dyslipidemia -Continue outpatient medications . History of COPD -Stable, oxygenating well on room air. Place on When necessary bronchodilators . History of incidental 3 cm ovarian cyst/mass -[facility]  [facility] on 1/21       Code Status: full Family Communication: none at bedside Disposition Plan: admit to med surge  Time spent: >30  [facility] Pager [phone]

## 2013-08-29 NOTE — ED Notes (Signed)
phlebotomy at bedside.  

## 2013-08-29 NOTE — Progress Notes (Signed)
Orthopedic Tech Progress Note Patient Details:  Meredith Bender [birth year] [id number]  Ortho Devices Ortho Device/Splint Location: put ohf on bed  Ortho Device/Splint Interventions: Ordered;Application   Braulio Bosch 08/29/2013, 10:20 PM

## 2013-08-29 NOTE — Anesthesia Procedure Notes (Signed)
Procedure Name: Intubation Date/Time: 08/29/2013 5:08 PM Performed by: Wanita Chamberlain Pre-anesthesia Checklist: Patient identified, Timeout performed, Emergency Drugs available, Suction available and Patient being monitored Patient Re-evaluated:Patient Re-evaluated prior to inductionOxygen Delivery Method: Circle system utilized Preoxygenation: Pre-oxygenation with 100% oxygen Intubation Type: IV induction Ventilation: Mask ventilation without difficulty Laryngoscope Size: Mac and 3 Grade View: Grade I Tube size: 7.5 mm Number of attempts: 1 Airway Equipment and Method: Stylet Placement Confirmation: ETT inserted through vocal cords under direct vision,  positive ETCO2 and breath sounds checked- equal and bilateral Secured at: 21 cm Tube secured with: Tape Dental Injury: Teeth and Oropharynx as per pre-operative assessment

## 2013-08-29 NOTE — ED Provider Notes (Signed)
CSN: [id number]     Arrival date & time 08/29/13  1025 History   First MD Initiated Contact with Patient 08/29/13 1032     Chief Complaint  Patient presents with  . Fall  . Hip Pain     (Consider location/radiation/quality/duration/timing/severity/associated sxs/prior Treatment) HPI Comments: Patient is an 80 year old female history of hypertension, CVA, dyslipidemia, chronic kidney disease, COPD who presents today after a fall at 11:30 last night. She is currently staying at [place] home. Around 11:30 she had an unwitnessed fall. They report that she was going to sit on the toilet, but missed the toilet. She landed on her right side. They report that she did not hit her head, lose consciousness. Patient is alert and oriented and confirms that. Currently she complains of severe right hip pain that radiates into her right leg. An x-ray was done at the nursing home which showed no fracture. 100 mcg of fentanyl did not improved her pain. She does have history of chronic urinary tract infections. Her son reports that she has been doing very well over this past week. He feels as though she is then improving. No fevers, chills, nausea, vomiting. Patient is eating and drinking normally.  The history is provided by the patient. No language interpreter was used.    Past Medical History  Diagnosis Date  . Hypertension   . CVA (cerebral infarction)     Remote bilateral cerebellar infarcts on CT 8/14  . Dyslipidemia 02/28/2013  . Anginal pain   . Chronic kidney disease   . Arthritis   . COPD (chronic obstructive pulmonary disease)   . Stroke 1978    no deficits   Past Surgical History  Procedure Laterality Date  . Hernia repair    . Appendectomy    . Tubal ligation    . Abdominal hysterectomy     No family history on file. History  Substance Use Topics  . Smoking status: Never Smoker   . Smokeless tobacco: Not on file  . Alcohol Use: No   OB History   Grav Para Term  Preterm Abortions TAB SAB Ect Mult Living                 Review of Systems  Constitutional: Negative for fever, chills and fatigue.  Respiratory: Negative for shortness of breath.   Cardiovascular: Negative for chest pain.  Gastrointestinal: Negative for nausea, vomiting and abdominal pain.  Musculoskeletal: Positive for arthralgias, gait problem and myalgias.  All other systems reviewed and are negative.      Allergies  Codeine and Penicillins  Home Medications   Current Outpatient Rx  Name  Route  Sig  Dispense  Refill  . atorvastatin (LIPITOR) 10 MG tablet   Oral   Take 10 mg by mouth daily.           . Cholecalciferol (D3-1000) 1000 UNITS capsule   Oral   Take 2,000 Units by mouth daily.         . hydrALAZINE (APRESOLINE) 25 MG tablet   Oral   Take 25 mg by mouth 3 (three) times daily.         Marland Kitchen loratadine (CLARITIN) 10 MG tablet   Oral   Take 10 mg by mouth daily.           Marland Kitchen omeprazole (PRILOSEC) 20 MG capsule   Oral   Take 20 mg by mouth daily.           . pravastatin (PRAVACHOL) 40 MG  tablet   Oral   Take 40 mg by mouth daily.         . promethazine (PHENERGAN) 25 MG tablet   Oral   Take 25 mg by mouth every 6 (six) hours as needed for nausea or vomiting.         . traMADol (ULTRAM) 50 MG tablet   Oral   Take 100 mg by mouth 3 (three) times daily.          . verapamil (VERELAN PM) 240 MG 24 hr capsule   Oral   Take 1 capsule (240 mg total) by mouth at bedtime.         . vitamin E (VITAMIN E) 400 UNIT capsule   Oral   Take 400 Units by mouth daily.            BP 168/61  Pulse 79  Temp(Src) 98.1 F (36.7 C) (Oral)  Resp 20  SpO2 92% Physical Exam  Nursing note and vitals reviewed. Constitutional: She is oriented to person, place, and time. She appears well-developed and well-nourished. No distress.  HENT:  Head: Normocephalic and atraumatic.  Right Ear: External ear normal.  Left Ear: External ear normal.  Nose:  Nose normal.  Mouth/Throat: Oropharynx is clear and moist.  Eyes: Conjunctivae are normal.  Neck: Normal range of motion.  Cardiovascular: Normal rate, regular rhythm, normal heart sounds, intact distal pulses and normal pulses.   Pulses:      Dorsalis pedis pulses are 2+ on the right side, and 2+ on the left side.  Capillary refill less than 3 seconds in all toes.  Pulmonary/Chest: Effort normal and breath sounds normal. No stridor. No respiratory distress. She has no wheezes. She has no rales.  Abdominal: Soft. She exhibits no distension.  Musculoskeletal: Normal range of motion.  Tenderness to palpation diffusely over right hip. There is bruising. No deformity. Compartment soft, neurovascularly intact. Sensation intact.  Neurological: She is alert and oriented to person, place, and time. She has normal strength.  Skin: Skin is warm and dry. She is not diaphoretic. No erythema.  Psychiatric: She has a normal mood and affect. Her behavior is normal.    ED Course  Procedures (including critical care time) Labs Review Labs Reviewed  BASIC METABOLIC PANEL - Abnormal; Notable for the following:    Glucose, Bld 105 (*)    Creatinine, Ser 1.62 (*)    GFR calc non Af Amer 28 (*)    GFR calc Af Amer 32 (*)    All other components within normal limits  CBC WITH DIFFERENTIAL - Abnormal; Notable for the following:    Hemoglobin 11.1 (*)    HCT 33.9 (*)    Neutrophils Relative % 84 (*)    Neutro Abs 8.5 (*)    Lymphocytes Relative 7 (*)    All other components within normal limits  URINALYSIS, ROUTINE W REFLEX MICROSCOPIC - Abnormal; Notable for the following:    Hgb urine dipstick LARGE (*)    Protein, ur 30 (*)    All other components within normal limits  POCT I-STAT, CHEM 8 - Abnormal; Notable for the following:    Creatinine, Ser 1.80 (*)    Glucose, Bld 104 (*)    Hemoglobin 11.6 (*)    HCT 34.0 (*)    All other components within normal limits  URINE CULTURE  PROTIME-INR   URINE MICROSCOPIC-ADD ON  POCT I-STAT TROPONIN I  TYPE AND SCREEN   Imaging Review Dg Chest 1  View  08/29/2013   CLINICAL DATA:  Hip fracture.  EXAM: CHEST - 1 VIEW  COMPARISON:  PA and lateral chest 02/28/2013.  FINDINGS: There is marked cardiomegaly but no edema. Lungs are emphysematous but clear. No pneumothorax or pleural effusion is identified. Remote healed right clavicle fracture is noted.  IMPRESSION: No acute disease.  Cardiomegaly without edema.  Pulmonary hyperexpansion compatible with emphysema.   Electronically Signed   By: Inge Rise M.D.   On: 08/29/2013 14:26   Dg Hip Complete Right  08/29/2013   CLINICAL DATA:  [address] in bathroom one day ago with right hip pain  EXAM: RIGHT HIP - COMPLETE 2+ VIEW  COMPARISON:  None.  FINDINGS: There is a nondisplaced intertrochanteric right femur fracture with no angulation. There are no other acute abnormalities.  IMPRESSION: Nondisplaced intertrochanteric fracture   Electronically Signed   By: Skipper Cliche M.D.   On: 08/29/2013 12:32      MDM   Final diagnoses:  None   Patient is an 80 year old female who presents to the emergency department for a right hip fracture. Discussed case with Dr. Tamera Punt who plans to do surgery this afternoon. I will continue to keep patient n.p.o. Patient will be admitted to medicine. Vital signs stable. Patient has no other complaints. Admission appreciated.     Elwyn Lade, PA-C 08/29/13 1530

## 2013-08-29 NOTE — Op Note (Signed)
Procedure(s): INTRAMEDULLARY (IM) NAIL INTERTROCHANTRIC Procedure Note  Meredith Bender female 80 y.o. 08/29/2013  Procedure(s) and Anesthesia Type:    *RIGHT  INTRAMEDULLARY (IM) NAIL INTERTROCHANTRIC - General  Surgeon(s) and Role:    * Nita Sells, MD - Primary   Indications:  80 y.o. female s/p fall with right hip fracture. Indicated for surgery to promote early ambulation, pain control and prevent complications of bed rest.     Surgeon: Nita Sells   Assistants: Jeanmarie Hubert PA-C (Danielle was present and scrubbed throughout the procedure and was essential in positioning, retraction, exposure, and closure)  Anesthesia: General endotracheal anesthesia    Procedure Detail  INTRAMEDULLARY (IM) NAIL INTERTROCHANTRIC  Findings: Biomet trochanteric in treating nail, 11 x 360 mm, 90 mm lag screw. no distal interlocking screw.  Estimated Blood Loss:  less than 100 mL         Drains: none  Blood Given: none          Specimens: none        Complications:  * No complications entered in OR log *         Disposition: PACU - hemodynamically stable.         Condition: stable    Procedure:  The patient was identified in the preoperative holding area  where I personally marked the operative site after verifying site, side,  and procedure with the patient. She was taken back to the operating  room where general anesthesia was induced without complication. She was  placed on the fracture table with the right lower extremity in traction,  and opposite lower extremity in a flexed abducted position. The arms were well  padded. Fluoroscopic imaging was used to verify reduction with gentle traction  and internal rotation. The right hip was then prepped and draped in the standard sterile fashion. An approximately 3 cm incision was made proximal  to the palpable greater trochanter tip. Dissection was carried down to  the tip and the short guidewire was  placed under fluoroscopic imaging.  The proximal entry reamer was used to open the canal and the ball-tipped  guidewire was then placed and advanced down the femoral canal. This was  used to obtain a measurement for the implant and then was subsequently  over reamed up to 12.5 mm. The 11 mm nail was then  advanced over the guidewire without difficulty and the proximal jig was  then placed and a small 1.5 cm incision was made on the lateral thigh to  advance the lag screw guide against the lateral aspect of the femur.  The guide pin was advanced and its position was verified in AP and  lateral planes to be centered in the head.  The guidewire was over  reamed and the appropriate size lag screw was advanced. The  proximal set screw was then advanced, backed off a quarter turn to allow  sliding. AP and lateral imaging demonstrated appropriate position of  the screw and reduction of the fracture. The proximal jig was then  removed. Final fluoroscopic imaging in AP and lateral planes at the hip and the  knee demonstrated  anatomic reduction with appropriate length and  position of the hardware. All wounds were then copiously irrigated with  normal saline and subsequently closed in layers with #1 Vicryl in a deep  fascia layer, 2-0 Vicryl in a deep dermal layer, and staples for skin  closure. Sterile dressings were then applied including 4x4s and Mepilex  dressings. The patient  was then taken off the fracture table,  transferred to the stretcher, and taken to the recovery room in stable  condition after she was extubated.   POSTOPERATIVE PLAN: She will be weightbearing as tolerated on the operative extremity. She will have DVT prophylaxis of aspirin twice daily and early ambulation with SCDs while in the hospital.

## 2013-08-29 NOTE — Anesthesia Preprocedure Evaluation (Addendum)
Anesthesia Evaluation  Patient identified by MRN, date of birth, ID band Patient awake    Reviewed: Allergy & Precautions  Airway Mallampati: II TM Distance: >3 FB Neck ROM: Limited  Mouth opening: Limited Mouth Opening  Dental  (+) Dental Advisory Given, Caps,    Pulmonary COPD   Pulmonary exam normal       Cardiovascular Exercise Tolerance: Poor hypertension, Pt. on medications - anginaRhythm:Regular Rate:Normal     Neuro/Psych CVA, No Residual Symptoms    GI/Hepatic GERD-  Medicated and Controlled,  Endo/Other  negative endocrine ROS  Renal/GU Renal InsufficiencyRenal disease     Musculoskeletal  (+) Arthritis -, Osteoarthritis,    Abdominal Normal abdominal exam  (+)   Peds  Hematology negative hematology ROS (+)   Anesthesia Other Findings   Reproductive/Obstetrics negative OB ROS                         Anesthesia Physical Anesthesia Plan  ASA: III  Anesthesia Plan: General   Post-op Pain Management:    Induction: Intravenous  Airway Management Planned: Oral ETT  Additional Equipment:   Intra-op Plan:   Post-operative Plan:   Informed Consent: I have reviewed the patients History and Physical, chart, labs and discussed the procedure including the risks, benefits and alternatives for the proposed anesthesia with the patient or authorized representative who has indicated his/her understanding and acceptance.   Dental advisory given  Plan Discussed with:   Anesthesia Plan Comments:         Anesthesia Quick Evaluation

## 2013-08-29 NOTE — ED Notes (Signed)
Pt returned from radiology.

## 2013-08-29 NOTE — Transfer of Care (Signed)
Immediate Anesthesia Transfer of Care Note  Patient: Meredith Bender  Procedure(s) Performed: Procedure(s): INTRAMEDULLARY (IM) NAIL INTERTROCHANTRIC (Right)  Patient Location: PACU  Anesthesia Type:General  Level of Consciousness: awake, sedated and patient cooperative  Airway & Oxygen Therapy: Patient Spontanous Breathing, Patient connected to nasal cannula oxygen and Patient connected to face mask oxygen  Post-op Assessment: Report given to PACU RN and Post -op Vital signs reviewed and stable  Post vital signs: Reviewed and stable  Complications: No apparent anesthesia complications

## 2013-08-29 NOTE — ED Notes (Signed)
Per EMS - pt coming from [facility] home. Pt fell last night around 1130, denies hitting head, fell while she was going to the bathroom. Denies use of blood thinners. Pt c/o pain to right hip. Portable xray was perform at nursing home, and showing negative for a fracture. Pt was unable to sleep due to the pain, staff at nursing home administered 1 5-325 mg of Percocet, 2 Ultram pills and 12.5 mg of PO phenergan. Upon EMS arrival pt a&Ox4, continued to c/o extreme pain, unable to get pt to come out of fetal position to assess right hip/leg. They administered 100 mcg of Fentanyl, pt didn't get any relief from that.

## 2013-08-30 ENCOUNTER — Encounter (HOSPITAL_COMMUNITY): Payer: Self-pay | Admitting: Orthopedic Surgery

## 2013-08-30 ENCOUNTER — Inpatient Hospital Stay (HOSPITAL_COMMUNITY): Payer: Medicare Other

## 2013-08-30 DIAGNOSIS — J9601 Acute respiratory failure with hypoxia: Secondary | ICD-10-CM

## 2013-08-30 DIAGNOSIS — J449 Chronic obstructive pulmonary disease, unspecified: Secondary | ICD-10-CM | POA: Diagnosis not present

## 2013-08-30 DIAGNOSIS — S72143A Displaced intertrochanteric fracture of unspecified femur, initial encounter for closed fracture: Secondary | ICD-10-CM | POA: Diagnosis not present

## 2013-08-30 DIAGNOSIS — N184 Chronic kidney disease, stage 4 (severe): Secondary | ICD-10-CM | POA: Diagnosis not present

## 2013-08-30 LAB — URINE CULTURE
CULTURE: NO GROWTH
Colony Count: NO GROWTH

## 2013-08-30 MED ORDER — SODIUM CHLORIDE 0.9 % IV SOLN
INTRAVENOUS | Status: DC
  Administered 2013-08-30: 07:00:00 via INTRAVENOUS

## 2013-08-30 MED ORDER — PRAVASTATIN SODIUM 40 MG PO TABS
40.0000 mg | ORAL_TABLET | Freq: Every day | ORAL | Status: DC
  Administered 2013-08-30 – 2013-09-01 (×3): 40 mg via ORAL
  Filled 2013-08-30 (×4): qty 1

## 2013-08-30 MED ORDER — FUROSEMIDE 10 MG/ML IJ SOLN
40.0000 mg | Freq: Once | INTRAMUSCULAR | Status: AC
  Administered 2013-08-30: 40 mg via INTRAVENOUS
  Filled 2013-08-30: qty 4

## 2013-08-30 NOTE — Care Management Note (Signed)
CARE MANAGEMENT NOTE 08/30/2013  Patient:  DOMONIC, [employer]   Account Number:  [id number]  Date Initiated:  08/30/2013  Documentation initiated by:  Ricki Miller  Subjective/Objective Assessment:   80 yr old female s/p fall with right hip IM nailing.     Action/Plan:   Patient is from [facility]. Plan is to return there. Social worker Barnett Applebaum  is aware. CM will follow.   Anticipated DC Date:  09/02/2013   Anticipated DC Plan:  SKILLED NURSING FACILITY  In-house referral  Clinical Social Worker      DC Planning Services  CM consult      Choice offered to / List presented to:             Status of service:  In process, will continue to follow

## 2013-08-30 NOTE — Progress Notes (Signed)
PATIENT ID: Jadan L Mathieson   1 Day Post-Op Procedure(s) (LRB): INTRAMEDULLARY (IM) NAIL INTERTROCHANTRIC (Right)  Subjective: Reports some discomfort in right hip. Sitting up comfortably in bed eating breakfast this morning. Reports she doesn't like laying in one position too long is looking forward to therapy. No other complaints or concerns.   Objective:  Filed Vitals:   08/30/13 0423  BP: 124/54  Pulse: 83  Temp: 99 F (37.2 C)  Resp: 16     Awake, alert, orientated R hip dressings c/d/i Wiggles toes, distally VNI  Labs:   Recent Labs  08/29/13 1133 08/29/13 1351  HGB 11.1* 11.6*   Recent Labs  08/29/13 1133 08/29/13 1351  WBC 10.1  --   RBC 3.89  --   HCT 33.9* 34.0*  PLT 275  --    Recent Labs  08/29/13 1133 08/29/13 1351  NA 141 142  K 4.3 4.3  CL 104 105  CO2 24  --   BUN 16 15  CREATININE 1.62* 1.80*  GLUCOSE 105* 104*  CALCIUM 9.3  --     Assessment and Plan: 1 day s/p right IM intertrochanteric nail fixation Doing well, nurse reports increased pain overnight. Suggest regimen of po pain rx and weaning of IV pain meds as soon as possible Will d/c ASA and plan on lovenox 40mg  inj daily x 30 days WBAT R LE Up with PT today Likely d/c monday  VTE proph: Lovenox 40mg  daily, SCDs

## 2013-08-30 NOTE — Evaluation (Signed)
Physical Therapy Evaluation Patient Details Name: Meredith Bender MRN: [id number] DOB: August 17, 1925 Today's Date: 08/30/2013 Time: 2725-3664 PT Time Calculation (min): 45 min  PT Assessment / Plan / Recommendation History of Present Illness  80 y/o WF was at [facility] for rehab after recent hospitalization and was attempting to sit on toilet when she fell on R side and suffered R nondisplaced intertrochanteric fracture.  Pt s/p IM nailing (08/29/13).   Clinical Impression  Pt moving at MOD A level at time of eval with o2 sats 83-89% on 3 L/min.  Recommend returning to SNF for continued rehab.  Will follow acutely to address deficits.    PT Assessment  Patient needs continued PT services    Follow Up Recommendations  SNF    Does the patient have the potential to tolerate intense rehabilitation      Barriers to Discharge        Equipment Recommendations  None recommended by PT    Recommendations for Other Services     Frequency Min 3X/week    Precautions / Restrictions Precautions Precautions: Fall Restrictions RLE Weight Bearing: Weight bearing as tolerated   Pertinent Vitals/Pain 10/10- nursing reports pt on scheduled pain meds      Mobility  Bed Mobility Overal bed mobility: Needs Assistance Bed Mobility: Supine to Sit Supine to sit: Mod assist Transfers Overall transfer level: Needs assistance Equipment used: Rolling walker (2 wheeled) Transfers: Sit to/from Stand Sit to Stand: Mod assist;From elevated surface General transfer comment: cueing for proper technique Ambulation/Gait Ambulation/Gait assistance: Mod assist Ambulation Distance (Feet): 2 Feet (bed > recliner) Assistive device: Rolling walker (2 wheeled) Gait Pattern/deviations: Decreased step length - left;Antalgic;Step-to pattern General Gait Details: Verbal cueing for use of UE and WB thru R LE.  R LE did buckle a bit at end, but pt able to recover.    Exercises Total Joint Exercises Ankle Circles/Pumps:  AROM;10 reps;Both Quad Sets: Both;5 reps Heel Slides: AAROM;10 reps;Right Hip ABduction/ADduction: AAROM;Right;5 reps   PT Diagnosis: Difficulty walking  PT Problem List: Decreased strength;Decreased range of motion;Decreased mobility;Pain;Decreased activity tolerance;Decreased balance PT Treatment Interventions: Gait training;Functional mobility training;Therapeutic activities;Therapeutic exercise     PT Goals(Current goals can be found in the care plan section) Acute Rehab PT Goals Patient Stated Goal: Son's goal to get pt up and moving as much as possible PT Goal Formulation: With patient Time For Goal Achievement: 09/13/13 Potential to Achieve Goals: Good  Visit Information  Last PT Received On: 08/30/13 Assistance Needed: +1 History of Present Illness: 80 y/o WF was at [facility] for rehab after recent hospitalization and was attempting to sit on toilet when she fell on R side and suffered R nondisplaced intertrochanteric fracture.  Pt s/p IM nailing (08/29/13).        Prior [place] expects to be discharged to:: Skilled nursing facility Additional Comments: Was at [facility] for rehab post recent hospital stay. Prior Function Level of Independence: Independent Comments: Amb without AD Communication Communication: No difficulties    Cognition  Cognition Arousal/Alertness: Awake/alert Behavior During Therapy: WFL for tasks assessed/performed Overall Cognitive Status: Within Functional Limits for tasks assessed    Extremity/Trunk Assessment Upper Extremity Assessment Upper Extremity Assessment: Defer to OT evaluation Lower Extremity Assessment Lower Extremity Assessment: RLE deficits/detail RLE Deficits / Details: Pt with AAROM 50% of normal limits.  RLE: Unable to fully assess due to pain Cervical / Trunk Assessment Cervical / Trunk Assessment: Normal   Balance Balance Overall balance assessment: Needs assistance Standing  balance-Leahy  Scale: Zero General Comments General comments (skin integrity, edema, etc.): O2 fluctuating between 83-89% entire treatment on 3 L/min.  Nursing aware.  Reviewed pursed lip breathing with pt and encouraged incentive spirometry once up.  End of Session PT - End of Session Equipment Utilized During Treatment: Gait belt Activity Tolerance: Patient limited by pain;Treatment limited secondary to medical complications (Comment) (decreased o2 sats) Patient left: in chair;with call bell/phone within reach;with family/visitor present Nurse Communication: Mobility status  GP     Troye Hiemstra LUBECK 08/30/2013, 11:01 AM

## 2013-08-30 NOTE — Progress Notes (Addendum)
Patient has 50cc urine output in 8.5 hours.  Patient also had limited urine output in OR and PACU per PACU nurse report.  On-call hospitalist provider notified.  New order to change IVF to NS at 75cc/hr.  Awaiting am lab work.  Foley not discontinued at this time for close urine monitoring.

## 2013-08-30 NOTE — Progress Notes (Signed)
OT Cancellation Note  Patient Details Name: Meredith Bender MRN: [id number] DOB: 30-Sep-1925   Cancelled Treatment:      Per chart pt is Medicare and plan is for SNF. Will defer OT eval to that facility. Please let OT know if eval is needed by contacting [phone] or going to [email or web address] (automatically changes to [facility]) or using [employer] thru the Web Links tab on [employer] and do an all OT page at [phone]   Almon Register 08/30/2013, 12:54 PM

## 2013-08-30 NOTE — Progress Notes (Signed)
MD--Patient has both Aspirin 325 BID and Lovenox 40mg  daily ordered.  Patient states aspirin burns her stomach and reports history of gastric ulcers.  Patient prefers Lovenox.  Please advise.  Thanks, Rosalie Gums, RN  08/30/13  [phone]

## 2013-08-30 NOTE — Progress Notes (Signed)
[facility] PROGRESS NOTE  Meredith Bender [id number] DOB: Jul 10, 1926 DOA: 08/29/2013 PCP: [name], MD  Assessment/Plan: Right Hip Fracture -s/p repair 08/29/13. -management as per ortho.  Acute Hypoxemic Respiratory Failure -ReCheck CXR. If negative may need to consider VQ scan to r/o PE. -Has COPD, but does appear to be tight on auscultation.  CKD Stage IV -Cr at baseline of 1.6-1.8.  HTN -Well controlled.  COPD -not acute exacerbation. -PRN nebs.  Code Status: Full Code Family Communication: Son Patrick Jupiter at bedside updated on plan of care.  Disposition Plan: SNF   Consultants:  Ortho   Antibiotics:  None   Subjective: No complaints.  Objective: Filed Vitals:   08/30/13 0400 08/30/13 0423 08/30/13 0553 08/30/13 0800  BP:  124/54    Pulse:  83    Temp:  99 F (37.2 C)    TempSrc:  Oral    Resp: 16 16  16   SpO2: 91% 89% 90% 90%    Intake/Output Summary (Last 24 hours) at 08/30/13 1428 Last data filed at 08/30/13 1213  Gross per 24 hour  Intake   1930 ml  Output    215 ml  Net   1715 ml   There were no vitals filed for this visit.  Exam:   General:  AA Ox3  Cardiovascular: RRR  Respiratory: CTA B  Abdomen: S/NT/ND/+BS  Extremities: no C/C/E   Neurologic:  Non-focal  Data Reviewed: Basic Metabolic Panel:  Recent Labs Lab 08/29/13 1133 08/29/13 1351  NA 141 142  K 4.3 4.3  CL 104 105  CO2 24  --   GLUCOSE 105* 104*  BUN 16 15  CREATININE 1.62* 1.80*  CALCIUM 9.3  --    Liver Function Tests: No results found for this basename: AST, ALT, ALKPHOS, BILITOT, PROT, ALBUMIN,  in the last 168 hours No results found for this basename: LIPASE, AMYLASE,  in the last 168 hours No results found for this basename: AMMONIA,  in the last 168 hours CBC:  Recent Labs Lab 08/29/13 1133 08/29/13 1351  WBC 10.1  --   NEUTROABS 8.5*  --   HGB 11.1* 11.6*  HCT 33.9* 34.0*  MCV 87.1  --   PLT 275  --    Cardiac Enzymes: No  results found for this basename: CKTOTAL, CKMB, CKMBINDEX, TROPONINI,  in the last 168 hours BNP (last 3 results) No results found for this basename: PROBNP,  in the last 8760 hours CBG: No results found for this basename: GLUCAP,  in the last 168 hours  Recent Results (from the past 240 hour(s))  URINE CULTURE     Status: None   Collection Time    08/29/13  1:03 PM      Result Value Ref Range Status   Specimen Description URINE, CATHETERIZED   Final   Special Requests NONE   Final   Culture  Setup Time     Final   Value: 08/29/2013 14:46     Performed at [facility]     Final   Value: NO GROWTH     Performed at [facility]   Culture     Final   Value: NO GROWTH     Performed at [facility]   Report Status 08/30/2013 FINAL   Final  SURGICAL PCR SCREEN     Status: None   Collection Time    08/29/13  3:37 PM      Result Value Ref Range  Status   MRSA, PCR NEGATIVE  NEGATIVE Final   Staphylococcus aureus NEGATIVE  NEGATIVE Final   Comment:            The Xpert SA Assay (FDA     approved for NASAL specimens     in patients over 15 years of age),     is one component of     a comprehensive surveillance     program.  Test performance has     been validated by [employer] for patients greater     than or equal to 34 year old.     It is not intended     to diagnose infection nor to     guide or monitor treatment.     Studies: Dg Chest 1 View  08/29/2013   CLINICAL DATA:  Hip fracture.  EXAM: CHEST - 1 VIEW  COMPARISON:  PA and lateral chest 02/28/2013.  FINDINGS: There is marked cardiomegaly but no edema. Lungs are emphysematous but clear. No pneumothorax or pleural effusion is identified. Remote healed right clavicle fracture is noted.  IMPRESSION: No acute disease.  Cardiomegaly without edema.  Pulmonary hyperexpansion compatible with emphysema.   Electronically Signed   By: Inge Rise M.D.   On: 08/29/2013 14:26   Dg Hip  Complete Right  08/29/2013   CLINICAL DATA:  [place] in bathroom one day ago with right hip pain  EXAM: RIGHT HIP - COMPLETE 2+ VIEW  COMPARISON:  None.  FINDINGS: There is a nondisplaced intertrochanteric right femur fracture with no angulation. There are no other acute abnormalities.  IMPRESSION: Nondisplaced intertrochanteric fracture   Electronically Signed   By: Skipper Cliche M.D.   On: 08/29/2013 12:32   Dg Hip Operative Right  08/29/2013   CLINICAL DATA:  Right hip fracture fixation.  EXAM: DG OPERATIVE RIGHT HIP  TECHNIQUE: A single spot fluoroscopic AP image of the right hip is submitted.  COMPARISON:  Radiographs same date.  FINDINGS: Four spot fluoroscopic images demonstrate dynamic screw and intramedullary nail fixation of the nondisplaced intertrochanteric femur fracture. The hardware appears well positioned. No complications are identified.  IMPRESSION: ORIF of intertrochanteric femur fracture without demonstrated complication.   Electronically Signed   By: Camie Patience M.D.   On: 08/29/2013 18:18   Dg Pelvis Portable  08/29/2013   CLINICAL DATA:  Postop proximal right femur ORIF  EXAM: PORTABLE PELVIS 1-2 VIEWS  COMPARISON:  08/29/2013  FINDINGS: The intra medullary rod and the compression screw are well-seated. Fracture fragments are well aligned. There is no new fracture or evidence of an operative complication.   Electronically Signed   By: Lajean Manes M.D.   On: 08/29/2013 18:59   Dg C-arm 1-60 Min  08/29/2013   CLINICAL DATA:  Right hip fracture fixation.  EXAM: DG OPERATIVE RIGHT HIP  TECHNIQUE: A single spot fluoroscopic AP image of the right hip is submitted.  COMPARISON:  Radiographs same date.  FINDINGS: Four spot fluoroscopic images demonstrate dynamic screw and intramedullary nail fixation of the nondisplaced intertrochanteric femur fracture. The hardware appears well positioned. No complications are identified.  IMPRESSION: ORIF of intertrochanteric femur fracture without  demonstrated complication.   Electronically Signed   By: Camie Patience M.D.   On: 08/29/2013 18:18    Scheduled Meds: . enoxaparin (LOVENOX) injection  40 mg Subcutaneous Q24H  . hydrALAZINE  25 mg Oral TID  . loratadine  10 mg Oral Daily  . pantoprazole  40 mg  Oral Daily  . pravastatin  40 mg Oral q1800  . traMADol  100 mg Oral TID  . verapamil  240 mg Oral QHS  . vitamin E  400 Units Oral Daily   Continuous Infusions: . sodium chloride 75 mL/hr at 08/30/13 0705    Active Problems:   Hypertension   CKD (chronic kidney disease) stage 4, GFR 15-29 ml/min   Dyslipidemia   Hip fracture   Intertrochanteric fracture of right hip    Time spent: 35 minutes. Greater than 50% of this time was spent in direct contact with the patient coordinating care.    Lelon Frohlich  [facility] Pager [phone]  If 7PM-7AM, please contact night-coverage at [email or web address], password [facility] 08/30/2013, 2:28 PM  LOS: 1 day

## 2013-08-30 NOTE — Progress Notes (Signed)
Chest x-ray resulted.  Dr. Jerilee Hoh aware, order to d/c IV fluids, hold 1600 hydralazine x1 dose, and give IV Lasix 40mg  x1 dose.  Nursing will continue to monitor.

## 2013-08-31 DIAGNOSIS — J810 Acute pulmonary edema: Secondary | ICD-10-CM

## 2013-08-31 DIAGNOSIS — I509 Heart failure, unspecified: Secondary | ICD-10-CM

## 2013-08-31 DIAGNOSIS — I5033 Acute on chronic diastolic (congestive) heart failure: Secondary | ICD-10-CM | POA: Diagnosis not present

## 2013-08-31 DIAGNOSIS — I5032 Chronic diastolic (congestive) heart failure: Secondary | ICD-10-CM

## 2013-08-31 DIAGNOSIS — S72143A Displaced intertrochanteric fracture of unspecified femur, initial encounter for closed fracture: Secondary | ICD-10-CM | POA: Diagnosis not present

## 2013-08-31 LAB — BASIC METABOLIC PANEL
BUN: 20 mg/dL (ref 6–23)
CO2: 26 meq/L (ref 19–32)
Calcium: 8.3 mg/dL — ABNORMAL LOW (ref 8.4–10.5)
Chloride: 101 mEq/L (ref 96–112)
Creatinine, Ser: 2.29 mg/dL — ABNORMAL HIGH (ref 0.50–1.10)
GFR calc Af Amer: 21 mL/min — ABNORMAL LOW (ref >90)
GFR calc non Af Amer: 18 mL/min — ABNORMAL LOW (ref >90)
Glucose, Bld: 98 mg/dL (ref 70–99)
Potassium: 4.1 mEq/L (ref 3.7–5.3)
SODIUM: 138 meq/L (ref 137–147)

## 2013-08-31 LAB — CBC
HEMATOCRIT: 28.6 % — AB (ref 36.0–46.0)
HEMOGLOBIN: 9.2 g/dL — AB (ref 12.0–15.0)
MCH: 28.8 pg (ref 26.0–34.0)
MCHC: 32.2 g/dL (ref 30.0–36.0)
MCV: 89.4 fL (ref 78.0–100.0)
Platelets: 216 10*3/uL (ref 150–400)
RBC: 3.2 MIL/uL — AB (ref 3.87–5.11)
RDW: 15 % (ref 11.5–15.5)
WBC: 11.4 10*3/uL — ABNORMAL HIGH (ref 4.0–10.5)

## 2013-08-31 MED ORDER — FUROSEMIDE 10 MG/ML IJ SOLN
40.0000 mg | Freq: Every day | INTRAMUSCULAR | Status: DC
  Administered 2013-09-01 – 2013-09-02 (×2): 40 mg via INTRAVENOUS
  Filled 2013-08-31 (×3): qty 4

## 2013-08-31 MED ORDER — FUROSEMIDE 10 MG/ML IJ SOLN
INTRAMUSCULAR | Status: AC
  Administered 2013-08-31: 40 mg
  Filled 2013-08-31: qty 4

## 2013-08-31 NOTE — Progress Notes (Signed)
[facility] PROGRESS NOTE  Meredith Bender SPQ:[id number] DOB: 1926/06/25 DOA: 08/29/2013 PCP: [name], MD  Assessment/Plan: Right Hip Fracture -s/p repair 08/29/13. -management as per ortho.  Acute Hypoxemic Respiratory Failure -2/2 acute diastolic CHF. -See below for details. -Continue oxygen support as needed.  Acute Diastolic CHF -Strict Is and Os, daily weights. -ECHO in 8/14: EF 50-55% and grade 1 diastolic dysfunction. -Continue lasix and strive for negative fluid balance.  CKD Stage IV -Cr  baseline of 1.6-1.8. -Up to 2.11 today; suspect related to diuresis.  HTN -Well controlled.  COPD -not acute exacerbation. -PRN nebs.  Code Status: Full Code Family Communication: Patient only.  Disposition Plan: SNF   Consultants:  Ortho   Antibiotics:  None   Subjective: No complaints.  Objective: Filed Vitals:   08/31/13 0424 08/31/13 0640 08/31/13 1300 08/31/13 1433  BP: 123/50  102/41 124/46  Pulse: 67  66 66  Temp: 97.9 F (36.6 C)  97.5 F (36.4 C) 97.6 F (36.4 C)  TempSrc: Oral   Oral  Resp: 16  16 16   SpO2: 92% 88% 90% 88%    Intake/Output Summary (Last 24 hours) at 08/31/13 1507 Last data filed at 08/31/13 0744  Gross per 24 hour  Intake   1020 ml  Output   1000 ml  Net     20 ml   There were no vitals filed for this visit.  Exam:   General:  AA Ox3  Cardiovascular: RRR  Respiratory: CTA B  Abdomen: S/NT/ND/+BS  Extremities: no C/C/E   Neurologic:  Non-focal  Data Reviewed: Basic Metabolic Panel:  Recent Labs Lab 08/29/13 1133 08/29/13 1351 08/31/13 0540  NA 141 142 138  K 4.3 4.3 4.1  CL 104 105 101  CO2 24  --  26  GLUCOSE 105* 104* 98  BUN 16 15 20   CREATININE 1.62* 1.80* 2.29*  CALCIUM 9.3  --  8.3*   Liver Function Tests: No results found for this basename: AST, ALT, ALKPHOS, BILITOT, PROT, ALBUMIN,  in the last 168 hours No results found for this basename: LIPASE, AMYLASE,  in the last 168  hours No results found for this basename: AMMONIA,  in the last 168 hours CBC:  Recent Labs Lab 08/29/13 1133 08/29/13 1351 08/31/13 0540  WBC 10.1  --  11.4*  NEUTROABS 8.5*  --   --   HGB 11.1* 11.6* 9.2*  HCT 33.9* 34.0* 28.6*  MCV 87.1  --  89.4  PLT 275  --  216   Cardiac Enzymes: No results found for this basename: CKTOTAL, CKMB, CKMBINDEX, TROPONINI,  in the last 168 hours BNP (last 3 results) No results found for this basename: PROBNP,  in the last 8760 hours CBG: No results found for this basename: GLUCAP,  in the last 168 hours  Recent Results (from the past 240 hour(s))  URINE CULTURE     Status: None   Collection Time    08/29/13  1:03 PM      Result Value Ref Range Status   Specimen Description URINE, CATHETERIZED   Final   Special Requests NONE   Final   Culture  Setup Time     Final   Value: 08/29/2013 14:46     Performed at [place]     Final   Value: NO GROWTH     Performed at [facility]   Culture     Final   Value: NO GROWTH  Performed at [facility]   Report Status 08/30/2013 FINAL   Final  SURGICAL PCR SCREEN     Status: None   Collection Time    2013-09-22  3:37 PM      Result Value Ref Range Status   MRSA, PCR NEGATIVE  NEGATIVE Final   Staphylococcus aureus NEGATIVE  NEGATIVE Final   Comment:            The Xpert SA Assay (FDA     approved for NASAL specimens     in patients over 78 years of age),     is one component of     a comprehensive surveillance     program.  Test performance has     been validated by [employer] for patients greater     than or equal to 40 year old.     It is not intended     to diagnose infection nor to     guide or monitor treatment.     Studies: Dg Hip Operative Right  2013/09/22   CLINICAL DATA:  Right hip fracture fixation.  EXAM: DG OPERATIVE RIGHT HIP  TECHNIQUE: A single spot fluoroscopic AP image of the right hip is submitted.  COMPARISON:   Radiographs same date.  FINDINGS: Four spot fluoroscopic images demonstrate dynamic screw and intramedullary nail fixation of the nondisplaced intertrochanteric femur fracture. The hardware appears well positioned. No complications are identified.  IMPRESSION: ORIF of intertrochanteric femur fracture without demonstrated complication.   Electronically Signed   By: Camie Patience M.D.   On: 09-22-13 18:18   Dg Pelvis Portable  Sep 22, 2013   CLINICAL DATA:  Postop proximal right femur ORIF  EXAM: PORTABLE PELVIS 1-2 VIEWS  COMPARISON:  2013-09-22  FINDINGS: The intra medullary rod and the compression screw are well-seated. Fracture fragments are well aligned. There is no new fracture or evidence of an operative complication.   Electronically Signed   By: Lajean Manes M.D.   On: 09/22/13 18:59   Dg Chest Port 1 View  08/30/2013   CLINICAL DATA:  Hypoxemia, cough, history hypertension, COPD, stroke  EXAM: PORTABLE CHEST - 1 VIEW  COMPARISON:  Portable exam [id number] hr compared to 06/28/2014  FINDINGS: Enlargement of cardiac silhouette.  Atherosclerotic calcification aorta.  Mediastinal contours otherwise normal.  Pulmonary vascular congestion.  Interstitial infiltrates bilaterally question pulmonary edema.  Suspect underlying COPD.  No definite pleural effusion or pneumothorax.  Bones demineralized.  IMPRESSION: Enlargement of cardiac silhouette with pulmonary vascular congestion and question mild pulmonary edema.   Electronically Signed   By: Lavonia Dana M.D.   On: 08/30/2013 15:29   Dg C-arm 1-60 Min  09/22/13   CLINICAL DATA:  Right hip fracture fixation.  EXAM: DG OPERATIVE RIGHT HIP  TECHNIQUE: A single spot fluoroscopic AP image of the right hip is submitted.  COMPARISON:  Radiographs same date.  FINDINGS: Four spot fluoroscopic images demonstrate dynamic screw and intramedullary nail fixation of the nondisplaced intertrochanteric femur fracture. The hardware appears well positioned. No complications are  identified.  IMPRESSION: ORIF of intertrochanteric femur fracture without demonstrated complication.   Electronically Signed   By: Camie Patience M.D.   On: 09-22-2013 18:18    Scheduled Meds: . enoxaparin (LOVENOX) injection  40 mg Subcutaneous Q24H  . furosemide  40 mg Intravenous Daily  . hydrALAZINE  25 mg Oral TID  . loratadine  10 mg Oral Daily  . pantoprazole  40 mg Oral Daily  .  pravastatin  40 mg Oral q1800  . traMADol  100 mg Oral TID  . verapamil  240 mg Oral QHS  . vitamin E  400 Units Oral Daily   Continuous Infusions:    Active Problems:   Hypertension   CKD (chronic kidney disease) stage 4, GFR 15-29 ml/min   Dyslipidemia   Hip fracture   Intertrochanteric fracture of right hip   Acute respiratory failure with hypoxia   Acute pulmonary edema   Acute on chronic diastolic CHF (congestive heart failure)    Time spent: 35 minutes. Greater than 50% of this time was spent in direct contact with the patient coordinating care.    Lelon Frohlich  [facility] Pager [phone]  If 7PM-7AM, please contact night-coverage at [email or web address], password [facility] 08/31/2013, 3:07 PM  LOS: 2 days

## 2013-08-31 NOTE — Progress Notes (Signed)
MD aware of F/C.

## 2013-08-31 NOTE — Progress Notes (Signed)
PATIENT ID: Meredith Bender  MRN: [id number]  DOB/AGE:  78/07/1925 / 78 y.o.  2 Days Post-Op Procedure(s) (LRB): INTRAMEDULLARY (IM) NAIL INTERTROCHANTRIC (Right)    PROGRESS NOTE Subjective: Patient is alert, oriented,no Nausea, no Vomiting, yes passing gas, no Bowel Movement. Taking PO well. Denies SOB, Chest or Calf Pain. Using Incentive Spirometer, PAS in place. Ambulate WBAT Patient reports pain as moderate  .    Objective: Vital signs in last 24 hours: Filed Vitals:   08/31/13 0000 08/31/13 0105 08/31/13 0424 08/31/13 0640  BP:  120/66 123/50   Pulse:  70 67   Temp:  97.4 F (36.3 C) 97.9 F (36.6 C)   TempSrc:  Oral Oral   Resp:  16 16   SpO2: 95% 90% 92% 88%      Intake/Output from previous day: I/O last 3 completed shifts: In: 2250 [P.O.:1750; I.V.:500] Out: 1050 [Urine:1050]   Intake/Output this shift: Total I/O In: 200 [P.O.:200] Out: -    LABORATORY DATA:  Recent Labs  08/29/13 1133 08/29/13 1351 08/31/13 0540  WBC 10.1  --  11.4*  HGB 11.1* 11.6* 9.2*  HCT 33.9* 34.0* 28.6*  PLT 275  --  216  NA 141 142 138  K 4.3 4.3 4.1  CL 104 105 101  CO2 24  --  26  BUN 16 15 20   CREATININE 1.62* 1.80* 2.29*  GLUCOSE 105* 104* 98  INR 1.01  --   --   CALCIUM 9.3  --  8.3*    Examination: Neurologically intact Neurovascular intact Sensation intact distally Intact pulses distally Dorsiflexion/Plantar flexion intact Incision: dressing C/D/I and no drainage No cellulitis present Compartment soft} XR AP&Lat of hip shows well placed\fixed THA  Assessment:   2 Days Post-Op Procedure(s) (LRB): INTRAMEDULLARY (IM) NAIL INTERTROCHANTRIC (Right) ADDITIONAL DIAGNOSIS:  Hypertension, Renal Insufficiency Chronic and COPD   Plan: PT/OT WBAT, THA  posterior precautions  DVT Prophylaxis: Lovenox x 30 days  DISCHARGE PLAN: Skilled Nursing Facility/Rehab Monday if pt passes PT goals.  DISCHARGE NEEDS: HHPT, HHRN, Walker and 3-in-1 comode seat

## 2013-09-01 DIAGNOSIS — N179 Acute kidney failure, unspecified: Secondary | ICD-10-CM

## 2013-09-01 DIAGNOSIS — S72143A Displaced intertrochanteric fracture of unspecified femur, initial encounter for closed fracture: Secondary | ICD-10-CM | POA: Diagnosis not present

## 2013-09-01 DIAGNOSIS — J9600 Acute respiratory failure, unspecified whether with hypoxia or hypercapnia: Secondary | ICD-10-CM | POA: Diagnosis not present

## 2013-09-01 DIAGNOSIS — J810 Acute pulmonary edema: Secondary | ICD-10-CM | POA: Diagnosis not present

## 2013-09-01 DIAGNOSIS — I5033 Acute on chronic diastolic (congestive) heart failure: Secondary | ICD-10-CM | POA: Diagnosis not present

## 2013-09-01 LAB — CBC
HCT: 27.2 % — ABNORMAL LOW (ref 36.0–46.0)
Hemoglobin: 8.9 g/dL — ABNORMAL LOW (ref 12.0–15.0)
MCH: 28.9 pg (ref 26.0–34.0)
MCHC: 32.7 g/dL (ref 30.0–36.0)
MCV: 88.3 fL (ref 78.0–100.0)
PLATELETS: 235 10*3/uL (ref 150–400)
RBC: 3.08 MIL/uL — ABNORMAL LOW (ref 3.87–5.11)
RDW: 14.7 % (ref 11.5–15.5)
WBC: 11.7 10*3/uL — ABNORMAL HIGH (ref 4.0–10.5)

## 2013-09-01 LAB — BASIC METABOLIC PANEL
BUN: 23 mg/dL (ref 6–23)
CALCIUM: 8.2 mg/dL — AB (ref 8.4–10.5)
CO2: 24 meq/L (ref 19–32)
CREATININE: 2.48 mg/dL — AB (ref 0.50–1.10)
Chloride: 101 mEq/L (ref 96–112)
GFR calc Af Amer: 19 mL/min — ABNORMAL LOW (ref >90)
GFR calc non Af Amer: 17 mL/min — ABNORMAL LOW (ref >90)
Glucose, Bld: 92 mg/dL (ref 70–99)
Potassium: 4.5 mEq/L (ref 3.7–5.3)
Sodium: 138 mEq/L (ref 137–147)

## 2013-09-01 NOTE — Progress Notes (Signed)
Clinical Social Work Department BRIEF PSYCHOSOCIAL ASSESSMENT 09/01/2013  Patient:  Meredith Bender, Meredith Bender     Account Number:  [id number]     Admit date:  08/29/2013  Clinical Social Worker:  Rolinda Roan  Date/Time:  09/01/2013 02:22 PM  Referred by:  Physician  Date Referred:  08/30/2013 Referred for  SNF Placement   Other Referral:   Interview type:  Patient Other interview type:    PSYCHOSOCIAL DATA Living Status:  FACILITY Admitted from facility:  [place] Level of care:  [place] Primary support name:  Tomasita Morrow [phone] Primary support relationship to patient:  CHILD, ADULT Degree of support available:   Good support per patient.    CURRENT CONCERNS  Other Concerns:    SOCIAL WORK ASSESSMENT / PLAN Clinical Social Worker (CSW) met with patient to discuss D/C plan. Patient reported that she lives in [place] alone and has been to [facility] for short term rehab. Patient identified her daughter [name] and her son Darnell Level as her primary support. Patient wanted to initiate a SNF search in [facility] and is okay with going back to [facility]. CSW left a message with patient's daughter Rickie.   Assessment/plan status:  Psychosocial Support/Ongoing Assessment of Needs Other assessment/ plan:   Information/referral to community resources:   CSW gave patient SNF list.    PATIENT'S/FAMILY'S RESPONSE TO PLAN OF CARE: Patient thanked CSW for visit.

## 2013-09-01 NOTE — Progress Notes (Signed)
PATIENT ID: Meredith Bender  MRN: [id number]  DOB/AGE:  Mar 31, 1926 / 80 y.o.  3 Days Post-Op Procedure(s) (LRB): INTRAMEDULLARY (IM) NAIL INTERTROCHANTRIC (Right)    PROGRESS NOTE Subjective: Patient is alert, oriented,no Nausea, no Vomiting, yes passing gas, no Bowel Movement. Taking PO well. Denies SOB, Chest or Calf Pain. Using Incentive Spirometer, PAS in place. Ambulate WBAT Patient reports pain as mild  .    Objective: Vital signs in last 24 hours: Filed Vitals:   09/01/13 0225 09/01/13 0400 09/01/13 0500 09/01/13 0551  BP:    149/56  Pulse:    77  Temp:    97.9 F (36.6 C)  TempSrc:    Oral  Resp:  18  18  Height:      Weight:   48.988 kg (108 lb)   SpO2: 91% 93%  93%      Intake/Output from previous day: I/O last 3 completed shifts: In: 1880 [P.O.:1880] Out: 1800 [Urine:1800]   Intake/Output this shift: Total I/O In: 120 [P.O.:120] Out: -    LABORATORY DATA:  Recent Labs  08/29/13 1133  08/31/13 0540 09/01/13 0550  WBC 10.1  --  11.4* 11.7*  HGB 11.1*  < > 9.2* 8.9*  HCT 33.9*  < > 28.6* 27.2*  PLT 275  --  216 235  NA 141  < > 138 138  K 4.3  < > 4.1 4.5  CL 104  < > 101 101  CO2 24  --  26 24  BUN 16  < > 20 23  CREATININE 1.62*  < > 2.29* 2.48*  GLUCOSE 105*  < > 98 92  INR 1.01  --   --   --   CALCIUM 9.3  --  8.3* 8.2*  < > = values in this interval not displayed.  Examination: Neurologically intact Neurovascular intact Sensation intact distally Intact pulses distally Dorsiflexion/Plantar flexion intact Incision: dressing C/D/I No cellulitis present Compartment soft} XR AP&Lat of hip shows well placed\fixed THA  Assessment:   3 Days Post-Op Procedure(s) (LRB): INTRAMEDULLARY (IM) NAIL INTERTROCHANTRIC (Right) ADDITIONAL DIAGNOSIS:  Hypertension, Renal Insufficiency Chronic and COPD   Plan: PT/OT WBAT, THA  posterior precautions  DVT Prophylaxis: SCDx72 hrs, Lovenox x 30 days  DISCHARGE PLAN: Skilled Nursing Facility/Rehab Monday  if Pt passes PT goals.  DISCHARGE NEEDS: HHPT, HHRN, Walker and 3-in-1 comode seat

## 2013-09-01 NOTE — Progress Notes (Addendum)
Clinical Social Work Department CLINICAL SOCIAL WORK PLACEMENT NOTE 09/01/2013  Patient:  Meredith Bender, Meredith Bender  Account Number:  [id number] Admit date:  08/29/2013  Clinical Social Worker:  Blima Rich, Latanya Presser  Date/time:  09/01/2013 02:29 PM  Clinical Social Work is seeking post-discharge placement for this patient at the following level of care:   [place]   (*CSW will update this form in Epic as items are completed)   09/01/2013  Patient/family provided with [place] Department of Clinical Social Work's list of facilities offering this level of care within the geographic area requested by the patient (or if unable, by the patient's family).  09/01/2013  Patient/family informed of their freedom to choose among providers that offer the needed level of care, that participate in Medicare, Medicaid or managed care program needed by the patient, have an available bed and are willing to accept the patient.  09/01/2013  Patient/family informed of [facility]' ownership interest in [facility], as well as of the fact that they are under no obligation to receive care at this facility.  PASARR submitted to EDS on 07/25/2013 PASARR number received from EDS on 07/25/2013  FL2 transmitted to all facilities in geographic area requested by pt/family on  09/01/2013 FL2 transmitted to all facilities within larger geographic area on   Patient informed that his/her managed care company has contracts with or will negotiate with  certain facilities, including the following:     Patient/family informed of bed offers received:  ---  Patient chooses bed at  Physician recommends and patient chooses bed at  Returning to [place]  Patient to be transferred to [place]  on  09/02/2013 Patient to be transferred to facility by [facility]  The following physician request were entered in Epic:   Additional Comments: Patient has an [place].

## 2013-09-01 NOTE — Progress Notes (Signed)
[facility] PROGRESS NOTE  Meredith Bender NTI:[id number] DOB: 03-04-1926 DOA: 08/29/2013 PCP: [name], MD  Assessment/Plan: Right Hip Fracture -s/p repair 08/29/13. -management as per ortho. -For SNF.  Acute Hypoxemic Respiratory Failure -2/2 acute diastolic CHF. -See below for details. -Continue oxygen support as needed.  Acute Diastolic CHF -Strict Is and Os, daily weights. -ECHO in 8/14: EF 50-55% and grade 1 diastolic dysfunction. -Continue lasix and strive for negative fluid balance.  Acute on CKD Stage IV -Cr  baseline of 1.6-1.8. -Up to 2.48 today; suspect related to diuresis.  HTN -Well controlled.  COPD -not acute exacerbation. -PRN nebs.  Code Status: Full Code Family Communication: Patient only.  Disposition Plan: SNF   Consultants:  Ortho   Antibiotics:  None   Subjective: No complaints.  Objective: Filed Vitals:   09/01/13 0500 09/01/13 0551 09/01/13 0800 09/01/13 1312  BP:  149/56  134/57  Pulse:  77  75  Temp:  97.9 F (36.6 C)  98 F (36.7 C)  TempSrc:  Oral    Resp:  18 18 18   Height:      Weight: 48.988 kg (108 lb)     SpO2:  93% 93% 98%    Intake/Output Summary (Last 24 hours) at 09/01/13 1607 Last data filed at 09/01/13 1216  Gross per 24 hour  Intake    960 ml  Output    900 ml  Net     60 ml   Filed Weights   08/31/13 1647 09/01/13 0500  Weight: 55.1 kg (121 lb 7.6 oz) 48.988 kg (108 lb)    Exam:   General:  AA Ox3  Cardiovascular: RRR  Respiratory: CTA B  Abdomen: S/NT/ND/+BS  Extremities: no C/C/E   Neurologic:  Non-focal  Data Reviewed: Basic Metabolic Panel:  Recent Labs Lab 08/29/13 1133 08/29/13 1351 08/31/13 0540 09/01/13 0550  NA 141 142 138 138  K 4.3 4.3 4.1 4.5  CL 104 105 101 101  CO2 24  --  26 24  GLUCOSE 105* 104* 98 92  BUN 16 15 20 23   CREATININE 1.62* 1.80* 2.29* 2.48*  CALCIUM 9.3  --  8.3* 8.2*   Liver Function Tests: No results found for this basename: AST,  ALT, ALKPHOS, BILITOT, PROT, ALBUMIN,  in the last 168 hours No results found for this basename: LIPASE, AMYLASE,  in the last 168 hours No results found for this basename: AMMONIA,  in the last 168 hours CBC:  Recent Labs Lab 08/29/13 1133 08/29/13 1351 08/31/13 0540 09/01/13 0550  WBC 10.1  --  11.4* 11.7*  NEUTROABS 8.5*  --   --   --   HGB 11.1* 11.6* 9.2* 8.9*  HCT 33.9* 34.0* 28.6* 27.2*  MCV 87.1  --  89.4 88.3  PLT 275  --  216 235   Cardiac Enzymes: No results found for this basename: CKTOTAL, CKMB, CKMBINDEX, TROPONINI,  in the last 168 hours BNP (last 3 results) No results found for this basename: PROBNP,  in the last 8760 hours CBG: No results found for this basename: GLUCAP,  in the last 168 hours  Recent Results (from the past 240 hour(s))  URINE CULTURE     Status: None   Collection Time    08/29/13  1:03 PM      Result Value Ref Range Status   Specimen Description URINE, CATHETERIZED   Final   Special Requests NONE   Final   Culture  Setup Time     Final  Value: 08/29/2013 14:46     Performed at [facility]     Final   Value: NO GROWTH     Performed at [facility]   Culture     Final   Value: NO GROWTH     Performed at [facility]   Report Status 08/30/2013 FINAL   Final  SURGICAL PCR SCREEN     Status: None   Collection Time    08/29/13  3:37 PM      Result Value Ref Range Status   MRSA, PCR NEGATIVE  NEGATIVE Final   Staphylococcus aureus NEGATIVE  NEGATIVE Final   Comment:            The Xpert SA Assay (FDA     approved for NASAL specimens     in patients over 36 years of age),     is one component of     a comprehensive surveillance     program.  Test performance has     been validated by [employer] for patients greater     than or equal to 26 year old.     It is not intended     to diagnose infection nor to     guide or monitor treatment.     Studies: No results found.  Scheduled  Meds: . enoxaparin (LOVENOX) injection  40 mg Subcutaneous Q24H  . furosemide  40 mg Intravenous Daily  . hydrALAZINE  25 mg Oral TID  . loratadine  10 mg Oral Daily  . pantoprazole  40 mg Oral Daily  . pravastatin  40 mg Oral q1800  . traMADol  100 mg Oral TID  . verapamil  240 mg Oral QHS  . vitamin E  400 Units Oral Daily   Continuous Infusions:    Active Problems:   Hypertension   CKD (chronic kidney disease) stage 4, GFR 15-29 ml/min   Dyslipidemia   Hip fracture   Intertrochanteric fracture of right hip   Acute respiratory failure with hypoxia   Acute pulmonary edema   Acute on chronic diastolic CHF (congestive heart failure)    Time spent: 25 minutes. Greater than 50% of this time was spent in direct contact with the patient coordinating care.    Lelon Frohlich  [facility] Pager [phone]  If 7PM-7AM, please contact night-coverage at [email or web address], password [facility] 09/01/2013, 4:07 PM  LOS: 3 days

## 2013-09-02 DIAGNOSIS — R269 Unspecified abnormalities of gait and mobility: Secondary | ICD-10-CM | POA: Diagnosis not present

## 2013-09-02 DIAGNOSIS — I10 Essential (primary) hypertension: Secondary | ICD-10-CM | POA: Diagnosis not present

## 2013-09-02 DIAGNOSIS — D631 Anemia in chronic kidney disease: Secondary | ICD-10-CM | POA: Diagnosis not present

## 2013-09-02 DIAGNOSIS — I129 Hypertensive chronic kidney disease with stage 1 through stage 4 chronic kidney disease, or unspecified chronic kidney disease: Secondary | ICD-10-CM | POA: Diagnosis not present

## 2013-09-02 DIAGNOSIS — S72143A Displaced intertrochanteric fracture of unspecified femur, initial encounter for closed fracture: Secondary | ICD-10-CM | POA: Diagnosis not present

## 2013-09-02 DIAGNOSIS — I5033 Acute on chronic diastolic (congestive) heart failure: Secondary | ICD-10-CM | POA: Diagnosis not present

## 2013-09-02 DIAGNOSIS — S72009A Fracture of unspecified part of neck of unspecified femur, initial encounter for closed fracture: Secondary | ICD-10-CM | POA: Diagnosis not present

## 2013-09-02 DIAGNOSIS — S79919A Unspecified injury of unspecified hip, initial encounter: Secondary | ICD-10-CM | POA: Diagnosis not present

## 2013-09-02 DIAGNOSIS — G8918 Other acute postprocedural pain: Secondary | ICD-10-CM | POA: Diagnosis not present

## 2013-09-02 DIAGNOSIS — R1900 Intra-abdominal and pelvic swelling, mass and lump, unspecified site: Secondary | ICD-10-CM | POA: Diagnosis not present

## 2013-09-02 DIAGNOSIS — Z794 Long term (current) use of insulin: Secondary | ICD-10-CM | POA: Diagnosis not present

## 2013-09-02 DIAGNOSIS — I5030 Unspecified diastolic (congestive) heart failure: Secondary | ICD-10-CM | POA: Diagnosis not present

## 2013-09-02 DIAGNOSIS — J9600 Acute respiratory failure, unspecified whether with hypoxia or hypercapnia: Secondary | ICD-10-CM | POA: Diagnosis not present

## 2013-09-02 DIAGNOSIS — N2581 Secondary hyperparathyroidism of renal origin: Secondary | ICD-10-CM | POA: Diagnosis not present

## 2013-09-02 DIAGNOSIS — Z880 Allergy status to penicillin: Secondary | ICD-10-CM | POA: Diagnosis not present

## 2013-09-02 DIAGNOSIS — M6281 Muscle weakness (generalized): Secondary | ICD-10-CM | POA: Diagnosis not present

## 2013-09-02 DIAGNOSIS — A0472 Enterocolitis due to Clostridium difficile, not specified as recurrent: Secondary | ICD-10-CM | POA: Diagnosis not present

## 2013-09-02 DIAGNOSIS — N184 Chronic kidney disease, stage 4 (severe): Secondary | ICD-10-CM | POA: Diagnosis not present

## 2013-09-02 DIAGNOSIS — Z79899 Other long term (current) drug therapy: Secondary | ICD-10-CM | POA: Diagnosis not present

## 2013-09-02 DIAGNOSIS — J449 Chronic obstructive pulmonary disease, unspecified: Secondary | ICD-10-CM | POA: Diagnosis not present

## 2013-09-02 DIAGNOSIS — M129 Arthropathy, unspecified: Secondary | ICD-10-CM | POA: Diagnosis not present

## 2013-09-02 DIAGNOSIS — E785 Hyperlipidemia, unspecified: Secondary | ICD-10-CM | POA: Diagnosis not present

## 2013-09-02 DIAGNOSIS — Z8673 Personal history of transient ischemic attack (TIA), and cerebral infarction without residual deficits: Secondary | ICD-10-CM | POA: Diagnosis not present

## 2013-09-02 DIAGNOSIS — I69998 Other sequelae following unspecified cerebrovascular disease: Secondary | ICD-10-CM | POA: Diagnosis not present

## 2013-09-02 DIAGNOSIS — Z8781 Personal history of (healed) traumatic fracture: Secondary | ICD-10-CM | POA: Diagnosis not present

## 2013-09-02 DIAGNOSIS — I209 Angina pectoris, unspecified: Secondary | ICD-10-CM | POA: Diagnosis not present

## 2013-09-02 DIAGNOSIS — I509 Heart failure, unspecified: Secondary | ICD-10-CM | POA: Diagnosis not present

## 2013-09-02 DIAGNOSIS — N189 Chronic kidney disease, unspecified: Secondary | ICD-10-CM | POA: Diagnosis not present

## 2013-09-02 DIAGNOSIS — R079 Chest pain, unspecified: Secondary | ICD-10-CM | POA: Diagnosis not present

## 2013-09-02 DIAGNOSIS — F0390 Unspecified dementia without behavioral disturbance: Secondary | ICD-10-CM | POA: Diagnosis not present

## 2013-09-02 DIAGNOSIS — S72009D Fracture of unspecified part of neck of unspecified femur, subsequent encounter for closed fracture with routine healing: Secondary | ICD-10-CM | POA: Diagnosis not present

## 2013-09-02 DIAGNOSIS — M25559 Pain in unspecified hip: Secondary | ICD-10-CM | POA: Diagnosis not present

## 2013-09-02 DIAGNOSIS — IMO0002 Reserved for concepts with insufficient information to code with codable children: Secondary | ICD-10-CM | POA: Diagnosis not present

## 2013-09-02 LAB — TYPE AND SCREEN
ABO/RH(D): O POS
Antibody Screen: POSITIVE
DAT, IgG: NEGATIVE
Donor AG Type: NEGATIVE
Donor AG Type: NEGATIVE
PT AG Type: NEGATIVE
Unit division: 0
Unit division: 0

## 2013-09-02 LAB — CBC
HCT: 28.8 % — ABNORMAL LOW (ref 36.0–46.0)
Hemoglobin: 9.5 g/dL — ABNORMAL LOW (ref 12.0–15.0)
MCH: 28.8 pg (ref 26.0–34.0)
MCHC: 33 g/dL (ref 30.0–36.0)
MCV: 87.3 fL (ref 78.0–100.0)
PLATELETS: 286 10*3/uL (ref 150–400)
RBC: 3.3 MIL/uL — ABNORMAL LOW (ref 3.87–5.11)
RDW: 14.6 % (ref 11.5–15.5)
WBC: 9.4 10*3/uL (ref 4.0–10.5)

## 2013-09-02 LAB — BASIC METABOLIC PANEL
BUN: 22 mg/dL (ref 6–23)
CALCIUM: 8.6 mg/dL (ref 8.4–10.5)
CO2: 26 meq/L (ref 19–32)
Chloride: 101 mEq/L (ref 96–112)
Creatinine, Ser: 2.22 mg/dL — ABNORMAL HIGH (ref 0.50–1.10)
GFR calc Af Amer: 22 mL/min — ABNORMAL LOW (ref >90)
GFR calc non Af Amer: 19 mL/min — ABNORMAL LOW (ref >90)
Glucose, Bld: 91 mg/dL (ref 70–99)
Potassium: 4.6 mEq/L (ref 3.7–5.3)
Sodium: 141 mEq/L (ref 137–147)

## 2013-09-02 MED ORDER — HYDROCODONE-ACETAMINOPHEN 5-325 MG PO TABS: 1.0000 | ORAL_TABLET | Freq: Four times a day (QID) | ORAL | Status: DC | PRN

## 2013-09-02 MED ORDER — ENOXAPARIN SODIUM 30 MG/0.3ML ~~LOC~~ SOLN
30.0000 mg | SUBCUTANEOUS | Status: DC
  Filled 2013-09-02: qty 0.3

## 2013-09-02 MED ORDER — FUROSEMIDE 20 MG PO TABS: 20.0000 mg | ORAL_TABLET | Freq: Every day | ORAL | Status: DC

## 2013-09-02 MED ORDER — TRAMADOL HCL 50 MG PO TABS
100.0000 mg | ORAL_TABLET | Freq: Three times a day (TID) | ORAL | Status: DC
Start: 2013-09-02 — End: 2013-11-27

## 2013-09-02 MED ORDER — ENOXAPARIN SODIUM 40 MG/0.4ML ~~LOC~~ SOLN: 40.0000 mg | SUBCUTANEOUS | Status: DC

## 2013-09-02 NOTE — Progress Notes (Signed)
PATIENT ID: Meredith Bender   4 Days Post-Op Procedure(s) (LRB): INTRAMEDULLARY (IM) NAIL INTERTROCHANTRIC (Right)  Subjective: Reports feeling well this am. Had some pain last night after PT and needed po oxy, otherwise pain well controlled.   Objective:  Filed Vitals:   09/02/13 0623  BP: 140/55  Pulse: 77  Temp: 98.2 F (36.8 C)  Resp: 18     Awake, alert, orientated  R hip dressings c/d/i  Incisions healing and appear benign, no erythema, warmth Wiggles toes, distally NVI   Labs:   Recent Labs  08/31/13 0540 09/01/13 0550  HGB 9.2* 8.9*   Recent Labs  08/31/13 0540 09/01/13 0550  WBC 11.4* 11.7*  RBC 3.20* 3.08*  HCT 28.6* 27.2*  PLT 216 235   Recent Labs  08/31/13 0540 09/01/13 0550  NA 138 138  K 4.1 4.5  CL 101 101  CO2 26 24  BUN 20 23  CREATININE 2.29* 2.48*  GLUCOSE 98 92  CALCIUM 8.3* 8.2*    Assessment and Plan: 4 day s/p right IM intertrochanteric nail fixation  Doing well, getting up with PT, pain well controlled Okay to d/c to SNF from ortho standpoint, medicine team will d/c when medically stable  lovenox 40mg  inj daily x 30 days for anticoagulation, script in chart norco for pain control prn, script in chart WBAT R LE   VTE proph: Lovenox 40mg  daily, SCDs

## 2013-09-02 NOTE — Discharge Instructions (Signed)
Discharge Instructions after Hip Surgery   You can bear weight and ambulate as tolerated on both legs Use ice on the hip intermittently over the first 48 hours after surgery.  Pain medicine has been prescribed for you.  Use your medicine liberally over the first 48 hours, and then you can begin to taper your use. You may take Extra Strength Tylenol or Tylenol only in place of the pain pills. DO NOT take ANY nonsteroidal anti-inflammatory pain medications: Advil, Motrin, Ibuprofen, Aleve, Naproxen or Naprosyn.  Take aspirin or anticoagulant medication as prescribed for 4 weeks after surgery. Please notify if allergic or sensitivity to aspirin. You may remove your dressing after two days.  You may shower 5 days after surgery. The incisions CANNOT get wet prior to 5 days. Simply allow the water to wash over the site and then pat dry. Do not rub the incisions.    Please call [phone] during normal business hours or [phone] after hours for any problems. Including the following:  - excessive redness of the incisions - drainage for more than 4 days - fever of more than 101.5 F  *Please note that pain medications will not be refilled after hours or on weekends.

## 2013-09-02 NOTE — Discharge Summary (Addendum)
Physician Discharge Summary  Meredith Bender DOB: 06/12/1926 DOA: 08/29/2013  PCP: [name], MD  Admit date: 08/29/2013 Discharge date: 09/02/2013  Time spent: 45 minutes  Recommendations for Outpatient Follow-up:  -Will be discharged to SNF today for post-hip rehab. -Will need a bedside commode and a trapeze bar.   Discharge Diagnoses:  Active Problems:   Hypertension   CKD (chronic kidney disease) stage 4, GFR 15-29 ml/min   Dyslipidemia   Hip fracture   Intertrochanteric fracture of right hip   Acute respiratory failure with hypoxia   Acute pulmonary edema   Acute on chronic diastolic CHF (congestive heart failure)   ARF (acute renal failure)   Discharge Condition: Stable and improved  Filed Weights   08/31/13 1647 09/01/13 0500 09/02/13 0623  Weight: 55.1 kg (121 lb 7.6 oz) 48.988 kg (108 lb) 48.98 kg (107 lb 15.7 oz)    History of present illness:  Meredith Bender is a 80 y.o. female history of hypertension, CVA, dyslipidemia CKD stage IV, COPD recently(07/2013) hospitalized with C. difficile and discharged to SNF/[facility] who presents with above complaints. She states in last p.m. she went to the bathroom and was about to sit on the toilet but missed it and fell on her right side. She reports that she began having CVA hip pain there after. She denies dizziness, chest pain, loss of consciousness, and no focal weakness prior to this episode. She was seen in the ED and an x-ray showed a nondisplaced intertrochanteric fracture, chest x-ray showed no acute findings. Orthopedics was consulted and admission to medicine service requested. Troponin done in the ED was negative, EKG showed normal sinus rhythm at 78 with no acute ischemic changes. she denies shortness of breath, cough and no chest pain at this time. Hospitalist admission was requested.   Hospital Course:    Right Hip Fracture  -s/p repair 08/29/13.  -management as per ortho.  -For SNF.  -Lovenox x 30  days for DVT prophylaxis.  Acute Hypoxemic Respiratory Failure  -2/2 acute diastolic CHF.  -See below for details.  -Currently does not have any oxygen requirements.  Acute Diastolic CHF  -Appears compensated at this time. -ECHO in 8/14: EF 50-55% and grade 1 diastolic dysfunction.  -Will decrease lasix to 20 mg daily given ARF.  Acute on CKD Stage IV  -Cr baseline of 1.6-1.8.  -Up to 2.48 today; suspect related to diuresis.  -Will decrease lasix to 20 mg at time of DC. -Recheck renal function in aprox 3 days.  HTN  -Well controlled.   COPD  -not acute exacerbation.  -PRN nebs.  Procedures:  Hip repar   Consultations:  Ortho  Discharge Instructions      Discharge Orders   Future Orders Complete By Expires   Diet - low sodium heart healthy  As directed    Discontinue IV  As directed    Increase activity slowly  As directed    Weight bearing as tolerated  As directed    Questions:     Laterality:     Extremity:         Medication List         atorvastatin 10 MG tablet  Commonly known as:  LIPITOR  Take 10 mg by mouth daily.     D3-1000 1000 UNITS capsule  Generic drug:  Cholecalciferol  Take 2,000 Units by mouth daily.     enoxaparin 40 MG/0.4ML injection  Commonly known as:  LOVENOX  Inject 0.4 mLs (40  mg total) into the skin daily.     furosemide 20 MG tablet  Commonly known as:  LASIX  Take 1 tablet (20 mg total) by mouth daily.     hydrALAZINE 25 MG tablet  Commonly known as:  APRESOLINE  Take 25 mg by mouth 3 (three) times daily.     HYDROcodone-acetaminophen 5-325 MG per tablet  Commonly known as:  NORCO  Take 1 tablet by mouth every 6 (six) hours as needed for moderate pain.     loratadine 10 MG tablet  Commonly known as:  CLARITIN  Take 10 mg by mouth daily.     omeprazole 20 MG capsule  Commonly known as:  PRILOSEC  Take 20 mg by mouth daily.     pravastatin 40 MG tablet  Commonly known as:  PRAVACHOL  Take 40 mg by mouth  daily.     promethazine 25 MG tablet  Commonly known as:  PHENERGAN  Take 25 mg by mouth every 6 (six) hours as needed for nausea or vomiting.     traMADol 50 MG tablet  Commonly known as:  ULTRAM  Take 2 tablets (100 mg total) by mouth 3 (three) times daily.     verapamil 240 MG 24 hr capsule  Commonly known as:  VERELAN PM  Take 1 capsule (240 mg total) by mouth at bedtime.     vitamin E 400 UNIT capsule  Generic drug:  vitamin E  Take 400 Units by mouth daily.       Allergies  Allergen Reactions  . Codeine Nausea Only  . Penicillins Rash    Pt does not remember how long ago or severity of reaction.    Follow-up Information   Follow up with Nita Sells, MD In 2 weeks.   Specialty:  Orthopedic Surgery   Contact information:   [address] [place] [address] [phone]        The results of significant diagnostics from this hospitalization (including imaging, microbiology, ancillary and laboratory) are listed below for reference.    Significant Diagnostic Studies: Dg Chest 1 View  08/29/2013   CLINICAL DATA:  Hip fracture.  EXAM: CHEST - 1 VIEW  COMPARISON:  PA and lateral chest 02/28/2013.  FINDINGS: There is marked cardiomegaly but no edema. Lungs are emphysematous but clear. No pneumothorax or pleural effusion is identified. Remote healed right clavicle fracture is noted.  IMPRESSION: No acute disease.  Cardiomegaly without edema.  Pulmonary hyperexpansion compatible with emphysema.   Electronically Signed   By: Inge Rise M.D.   On: 08/29/2013 14:26   Dg Hip Complete Right  08/29/2013   CLINICAL DATA:  [place] in bathroom one day ago with right hip pain  EXAM: RIGHT HIP - COMPLETE 2+ VIEW  COMPARISON:  None.  FINDINGS: There is a nondisplaced intertrochanteric right femur fracture with no angulation. There are no other acute abnormalities.  IMPRESSION: Nondisplaced intertrochanteric fracture   Electronically Signed   By: Skipper Cliche M.D.   On: 08/29/2013 12:32   Dg Hip Operative Right  08/29/2013   CLINICAL DATA:  Right hip fracture fixation.  EXAM: DG OPERATIVE RIGHT HIP  TECHNIQUE: A single spot fluoroscopic AP image of the right hip is submitted.  COMPARISON:  Radiographs same date.  FINDINGS: Four spot fluoroscopic images demonstrate dynamic screw and intramedullary nail fixation of the nondisplaced intertrochanteric femur fracture. The hardware appears well positioned. No complications are identified.  IMPRESSION: ORIF of intertrochanteric femur fracture without demonstrated complication.  Electronically Signed   By: Camie Patience M.D.   On: 08/29/2013 18:18   Dg Pelvis Portable  08/29/2013   CLINICAL DATA:  Postop proximal right femur ORIF  EXAM: PORTABLE PELVIS 1-2 VIEWS  COMPARISON:  08/29/2013  FINDINGS: The intra medullary rod and the compression screw are well-seated. Fracture fragments are well aligned. There is no new fracture or evidence of an operative complication.   Electronically Signed   By: Lajean Manes M.D.   On: 08/29/2013 18:59   Dg Chest Port 1 View  08/30/2013   CLINICAL DATA:  Hypoxemia, cough, history hypertension, COPD, stroke  EXAM: PORTABLE CHEST - 1 VIEW  COMPARISON:  Portable exam 7408 hr compared to 06/28/2014  FINDINGS: Enlargement of cardiac silhouette.  Atherosclerotic calcification aorta.  Mediastinal contours otherwise normal.  Pulmonary vascular congestion.  Interstitial infiltrates bilaterally question pulmonary edema.  Suspect underlying COPD.  No definite pleural effusion or pneumothorax.  Bones demineralized.  IMPRESSION: Enlargement of cardiac silhouette with pulmonary vascular congestion and question mild pulmonary edema.   Electronically Signed   By: Lavonia Dana M.D.   On: 08/30/2013 15:29   Dg C-arm 1-60 Min  08/29/2013   CLINICAL DATA:  Right hip fracture fixation.  EXAM: DG OPERATIVE RIGHT HIP  TECHNIQUE: A single spot fluoroscopic AP image of the right hip is submitted.   COMPARISON:  Radiographs same date.  FINDINGS: Four spot fluoroscopic images demonstrate dynamic screw and intramedullary nail fixation of the nondisplaced intertrochanteric femur fracture. The hardware appears well positioned. No complications are identified.  IMPRESSION: ORIF of intertrochanteric femur fracture without demonstrated complication.   Electronically Signed   By: Camie Patience M.D.   On: 08/29/2013 18:18    Microbiology: Recent Results (from the past 240 hour(s))  URINE CULTURE     Status: None   Collection Time    08/29/13  1:03 PM      Result Value Ref Range Status   Specimen Description URINE, CATHETERIZED   Final   Special Requests NONE   Final   Culture  Setup Time     Final   Value: 08/29/2013 14:46     Performed at [facility]     Final   Value: NO GROWTH     Performed at [facility]   Culture     Final   Value: NO GROWTH     Performed at [facility]   Report Status 08/30/2013 FINAL   Final  SURGICAL PCR SCREEN     Status: None   Collection Time    08/29/13  3:37 PM      Result Value Ref Range Status   MRSA, PCR NEGATIVE  NEGATIVE Final   Staphylococcus aureus NEGATIVE  NEGATIVE Final   Comment:            The Xpert SA Assay (FDA     approved for NASAL specimens     in patients over 73 years of age),     is one component of     a comprehensive surveillance     program.  Test performance has     been validated by [employer] for patients greater     than or equal to 52 year old.     It is not intended     to diagnose infection nor to     guide or monitor treatment.     Labs: Basic Metabolic Panel:  Recent Labs  Lab 08/29/13 1133 08/29/13 1351 08/31/13 0540 09/01/13 0550  NA 141 142 138 138  K 4.3 4.3 4.1 4.5  CL 104 105 101 101  CO2 24  --  26 24  GLUCOSE 105* 104* 98 92  BUN 16 15 20 23   CREATININE 1.62* 1.80* 2.29* 2.48*  CALCIUM 9.3  --  8.3* 8.2*   Liver Function Tests: No results found  for this basename: AST, ALT, ALKPHOS, BILITOT, PROT, ALBUMIN,  in the last 168 hours No results found for this basename: LIPASE, AMYLASE,  in the last 168 hours No results found for this basename: AMMONIA,  in the last 168 hours CBC:  Recent Labs Lab 08/29/13 1133 08/29/13 1351 08/31/13 0540 09/01/13 0550  WBC 10.1  --  11.4* 11.7*  NEUTROABS 8.5*  --   --   --   HGB 11.1* 11.6* 9.2* 8.9*  HCT 33.9* 34.0* 28.6* 27.2*  MCV 87.1  --  89.4 88.3  PLT 275  --  216 235   Cardiac Enzymes: No results found for this basename: CKTOTAL, CKMB, CKMBINDEX, TROPONINI,  in the last 168 hours BNP: BNP (last 3 results) No results found for this basename: PROBNP,  in the last 8760 hours CBG: No results found for this basename: GLUCAP,  in the last 168 hours     Signed:  Lelon Frohlich  [facility] Pager: [phone] 09/02/2013, 9:03 AM

## 2013-09-02 NOTE — Progress Notes (Signed)
CSW (Clinical Social Worker) prepared pt dc packet and placed with shadow chart. CSW arranged non-emergent ambulance transport. Pt, pt family, pt nurse, and facility informed. CSW signing off.  Breanne Olvera, LCSWA [phone]  

## 2013-09-02 NOTE — Progress Notes (Addendum)
CSW (Clinical Education officer, museum) spoke facility and informed that pt is ready for dc. Facility will need pt family to complete paperwork prior to pt returning. CSW called and spoke with pt daughter. Pt daughter had numerous questions that CSW answered as best possible. Pt daughter wanting to confirm that pt will have "overhead bar and bedside commode" and facility. CSW paged MD to please make sure this is included in dc summary. Pt daughter is aware she needs to contact [facility] as soon as possible to set up time to complete paperwork.  ADDENDUM: CSW has spoken with pt daughter multiple times today. Pt daughter reports that she spoke with "charge nurse" yesterday and was informed of "certain things pt will need". CSW explained that MD makes those decisions and will include all needs in dc summary. Pt daughter upset for CSW for not contacting her prior to dc summary being done and sent to facility. CSW apologized but explained that MD completes dc summary but if any changes needed to be made CSW could easily send updated copy to facility. Pt daughter still unhappy that CSW unable to provide with more information on pt needs. CSW referred pt to speak with pt nurse.  [place], [place]

## 2013-09-02 NOTE — Progress Notes (Signed)
Received phone call from daughter at SNF facility re: Lasix and Hydralazine taken together there. Check MAR here and patient receiving both without adverse effect - spoke with receiving nurse Altha Harm) there and aware of the same. To call if have further questions re: meds taken here and to check D/C summary.

## 2013-09-02 NOTE — Progress Notes (Signed)
Physical Therapy Treatment Patient Details Name: Meredith Bender MRN: [id number] DOB: 05-22-1926 Today's Date: 09/02/2013 Time: 6269-4854 PT Time Calculation (min): 26 min  PT Assessment / Plan / Recommendation  History of Present Illness 80 y/o WF was at [facility] for rehab after recent hospitalization and was attempting to sit on toilet when she fell on R side and suffered R nondisplaced intertrochanteric fracture.  Pt s/p IM nailing (08/29/13).    PT Comments   Pt progressing slowly towards physical therapy goals. Pt very slow with mobility and requires cueing for sequencing and technique. She anticipates d/c to SNF this afternoon. Pt left sitting on BSC with NT notified and sitting outside of room.   Follow Up Recommendations  SNF     Does the patient have the potential to tolerate intense rehabilitation     Barriers to Discharge        Equipment Recommendations  None recommended by PT    Recommendations for Other Services    Frequency Min 3X/week   Progress towards PT Goals Progress towards PT goals: Progressing toward goals  Plan Current plan remains appropriate    Precautions / Restrictions Precautions Precautions: Fall Restrictions Weight Bearing Restrictions: Yes RLE Weight Bearing: Weight bearing as tolerated   Pertinent Vitals/Pain Pt reports minimal pain at rest, and 6/10 pain during ambulation.     Mobility  Bed Mobility Overal bed mobility: Needs Assistance Bed Mobility: Supine to Sit Supine to sit: Min assist General bed mobility comments: VC's for sequencing and technique. Assist for movement and support of RLE towards EOB and for trunk support while transitioning to full sit.  Transfers Overall transfer level: Needs assistance Equipment used: Rolling walker (2 wheeled) Transfers: Sit to/from Stand Sit to Stand: Mod assist General transfer comment: VC's for hand placement on seated surface.  Ambulation/Gait Ambulation/Gait assistance: Min assist Ambulation  Distance (Feet): 4 Feet Assistive device: Rolling walker (2 wheeled) Gait Pattern/deviations: Step-to pattern Gait velocity: Decreased Gait velocity interpretation: Below normal speed for age/gender General Gait Details: VC's for sequencing and safety awareness with the RW.     Exercises General Exercises - Lower Extremity Long Arc Quad: 10 reps;Right   PT Diagnosis:    PT Problem List:   PT Treatment Interventions:     PT Goals (current goals can now be found in the care plan section) Acute Rehab PT Goals Patient Stated Goal: Son's goal to get pt up and moving as much as possible PT Goal Formulation: With patient Time For Goal Achievement: 09/13/13 Potential to Achieve Goals: Good  Visit Information  Last PT Received On: 09/02/13 History of Present Illness: 80 y/o WF was at [facility] for rehab after recent hospitalization and was attempting to sit on toilet when she fell on R side and suffered R nondisplaced intertrochanteric fracture.  Pt s/p IM nailing (08/29/13).     Subjective Data  Subjective: "I think I need to use the bed pan." Patient Stated Goal: Son's goal to get pt up and moving as much as possible   Cognition  Cognition Arousal/Alertness: Awake/alert Behavior During Therapy: WFL for tasks assessed/performed Overall Cognitive Status: Within Functional Limits for tasks assessed    Balance  Balance Overall balance assessment: History of Falls  End of Session PT - End of Session Equipment Utilized During Treatment: Gait belt Activity Tolerance: Patient limited by fatigue Patient left: Other (comment) (On BSC with NT notified and present outside of room) Nurse Communication: Mobility status   GP     Jolyn Lent  09/02/2013, 3:23 PM  Jolyn Lent, [place], DPT [phone]

## 2013-09-04 DIAGNOSIS — I5030 Unspecified diastolic (congestive) heart failure: Secondary | ICD-10-CM | POA: Diagnosis not present

## 2013-09-04 DIAGNOSIS — A0472 Enterocolitis due to Clostridium difficile, not specified as recurrent: Secondary | ICD-10-CM | POA: Diagnosis not present

## 2013-09-04 DIAGNOSIS — R079 Chest pain, unspecified: Secondary | ICD-10-CM | POA: Diagnosis not present

## 2013-09-04 DIAGNOSIS — N189 Chronic kidney disease, unspecified: Secondary | ICD-10-CM | POA: Diagnosis not present

## 2013-09-04 DIAGNOSIS — R1900 Intra-abdominal and pelvic swelling, mass and lump, unspecified site: Secondary | ICD-10-CM | POA: Diagnosis not present

## 2013-09-04 DIAGNOSIS — S72009A Fracture of unspecified part of neck of unspecified femur, initial encounter for closed fracture: Secondary | ICD-10-CM | POA: Diagnosis not present

## 2013-09-04 DIAGNOSIS — N184 Chronic kidney disease, stage 4 (severe): Secondary | ICD-10-CM | POA: Diagnosis not present

## 2013-09-04 DIAGNOSIS — I10 Essential (primary) hypertension: Secondary | ICD-10-CM | POA: Diagnosis not present

## 2013-09-05 DIAGNOSIS — R079 Chest pain, unspecified: Secondary | ICD-10-CM | POA: Diagnosis not present

## 2013-09-05 DIAGNOSIS — N184 Chronic kidney disease, stage 4 (severe): Secondary | ICD-10-CM | POA: Diagnosis not present

## 2013-09-05 DIAGNOSIS — A0472 Enterocolitis due to Clostridium difficile, not specified as recurrent: Secondary | ICD-10-CM | POA: Diagnosis not present

## 2013-09-05 DIAGNOSIS — R1900 Intra-abdominal and pelvic swelling, mass and lump, unspecified site: Secondary | ICD-10-CM | POA: Diagnosis not present

## 2013-09-05 NOTE — H&P (Signed)
H&P done on 2/12 but erroneously labeled a progress note. Meredith Oats, MD Physician Signed Internal Medicine Progress Notes Service date: 08/29/2013 4:19 PM  [facility] History and Physical   Meredith Bender [id number] DOB: 08/23/1925 DOA: 08/29/2013   Referring physician: EDP PCP: Meredith Mcmurray, MD    Chief Complaint: Right hip pain, status post fall   HPI: Meredith Bender is a 80 y.o. female history of hypertension, CVA, dyslipidemia CKD stage IV, COPD recently(07/2013) hospitalized with C. difficile and discharged to SNF/[facility] who presents with above complaints. She states in last p.m. she went to the bathroom and was about to sit on the toilet but missed it and fell on her right side. She reports that she began having CVA hip pain there after. She denies dizziness, chest pain, loss of consciousness, and no focal weakness prior to this episode. She was seen in the ED and an x-ray showed a nondisplaced intertrochanteric fracture, chest x-ray showed no acute findings. Orthopedics was consulted and admission to medicine service requested. Troponin done in the ED was negative, EKG showed normal sinus rhythm at 78 with no acute ischemic changes. she denies shortness of breath, cough and no chest pain at this time.       Review of Systems The patient denies anorexia, fever, weight loss, vision loss, decreased hearing, hoarseness, chest pain, syncope, dyspnea on exertion, peripheral edema, hemoptysis, abdominal pain, melena, hematochezia, severe indigestion/heartburn, hematuria, incontinence, genital sores, muscle weakness, suspicious skin lesions, transient blindness, difficulty walking, depression, unusual weight change, abnormal bleeding,     Past Medical History   Diagnosis  Date   .  Hypertension     .  CVA (cerebral infarction)         Remote bilateral cerebellar infarcts on CT 8/14   .  Dyslipidemia  02/28/2013   .  Anginal pain     .  Chronic kidney disease     .   Arthritis     .  COPD (chronic obstructive pulmonary disease)     .  Stroke  1978       no deficits    Past Surgical History   Procedure  Laterality  Date   .  Hernia repair       .  Appendectomy       .  Tubal ligation       .  Abdominal hysterectomy        Social History: reports that she has never smoked. She does not have any smokeless tobacco history on file. She reports that she does not drink alcohol or use illicit drugs.    Allergies   Allergen  Reactions   .  Codeine  Nausea Only   .  Penicillins  Rash       Pt does not remember how long ago or severity of reaction.       No family history on file.     Prior to Admission medications    Medication  Sig  Start Date  End Date  Taking?  Authorizing Provider   atorvastatin (LIPITOR) 10 MG tablet  Take 10 mg by mouth daily.        Yes  Historical Provider, MD   Cholecalciferol (D3-1000) 1000 UNITS capsule  Take 2,000 Units by mouth daily.      Yes  Historical Provider, MD   hydrALAZINE (APRESOLINE) 25 MG tablet  Take 25 mg by mouth 3 (three) times daily.      Yes  Historical Provider, MD   loratadine (CLARITIN) 10 MG tablet  Take 10 mg by mouth daily.        Yes  Historical Provider, MD   omeprazole (PRILOSEC) 20 MG capsule  Take 20 mg by mouth daily.        Yes  Historical Provider, MD   pravastatin (PRAVACHOL) 40 MG tablet  Take 40 mg by mouth daily.      Yes  Historical Provider, MD   promethazine (PHENERGAN) 25 MG tablet  Take 25 mg by mouth every 6 (six) hours as needed for nausea or vomiting.      Yes  Historical Provider, MD   traMADol (ULTRAM) 50 MG tablet  Take 100 mg by mouth 3 (three) times daily.       Yes  Historical Provider, MD   verapamil (VERELAN PM) 240 MG 24 hr capsule  Take 1 capsule (240 mg total) by mouth at bedtime.  06/12/13    Yes  [name], MD   vitamin E (VITAMIN E) 400 UNIT capsule  Take 400 Units by mouth daily.        Yes  Historical Provider, MD      Physical Exam: Filed Vitals:      08/29/13 1500   BP:  159/52   Pulse:  73   Temp:  97.6 F (36.4 C)   Resp:  20      BP 159/52  Pulse 73  Temp(Src) 97.6 F (36.4 C) (Oral)  Resp 20  SpO2 92% Constitutional: Vital signs reviewed.  Patient is a well-developed and well-nourished  in no acute distress and cooperative with exam. Alert and oriented x3.   Head: Normocephalic and atraumatic Mouth: no erythema or exudates, MMM Eyes: PERRL, EOMI, conjunctivae normal, No scleral icterus.  Neck: Supple, Trachea midline normal ROM, No JVD, mass, thyromegaly, or carotid bruit present.  Cardiovascular: RRR, S1 normal, S2 normal, no MRG, pulses symmetric and intact bilaterally Pulmonary/Chest: normal respiratory effort, moderate air movement, no wheezes, rales, or rhonchi Abdominal: Soft. Non-tender, non-distended, bowel sounds are normal, no masses, organomegaly, or guarding present.   GU: no CVA tenderness Extremities: No cyanosis and no edema  Neurological: A&O x3, Strength is normal and symmetric bilaterally, cranial nerve II-XII are grossly intact, no focal motor deficit, sensory intact to light touch bilaterally.  Skin: Warm, dry and intact. No rash, cyanosis, or clubbing.  Psychiatric: Normal mood and affect. speech and behavior is normal.                           Labs on Admission:  Basic Metabolic Panel: Recent Labs Lab  08/29/13 1133  08/29/13 1351   NA  141  142   K  4.3  4.3   CL  104  105   CO2  24   --    GLUCOSE  105*  104*   BUN  16  15   CREATININE  1.62*  1.80*   CALCIUM  9.3   --     Liver Function Tests: No results found for this basename: AST, ALT, ALKPHOS, BILITOT, PROT, ALBUMIN,  in the last 168 hours No results found for this basename: LIPASE, AMYLASE,  in the last 168 hours No results found for this basename: AMMONIA,  in the last 168 hours CBC: Recent Labs Lab  08/29/13 1133  08/29/13 1351   WBC  10.1   --    NEUTROABS  8.5*   --  HGB  11.1*  11.6*   HCT  33.9*  34.0*    MCV  87.1   --    PLT  275   --     Cardiac Enzymes: No results found for this basename: CKTOTAL, CKMB, CKMBINDEX, TROPONINI,  in the last 168 hours   BNP (last 3 results) No results found for this basename: PROBNP,  in the last 8760 hours CBG: No results found for this basename: GLUCAP,  in the last 168 hours   Radiological Exams on Admission: Dg Chest 1 View   08/29/2013   CLINICAL DATA:  Hip fracture.  EXAM: CHEST - 1 VIEW  COMPARISON:  PA and lateral chest 02/28/2013.  FINDINGS: There is marked cardiomegaly but no edema. Lungs are emphysematous but clear. No pneumothorax or pleural effusion is identified. Remote healed right clavicle fracture is noted.  IMPRESSION: No acute disease.  Cardiomegaly without edema.  Pulmonary hyperexpansion compatible with emphysema.   Electronically Signed   By: Inge Rise M.D.   On: 08/29/2013 14:26    Dg Hip Complete Right   08/29/2013   CLINICAL DATA:  [place] in bathroom one day ago with right hip pain  EXAM: RIGHT HIP - COMPLETE 2+ VIEW  COMPARISON:  None.  FINDINGS: There is a nondisplaced intertrochanteric right femur fracture with no angulation. There are no other acute abnormalities.  IMPRESSION: Nondisplaced intertrochanteric fracture   Electronically Signed   By: Skipper Cliche M.D.   On: 08/29/2013 12:32       Assessment/Plan Active Problems:  Present on Admission:  Present on Admission:  . right Hip fracture -As discussed above, hip x-rays with nondisplaced intertrochanteric fracture   -Orthopedics consulted per EDP and patient taken to surgery today per Dr. Tamera Punt   -Start on IV analgesics for Pain management  . CKD (chronic kidney disease) stage 4 -Stable, creatinine 1.8 today (from 1.6 on 07/25/13, her baseline reported to be around 2) -Followed by Dr. Posey Pronto  . Hypertension -Continue outpatient medications  . Dyslipidemia -Continue outpatient medications . History of COPD -Stable, oxygenating well on room air. Place on  When necessary bronchodilators . History of incidental 3 cm ovarian cyst/mass -[facility] on [facility]             Code Status: full Family Communication: none at bedside Disposition Plan: admit to med surge   Time spent: >30   [facility] Pager [phone]

## 2013-09-10 ENCOUNTER — Emergency Department (HOSPITAL_COMMUNITY): Payer: Medicare Other

## 2013-09-10 ENCOUNTER — Encounter (HOSPITAL_COMMUNITY): Payer: Self-pay | Admitting: Emergency Medicine

## 2013-09-10 ENCOUNTER — Emergency Department (HOSPITAL_COMMUNITY)
Admission: EM | Admit: 2013-09-10 | Discharge: 2013-09-10 | Disposition: A | Discharge status: Home / Self Care | Payer: Medicare Other | Attending: Emergency Medicine | Admitting: Emergency Medicine

## 2013-09-10 DIAGNOSIS — E785 Hyperlipidemia, unspecified: Secondary | ICD-10-CM | POA: Insufficient documentation

## 2013-09-10 DIAGNOSIS — J449 Chronic obstructive pulmonary disease, unspecified: Secondary | ICD-10-CM | POA: Insufficient documentation

## 2013-09-10 DIAGNOSIS — I129 Hypertensive chronic kidney disease with stage 1 through stage 4 chronic kidney disease, or unspecified chronic kidney disease: Secondary | ICD-10-CM | POA: Insufficient documentation

## 2013-09-10 DIAGNOSIS — G8918 Other acute postprocedural pain: Secondary | ICD-10-CM | POA: Diagnosis not present

## 2013-09-10 DIAGNOSIS — IMO0002 Reserved for concepts with insufficient information to code with codable children: Secondary | ICD-10-CM | POA: Diagnosis not present

## 2013-09-10 DIAGNOSIS — M25559 Pain in unspecified hip: Secondary | ICD-10-CM | POA: Insufficient documentation

## 2013-09-10 DIAGNOSIS — Z79899 Other long term (current) drug therapy: Secondary | ICD-10-CM | POA: Insufficient documentation

## 2013-09-10 DIAGNOSIS — Z8673 Personal history of transient ischemic attack (TIA), and cerebral infarction without residual deficits: Secondary | ICD-10-CM | POA: Insufficient documentation

## 2013-09-10 DIAGNOSIS — I10 Essential (primary) hypertension: Secondary | ICD-10-CM | POA: Diagnosis not present

## 2013-09-10 DIAGNOSIS — I209 Angina pectoris, unspecified: Secondary | ICD-10-CM | POA: Insufficient documentation

## 2013-09-10 DIAGNOSIS — N189 Chronic kidney disease, unspecified: Secondary | ICD-10-CM | POA: Insufficient documentation

## 2013-09-10 DIAGNOSIS — M129 Arthropathy, unspecified: Secondary | ICD-10-CM | POA: Insufficient documentation

## 2013-09-10 DIAGNOSIS — Z8781 Personal history of (healed) traumatic fracture: Secondary | ICD-10-CM | POA: Insufficient documentation

## 2013-09-10 DIAGNOSIS — Z880 Allergy status to penicillin: Secondary | ICD-10-CM | POA: Insufficient documentation

## 2013-09-10 DIAGNOSIS — Z794 Long term (current) use of insulin: Secondary | ICD-10-CM | POA: Insufficient documentation

## 2013-09-10 DIAGNOSIS — J4489 Other specified chronic obstructive pulmonary disease: Secondary | ICD-10-CM | POA: Insufficient documentation

## 2013-09-10 DIAGNOSIS — S72143A Displaced intertrochanteric fracture of unspecified femur, initial encounter for closed fracture: Secondary | ICD-10-CM | POA: Diagnosis not present

## 2013-09-10 HISTORY — DX: Displaced intertrochanteric fracture of right femur, initial encounter for closed fracture: S72.141A

## 2013-09-10 MED ORDER — HYDROCODONE-ACETAMINOPHEN 5-325 MG PO TABS
1.0000 | ORAL_TABLET | Freq: Once | ORAL | Status: AC
  Administered 2013-09-10: 1 via ORAL
  Filled 2013-09-10: qty 1

## 2013-09-10 NOTE — ED Notes (Signed)
Pt from [facility] via [facility] for c/o of increasing hip pain.  Pt fell on 08/28/13, seen on 08/29/13 dx with nondisplaced intertrochanteric fracture.  Pt states she was able to bear weight on the leg and walk on it the following week after surgery, but this week the pain is increasing and too great to walk.  Repeat x-ray done at the facility on 09/09/13 showing healing of the previous fracture, with no acute changes since 08/29/13.  Physical therapist sent pt to be reevaluated for increasing pain.  Pt in NAD, A&O.

## 2013-09-10 NOTE — Discharge Instructions (Signed)
Please give Meredith Bender the Norco she has ordered for breakthrough pain. It seems to work well for her. Resume PT on Friday. Follow up with Dr. Tamera Punt as scheduled  Pain Relief Preoperatively and Postoperatively Being a good patient does not mean being a silent one.If you have questions, problems, or concerns about the pain you may feel after surgery, let your caregiver know.Patients have the right to assessment and management of pain. The treatment of pain after surgery is important to speed up recovery and return to normal activities. Severe pain after surgery, and the fear or anxiety associated with that pain, may cause extreme discomfort that:  Prevents sleep.  Decreases the ability to breathe deeply and cough. This can cause pneumonia or other upper airway infections.  Causes your heart to beat faster and your blood pressure to be higher.  Increases the risk for constipation and bloating.  Decreases the ability of wounds to heal.  May result in depression, increased anxiety, and feelings of helplessness. Relief of pain before surgery is also important because it will lessen the pain after surgery. Patients who receive both pain relief before and after surgery experience greater pain relief than those who only receive pain relief after surgery. Let your caregiver know if you are having uncontrolled pain.This is very important.Pain after surgery is more difficult to manage if it is permitted to become severe, so prompt and adequate treatment of acute pain is necessary. PAIN CONTROL METHODS Your caregivers follow policies and procedures about the management of patient pain.These guidelines should be explained to you before surgery.Plans for pain control after surgery must be mutually decided upon and instituted with your full understanding and agreement.Do not be afraid to ask questions regarding the care you are receiving.There are many different ways your caregivers will attempt to  control your pain, including the following methods. As needed pain control  You may be given pain medicine either through your intravenous (IV) tube, or as a pill or liquid you can swallow. You will need to let your caregiver know when you are having pain. Then, your caregiver will give you the pain medicine ordered for you.  Your pain medicine may make you constipated. If constipation occurs, drink more liquids if you can. Your caregiver may have you take a mild laxative. IV patient-controlled analgesia pump (PCA pump)  You can get your pain medicine through the IV tube which goes into your vein. You are able to control the amount of pain medicine that you get. The pain medicine flows in through an IV tube and is controlled by a pump. This pump gives you a set amount of pain medicine when you push the button hooked up to it. Nobody should push this button but you or someone specifically assigned by you to do so. It is set up to keep you from accidentally giving yourself too much pain medicine. You will be able to start using your pain pump in the recovery room after your surgery. This method can be helpful for most types of surgery.  If you are still having too much pain, tell your caregiver. Also, tell your caregiver if you are feeling too sleepy or nauseous. Continuous epidural pain control  A thin, soft tube (catheter) is put into your back. Pain medicine flows through the catheter to lessen pain in the part of your body where the surgery is done. Continuous epidural pain control may work best for you if you are having surgery on your chest, abdomen, hip area,  or legs. The epidural catheter is usually put into your back just before surgery. The catheter is left in until you can eat and take medicine by mouth. In most cases, this may take 2 to 3 days.  Giving pain medicine through the epidural catheter may help you heal faster because:  Your bowel gets back to normal faster.  You can get back  to eating sooner.  You can be up and walking sooner. Medicine that numbs the area (local anesthetic)  You may receive an injection of pain medicine near where the pain is (local infiltration).  You may receive an injection of pain medicine near the nerve that controls the sensation to a specific part of the body (peripheral nerve block).  Medicine may be put in the spine to block pain (spinal block). Opioids  Moderate to moderately severe acute pain after surgery may respond to opioids.Opioids are narcotic pain medicine. Opioids are often combined with non-narcotic medicines to improve pain relief, diminish the risk of side effects, and reduce the chance of addiction.  If you follow your caregiver's directions about taking opioids and you do not have a history of substance abuse, your risk of becoming addicted is exceptionally small.Opioids are given for short periods of time in careful doses to prevent addiction. Other methods of pain control include:  Steroids.  Physical therapy.  Heat and cold therapy.  Compression, such as wrapping an elastic bandage around the area of pain.  Massage. These various ways of controlling pain may be used together. Combining different methods of pain control is called multimodal analgesia. Using this approach has many benefits, including being able to eat, move around, and leave the hospital sooner. Document Released: 09/24/2002 Document Revised: 09/26/2011 Document Reviewed: 09/28/2010 [facility] Patient Information 2014 [place].

## 2013-09-10 NOTE — ED Provider Notes (Signed)
CSN: [id number]     Arrival date & time 09/10/13  1134 History   First MD Initiated Contact with Patient 09/10/13 1136     Chief Complaint  Patient presents with  . Hip Pain     (Consider location/radiation/quality/duration/timing/severity/associated sxs/prior Treatment) Patient is a 80 y.o. female presenting with hip pain.  Hip Pain   Pt is a very pleasant 80yo from [facility] for evaluation of R hip pain. She had a fall about 2 weeks ago with an intertrochanteric fracture which was repaired. She was doing well with PT post-op until about a week ago when she reports increased pain and difficulty participating in therapy. She has not had any new falls or injuries. No fever. She states pain is in R hip and radiates down to R knee and R lower back. Worse with movement and difficulty standing due to pain. She had an xray done at [facility] yesterday which showed normal post-op findings, no new fractures or failure of ORIF. She was sent to the ED today for further eval.   Past Medical History  Diagnosis Date  . Hypertension   . CVA (cerebral infarction)     Remote bilateral cerebellar infarcts on CT 8/14  . Dyslipidemia 02/28/2013  . Anginal pain   . Chronic kidney disease   . Arthritis   . COPD (chronic obstructive pulmonary disease)   . Stroke 1978    no deficits  . Intertrochanteric fracture of right femur    Past Surgical History  Procedure Laterality Date  . Hernia repair    . Appendectomy    . Tubal ligation    . Abdominal hysterectomy    . Intramedullary (im) nail intertrochanteric Right 08/29/2013    Procedure: INTRAMEDULLARY (IM) NAIL INTERTROCHANTRIC;  Surgeon: Nita Sells, MD;  Location: [place];  Service: Orthopedics;  Laterality: Right;   History reviewed. No pertinent family history. History  Substance Use Topics  . Smoking status: Never Smoker   . Smokeless tobacco: Not on file  . Alcohol Use: No   OB History   Grav Para Term Preterm Abortions TAB SAB Ect  Mult Living                 Review of Systems All other systems reviewed and are negative except as noted in HPI.     Allergies  Codeine and Penicillins  Home Medications   Current Outpatient Rx  Name  Route  Sig  Dispense  Refill  . atorvastatin (LIPITOR) 10 MG tablet   Oral   Take 10 mg by mouth daily.           . Cholecalciferol (D3-1000) 1000 UNITS capsule   Oral   Take 2,000 Units by mouth daily.         Marland Kitchen enoxaparin (LOVENOX) 40 MG/0.4ML injection   Subcutaneous   Inject 0.4 mLs (40 mg total) into the skin daily.   25 Syringe   0   . furosemide (LASIX) 20 MG tablet   Oral   Take 1 tablet (20 mg total) by mouth daily.   30 tablet      . hydrALAZINE (APRESOLINE) 25 MG tablet   Oral   Take 25 mg by mouth 3 (three) times daily.         Marland Kitchen HYDROcodone-acetaminophen (NORCO) 5-325 MG per tablet   Oral   Take 1 tablet by mouth every 6 (six) hours as needed for moderate pain.   30 tablet   0   . loratadine (CLARITIN)  10 MG tablet   Oral   Take 10 mg by mouth daily.           Marland Kitchen omeprazole (PRILOSEC) 20 MG capsule   Oral   Take 20 mg by mouth daily.           . pravastatin (PRAVACHOL) 40 MG tablet   Oral   Take 40 mg by mouth daily.         . promethazine (PHENERGAN) 25 MG tablet   Oral   Take 25 mg by mouth every 6 (six) hours as needed for nausea or vomiting.         . traMADol (ULTRAM) 50 MG tablet   Oral   Take 2 tablets (100 mg total) by mouth 3 (three) times daily.   30 tablet   0   . verapamil (VERELAN PM) 240 MG 24 hr capsule   Oral   Take 1 capsule (240 mg total) by mouth at bedtime.         . vitamin E (VITAMIN E) 400 UNIT capsule   Oral   Take 400 Units by mouth daily.            BP 155/69  Pulse 69  Temp(Src) 98 F (36.7 C)  Resp 20  SpO2 96% Physical Exam  Nursing note and vitals reviewed. Constitutional: She is oriented to person, place, and time. She appears well-developed and well-nourished.  HENT:   Head: Normocephalic and atraumatic.  Eyes: EOM are normal. Pupils are equal, round, and reactive to light.  Neck: Normal range of motion. Neck supple.  Cardiovascular: Normal rate, normal heart sounds and intact distal pulses.   Pulmonary/Chest: Effort normal and breath sounds normal.  Abdominal: Bowel sounds are normal. She exhibits no distension. There is no tenderness.  Musculoskeletal: Normal range of motion. She exhibits no edema and no tenderness.  Pain with ROM, particularly extension of R hip, but able to flex hip and knee past 90deg. Surgical incisions healing well without signs of infection  Neurological: She is alert and oriented to person, place, and time. She has normal strength. No cranial nerve deficit or sensory deficit.  Skin: Skin is warm and dry. No rash noted.  Psychiatric: She has a normal mood and affect.    ED Course  Procedures (including critical care time) Labs Review Labs Reviewed - No data to display Imaging Review Dg Femur Right  09/10/2013   CLINICAL DATA:  ORIF right hip fracture  EXAM: RIGHT FEMUR - 2 VIEW  COMPARISON:  Preoperative radiographs 08/29/2013  FINDINGS: Interval surgical changes of ORIF of the nondisplaced right intertrochanteric fracture with an intra medullary nail and cannulated femoral neck screw. No evidence of immediate hardware complication. The visualized bony pelvis appears intact and unremarkable. The bones are osteopenic. No lytic or blastic osseous lesion. Scattered atherosclerotic calcifications are noted in the superficial femoral artery.  IMPRESSION: ORIF of right intertrochanteric fracture without evidence of immediate hardware complication.  Atherosclerotic vascular calcifications.   Electronically Signed   By: Jacqulynn Cadet M.D.   On: 09/10/2013 13:08    EKG Interpretation   None       MDM   Final diagnoses:  Post-operative pain    I spoke with Dr. Tamera Punt who requested femur xray to eval for occult fracture  distally. It was neg as above. Pt has Tramadol (which she has been getting regularly) as well as Norco (which she has been getting sparingly) on her MAR from SNF. She was given a  Norco here with good relief of pain. Advised to rest for a couple of day before resuming PT. Followup with Dr. Tamera Punt as scheduled.     Shaquella Stamant B. Karle Starch, MD 09/10/13 1322

## 2013-09-11 DIAGNOSIS — R1900 Intra-abdominal and pelvic swelling, mass and lump, unspecified site: Secondary | ICD-10-CM | POA: Diagnosis not present

## 2013-09-11 DIAGNOSIS — R079 Chest pain, unspecified: Secondary | ICD-10-CM | POA: Diagnosis not present

## 2013-09-11 DIAGNOSIS — A0472 Enterocolitis due to Clostridium difficile, not specified as recurrent: Secondary | ICD-10-CM | POA: Diagnosis not present

## 2013-09-11 DIAGNOSIS — N184 Chronic kidney disease, stage 4 (severe): Secondary | ICD-10-CM | POA: Diagnosis not present

## 2013-09-18 DIAGNOSIS — S72143A Displaced intertrochanteric fracture of unspecified femur, initial encounter for closed fracture: Secondary | ICD-10-CM | POA: Diagnosis not present

## 2013-09-27 DIAGNOSIS — F0390 Unspecified dementia without behavioral disturbance: Secondary | ICD-10-CM | POA: Diagnosis not present

## 2013-09-27 DIAGNOSIS — S72009A Fracture of unspecified part of neck of unspecified femur, initial encounter for closed fracture: Secondary | ICD-10-CM | POA: Diagnosis not present

## 2013-09-27 DIAGNOSIS — I10 Essential (primary) hypertension: Secondary | ICD-10-CM | POA: Diagnosis not present

## 2013-09-27 DIAGNOSIS — N189 Chronic kidney disease, unspecified: Secondary | ICD-10-CM | POA: Diagnosis not present

## 2013-10-04 ENCOUNTER — Telehealth: Payer: Self-pay | Admitting: *Deleted

## 2013-10-04 NOTE — Telephone Encounter (Signed)
LM for patient to call back.  Would like to find out if she received a flu shot this year and if so where and when.  If not does she want to receive one here in the office. Jazmin Hartsell,CMA

## 2013-10-08 DIAGNOSIS — I129 Hypertensive chronic kidney disease with stage 1 through stage 4 chronic kidney disease, or unspecified chronic kidney disease: Secondary | ICD-10-CM | POA: Diagnosis not present

## 2013-10-08 DIAGNOSIS — N184 Chronic kidney disease, stage 4 (severe): Secondary | ICD-10-CM | POA: Diagnosis not present

## 2013-10-08 DIAGNOSIS — N2581 Secondary hyperparathyroidism of renal origin: Secondary | ICD-10-CM | POA: Diagnosis not present

## 2013-10-08 DIAGNOSIS — D631 Anemia in chronic kidney disease: Secondary | ICD-10-CM | POA: Diagnosis not present

## 2013-10-14 DIAGNOSIS — S72143A Displaced intertrochanteric fracture of unspecified femur, initial encounter for closed fracture: Secondary | ICD-10-CM | POA: Diagnosis not present

## 2013-10-16 DIAGNOSIS — F0390 Unspecified dementia without behavioral disturbance: Secondary | ICD-10-CM | POA: Diagnosis not present

## 2013-10-16 DIAGNOSIS — N189 Chronic kidney disease, unspecified: Secondary | ICD-10-CM | POA: Diagnosis not present

## 2013-10-16 DIAGNOSIS — S72009A Fracture of unspecified part of neck of unspecified femur, initial encounter for closed fracture: Secondary | ICD-10-CM | POA: Diagnosis not present

## 2013-10-16 DIAGNOSIS — I10 Essential (primary) hypertension: Secondary | ICD-10-CM | POA: Diagnosis not present

## 2013-11-12 DIAGNOSIS — S72009D Fracture of unspecified part of neck of unspecified femur, subsequent encounter for closed fracture with routine healing: Secondary | ICD-10-CM | POA: Diagnosis not present

## 2013-11-12 DIAGNOSIS — H8110 Benign paroxysmal vertigo, unspecified ear: Secondary | ICD-10-CM | POA: Diagnosis not present

## 2013-11-12 DIAGNOSIS — J441 Chronic obstructive pulmonary disease with (acute) exacerbation: Secondary | ICD-10-CM | POA: Diagnosis not present

## 2013-11-12 DIAGNOSIS — N184 Chronic kidney disease, stage 4 (severe): Secondary | ICD-10-CM | POA: Diagnosis not present

## 2013-11-12 DIAGNOSIS — I129 Hypertensive chronic kidney disease with stage 1 through stage 4 chronic kidney disease, or unspecified chronic kidney disease: Secondary | ICD-10-CM | POA: Diagnosis not present

## 2013-11-12 DIAGNOSIS — I509 Heart failure, unspecified: Secondary | ICD-10-CM | POA: Diagnosis not present

## 2013-11-13 DIAGNOSIS — I129 Hypertensive chronic kidney disease with stage 1 through stage 4 chronic kidney disease, or unspecified chronic kidney disease: Secondary | ICD-10-CM | POA: Diagnosis not present

## 2013-11-13 DIAGNOSIS — J441 Chronic obstructive pulmonary disease with (acute) exacerbation: Secondary | ICD-10-CM | POA: Diagnosis not present

## 2013-11-13 DIAGNOSIS — N184 Chronic kidney disease, stage 4 (severe): Secondary | ICD-10-CM | POA: Diagnosis not present

## 2013-11-13 DIAGNOSIS — S72009D Fracture of unspecified part of neck of unspecified femur, subsequent encounter for closed fracture with routine healing: Secondary | ICD-10-CM | POA: Diagnosis not present

## 2013-11-13 DIAGNOSIS — H8110 Benign paroxysmal vertigo, unspecified ear: Secondary | ICD-10-CM | POA: Diagnosis not present

## 2013-11-13 DIAGNOSIS — I509 Heart failure, unspecified: Secondary | ICD-10-CM | POA: Diagnosis not present

## 2013-11-14 DIAGNOSIS — N184 Chronic kidney disease, stage 4 (severe): Secondary | ICD-10-CM | POA: Diagnosis not present

## 2013-11-14 DIAGNOSIS — S72009D Fracture of unspecified part of neck of unspecified femur, subsequent encounter for closed fracture with routine healing: Secondary | ICD-10-CM | POA: Diagnosis not present

## 2013-11-14 DIAGNOSIS — I509 Heart failure, unspecified: Secondary | ICD-10-CM | POA: Diagnosis not present

## 2013-11-14 DIAGNOSIS — H8110 Benign paroxysmal vertigo, unspecified ear: Secondary | ICD-10-CM | POA: Diagnosis not present

## 2013-11-14 DIAGNOSIS — I129 Hypertensive chronic kidney disease with stage 1 through stage 4 chronic kidney disease, or unspecified chronic kidney disease: Secondary | ICD-10-CM | POA: Diagnosis not present

## 2013-11-14 DIAGNOSIS — J441 Chronic obstructive pulmonary disease with (acute) exacerbation: Secondary | ICD-10-CM | POA: Diagnosis not present

## 2013-11-15 DIAGNOSIS — N184 Chronic kidney disease, stage 4 (severe): Secondary | ICD-10-CM | POA: Diagnosis not present

## 2013-11-15 DIAGNOSIS — S72009D Fracture of unspecified part of neck of unspecified femur, subsequent encounter for closed fracture with routine healing: Secondary | ICD-10-CM | POA: Diagnosis not present

## 2013-11-15 DIAGNOSIS — J441 Chronic obstructive pulmonary disease with (acute) exacerbation: Secondary | ICD-10-CM | POA: Diagnosis not present

## 2013-11-15 DIAGNOSIS — I509 Heart failure, unspecified: Secondary | ICD-10-CM | POA: Diagnosis not present

## 2013-11-15 DIAGNOSIS — I129 Hypertensive chronic kidney disease with stage 1 through stage 4 chronic kidney disease, or unspecified chronic kidney disease: Secondary | ICD-10-CM | POA: Diagnosis not present

## 2013-11-15 DIAGNOSIS — H8110 Benign paroxysmal vertigo, unspecified ear: Secondary | ICD-10-CM | POA: Diagnosis not present

## 2013-11-18 DIAGNOSIS — N184 Chronic kidney disease, stage 4 (severe): Secondary | ICD-10-CM | POA: Diagnosis not present

## 2013-11-18 DIAGNOSIS — S72009D Fracture of unspecified part of neck of unspecified femur, subsequent encounter for closed fracture with routine healing: Secondary | ICD-10-CM | POA: Diagnosis not present

## 2013-11-18 DIAGNOSIS — I129 Hypertensive chronic kidney disease with stage 1 through stage 4 chronic kidney disease, or unspecified chronic kidney disease: Secondary | ICD-10-CM | POA: Diagnosis not present

## 2013-11-18 DIAGNOSIS — H8110 Benign paroxysmal vertigo, unspecified ear: Secondary | ICD-10-CM | POA: Diagnosis not present

## 2013-11-18 DIAGNOSIS — J441 Chronic obstructive pulmonary disease with (acute) exacerbation: Secondary | ICD-10-CM | POA: Diagnosis not present

## 2013-11-18 DIAGNOSIS — I509 Heart failure, unspecified: Secondary | ICD-10-CM | POA: Diagnosis not present

## 2013-11-19 DIAGNOSIS — S72009D Fracture of unspecified part of neck of unspecified femur, subsequent encounter for closed fracture with routine healing: Secondary | ICD-10-CM | POA: Diagnosis not present

## 2013-11-19 DIAGNOSIS — N184 Chronic kidney disease, stage 4 (severe): Secondary | ICD-10-CM | POA: Diagnosis not present

## 2013-11-19 DIAGNOSIS — H8110 Benign paroxysmal vertigo, unspecified ear: Secondary | ICD-10-CM | POA: Diagnosis not present

## 2013-11-19 DIAGNOSIS — I129 Hypertensive chronic kidney disease with stage 1 through stage 4 chronic kidney disease, or unspecified chronic kidney disease: Secondary | ICD-10-CM | POA: Diagnosis not present

## 2013-11-19 DIAGNOSIS — J441 Chronic obstructive pulmonary disease with (acute) exacerbation: Secondary | ICD-10-CM | POA: Diagnosis not present

## 2013-11-19 DIAGNOSIS — I509 Heart failure, unspecified: Secondary | ICD-10-CM | POA: Diagnosis not present

## 2013-11-20 ENCOUNTER — Telehealth: Payer: Self-pay | Admitting: Family Medicine

## 2013-11-20 DIAGNOSIS — I129 Hypertensive chronic kidney disease with stage 1 through stage 4 chronic kidney disease, or unspecified chronic kidney disease: Secondary | ICD-10-CM | POA: Diagnosis not present

## 2013-11-20 DIAGNOSIS — I509 Heart failure, unspecified: Secondary | ICD-10-CM | POA: Diagnosis not present

## 2013-11-20 DIAGNOSIS — J441 Chronic obstructive pulmonary disease with (acute) exacerbation: Secondary | ICD-10-CM | POA: Diagnosis not present

## 2013-11-20 DIAGNOSIS — N184 Chronic kidney disease, stage 4 (severe): Secondary | ICD-10-CM | POA: Diagnosis not present

## 2013-11-20 DIAGNOSIS — S72009D Fracture of unspecified part of neck of unspecified femur, subsequent encounter for closed fracture with routine healing: Secondary | ICD-10-CM | POA: Diagnosis not present

## 2013-11-20 DIAGNOSIS — H8110 Benign paroxysmal vertigo, unspecified ear: Secondary | ICD-10-CM | POA: Diagnosis not present

## 2013-11-20 NOTE — Telephone Encounter (Signed)
Home health nurse from [facility] called to inform us that Meredith Bender BP 184/106 and that her morning meds were taken. She decided not to do PT. jw

## 2013-11-21 ENCOUNTER — Encounter: Payer: Self-pay | Admitting: Family Medicine

## 2013-11-21 DIAGNOSIS — J441 Chronic obstructive pulmonary disease with (acute) exacerbation: Secondary | ICD-10-CM | POA: Diagnosis not present

## 2013-11-21 DIAGNOSIS — N184 Chronic kidney disease, stage 4 (severe): Secondary | ICD-10-CM | POA: Diagnosis not present

## 2013-11-21 DIAGNOSIS — S72009D Fracture of unspecified part of neck of unspecified femur, subsequent encounter for closed fracture with routine healing: Secondary | ICD-10-CM | POA: Diagnosis not present

## 2013-11-21 DIAGNOSIS — I509 Heart failure, unspecified: Secondary | ICD-10-CM | POA: Diagnosis not present

## 2013-11-21 DIAGNOSIS — I129 Hypertensive chronic kidney disease with stage 1 through stage 4 chronic kidney disease, or unspecified chronic kidney disease: Secondary | ICD-10-CM | POA: Diagnosis not present

## 2013-11-21 DIAGNOSIS — H8110 Benign paroxysmal vertigo, unspecified ear: Secondary | ICD-10-CM | POA: Diagnosis not present

## 2013-11-25 ENCOUNTER — Telehealth: Payer: Self-pay | Admitting: Family Medicine

## 2013-11-25 MED ORDER — FUROSEMIDE 20 MG PO TABS: 20.0000 mg | ORAL_TABLET | Freq: Every day | ORAL | Status: DC

## 2013-11-25 NOTE — Telephone Encounter (Signed)
Message copied by [name] on Mon Nov 25, 2013  9:35 AM ------      Message from: Babette Relic      Created: Fri Nov 22, 2013 11:10 AM      Regarding: [place]      Contact: [phone]       Tammy at [facility] regarding a medication that patient is out of her furmosedide (Lasix) for 3 days.  Has an appointment with you at [facility] on Wednesday - refill to be sent to [place] ------

## 2013-11-27 ENCOUNTER — Ambulatory Visit (INDEPENDENT_AMBULATORY_CARE_PROVIDER_SITE_OTHER): Payer: Medicare Other | Admitting: Family Medicine

## 2013-11-27 ENCOUNTER — Encounter: Payer: Self-pay | Admitting: Family Medicine

## 2013-11-27 VITALS — BP 174/78 | HR 72 | Temp 98.1°F | Ht 65.0 in | Wt 102.2 lb

## 2013-11-27 DIAGNOSIS — S72143A Displaced intertrochanteric fracture of unspecified femur, initial encounter for closed fracture: Secondary | ICD-10-CM

## 2013-11-27 DIAGNOSIS — M129 Arthropathy, unspecified: Secondary | ICD-10-CM | POA: Diagnosis not present

## 2013-11-27 DIAGNOSIS — S72141A Displaced intertrochanteric fracture of right femur, initial encounter for closed fracture: Secondary | ICD-10-CM

## 2013-11-27 DIAGNOSIS — M1990 Unspecified osteoarthritis, unspecified site: Secondary | ICD-10-CM

## 2013-11-27 DIAGNOSIS — I10 Essential (primary) hypertension: Secondary | ICD-10-CM

## 2013-11-27 DIAGNOSIS — Z7189 Other specified counseling: Secondary | ICD-10-CM | POA: Diagnosis not present

## 2013-11-27 MED ORDER — TRAMADOL HCL (ER BIPHASIC) 200 MG PO CP24: ORAL_CAPSULE | ORAL | Status: DC

## 2013-11-27 MED ORDER — LORATADINE 10 MG PO TABS: 10.0000 mg | ORAL_TABLET | Freq: Every day | ORAL | Status: DC

## 2013-11-27 MED ORDER — HYDRALAZINE HCL 25 MG PO TABS: 25.0000 mg | ORAL_TABLET | Freq: Three times a day (TID) | ORAL | Status: DC

## 2013-11-27 MED ORDER — VERAPAMIL HCL ER 240 MG PO CP24: ORAL_CAPSULE | ORAL | Status: DC

## 2013-11-28 ENCOUNTER — Encounter: Payer: Self-pay | Admitting: *Deleted

## 2013-11-28 ENCOUNTER — Telehealth: Payer: Self-pay | Admitting: Family Medicine

## 2013-11-28 DIAGNOSIS — S72009D Fracture of unspecified part of neck of unspecified femur, subsequent encounter for closed fracture with routine healing: Secondary | ICD-10-CM | POA: Diagnosis not present

## 2013-11-28 DIAGNOSIS — H8110 Benign paroxysmal vertigo, unspecified ear: Secondary | ICD-10-CM | POA: Diagnosis not present

## 2013-11-28 DIAGNOSIS — N184 Chronic kidney disease, stage 4 (severe): Secondary | ICD-10-CM | POA: Diagnosis not present

## 2013-11-28 DIAGNOSIS — J441 Chronic obstructive pulmonary disease with (acute) exacerbation: Secondary | ICD-10-CM | POA: Diagnosis not present

## 2013-11-28 DIAGNOSIS — I509 Heart failure, unspecified: Secondary | ICD-10-CM | POA: Diagnosis not present

## 2013-11-28 DIAGNOSIS — I129 Hypertensive chronic kidney disease with stage 1 through stage 4 chronic kidney disease, or unspecified chronic kidney disease: Secondary | ICD-10-CM | POA: Diagnosis not present

## 2013-11-28 NOTE — Progress Notes (Signed)
Received fax from [place] stating that Tramadol HCL ER 200 mg tablet is not covered my pt's insurance.  Tramadol HCL 50 mg tab is covered.  Derl Barrow, RN

## 2013-11-28 NOTE — Telephone Encounter (Signed)
Meredith Bender's daughter need to have the rx for Florastor 250 mg sent to [facility] on [address].  P lease call when comp leted

## 2013-11-29 ENCOUNTER — Other Ambulatory Visit: Payer: Self-pay | Admitting: Family Medicine

## 2013-11-29 DIAGNOSIS — S72009D Fracture of unspecified part of neck of unspecified femur, subsequent encounter for closed fracture with routine healing: Secondary | ICD-10-CM | POA: Diagnosis not present

## 2013-11-29 DIAGNOSIS — J441 Chronic obstructive pulmonary disease with (acute) exacerbation: Secondary | ICD-10-CM | POA: Diagnosis not present

## 2013-11-29 DIAGNOSIS — I509 Heart failure, unspecified: Secondary | ICD-10-CM | POA: Diagnosis not present

## 2013-11-29 DIAGNOSIS — H8110 Benign paroxysmal vertigo, unspecified ear: Secondary | ICD-10-CM | POA: Diagnosis not present

## 2013-11-29 DIAGNOSIS — I129 Hypertensive chronic kidney disease with stage 1 through stage 4 chronic kidney disease, or unspecified chronic kidney disease: Secondary | ICD-10-CM | POA: Diagnosis not present

## 2013-11-29 DIAGNOSIS — N184 Chronic kidney disease, stage 4 (severe): Secondary | ICD-10-CM | POA: Diagnosis not present

## 2013-11-29 MED ORDER — TRAMADOL HCL 50 MG PO TABS: ORAL_TABLET | ORAL | Status: DC

## 2013-11-29 NOTE — Progress Notes (Signed)
Patient ID: CELESE BANNER, female   DOB: [birth year], 80 y.o.   MRN: [id number] Dear [name] Team Please tell her I will write a new rx and she can pick it up---I cannot call it in It will also be three times a day if we use this formulation--cannot help tthat  THANKS! [name]

## 2013-12-02 DIAGNOSIS — Z7189 Other specified counseling: Secondary | ICD-10-CM | POA: Insufficient documentation

## 2013-12-02 DIAGNOSIS — M1990 Unspecified osteoarthritis, unspecified site: Secondary | ICD-10-CM | POA: Insufficient documentation

## 2013-12-02 NOTE — Assessment & Plan Note (Signed)
She seems to have liver cover from her hip fracture.

## 2013-12-02 NOTE — Progress Notes (Signed)
   Subjective:    Patient ID: Meredith Bender, female    DOB: 10-08-1925, 80 y.o.   MRN: [id number]  HPI Followup recent fairly long stay at nursing home after hip fracture. This was right after she has been hospitalized here for diverticulitis severe. She brings with her a med list. She is here today with her daughter. #1. Pain control has been a bit of a problem. Her daughter's concern she's taking too much pain medicine. Additionally she is concerned that her mother is not taking her medicines correctly. Her pain is mostly musculoskeletal in shoulders and hips. She's really not had that much increased pain since the hip fracture acutely healed. She had baseline pain before that. #2. Hypertension. Daughters concerned about where her blood pressure should be where it is. Patient denies any symptoms of headache, no blurry vision or visual changes, no focal neurological deficits such as numbness tingling difficulty speaking or walking.   Review of Systems  Constitutional: Negative for chills, appetite change and unexpected weight change.  HENT: Negative for trouble swallowing.   Eyes: Negative for pain and visual disturbance.  Respiratory: Negative for cough, shortness of breath and wheezing.   Cardiovascular: Negative for chest pain, palpitations and leg swelling.  Gastrointestinal: Negative for abdominal pain.  Genitourinary: Negative for frequency.  Musculoskeletal: Positive for arthralgias and joint swelling.  Neurological: Negative for headaches.  Psychiatric/Behavioral: Negative for hallucinations, dysphoric mood and agitation. The patient is not nervous/anxious.        Objective:   Physical Exam  Constitutional: She is oriented to person, place, and time.  Well-developed somewhat thin female. She moves is a little bit of a slow gait but without any assistance needed for gait or rising from a chair.  HENT:  Mouth/Throat: Oropharynx is clear and moist.  Eyes: No scleral icterus.    Neck: Normal range of motion. Neck supple.  Cardiovascular: Normal rate and regular rhythm.   Pulmonary/Chest: Effort normal.  Neurological: She is alert and oriented to person, place, and time.  Psychiatric: She has a normal mood and affect. Her behavior is normal. Judgment and thought content normal.          Assessment & Plan:  We also filled out DO NOT RESUSCITATE paperwork today. I discussed with her and with her daughter and she wishes at this time to be DO NOT RESUSCITATE. She did say we would put the date of one year from now on the form and revisit at that time.

## 2013-12-02 NOTE — Assessment & Plan Note (Addendum)
I discussed with her and her daughter parameters I would expect for her blood pressure control which would be somewhere between 212-248 systolic. Today she is 174. She's asymptomatic. I think will not change anything regarding her blood pressure medication and leave her on the hydralazine, furosemide, verapamil. We also talked about her age and whether or not she wants to continue treatment for hyperlipidemia. Her only real indication was prior history of a small CVA. After discussion today she decided to stop her pravastatin.

## 2013-12-03 DIAGNOSIS — I129 Hypertensive chronic kidney disease with stage 1 through stage 4 chronic kidney disease, or unspecified chronic kidney disease: Secondary | ICD-10-CM | POA: Diagnosis not present

## 2013-12-03 DIAGNOSIS — I509 Heart failure, unspecified: Secondary | ICD-10-CM | POA: Diagnosis not present

## 2013-12-03 DIAGNOSIS — H8110 Benign paroxysmal vertigo, unspecified ear: Secondary | ICD-10-CM | POA: Diagnosis not present

## 2013-12-03 DIAGNOSIS — S72009D Fracture of unspecified part of neck of unspecified femur, subsequent encounter for closed fracture with routine healing: Secondary | ICD-10-CM | POA: Diagnosis not present

## 2013-12-03 DIAGNOSIS — N184 Chronic kidney disease, stage 4 (severe): Secondary | ICD-10-CM | POA: Diagnosis not present

## 2013-12-03 DIAGNOSIS — J441 Chronic obstructive pulmonary disease with (acute) exacerbation: Secondary | ICD-10-CM | POA: Diagnosis not present

## 2013-12-03 MED ORDER — PROBIOTIC DAILY PO CAPS: ORAL_CAPSULE | ORAL | Status: DC

## 2013-12-03 NOTE — Telephone Encounter (Signed)
Dear [name] Team This has been done [facility]! [name]

## 2013-12-03 NOTE — Telephone Encounter (Signed)
Spoke with patient's daughter and informed her of below 

## 2013-12-05 DIAGNOSIS — H8110 Benign paroxysmal vertigo, unspecified ear: Secondary | ICD-10-CM | POA: Diagnosis not present

## 2013-12-05 DIAGNOSIS — S72009D Fracture of unspecified part of neck of unspecified femur, subsequent encounter for closed fracture with routine healing: Secondary | ICD-10-CM | POA: Diagnosis not present

## 2013-12-05 DIAGNOSIS — I509 Heart failure, unspecified: Secondary | ICD-10-CM | POA: Diagnosis not present

## 2013-12-05 DIAGNOSIS — N184 Chronic kidney disease, stage 4 (severe): Secondary | ICD-10-CM | POA: Diagnosis not present

## 2013-12-05 DIAGNOSIS — I129 Hypertensive chronic kidney disease with stage 1 through stage 4 chronic kidney disease, or unspecified chronic kidney disease: Secondary | ICD-10-CM | POA: Diagnosis not present

## 2013-12-05 DIAGNOSIS — J441 Chronic obstructive pulmonary disease with (acute) exacerbation: Secondary | ICD-10-CM | POA: Diagnosis not present

## 2013-12-10 ENCOUNTER — Telehealth: Payer: Self-pay | Admitting: Family Medicine

## 2013-12-10 DIAGNOSIS — S72009D Fracture of unspecified part of neck of unspecified femur, subsequent encounter for closed fracture with routine healing: Secondary | ICD-10-CM | POA: Diagnosis not present

## 2013-12-10 DIAGNOSIS — I509 Heart failure, unspecified: Secondary | ICD-10-CM | POA: Diagnosis not present

## 2013-12-10 DIAGNOSIS — N184 Chronic kidney disease, stage 4 (severe): Secondary | ICD-10-CM | POA: Diagnosis not present

## 2013-12-10 DIAGNOSIS — H8110 Benign paroxysmal vertigo, unspecified ear: Secondary | ICD-10-CM | POA: Diagnosis not present

## 2013-12-10 DIAGNOSIS — I129 Hypertensive chronic kidney disease with stage 1 through stage 4 chronic kidney disease, or unspecified chronic kidney disease: Secondary | ICD-10-CM | POA: Diagnosis not present

## 2013-12-10 DIAGNOSIS — J441 Chronic obstructive pulmonary disease with (acute) exacerbation: Secondary | ICD-10-CM | POA: Diagnosis not present

## 2013-12-10 NOTE — Telephone Encounter (Signed)
Please call back to Nurse Mgr, Zigmund Daniel with [facility] to give verbal order for Social Worker Consultation for long term care.

## 2013-12-11 NOTE — Telephone Encounter (Signed)
Dear [name] Team Please call and give this verbal order for Social Work consultation for long term care as requested to the Midwife Zigmund Daniel) at [facility]! [name]

## 2013-12-12 DIAGNOSIS — N184 Chronic kidney disease, stage 4 (severe): Secondary | ICD-10-CM | POA: Diagnosis not present

## 2013-12-12 DIAGNOSIS — S72009D Fracture of unspecified part of neck of unspecified femur, subsequent encounter for closed fracture with routine healing: Secondary | ICD-10-CM | POA: Diagnosis not present

## 2013-12-12 DIAGNOSIS — I129 Hypertensive chronic kidney disease with stage 1 through stage 4 chronic kidney disease, or unspecified chronic kidney disease: Secondary | ICD-10-CM | POA: Diagnosis not present

## 2013-12-12 DIAGNOSIS — H8110 Benign paroxysmal vertigo, unspecified ear: Secondary | ICD-10-CM | POA: Diagnosis not present

## 2013-12-12 DIAGNOSIS — J441 Chronic obstructive pulmonary disease with (acute) exacerbation: Secondary | ICD-10-CM | POA: Diagnosis not present

## 2013-12-12 DIAGNOSIS — I509 Heart failure, unspecified: Secondary | ICD-10-CM | POA: Diagnosis not present

## 2013-12-13 DIAGNOSIS — J441 Chronic obstructive pulmonary disease with (acute) exacerbation: Secondary | ICD-10-CM | POA: Diagnosis not present

## 2013-12-13 DIAGNOSIS — S72009D Fracture of unspecified part of neck of unspecified femur, subsequent encounter for closed fracture with routine healing: Secondary | ICD-10-CM | POA: Diagnosis not present

## 2013-12-13 DIAGNOSIS — I129 Hypertensive chronic kidney disease with stage 1 through stage 4 chronic kidney disease, or unspecified chronic kidney disease: Secondary | ICD-10-CM | POA: Diagnosis not present

## 2013-12-13 DIAGNOSIS — N184 Chronic kidney disease, stage 4 (severe): Secondary | ICD-10-CM | POA: Diagnosis not present

## 2013-12-13 DIAGNOSIS — I509 Heart failure, unspecified: Secondary | ICD-10-CM | POA: Diagnosis not present

## 2013-12-13 DIAGNOSIS — H8110 Benign paroxysmal vertigo, unspecified ear: Secondary | ICD-10-CM | POA: Diagnosis not present

## 2013-12-19 ENCOUNTER — Telehealth: Payer: Self-pay | Admitting: Family Medicine

## 2013-12-19 NOTE — Telephone Encounter (Signed)
Tammy called from [facility] care to report that the pt's BP. Left arm 198/96 and the right arm 168/82. If you have any questions please call Tammy at [phone]. Blima Rich

## 2013-12-19 NOTE — Telephone Encounter (Signed)
Pt called and wanted to know if she was cleared to drive her car. jw

## 2013-12-23 NOTE — Telephone Encounter (Signed)
Patient calls back again, would like to know when she will be able to drive again. Please advise.

## 2013-12-23 NOTE — Telephone Encounter (Signed)
Meredith Bender,Meredith Bender

## 2013-12-23 NOTE — Telephone Encounter (Signed)
Dear [name] Team Please let her know that I am not sure she IS going to be able to drive safely again. I cannot KEEP her from driving, but it is my recommendation that she probably should not drive THANKS! [name]

## 2013-12-26 DIAGNOSIS — N184 Chronic kidney disease, stage 4 (severe): Secondary | ICD-10-CM | POA: Diagnosis not present

## 2013-12-26 DIAGNOSIS — I509 Heart failure, unspecified: Secondary | ICD-10-CM | POA: Diagnosis not present

## 2013-12-26 DIAGNOSIS — H8110 Benign paroxysmal vertigo, unspecified ear: Secondary | ICD-10-CM | POA: Diagnosis not present

## 2013-12-26 DIAGNOSIS — J441 Chronic obstructive pulmonary disease with (acute) exacerbation: Secondary | ICD-10-CM | POA: Diagnosis not present

## 2013-12-26 DIAGNOSIS — I129 Hypertensive chronic kidney disease with stage 1 through stage 4 chronic kidney disease, or unspecified chronic kidney disease: Secondary | ICD-10-CM | POA: Diagnosis not present

## 2013-12-26 DIAGNOSIS — S72009D Fracture of unspecified part of neck of unspecified femur, subsequent encounter for closed fracture with routine healing: Secondary | ICD-10-CM | POA: Diagnosis not present

## 2013-12-27 DIAGNOSIS — N184 Chronic kidney disease, stage 4 (severe): Secondary | ICD-10-CM | POA: Diagnosis not present

## 2013-12-27 DIAGNOSIS — I129 Hypertensive chronic kidney disease with stage 1 through stage 4 chronic kidney disease, or unspecified chronic kidney disease: Secondary | ICD-10-CM | POA: Diagnosis not present

## 2013-12-27 DIAGNOSIS — S72009D Fracture of unspecified part of neck of unspecified femur, subsequent encounter for closed fracture with routine healing: Secondary | ICD-10-CM | POA: Diagnosis not present

## 2013-12-27 DIAGNOSIS — H8110 Benign paroxysmal vertigo, unspecified ear: Secondary | ICD-10-CM | POA: Diagnosis not present

## 2013-12-27 DIAGNOSIS — J441 Chronic obstructive pulmonary disease with (acute) exacerbation: Secondary | ICD-10-CM | POA: Diagnosis not present

## 2013-12-27 DIAGNOSIS — I509 Heart failure, unspecified: Secondary | ICD-10-CM | POA: Diagnosis not present

## 2013-12-31 DIAGNOSIS — I509 Heart failure, unspecified: Secondary | ICD-10-CM | POA: Diagnosis not present

## 2013-12-31 DIAGNOSIS — N184 Chronic kidney disease, stage 4 (severe): Secondary | ICD-10-CM | POA: Diagnosis not present

## 2013-12-31 DIAGNOSIS — I129 Hypertensive chronic kidney disease with stage 1 through stage 4 chronic kidney disease, or unspecified chronic kidney disease: Secondary | ICD-10-CM | POA: Diagnosis not present

## 2013-12-31 DIAGNOSIS — S72009D Fracture of unspecified part of neck of unspecified femur, subsequent encounter for closed fracture with routine healing: Secondary | ICD-10-CM | POA: Diagnosis not present

## 2013-12-31 DIAGNOSIS — J441 Chronic obstructive pulmonary disease with (acute) exacerbation: Secondary | ICD-10-CM | POA: Diagnosis not present

## 2013-12-31 DIAGNOSIS — H8110 Benign paroxysmal vertigo, unspecified ear: Secondary | ICD-10-CM | POA: Diagnosis not present

## 2014-01-01 ENCOUNTER — Encounter: Payer: Self-pay | Admitting: Family Medicine

## 2014-01-01 ENCOUNTER — Ambulatory Visit (INDEPENDENT_AMBULATORY_CARE_PROVIDER_SITE_OTHER): Payer: Medicare Other | Admitting: Family Medicine

## 2014-01-01 VITALS — BP 168/94 | HR 96 | Temp 98.7°F | Ht 65.0 in | Wt 101.3 lb

## 2014-01-01 DIAGNOSIS — Z23 Encounter for immunization: Secondary | ICD-10-CM | POA: Diagnosis not present

## 2014-01-01 DIAGNOSIS — S72009D Fracture of unspecified part of neck of unspecified femur, subsequent encounter for closed fracture with routine healing: Secondary | ICD-10-CM

## 2014-01-01 DIAGNOSIS — S72001D Fracture of unspecified part of neck of right femur, subsequent encounter for closed fracture with routine healing: Secondary | ICD-10-CM

## 2014-01-02 ENCOUNTER — Telehealth: Payer: Self-pay | Admitting: Family Medicine

## 2014-01-02 NOTE — Telephone Encounter (Signed)
Patient seen by Dr. Nori Riis yesterday and given a pneumonia shot. She states he arm is extremely sore and just wants to make sure that that is normal. Please advise.

## 2014-01-02 NOTE — Telephone Encounter (Signed)
Pt informed soreness is normal after a pneumonia vaccine.  Pt denies any swelling, redness or fever.  Pt advised to take some Ibuprofen for the soreness and call the clinic if she develops any of the following symptoms.  Derl Barrow, RN

## 2014-01-03 ENCOUNTER — Telehealth: Payer: Self-pay | Admitting: Family Medicine

## 2014-01-03 NOTE — Telephone Encounter (Signed)
Dear Meredith Bender Team [facility]! I am not in clinic at [facility] today so Meredith Bender cannot come in TODAY! It is not something I just place orders for--I have to DO the injection--so see if Meredith Bender wants to be scheduled at [facility] or at [facility] has never BEEN to [facility] so [facility] migt be better--you could put her on one of [facility] clinics (not on Dr Annett Gula [facility] clinics) or in my reg clinic. THANKS! [name]

## 2014-01-03 NOTE — Telephone Encounter (Signed)
Patient states that when she was here on 01/01/14 her & Dr. Nori Riis spoke about possibly starting steroid injections for pain in shoulders. She is wanting to come in today for this. Please place orders in [facility]

## 2014-01-03 NOTE — Telephone Encounter (Signed)
Patient wants to know if she can see another doctor for this. The next opening for you isn't until 7/8 @ [facility]

## 2014-01-06 NOTE — Progress Notes (Signed)
Patient ID: Meredith Bender, female   DOB: May 11, 1926, 80 y.o.   MRN: [id number] Patient here for followup and discussion about driving. She really wants to start driving again in fact she drove to clinic swimmy. Said she will nervous when she first started everything seemed fine. She had no mishaps.  OBJECTIVE: Well-developed female no acute distress. She rises from a chair without assistance, has normal gait. HIPS: Good range of motion internal/external rotation. Her extremity strength is normal for age. No sensory deficits feet. Normal proprioception right foot. Normal strength plantarflexion and dorsiflexion bilateral feet. NEURO: Alert and oriented x4. No gross focal neurological deficit. ASSESSMENT: Followup recent nursing home stay for hip fracture, multiple other issues. PLAN: Long discussion with her today about driving. I cannot tell her not to drive. It seems very important to her so I think she is going to drive. We did talk about precautions such as not driving at night, not driving on the highways. Most of her trips will be from her house to locations within 5 miles of her house. I think she's probably as safe as any other octogenarian. 2 followup when necessary

## 2014-01-07 NOTE — Telephone Encounter (Signed)
I would rather she see me but if you can get her in withDr Hess or [facility]--thwey both do a lot of injections THANKS! [name]

## 2014-01-09 ENCOUNTER — Ambulatory Visit (INDEPENDENT_AMBULATORY_CARE_PROVIDER_SITE_OTHER): Payer: Medicare Other | Admitting: Family Medicine

## 2014-01-09 VITALS — BP 178/72 | HR 79 | Temp 98.0°F | Resp 20 | Wt 102.1 lb

## 2014-01-09 DIAGNOSIS — N184 Chronic kidney disease, stage 4 (severe): Secondary | ICD-10-CM | POA: Diagnosis not present

## 2014-01-09 DIAGNOSIS — M67919 Unspecified disorder of synovium and tendon, unspecified shoulder: Secondary | ICD-10-CM

## 2014-01-09 DIAGNOSIS — M129 Arthropathy, unspecified: Secondary | ICD-10-CM

## 2014-01-09 DIAGNOSIS — I129 Hypertensive chronic kidney disease with stage 1 through stage 4 chronic kidney disease, or unspecified chronic kidney disease: Secondary | ICD-10-CM | POA: Diagnosis not present

## 2014-01-09 DIAGNOSIS — S72009D Fracture of unspecified part of neck of unspecified femur, subsequent encounter for closed fracture with routine healing: Secondary | ICD-10-CM | POA: Diagnosis not present

## 2014-01-09 DIAGNOSIS — M1990 Unspecified osteoarthritis, unspecified site: Secondary | ICD-10-CM

## 2014-01-09 DIAGNOSIS — M75102 Unspecified rotator cuff tear or rupture of left shoulder, not specified as traumatic: Secondary | ICD-10-CM

## 2014-01-09 DIAGNOSIS — I509 Heart failure, unspecified: Secondary | ICD-10-CM | POA: Diagnosis not present

## 2014-01-09 DIAGNOSIS — M75101 Unspecified rotator cuff tear or rupture of right shoulder, not specified as traumatic: Secondary | ICD-10-CM

## 2014-01-09 DIAGNOSIS — M719 Bursopathy, unspecified: Secondary | ICD-10-CM

## 2014-01-09 DIAGNOSIS — J441 Chronic obstructive pulmonary disease with (acute) exacerbation: Secondary | ICD-10-CM | POA: Diagnosis not present

## 2014-01-09 DIAGNOSIS — H8110 Benign paroxysmal vertigo, unspecified ear: Secondary | ICD-10-CM | POA: Diagnosis not present

## 2014-01-09 DIAGNOSIS — M25569 Pain in unspecified knee: Secondary | ICD-10-CM

## 2014-01-09 DIAGNOSIS — M25562 Pain in left knee: Secondary | ICD-10-CM

## 2014-01-09 DIAGNOSIS — M25561 Pain in right knee: Secondary | ICD-10-CM | POA: Insufficient documentation

## 2014-01-09 NOTE — Assessment & Plan Note (Signed)
Corticosteroid injection into left subacromial bursa and left knee joint today. She really wants to have the other joints done before her family visits digitalized also her back in 2 weeks. We discussed precautions and possible side effects she'll let me know she is issues.

## 2014-01-09 NOTE — Progress Notes (Signed)
Patient ID: Meredith Bender, female   DOB: 10/21/1925, 80 y.o.   MRN: [id number] Patient here for injection therapy into her left shoulder and left knee. She has had corticosteroid injections in the past and they've been extremely helpful. She had briefly mention this at last office visit area since then, the pain particularly in her left shoulder left knee has gotten much worse. Aching pain. To 7/10 most times. She would also like to have the right shoulder and right knee injected before mid-July she's having family come in if she wants to be "spry" for their visit. She's otherwise doing well. She has been driving without issue.  OBJECTIVE: Well-developed female, thin, no acute distress. KNEES: Bilaterally external changes of osteoarthritis with synovitis. She has mild crepitus bilaterally. She has old extension and flexion bilaterally. Left knee has medial and lateral joint line tenderness but there is no effusion, no erythema, no warmth. Distally she is neurovascularly intact. The calf is soft. Popliteal space is benign. Shoulder: Left shoulder has limited range of motion in lateral abduction she can only get about 140. Forward flexion is normal. Strength is normal. Positive impingement signs. Normal strength in the rotator cuff muscles. Distally neurovascularly intact. Shoulder joints without erythema, no warmth, no sign of skin lesions.  INJECTION: Patient was given informed consent, signed copy in the chart. Appropriate time out was taken. Area prepped and draped in usual sterile fashion. One cc of methylprednisolone 40 mg/ml plus  4 cc of 1% lidocaine without epinephrine was injected into the left knee using a(n) anterior medial approach. The patient tolerated the procedure well. There were no complications. Post procedure instructions were given. INJECTION: Patient was given informed consent, signed copy in the chart. Appropriate time out was taken. Area prepped and draped in usual sterile fashion.  One cc of methylprednisolone 40 mg/ml plus  4 cc of 1% lidocaine without epinephrine was injected into the left subacromial bursa and using a(n) posterior approach. The patient tolerated the procedure well. There were no complications. Post procedure instructions were given.

## 2014-01-23 ENCOUNTER — Ambulatory Visit (INDEPENDENT_AMBULATORY_CARE_PROVIDER_SITE_OTHER): Payer: Medicare Other | Admitting: Family Medicine

## 2014-01-23 DIAGNOSIS — M129 Arthropathy, unspecified: Secondary | ICD-10-CM | POA: Diagnosis not present

## 2014-01-23 DIAGNOSIS — M25569 Pain in unspecified knee: Secondary | ICD-10-CM | POA: Diagnosis not present

## 2014-01-23 DIAGNOSIS — M719 Bursopathy, unspecified: Secondary | ICD-10-CM | POA: Diagnosis not present

## 2014-01-23 DIAGNOSIS — M67919 Unspecified disorder of synovium and tendon, unspecified shoulder: Secondary | ICD-10-CM | POA: Diagnosis not present

## 2014-01-23 DIAGNOSIS — M25562 Pain in left knee: Principal | ICD-10-CM

## 2014-01-23 DIAGNOSIS — N2581 Secondary hyperparathyroidism of renal origin: Secondary | ICD-10-CM | POA: Diagnosis not present

## 2014-01-23 DIAGNOSIS — N039 Chronic nephritic syndrome with unspecified morphologic changes: Secondary | ICD-10-CM | POA: Diagnosis not present

## 2014-01-23 DIAGNOSIS — N184 Chronic kidney disease, stage 4 (severe): Secondary | ICD-10-CM | POA: Diagnosis not present

## 2014-01-23 DIAGNOSIS — M25561 Pain in right knee: Secondary | ICD-10-CM

## 2014-01-23 DIAGNOSIS — M75101 Unspecified rotator cuff tear or rupture of right shoulder, not specified as traumatic: Secondary | ICD-10-CM

## 2014-01-23 DIAGNOSIS — D631 Anemia in chronic kidney disease: Secondary | ICD-10-CM | POA: Diagnosis not present

## 2014-01-23 DIAGNOSIS — M1990 Unspecified osteoarthritis, unspecified site: Secondary | ICD-10-CM

## 2014-01-23 DIAGNOSIS — M75102 Unspecified rotator cuff tear or rupture of left shoulder, not specified as traumatic: Secondary | ICD-10-CM

## 2014-01-23 MED ORDER — METHYLPREDNISOLONE ACETATE 40 MG/ML IJ SUSP
40.0000 mg | Freq: Once | INTRAMUSCULAR | Status: AC
  Administered 2014-01-23: 40 mg via INTRA_ARTICULAR

## 2014-01-24 NOTE — Progress Notes (Signed)
Patient ID: DORTHEY DEPACE, female   DOB: [birth year], 80 y.o.   MRN: [id number] Patient here for corticosteroid injection into her right knee and right subacromial bursa. We did both these injections on her left side several weeks ago and she tolerated that quite well and had significant improvement in her chronic joint pains.   INJECTION: Patient was given informed consent, signed copy in the chart. Appropriate time out was taken. Area prepped and draped in usual sterile fashion. One cc of methylprednisolone 40 mg/ml plus  4 cc of 1% lidocaine without epinephrine was injected into the right subacromial bursa using a(n) posterior approach. The patient tolerated the procedure well. There were no complications. Post procedure instructions were given. INJECTION: Patient was given informed consent, signed copy in the chart. Appropriate time out was taken. Area prepped and draped in usual sterile fashion. One cc of methylprednisolone 40 mg/ml plus  4 cc of 1% lidocaine without epinephrine was injected into the right knee using a(n) anterior medial approach. The patient tolerated the procedure well. There were no complications. Post procedure instructions were given.

## 2014-01-28 ENCOUNTER — Ambulatory Visit (INDEPENDENT_AMBULATORY_CARE_PROVIDER_SITE_OTHER): Payer: Medicare Other | Admitting: Family Medicine

## 2014-01-28 ENCOUNTER — Encounter: Payer: Self-pay | Admitting: Family Medicine

## 2014-01-28 VITALS — BP 198/72 | HR 79 | Temp 98.4°F | Ht 65.0 in | Wt 102.0 lb

## 2014-01-28 DIAGNOSIS — I10 Essential (primary) hypertension: Secondary | ICD-10-CM

## 2014-01-28 NOTE — Patient Instructions (Signed)
Come back so that we can do your injections again in October.  We can do the Left side at that time.  It was very good to see you today!

## 2014-01-29 NOTE — Progress Notes (Signed)
Patient presented for corticosteroid injection of Left shoulder and knee.  However, she had these end of June.  Adamant she had not.  Discussed with patient's PCP Dr. Nori Riis who saw patient and was able to remind her of the injections.  Occurred same day she was cleared to drive, patient states she was so excited about being able to drive she forgot she had received injections.    No charge for today's visit.

## 2014-01-29 NOTE — Assessment & Plan Note (Addendum)
Asymptomatic. Has not taken meds today. To go home and take immediately.  FU with PCP.

## 2014-02-12 DIAGNOSIS — N184 Chronic kidney disease, stage 4 (severe): Secondary | ICD-10-CM | POA: Diagnosis not present

## 2014-02-12 DIAGNOSIS — N2581 Secondary hyperparathyroidism of renal origin: Secondary | ICD-10-CM | POA: Diagnosis not present

## 2014-02-12 DIAGNOSIS — N039 Chronic nephritic syndrome with unspecified morphologic changes: Secondary | ICD-10-CM | POA: Diagnosis not present

## 2014-02-12 DIAGNOSIS — D631 Anemia in chronic kidney disease: Secondary | ICD-10-CM | POA: Diagnosis not present

## 2014-02-12 DIAGNOSIS — I129 Hypertensive chronic kidney disease with stage 1 through stage 4 chronic kidney disease, or unspecified chronic kidney disease: Secondary | ICD-10-CM | POA: Diagnosis not present

## 2014-04-03 DIAGNOSIS — Z23 Encounter for immunization: Secondary | ICD-10-CM | POA: Diagnosis not present

## 2014-04-15 ENCOUNTER — Encounter: Payer: Self-pay | Admitting: Family Medicine

## 2014-04-15 ENCOUNTER — Ambulatory Visit (INDEPENDENT_AMBULATORY_CARE_PROVIDER_SITE_OTHER): Payer: Medicare Other | Admitting: Family Medicine

## 2014-04-15 VITALS — BP 138/72 | HR 76 | Temp 97.9°F | Ht 65.0 in | Wt 104.5 lb

## 2014-04-15 DIAGNOSIS — I5033 Acute on chronic diastolic (congestive) heart failure: Secondary | ICD-10-CM

## 2014-04-15 DIAGNOSIS — I509 Heart failure, unspecified: Secondary | ICD-10-CM | POA: Diagnosis not present

## 2014-04-15 MED ORDER — FUROSEMIDE 20 MG PO TABS
20.0000 mg | ORAL_TABLET | Freq: Every day | ORAL | Status: DC
Start: 2014-04-15 — End: 2015-03-06

## 2014-04-15 NOTE — Patient Instructions (Signed)
Thank you for coming to see me today. It was a pleasure. Today we talked about:   Leg swelling/short of breath: Please take your lasix for the next three days and make an appointment to be seen on Friday. If your symptoms do not improve, We may need to do some blood and other testing. If you have significant trouble breathing or chest pain, please go to the emergency department. If your weight increases by 3lbs, please call our office.   If you have any questions or concerns, please do not hesitate to call the office at [phone].  Sincerely,  Cordelia Poche, MD

## 2014-04-15 NOTE — Progress Notes (Signed)
    Subjective   Meredith Bender is a 80 y.o. female that presents for a same day visit  1. Leg swelling: Patient has a history of heart failure with preserved ejection fraction. Symptoms started last week. She traveled to [facility] with family and was eating a lot of high sodium foods. She has associated mild dyspnea that started last night. No cough or production of sputum. She takes Verapamil and Hydralazine for blood pressure. She takes furosemide for edema, but has not recently been taking that medication. No chest pain.   History  Substance Use Topics  . Smoking status: Never Smoker   . Smokeless tobacco: Not on file  . Alcohol Use: No    ROS Per HPI  Objective   BP 138/72  Pulse 76  Temp(Src) 97.9 F (36.6 C) (Oral)  Ht 5\' 5"  (1.651 m)  Wt 104 lb 8 oz (47.401 kg)  BMI 17.39 kg/m2  SpO2 97%  General: Well appearing, elderly, in no distress HEENT: No JVD Respiratory/Chest: Mild bibasilar crackles without wheezing or accessory muscle usage Cardiovascular: Regular rate and rhythm without murmur Extremities: Trace edema at ankles, worse on left  Assessment and Plan   Please refer to problem based charting of assessment and plan

## 2014-04-15 NOTE — Assessment & Plan Note (Signed)
Episode most likely precipitated by recent dietary changes. She has an almost 3lb weight gain since last visit. Considering age and patient's personal wishes, will treat with Furosemide 20mg  for three days. Follow-up on Friday. Instructed to check weights but states she does not have a scale. If symptoms fail to improve, or worsen, will most likely need longer management with furosemide, labs and chest x-ray. Red flags reviewed with patient.

## 2014-06-02 DIAGNOSIS — N189 Chronic kidney disease, unspecified: Secondary | ICD-10-CM | POA: Diagnosis not present

## 2014-06-02 DIAGNOSIS — N184 Chronic kidney disease, stage 4 (severe): Secondary | ICD-10-CM | POA: Diagnosis not present

## 2014-06-02 DIAGNOSIS — N2581 Secondary hyperparathyroidism of renal origin: Secondary | ICD-10-CM | POA: Diagnosis not present

## 2014-06-09 DIAGNOSIS — N184 Chronic kidney disease, stage 4 (severe): Secondary | ICD-10-CM | POA: Diagnosis not present

## 2014-06-09 DIAGNOSIS — N2581 Secondary hyperparathyroidism of renal origin: Secondary | ICD-10-CM | POA: Diagnosis not present

## 2014-06-09 DIAGNOSIS — D631 Anemia in chronic kidney disease: Secondary | ICD-10-CM | POA: Diagnosis not present

## 2014-06-09 DIAGNOSIS — I129 Hypertensive chronic kidney disease with stage 1 through stage 4 chronic kidney disease, or unspecified chronic kidney disease: Secondary | ICD-10-CM | POA: Diagnosis not present

## 2014-06-25 DIAGNOSIS — H52203 Unspecified astigmatism, bilateral: Secondary | ICD-10-CM | POA: Diagnosis not present

## 2014-06-25 DIAGNOSIS — Z961 Presence of intraocular lens: Secondary | ICD-10-CM | POA: Diagnosis not present

## 2014-06-30 ENCOUNTER — Other Ambulatory Visit: Payer: Self-pay | Admitting: Family Medicine

## 2014-07-01 ENCOUNTER — Telehealth: Payer: Self-pay | Admitting: Family Medicine

## 2014-07-01 NOTE — Telephone Encounter (Signed)
Pt called and would like a refill on her Tramadol. jw °

## 2014-07-01 NOTE — Telephone Encounter (Signed)
Pt called back and was checking to see if her Tramadol has been called in. jw

## 2014-07-02 NOTE — Telephone Encounter (Signed)
Pt is calling back again and wants to know how much longer it is going to be for her to get her refill on Tramadol. Please call pt when ready to pick up. jw

## 2014-07-03 NOTE — Telephone Encounter (Signed)
Pt called again about her tramadol----how much longer?

## 2014-07-04 NOTE — Telephone Encounter (Signed)
Pt called again about tramadol

## 2014-07-04 NOTE — Telephone Encounter (Signed)
Spoke with Dr. Nori Riis, called rx in. Contacted patient and informed her.

## 2014-08-16 IMAGING — RF DG C-ARM 61-120 MIN
1 series · 4 of 4 positions shown · non-contrast
Comparison: Radiographs same date.

CLINICAL DATA: Right hip fracture fixation.

EXAM:
DG OPERATIVE RIGHT HIP
TECHNIQUE: A single spot fluoroscopic AP image of the right hip is submitted.

[Series 1: run · 4 of 4 slices shown]
[im 1/4]
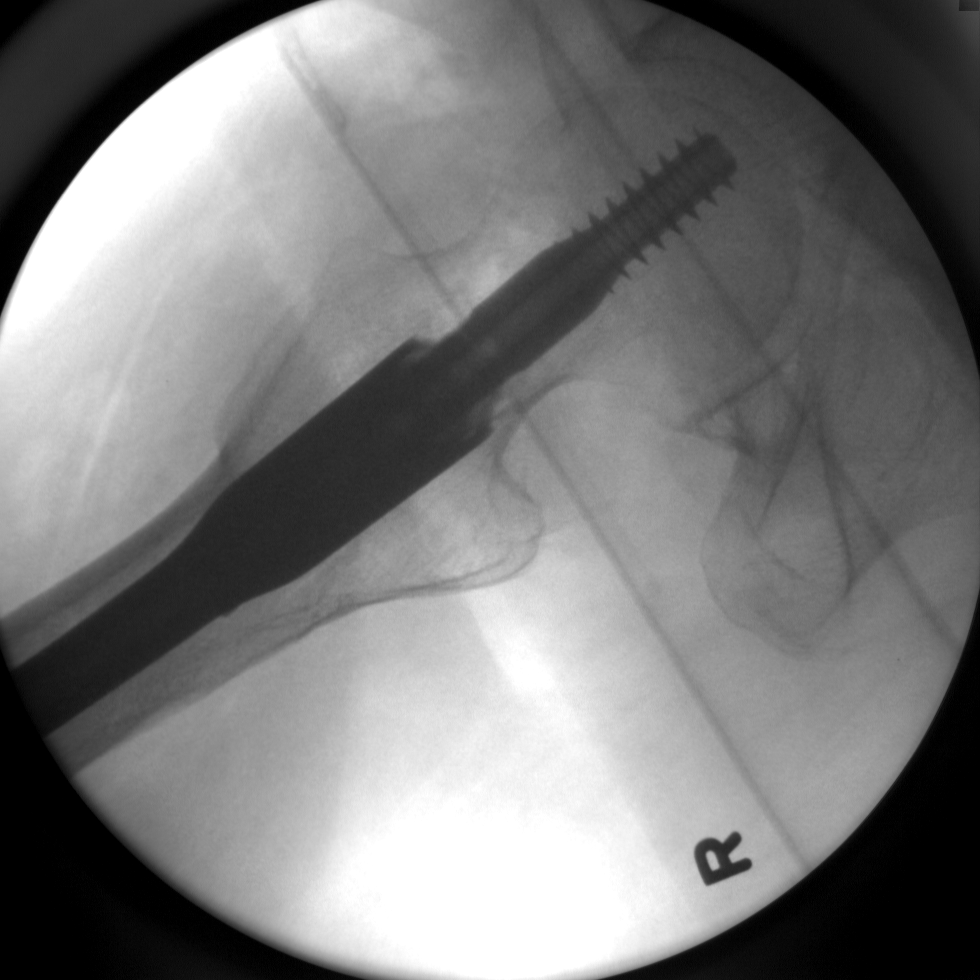
[im 2/4]
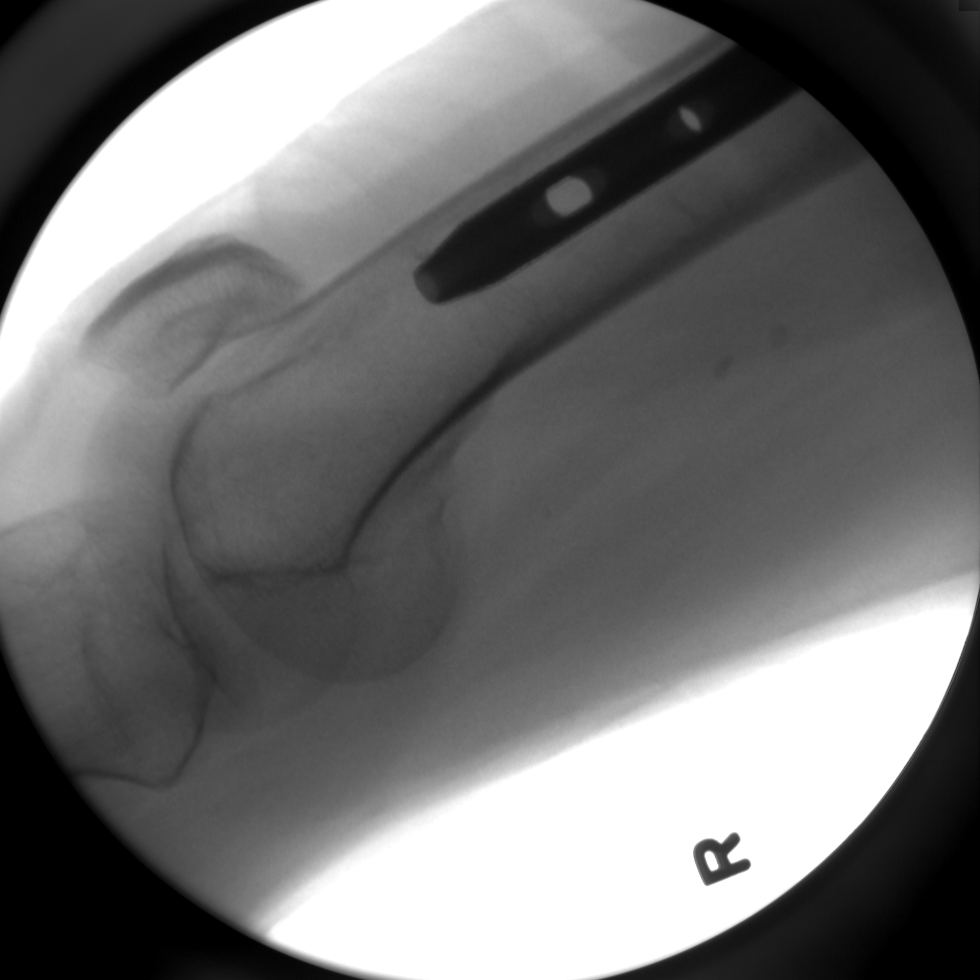
[im 3/4]
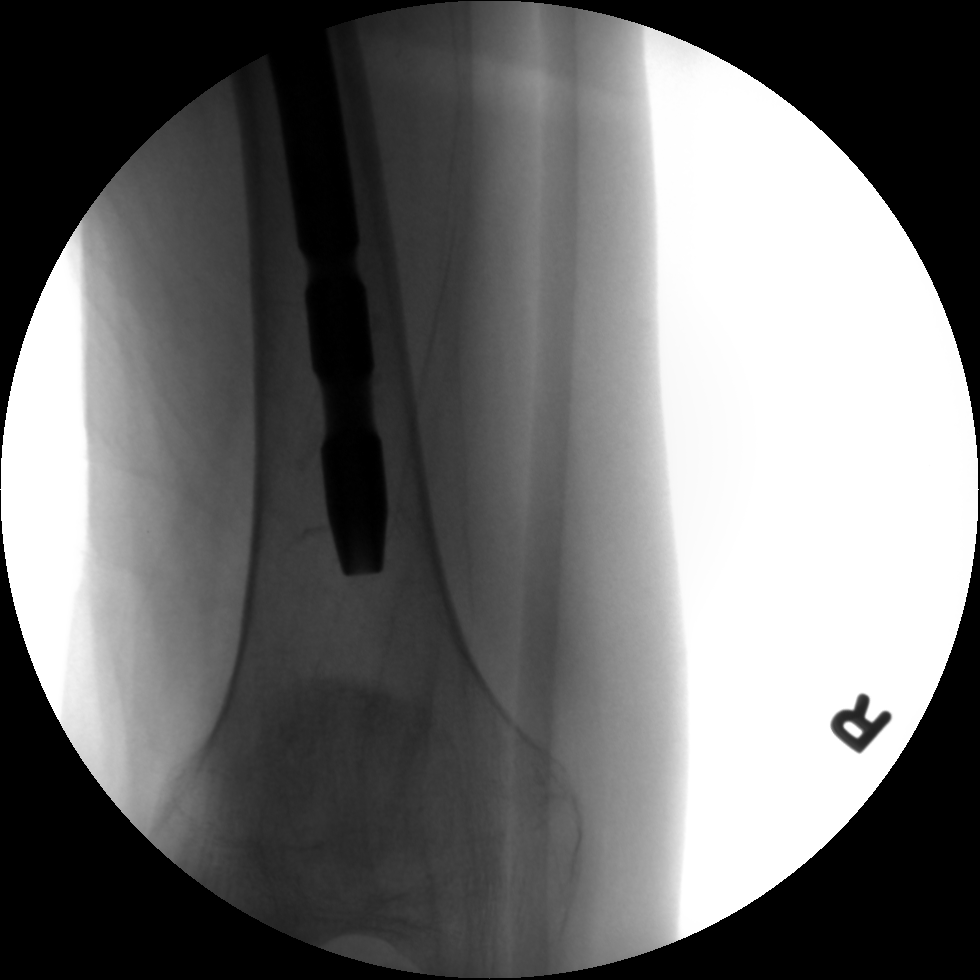
[im 4/4]
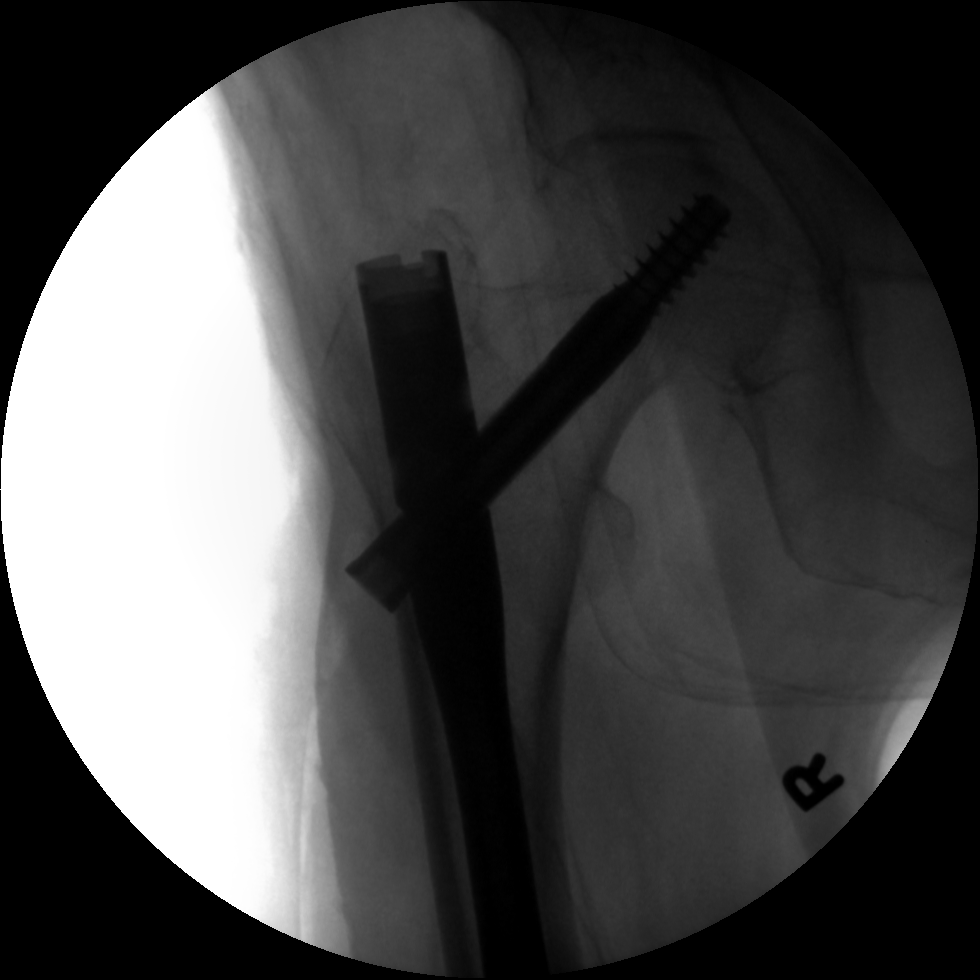

[4 of 4 positions shown; findings below may reference images not displayed]

FINDINGS: Four spot fluoroscopic images demonstrate dynamic screw and
intramedullary nail fixation of the nondisplaced intertrochanteric
femur fracture. The hardware appears well positioned. No
complications are identified.
IMPRESSION: ORIF of intertrochanteric femur fracture without demonstrated
complication.

## 2014-08-16 IMAGING — CR DG PORTABLE PELVIS
1 series · 1 of 1 positions shown · non-contrast
Comparison: 08/29/2013

CLINICAL DATA: Postop proximal right femur ORIF

EXAM:
PORTABLE PELVIS 1-2 VIEWS

[AP]
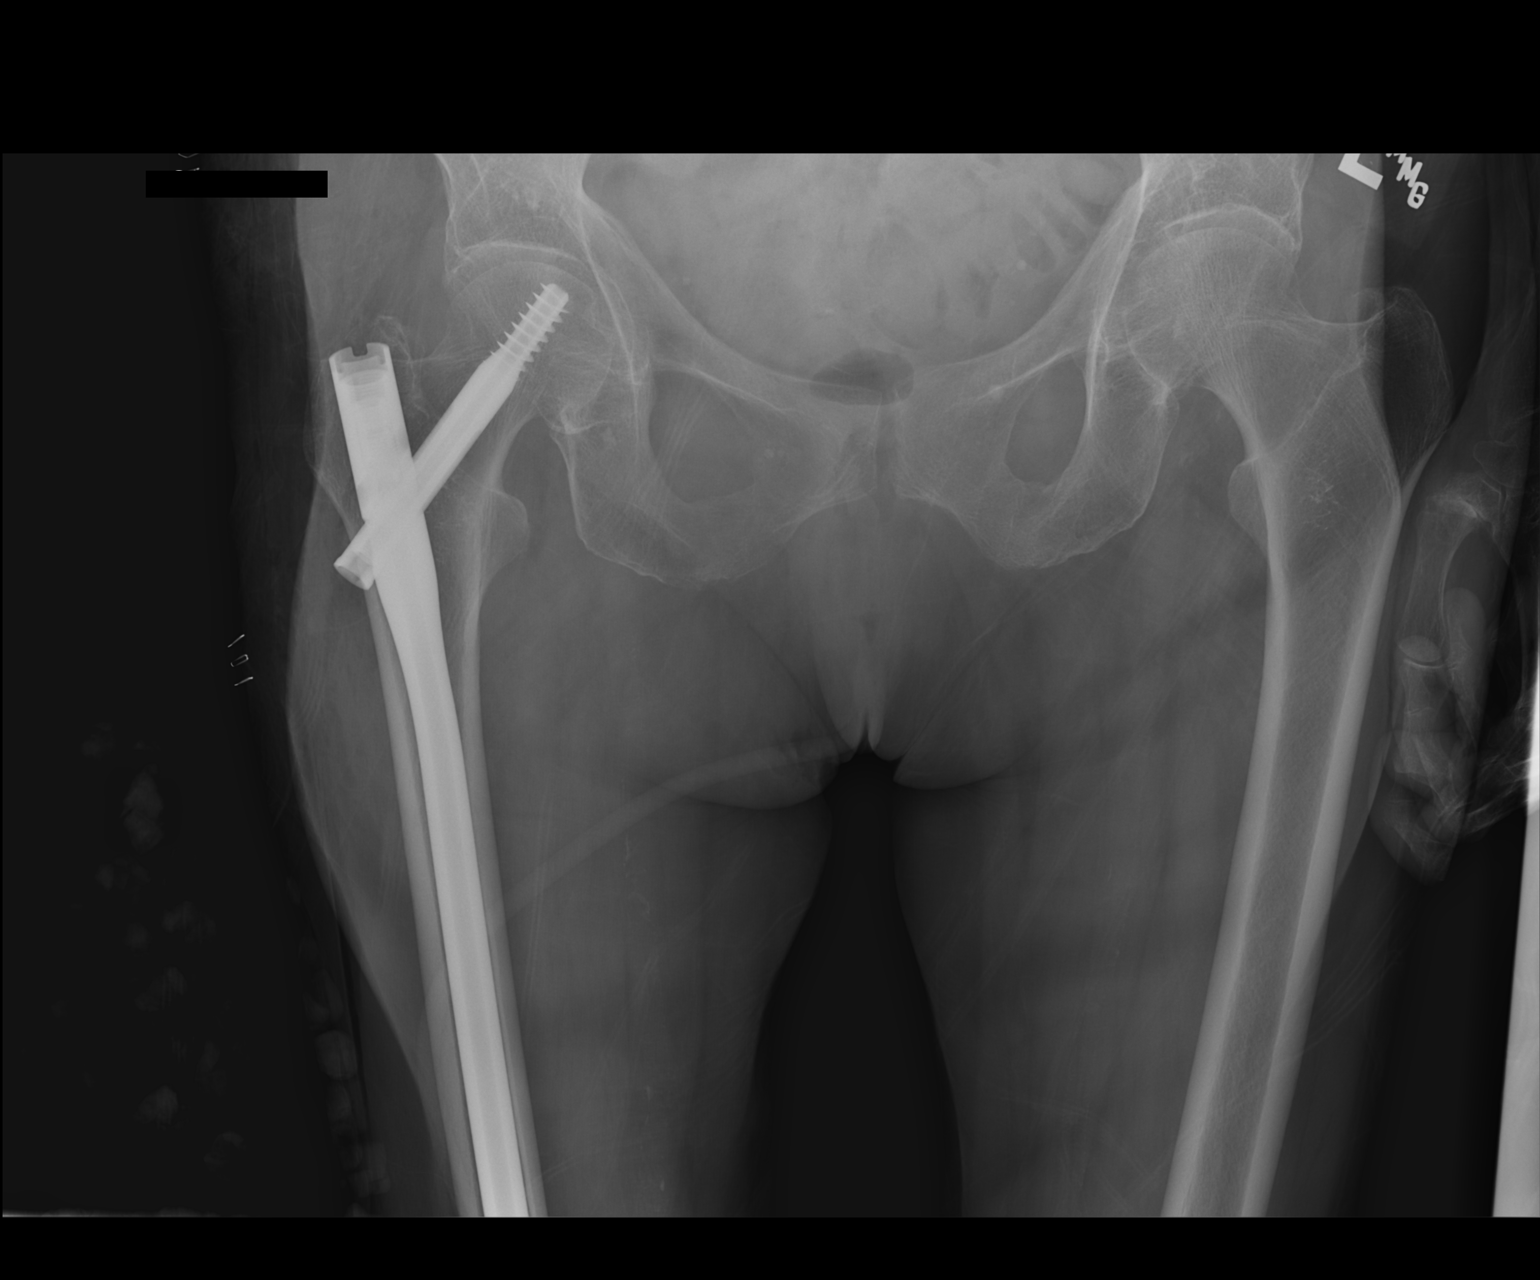

[1 of 1 positions shown; findings below may reference images not displayed]

FINDINGS: The intra medullary rod and the compression screw are well-seated.
Fracture fragments are well aligned. There is no new fracture or
evidence of an operative complication.

## 2014-09-18 DIAGNOSIS — L820 Inflamed seborrheic keratosis: Secondary | ICD-10-CM | POA: Diagnosis not present

## 2014-11-10 ENCOUNTER — Other Ambulatory Visit: Payer: Self-pay | Admitting: Family Medicine

## 2014-11-10 NOTE — Telephone Encounter (Signed)
Dear [name] Team [name] I am confused___is she still my patient? I sort of remember her considering going to a SNF. If she IS my pt, I need to see her as I have not since September 2015. Let me know THANKS! Meredith Bender

## 2014-11-11 NOTE — Telephone Encounter (Signed)
Meredith Bender Can u call her and find this out? THANKS! [name]

## 2014-11-12 NOTE — Telephone Encounter (Signed)
Left voice message for patient to call and schedule an appt with Dr. Nori Riis.  Dr. Nori Riis has not seen patient since 03/2014.  Derl Barrow, RN

## 2014-12-17 ENCOUNTER — Ambulatory Visit (INDEPENDENT_AMBULATORY_CARE_PROVIDER_SITE_OTHER): Payer: Medicare Other | Admitting: Family Medicine

## 2014-12-17 ENCOUNTER — Encounter: Payer: Self-pay | Admitting: Family Medicine

## 2014-12-17 VITALS — BP 175/87 | HR 75 | Temp 97.7°F | Ht 62.75 in | Wt 106.9 lb

## 2014-12-17 DIAGNOSIS — M25561 Pain in right knee: Secondary | ICD-10-CM | POA: Diagnosis present

## 2014-12-17 DIAGNOSIS — Z598 Other problems related to housing and economic circumstances: Secondary | ICD-10-CM | POA: Diagnosis not present

## 2014-12-17 DIAGNOSIS — M25562 Pain in left knee: Secondary | ICD-10-CM

## 2014-12-17 DIAGNOSIS — I10 Essential (primary) hypertension: Secondary | ICD-10-CM

## 2014-12-17 DIAGNOSIS — N184 Chronic kidney disease, stage 4 (severe): Secondary | ICD-10-CM

## 2014-12-17 DIAGNOSIS — Z5989 Other problems related to housing and economic circumstances: Secondary | ICD-10-CM

## 2014-12-17 LAB — BASIC METABOLIC PANEL
BUN: 32 mg/dL — ABNORMAL HIGH (ref 6–23)
CALCIUM: 8.9 mg/dL (ref 8.4–10.5)
CO2: 23 meq/L (ref 19–32)
CREATININE: 2.2 mg/dL — AB (ref 0.50–1.10)
Chloride: 105 mEq/L (ref 96–112)
Glucose, Bld: 98 mg/dL (ref 70–99)
POTASSIUM: 4.2 meq/L (ref 3.5–5.3)
Sodium: 139 mEq/L (ref 135–145)

## 2014-12-17 MED ORDER — METHYLPREDNISOLONE ACETATE 40 MG/ML IJ SUSP
40.0000 mg | Freq: Once | INTRAMUSCULAR | Status: AC
  Administered 2014-12-17: 40 mg via INTRA_ARTICULAR

## 2014-12-17 MED ORDER — LORATADINE 10 MG PO TABS: 10.0000 mg | ORAL_TABLET | Freq: Every day | ORAL | Status: DC

## 2014-12-17 NOTE — Patient Instructions (Signed)
If you need it you can have your next knee shot in 3 months. Great  To see you! I will send you a note about your blood work.

## 2014-12-18 ENCOUNTER — Encounter: Payer: Self-pay | Admitting: Family Medicine

## 2014-12-18 DIAGNOSIS — Z5989 Other problems related to housing and economic circumstances: Secondary | ICD-10-CM | POA: Insufficient documentation

## 2014-12-18 DIAGNOSIS — Z598 Other problems related to housing and economic circumstances: Secondary | ICD-10-CM | POA: Insufficient documentation

## 2014-12-18 NOTE — Assessment & Plan Note (Signed)
B knee injections today Discussed she can have these every 3 months if she needs them.

## 2014-12-18 NOTE — Assessment & Plan Note (Signed)
Long discussion. I respect her right and wishes to remain independent as long as possible. She is very clear that she would rather die at home than in a facility even if it means her death would be sooner.  Greater than 50% of our 45 minute office visit was spent in counseling and education regarding these issues.

## 2014-12-18 NOTE — Assessment & Plan Note (Signed)
At goal refilles and labs

## 2014-12-18 NOTE — Progress Notes (Signed)
   Subjective:    Patient ID: Meredith Bender, female    DOB: 05-15-1926, 81 y.o.   MRN: [id number]  HPI #1. Here for regular checkup. It has been many months since I have seen her. She says she's doing really well. She continues to live alone and the only thing that is troubling her is that her son has been talking to her about his perceived need that she start living in a nursing home. This upsets her quite a bit. He works and lives [place]of[place]town but was here last night. She said he kept going "on and on and on" about it. She is very clear that she wants to spend any remaining time on this or living independently. She has a community including several close friends, she still drowsy very short distances including the 4 miles to her best friend's house. She regularly attends church. She does ran grocery shopping, food preparation. #2. Hypertension. Needs refills. I had also asked her to come in for some blood work. #3. Bilateral knee pain. I had previously given her some corticosteroid-induced and switch her very beneficial. She would like to try that again today. She also has questions about how often she could receive those injections as they really helped her, almost 50% improvement for the first month or so after the injection.   Review of Systems  Constitutional: Negative for activity change, appetite change, fatigue and unexpected weight change.  Musculoskeletal:       B knee pain  Psychiatric/Behavioral: Negative for suicidal ideas, hallucinations, confusion, sleep disturbance, self-injury, dysphoric mood and agitation. The patient is not nervous/anxious and is not hyperactive.    See hpi. Additionally:    Objective:   Physical Exam  Constitutional: She is oriented to person, place, and time. She appears well-developed and well-nourished.  Eyes: Conjunctivae and EOM are normal. Pupils are equal, round, and reactive to light.  Neck: Normal range of motion. Neck supple. No thyromegaly  present.  Cardiovascular: Normal heart sounds and intact distal pulses.   Pulmonary/Chest: Effort normal and breath sounds normal. She has no wheezes.  Abdominal: Soft. Bowel sounds are normal.  Musculoskeletal: She exhibits no edema.  B knees crepitus, FROM. Medial and lateral joint line ttp. No effusion, no erythema. No skin lesions.  Neurological: She is alert and oriented to person, place, and time. She exhibits normal muscle tone. Coordination normal.  Psychiatric: She has a normal mood and affect. Her behavior is normal. Judgment and thought content normal.   INJECTION: Patient was given informed consent, signed copy in the chart. Appropriate time out was taken. Area prepped and draped in usual sterile fashion. 1 cc of methylprednisolone 40 mg/ml plus  4 cc of 1% lidocaine without epinephrine was injected into the Bilateral knees using a(n) anterior medial approach. The patient tolerated the procedure well. There were no complications. Post procedure instructions were given.        Assessment & Plan:

## 2014-12-18 NOTE — Assessment & Plan Note (Signed)
Check labs today.

## 2014-12-29 ENCOUNTER — Telehealth: Payer: Self-pay | Admitting: Family Medicine

## 2014-12-29 NOTE — Telephone Encounter (Signed)
Need to obtain a handicap placard.  Please complete and send to patient in mail.

## 2014-12-29 NOTE — Telephone Encounter (Signed)
Form placed in PCP box. Zimmerman Rumple, April D, CMA  

## 2015-01-04 ENCOUNTER — Other Ambulatory Visit: Payer: Self-pay | Admitting: Family Medicine

## 2015-01-05 NOTE — Telephone Encounter (Signed)
Calling to check on the status of this request. Thank you, Fonda Kinder, ASA

## 2015-01-06 NOTE — Telephone Encounter (Signed)
Pt called about refills again.  Would like to get them refilled soon

## 2015-01-07 ENCOUNTER — Other Ambulatory Visit: Payer: Self-pay | Admitting: *Deleted

## 2015-01-07 MED ORDER — TRAMADOL HCL 50 MG PO TABS: ORAL_TABLET | ORAL | Status: DC

## 2015-01-07 MED ORDER — VERAPAMIL HCL ER 240 MG PO CP24
ORAL_CAPSULE | ORAL | Status: DC
Start: 2015-01-07 — End: 2016-02-20

## 2015-01-07 NOTE — Telephone Encounter (Signed)
Pt called and would like a refill on her Verapamil and Tramadol. jw

## 2015-01-07 NOTE — Telephone Encounter (Signed)
I have filled out and placed in mail Meredith Bender

## 2015-01-07 NOTE — Telephone Encounter (Signed)
Pt calling about refills on tramadol and verapmil

## 2015-01-07 NOTE — Telephone Encounter (Signed)
rx called in. Ferris Tally, Salome Spotted, CMA

## 2015-01-07 NOTE — Telephone Encounter (Signed)
Dear [name] Team Please call in tramadol w refills as below [facility]! [name]

## 2015-01-23 ENCOUNTER — Telehealth: Payer: Self-pay | Admitting: Family Medicine

## 2015-01-23 MED ORDER — CEPHALEXIN 500 MG PO TABS: 500.0000 mg | ORAL_TABLET | Freq: Three times a day (TID) | ORAL | Status: DC

## 2015-01-23 NOTE — Telephone Encounter (Signed)
Pt called because she has a UTI and would like something called in for this. jw

## 2015-01-23 NOTE — Telephone Encounter (Signed)
Dear RN team [name] I have sent in a rx IF she is not much much better by Monday we need to know. IF she gets worse she needs to go to ED [facility]! [name]

## 2015-01-23 NOTE — Telephone Encounter (Signed)
Pt notified that antibiotic was sent to [facility] for her.  Pt to take one tab by mouth TID.  If not better by Monday to call clinic or get worse to go to ED.  Pt stated understanding.  Derl Barrow, RN

## 2015-02-25 ENCOUNTER — Encounter: Payer: Self-pay | Admitting: Family Medicine

## 2015-02-25 ENCOUNTER — Ambulatory Visit (INDEPENDENT_AMBULATORY_CARE_PROVIDER_SITE_OTHER): Payer: Medicare Other | Admitting: Family Medicine

## 2015-02-25 ENCOUNTER — Other Ambulatory Visit: Payer: Self-pay | Admitting: Family Medicine

## 2015-02-25 VITALS — BP 170/90 | HR 77 | Temp 98.2°F | Ht 65.0 in | Wt 104.1 lb

## 2015-02-25 DIAGNOSIS — R300 Dysuria: Secondary | ICD-10-CM | POA: Diagnosis not present

## 2015-02-25 DIAGNOSIS — N184 Chronic kidney disease, stage 4 (severe): Secondary | ICD-10-CM | POA: Diagnosis not present

## 2015-02-25 DIAGNOSIS — N950 Postmenopausal bleeding: Secondary | ICD-10-CM

## 2015-02-25 DIAGNOSIS — N814 Uterovaginal prolapse, unspecified: Secondary | ICD-10-CM | POA: Insufficient documentation

## 2015-02-25 DIAGNOSIS — N9489 Other specified conditions associated with female genital organs and menstrual cycle: Secondary | ICD-10-CM | POA: Diagnosis not present

## 2015-02-25 DIAGNOSIS — R42 Dizziness and giddiness: Secondary | ICD-10-CM | POA: Diagnosis not present

## 2015-02-25 DIAGNOSIS — N816 Rectocele: Secondary | ICD-10-CM | POA: Diagnosis not present

## 2015-02-25 DIAGNOSIS — I10 Essential (primary) hypertension: Secondary | ICD-10-CM | POA: Diagnosis not present

## 2015-02-25 DIAGNOSIS — N898 Other specified noninflammatory disorders of vagina: Secondary | ICD-10-CM | POA: Insufficient documentation

## 2015-02-25 DIAGNOSIS — N839 Noninflammatory disorder of ovary, fallopian tube and broad ligament, unspecified: Secondary | ICD-10-CM | POA: Diagnosis not present

## 2015-02-25 LAB — CBC WITH DIFFERENTIAL/PLATELET
Basophils Absolute: 0 10*3/uL (ref 0.0–0.1)
Basophils Relative: 0 % (ref 0–1)
Eosinophils Absolute: 0.3 10*3/uL (ref 0.0–0.7)
Eosinophils Relative: 3 % (ref 0–5)
HCT: 38.7 % (ref 36.0–46.0)
HEMOGLOBIN: 13.1 g/dL (ref 12.0–15.0)
LYMPHS ABS: 1 10*3/uL (ref 0.7–4.0)
Lymphocytes Relative: 11 % — ABNORMAL LOW (ref 12–46)
MCH: 29.2 pg (ref 26.0–34.0)
MCHC: 33.9 g/dL (ref 30.0–36.0)
MCV: 86.2 fL (ref 78.0–100.0)
MPV: 11.2 fL (ref 8.6–12.4)
Monocytes Absolute: 0.6 10*3/uL (ref 0.1–1.0)
Monocytes Relative: 6 % (ref 3–12)
Neutro Abs: 7.4 10*3/uL (ref 1.7–7.7)
Neutrophils Relative %: 80 % — ABNORMAL HIGH (ref 43–77)
PLATELETS: 286 10*3/uL (ref 150–400)
RBC: 4.49 MIL/uL (ref 3.87–5.11)
RDW: 14.3 % (ref 11.5–15.5)
WBC: 9.3 10*3/uL (ref 4.0–10.5)

## 2015-02-25 LAB — POCT URINALYSIS DIPSTICK
Bilirubin, UA: NEGATIVE
Glucose, UA: NEGATIVE
NITRITE UA: POSITIVE
PH UA: 6.5
Protein, UA: >=300
Spec Grav, UA: 1.025
Urobilinogen, UA: 0.2

## 2015-02-25 LAB — BASIC METABOLIC PANEL
BUN: 30 mg/dL — ABNORMAL HIGH (ref 7–25)
CO2: 24 mmol/L (ref 20–31)
CREATININE: 2.01 mg/dL — AB (ref 0.60–0.88)
Calcium: 9 mg/dL (ref 8.6–10.4)
Chloride: 107 mmol/L (ref 98–110)
Glucose, Bld: 98 mg/dL (ref 65–99)
Potassium: 4.3 mmol/L (ref 3.5–5.3)
Sodium: 141 mmol/L (ref 135–146)

## 2015-02-25 LAB — POCT UA - MICROSCOPIC ONLY

## 2015-02-25 NOTE — Patient Instructions (Signed)
My nurse is scheduling your MRI. He should hear from either my office or the GYN office regarding your appointment within the next 48 hours. If you have not heard, please call and let me know that. I will send you a note about your blood work unless something is abnormal in which case I will call you. I have requested the records from your kidney doctor's office and once I get those I will have my nurse call you about your blood pressure. It may be that there is nothing additional to add, although perhaps we can increase the medicine he gave you to a higher dose. He should hear from [place] regarding that in the next 24 hours. Please don't hesitate to call the office and leave me a message if you have any questions or concerns.

## 2015-02-25 NOTE — Assessment & Plan Note (Signed)
Evidently her kidney doctor added 5 mg of amlodipine to her existing regimen which included verapamil 240 mg. I'm trying to get notes from that office visit. She says she's taking all of her medicines regularly and her pressure today is quite high.

## 2015-02-25 NOTE — Assessment & Plan Note (Addendum)
Going to wait till I see the notes from the renal doctor before I do any changing of her medications. She is asymptomatic today from this pressure reading. I did do a reprieve which was 170/90.

## 2015-02-25 NOTE — Assessment & Plan Note (Addendum)
She reports having some blood staining on her underwear over the last few weeks. She thought it might be related to a urinary tract infection which we have treated and her symptoms of dysuria have resolved but she still seeing the blood on her underwear. Pelvic exam today reveals mass in the vagina. This could be consistent consistent with prolapsed uterus, although the tissue looks pretty abnormal, and by history she has had hysterectomy. I'm not sure what this is. Also history of some type of rectocele surgery.. Certainly I think this is the source of her bleeding. I'm setting her up to see gynecology as an urgent appointment. I'm also going to check a CBC today to see if she's had any significant blood loss.  VAGINAL MASS:'m not sure what this mass is. At first I thought it was uterine prolapse but when her daughter told me she had a hysterectomy then this seems unlikely. Evidently she's also had a rectocele repair with some complication of a flexible sigmoidoscope attempt in the last couple of years. It's all very confusing but I think we need to work this up pretty aggressively. I'm going to check CBC today to look for any significant anemia. I put in an urgent referral to GYN and go ahead and get a pelvic MRI. She would not be able to tolerate a transvaginal ultrasound in my opinion and with the history of rectocele I think we need to go ahead and look at all of the anatomy pretty carefully.

## 2015-02-25 NOTE — Assessment & Plan Note (Signed)
I'm not sure what this mass is. At first I thought it was uterine prolapse but when her daughter told me she had a hysterectomy then this seems unlikely. Evidently she's also had a rectocele repair with some complication of a flexible sigmoidoscope attempt in the last couple of years. It's all very confusing but I think we need to work this up pretty aggressively. I'm going to check CBC today to look for any significant anemia. I put in an urgent referral to GYN anabolic go ahead and get a pelvic MRI. She would not be able to tolerate a transvaginal ultrasound in my opinion and with the history of rectocele I think we need to go ahead and look at all of the anatomy pretty carefully.

## 2015-02-25 NOTE — Progress Notes (Signed)
   Subjective:    Patient ID: Meredith Bender, female    DOB: September 29, 1925, 81 y.o.   MRN: [id number]  HPI  #1. Here with her daughter today for follow-up of some bleeding. She seeing some spotting on her underwear and is not sure if it's related to UTI, vaginal bleeding, rectal bleeding. We had treated her for UTI and her symptoms of dysuria really never got better, since she's been off anabolic they've not gotten worse however. She's had no fever. No acute abdominal pain. No problems urinating. No increased urination. She's not seeing any frank blood in her urine or stool. #3. Was recently seen by her renal doctor and they added 5 mg of amlodipine and told her that her kidney function was worse. She reports taking that in addition to the medications I had her on as well for her blood pressure.  PERTINENT  PMH / PSH: I have reviewed the patient's medications, allergies, past medical and surgical history. Pertinent findings that relate to today's visit / issues include: She evidently had hysterectomy for uterine prolapse many many years ago. She also by report had a rectocele repair by Dr. Lucia Gaskins. Her daughter says when she was being evaluated for C. difficile treatment they did a flexible sigmoidoscopy and had some type of complication with her rectum. Review of Systems See history of present illness    Objective:   Physical Exam Vital signs are reviewed and noted extremely elevated blood pressure. GEN.: Well-developed female no acute distress CV: Regular rate and rhythm no  GU: Externally she has atrophic genitalia. There is a retreating mass from the vagina that is very friable. There's also a small amount of bleeding, from this mass. I did not do a bimanual secondary to discomfort and I did not do a rectal exam. Her pelvis externally is nontender to palpation and I feel no overt mass here in the bladder seems to be nontender and not enlarged.      Assessment & Plan:

## 2015-02-26 ENCOUNTER — Telehealth: Payer: Self-pay | Admitting: Family Medicine

## 2015-02-26 ENCOUNTER — Encounter: Payer: Self-pay | Admitting: Family Medicine

## 2015-02-26 NOTE — Telephone Encounter (Signed)
[id number] Notified her daughter [name]) of normal results. Meredith Bender

## 2015-02-28 ENCOUNTER — Ambulatory Visit
Admission: RE | Admit: 2015-02-28 | Discharge: 2015-02-28 | Disposition: A | Discharge status: Home / Self Care | Payer: Medicare Other | Source: Ambulatory Visit | Attending: Family Medicine | Admitting: Family Medicine

## 2015-02-28 DIAGNOSIS — N838 Other noninflammatory disorders of ovary, fallopian tube and broad ligament: Secondary | ICD-10-CM | POA: Diagnosis not present

## 2015-02-28 DIAGNOSIS — N950 Postmenopausal bleeding: Secondary | ICD-10-CM

## 2015-02-28 DIAGNOSIS — N814 Uterovaginal prolapse, unspecified: Secondary | ICD-10-CM

## 2015-02-28 DIAGNOSIS — N184 Chronic kidney disease, stage 4 (severe): Secondary | ICD-10-CM

## 2015-03-02 ENCOUNTER — Telehealth: Payer: Self-pay | Admitting: *Deleted

## 2015-03-02 ENCOUNTER — Ambulatory Visit (INDEPENDENT_AMBULATORY_CARE_PROVIDER_SITE_OTHER): Payer: Medicare Other | Admitting: Family Medicine

## 2015-03-02 DIAGNOSIS — N839 Noninflammatory disorder of ovary, fallopian tube and broad ligament, unspecified: Secondary | ICD-10-CM

## 2015-03-02 DIAGNOSIS — N950 Postmenopausal bleeding: Secondary | ICD-10-CM

## 2015-03-02 DIAGNOSIS — N838 Other noninflammatory disorders of ovary, fallopian tube and broad ligament: Secondary | ICD-10-CM

## 2015-03-02 DIAGNOSIS — N898 Other specified noninflammatory disorders of vagina: Secondary | ICD-10-CM

## 2015-03-02 DIAGNOSIS — C562 Malignant neoplasm of left ovary: Secondary | ICD-10-CM

## 2015-03-02 DIAGNOSIS — N9489 Other specified conditions associated with female genital organs and menstrual cycle: Secondary | ICD-10-CM

## 2015-03-02 NOTE — Telephone Encounter (Signed)
Received call from Dr. Nori Riis requesting new patient consult with Dr. Denman George. Patient scheduled Friday, 03/06/15 at 11am (with 10:30am arrival time). Dr. Nori Riis agreeable to notify patient of appt.

## 2015-03-02 NOTE — Patient Instructions (Signed)
Appt with Dr. Denman George at the [facility]. Go in main door of [place] which is beside main entrance and tell them you have an ppt with Dr. Denman George. Your appt is at 11:00 AM but they ask you to arrive at 10:30 Am to fill out paperwork. They have access to allof my notes. Their number is [phone]

## 2015-03-02 NOTE — Progress Notes (Addendum)
Patient ID: KASHLYN SALINAS, female   DOB: October 03, 1925, 81 y.o.   MRN: [id number] She is here with her daughter (and HCPOA) Tomasita Morrow at my request to discuss MRI results.   Discussed findings on pelvic MRI. Suspicious for ovarian cancer with complex adnexal mass on left (previously known--incidental finding on CT scan, but never really worked up as she had so many other medical issues at that time (hip fx, Severe C Diff etc) also with vaginal mass identified, possibly adjacent or concurrent with right ureter.  Vaginal mass is grossly visible on inspection of perineum and most likely responsible for at least a portion of vaginal blood spotting.   She would like to be evaluated by specialist. I had previously set her up with [facility] with appt scheduled this Wednesday, but given findings on MRI I think we will proceed per her request to [place]. She wished evaluation ASAP. Daughter requests Dr. Denman George from [facility].  I have scheduled that, they have access to EPIC and all notes / results. Daughter wil cancel GYN at [facility] appt.  Greater than 50% of our 35 minute office visit was spent in counseling and education regarding these issues. Appt made with Dr Denman George and I will get CA 125 drawn today for them. Daughter has urgent contact info for me.

## 2015-03-03 LAB — CA 125: CA 125: 108 U/mL — ABNORMAL HIGH (ref <35)

## 2015-03-04 ENCOUNTER — Encounter: Payer: Medicare Other | Admitting: Obstetrics & Gynecology

## 2015-03-05 ENCOUNTER — Encounter: Payer: Medicare Other | Admitting: Obstetrics & Gynecology

## 2015-03-06 ENCOUNTER — Ambulatory Visit: Payer: Medicare Other | Attending: Gynecologic Oncology | Admitting: Gynecologic Oncology

## 2015-03-06 ENCOUNTER — Encounter: Payer: Self-pay | Admitting: Gynecologic Oncology

## 2015-03-06 VITALS — BP 182/80 | HR 80 | Temp 98.0°F | Resp 19 | Ht 62.0 in | Wt 105.7 lb

## 2015-03-06 DIAGNOSIS — R1900 Intra-abdominal and pelvic swelling, mass and lump, unspecified site: Secondary | ICD-10-CM | POA: Insufficient documentation

## 2015-03-06 DIAGNOSIS — N368 Other specified disorders of urethra: Secondary | ICD-10-CM

## 2015-03-06 DIAGNOSIS — N8320 Unspecified ovarian cysts: Secondary | ICD-10-CM | POA: Diagnosis not present

## 2015-03-06 DIAGNOSIS — N369 Urethral disorder, unspecified: Secondary | ICD-10-CM | POA: Insufficient documentation

## 2015-03-06 DIAGNOSIS — N83202 Unspecified ovarian cyst, left side: Secondary | ICD-10-CM

## 2015-03-06 DIAGNOSIS — C519 Malignant neoplasm of vulva, unspecified: Secondary | ICD-10-CM | POA: Diagnosis not present

## 2015-03-06 DIAGNOSIS — N901 Moderate vulvar dysplasia: Secondary | ICD-10-CM | POA: Diagnosis not present

## 2015-03-06 DIAGNOSIS — N9089 Other specified noninflammatory disorders of vulva and perineum: Secondary | ICD-10-CM

## 2015-03-06 NOTE — Patient Instructions (Signed)
Plan to meet with Radiation Oncologist Dr. Sondra Come.  Please call GYN Oncology at [phone] with any questions or concerns

## 2015-03-06 NOTE — Progress Notes (Signed)
Consult Note: Gyn-Onc  Consult was requested by Dr. Nori Riis for the evaluation of Meredith Bender 81 y.o. female  CC:  Chief Complaint  Patient presents with  . pelvic mass    New consult  . urethral mass    Assessment/Plan:  Meredith Bender  is a 81 y.o.  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  Will followup today's confirmatory biopsy.  Have empirically made referral to Dr Sondra Come for primary radiation. I discussed with the patient and her daughter that vulvar cancers involving the urethra are typically treated primarily with radiation in order to preserve urinary continence. I discussed that we typically precede radiation to the vulva with bilateral inguinal lymphadenectomy to stage the cancer. However, this is associated with morbidity of lymphedema and wound breakdown. Ms Vandervoort and her daughter are not interested in a surgery of that degree of radicality at her advanced age. Therefore we will elect to clinically stage her with a PET/CT. If the groin nodes are clinically positive, we would recommend including them in the radiation field. They are not bulky on exam. If she has distant metastases on PET/CT I would recommend still going forward with primary radiation to the vulva in order to palliate the impending symptoms of urinary and urethral obstruction, but then follow that with either systemic therapy or hospice (pending patient's wishes).  With respect to the left ovarian cysts, I believe that these are most likely benign. I personally reviewed the films from the MRI on 02/28/15 and these cysts do not appear malignant in nature or related to the vulvar/urethral process and are not associated with apparent (on pelvic MRI) ascites or carcionomatosis. The elevated CA 125 is likely secondary to her history of renal failure. She had an elevated CA 125 a year ago (78, compared to 108 now). I would  expect more significant findings on imaging if she had ovarian cancer (untreated) for a 12 month duration. We will followup the PET/CT images, and if the ovarian cysts are non-avid, and there is no suggestion of peritoneal disease, it requires no further intervention or workup.   HPI: Meredith Bender is a very pleasant 81 year old G5P5 who is seen in consultation at the request of Dr [name] for a urethral/vaginal mass and left ovarian cysts. The patient reports a 1 year (or more) history of bloody vaginal discharge. It has been stable in nature. Approximately 2 years ago, she was admitted to the hospital with a fractured hip and developed c.diff. Imaging was obtained at that time which revealed a urethral lesion (no exam was done). The patient and her family elected to not further workup the findings.  She presented to her doctor, Dr Nori Riis, in July, 2016 for symptoms of persistent vaginal bleeding (light). Dr Nori Riis noted a vaginal mass on examination. A pelvic MRI was ordered and performed on 02/28/15. It revealed a 2.3x1.9x3.9cm   Mass involving the lower half of the anterior vaginal wall, extending to the level of the introitus. The right ovary was not seen. The left ovary contained a complex cystic lesion measuring 4.1x3.2x3.4cm with multiple internal septations. No ascites was seen.   A CA 125 was drawn on 02/25/15 and was elevated at 108 (it had been drawn on 07/20/13, with a different lab assay that typically runs lower than the current [facility] assay, and was 77.8).  The patient lives alone, she is in attendance  with her daughter. She has essential HTN and CKD (20% renal function). Her past surgeries include an ex lap for repair of rectal prolapse (severe) and abdominal hysterectomy (without oophorectomy) 50 years ago. She reports this was to "stop her from having more children" and she did not have a cancer diagnosis. She has also had an appendectomy and bilateral inguinal hernia repairs.  She denies  symptoms of abdominal bloating, early satiety, abdominal or back pain. She has some irritation with urination.  Current Meds:  Outpatient Encounter Prescriptions as of 03/06/2015  Medication Sig  . amLODipine (NORVASC) 5 MG tablet TK 1 T PO  HS  . hydrALAZINE (APRESOLINE) 25 MG tablet TAKE 1 TABLET BY MOUTH THREE TIMES DAILY  . loratadine (CLARITIN) 10 MG tablet Take 1 tablet (10 mg total) by mouth daily.  . Probiotic Product (PROBIOTIC DAILY) CAPS florastar 250 mh take one by mouth daily  . traMADol (ULTRAM) 50 MG tablet STake by mouth oneor two tablets every 8 hours max of 6 tabs in 24 hours  . verapamil (VERELAN PM) 240 MG 24 hr capsule Take one by mouth daily  . vitamin E (VITAMIN E) 400 UNIT capsule Take 400 Units by mouth daily.    . Cholecalciferol (D3-1000) 1000 UNITS capsule Take 2,000 Units by mouth daily.  . [DISCONTINUED] Cephalexin 500 MG tablet Take 1 tablet (500 mg total) by mouth 3 (three) times daily.  . [DISCONTINUED] furosemide (LASIX) 20 MG tablet Take 1 tablet (20 mg total) by mouth daily.  . [DISCONTINUED] omeprazole (PRILOSEC) 20 MG capsule Take 20 mg by mouth daily.     No facility-administered encounter medications on file as of 03/06/2015.    Allergy:  Allergies  Allergen Reactions  . Codeine Nausea Only  . Penicillins Rash    Pt does not remember how long ago or severity of reaction.     Social Hx:   Social History   Social History  . Marital Status: Married    Spouse Name: N/A  . Number of Children: N/A  . Years of Education: N/A   Occupational History  . Not on file.   Social History Main Topics  . Smoking status: Never Smoker   . Smokeless tobacco: Not on file  . Alcohol Use: No  . Drug Use: No  . Sexual Activity: Not on file   Other Topics Concern  . Not on file   Social History Narrative    Past Surgical Hx:  Past Surgical History  Procedure Laterality Date  . Hernia repair    . Appendectomy    . Tubal ligation    . Abdominal  hysterectomy    . Intramedullary (im) nail intertrochanteric Right 08/29/2013    Procedure: INTRAMEDULLARY (IM) NAIL INTERTROCHANTRIC;  Surgeon: Nita Sells, MD;  Location: [facility];  Service: Orthopedics;  Laterality: Right;    Past Medical Hx:  Past Medical History  Diagnosis Date  . Hypertension   . CVA (cerebral infarction)     Remote bilateral cerebellar infarcts on CT 8/14  . Dyslipidemia 02/28/2013  . Anginal pain   . Chronic kidney disease   . Arthritis   . COPD (chronic obstructive pulmonary disease)   . Stroke 1978    no deficits  . Intertrochanteric fracture of right femur     Past Gynecological History:  SVD x 5. Hysterctomy for benign indications 50 years ago.  No LMP recorded. Patient has had a hysterectomy.  Family Hx:  Family History  Problem Relation Age of  Onset  . Diabetes Mother   . Uterine cancer Sister     Review of Systems:  Constitutional  Feels well,    ENT Normal appearing ears and nares bilaterally Skin/Breast  No rash, sores, jaundice, itching, dryness Cardiovascular  No chest pain, shortness of breath, or edema  Pulmonary  No cough or wheeze.  Gastro Intestinal  No nausea, vomitting, or diarrhoea. No bright red blood per rectum, no abdominal pain, change in bowel movement, or constipation.  Genito Urinary  No frequency, urgency, dysuria, see HPI Musculo Skeletal  No myalgia, arthralgia, joint swelling or pain  Neurologic  No weakness, numbness, change in gait,  Psychology  No depression, anxiety, insomnia.   Vitals:  Blood pressure 182/80, pulse 80, temperature 98 F (36.7 C), temperature source Oral, resp. rate 19, height 5\' 2"  (1.575 m), weight 105 lb 11.2 oz (47.945 kg), SpO2 98 %.  Physical Exam: WD in NAD Neck  Supple NROM, without any enlargements.  Lymph Node Survey No cervical supraclavicular or inguinal adenopathy Cardiovascular  Pulse normal rate, regularity and rhythm. S1 and S2 normal.  Lungs  Clear to  auscultation bilateraly, without wheezes/crackles/rhonchi. Good air movement.  Skin  No rash/lesions/breakdown  Psychiatry  Alert and oriented to person, place, and time  Abdomen  Normoactive bowel sounds, abdomen soft, non-tender and thin without evidence of hernia.  Back No CVA tenderness Genito Urinary  Vulva/vagina: Erythematous nodular lesion on anterior labia minora spreading from left to right and extending down to involve the urethral meatus (measuring 4cm). The posterior surface (vaginal surface) of the urethra and distal 3cm of vagina include a papillary/exophytic friable mass growing from the underside of the urethra and enroaching on the meatus. The exophytic mass measures 3cm. Biopsies were taken (see below).  Bladder/urethra:  No lesions or masses, well supported bladder  Vagina: Distal 3 cm of anterior vagina includes exophytic mass (see above)  Cervix: surgically absent  Uterus: absent  Adnexa: no palpabe masses. Rectal  deferred Extremities  No bilateral cyanosis, clubbing or edema.  PROCEDURE NOTE The patient provided verbal consent. The vulva and vaginal mass were cleaned with betadine. 2cc of 1% plain lidocaine was infiltrated into the left anterior labia minora and vaginal mass. A 109mm punch was taken from a representative central area of the labial lesion. A representative segment of the vaginal lesion was grasped with the tischler forceps to biopsy it also. Hemostasis was achieved with silver nitrate and monsels solution. The patient tolerated the procedure well.    Donaciano Eva, MD   03/06/2015, 11:27 AM

## 2015-03-13 ENCOUNTER — Telehealth: Payer: Self-pay | Admitting: *Deleted

## 2015-03-13 NOTE — Telephone Encounter (Signed)
Received call from daughter Rickie wanted to know results of pt's biopsy.  Message sent to Dr. Denman George and Cyril Mourning, desk nurse. Rickie's  Phone    [phone].

## 2015-03-17 ENCOUNTER — Telehealth: Payer: Self-pay | Admitting: Gynecologic Oncology

## 2015-03-17 ENCOUNTER — Other Ambulatory Visit: Payer: Self-pay | Admitting: *Deleted

## 2015-03-17 DIAGNOSIS — C519 Malignant neoplasm of vulva, unspecified: Secondary | ICD-10-CM

## 2015-03-17 NOTE — Telephone Encounter (Signed)
Spoke with patient's daughter about biopsy results.  Advised of Dr. Serita Grit recommendations for PET scan to evaluate for metastatic disease.  PET scan scheduled for 9/9.  Instructions discussed.  Dr. Sondra Come appts discussed as well.  Patient's daughter asking about the impact of the treatments of her mother and other concerns about the treatments/care, etc.  Advised that the social worker would be contacted to offer additional information and support.  Advised to call for any questions or concerns.

## 2015-03-18 ENCOUNTER — Encounter: Payer: Self-pay | Admitting: Radiation Oncology

## 2015-03-18 NOTE — Progress Notes (Addendum)
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   Meredith Bender presented with a 1 year (or more) history of bloody vaginal discharge.  Biopsies revealed:   03/06/15 Diagnosis 1. Vulva, biopsy, anterior labia minora - HIGH GRADE VULVAR INTRAEPITHELIAL NEOPLASIA, VIN II. - NO INVASIVE CARCINOMA IDENTIFIED. 2. Vulva, biopsy, posterior urethral meatus - POORLY DIFFERENTIATED CARCINOMA. - SEE MICROSCOPIC DESCRIPTION.  Past/Anticipated interventions by Gyn/Onc surgery, if any:   Past/Anticipated interventions by medical oncology, if any: no  Weight changes, if any: yes - has lost about 15 lbs in the last two years due to having C Diff x2 and breaking her right hip.  Bowel/Bladder complaints, if any: denies bowel issues, reports urinary urgency, reports she has 20% function of her kidneys  Nausea/Vomiting, if any: no  Pain issues, if any:  Yes - reports lower back pain and lower abdomen pain.  She takes tramadol three times a day.  SAFETY ISSUES:  Prior radiation? no  Pacemaker/ICD? no  Possible current pregnancy? no  Is the patient on methotrexate? no  Current Complaints / other details:  PET scan scheduled for 03/27/15.  She is here with her daughter.  She has 4 children.  She reports having bright red vaginal bleeding and is using 5-6 thin pads per day.  She reports having a "gush" of bright red blood yesterday morning.  BP 161/64 mmHg  Pulse 82  Temp(Src) 98.1 F (36.7 C) (Oral)  Resp 16  Ht 5\' 2"  (1.575 m)  Wt 104 lb 4.8 oz (47.31 kg)  BMI 19.07 kg/m2  SpO2 97%

## 2015-03-19 ENCOUNTER — Ambulatory Visit
Admission: RE | Admit: 2015-03-19 | Discharge: 2015-03-19 | Disposition: A | Discharge status: Home / Self Care | Payer: Medicare Other | Source: Ambulatory Visit | Attending: Radiation Oncology | Admitting: Radiation Oncology

## 2015-03-19 ENCOUNTER — Encounter: Payer: Self-pay | Admitting: Radiation Oncology

## 2015-03-19 VITALS — BP 161/64 | HR 82 | Temp 98.1°F | Resp 16 | Ht 62.0 in | Wt 104.3 lb

## 2015-03-19 DIAGNOSIS — N189 Chronic kidney disease, unspecified: Secondary | ICD-10-CM | POA: Diagnosis not present

## 2015-03-19 DIAGNOSIS — C519 Malignant neoplasm of vulva, unspecified: Secondary | ICD-10-CM | POA: Diagnosis not present

## 2015-03-19 DIAGNOSIS — C52 Malignant neoplasm of vagina: Secondary | ICD-10-CM | POA: Insufficient documentation

## 2015-03-19 DIAGNOSIS — Z8673 Personal history of transient ischemic attack (TIA), and cerebral infarction without residual deficits: Secondary | ICD-10-CM | POA: Insufficient documentation

## 2015-03-19 DIAGNOSIS — Z510 Encounter for antineoplastic radiation therapy: Secondary | ICD-10-CM | POA: Insufficient documentation

## 2015-03-19 DIAGNOSIS — C511 Malignant neoplasm of labium minus: Secondary | ICD-10-CM | POA: Diagnosis not present

## 2015-03-19 DIAGNOSIS — J449 Chronic obstructive pulmonary disease, unspecified: Secondary | ICD-10-CM | POA: Insufficient documentation

## 2015-03-19 DIAGNOSIS — C680 Malignant neoplasm of urethra: Secondary | ICD-10-CM | POA: Insufficient documentation

## 2015-03-19 DIAGNOSIS — I129 Hypertensive chronic kidney disease with stage 1 through stage 4 chronic kidney disease, or unspecified chronic kidney disease: Secondary | ICD-10-CM | POA: Insufficient documentation

## 2015-03-19 HISTORY — DX: Malignant neoplasm of vulva, unspecified: C51.9

## 2015-03-19 NOTE — Progress Notes (Signed)
Please see the Nurse Progress Note in the MD Initial Consult Encounter for this patient. 

## 2015-03-19 NOTE — Progress Notes (Signed)
Radiation Oncology         [phone] ________________________________  Initial Outpatient Consultation  Name: Meredith Bender MRN: [id number]  Date: 03/19/2015  DOB: [birth year]  NW:GNFA Meredith Riis, MD  Meredith La, MD   REFERRING PHYSICIAN: [name], MD  DIAGNOSIS:  Stage II vulvar cancer (anterior) involving distal urethra and vagina and anterior labia minora   HISTORY OF PRESENT ILLNESS:Meredith Bender is a 81 y.o. female who is presenting to clinic in regards to her Stage II vulvar cancer (anterior) involving distal urethra and vagina and anterior labia minora (Rt>Lt).she is seen out of the courtesy of Dr. Denman Bender. Given the patient's advanced age and medical issues the patient as well as her daughter did not wish to be aggressive with her management with conceivably would include surgery as well as inguinal lymphadenectomy. Meredith Bender  recommended that she see Dr. Sondra Bender in discussing radiation therapy treatment options. Her most concerning symptom of vaginal bleeding has occurred for around one year. To address this issue, she wear pads. She goes through five to six pads a day. Last week, her back and the lower part of her abdomen have started hurting. She administers the medical Tramadol 3-4 times daily. A new symptom of having to urinate at night has started occuring recently. "It kinda feels like a urinary track infection," the patient stated in regards to the burning occurs every time that she urinates. It is difficult for her to control her bladder, but she denies constant leaking. She additionally denies symptoms of constipation, diahrrea, or any other irregular bowel movements. The daughter vocalized that the patient was hard of hearing at the beginning of the appointment. The patient projected a healthy mental status and was accompanied by her daughter for today's radiation oncology visit. Dr. Nori Bender of [facility] is the patient's current family doctor and is aware of the patient's current state  of health.  PREVIOUS RADIATION THERAPY: No  PAST MEDICAL HISTORY:  has a past medical history of Hypertension; CVA (cerebral infarction); Dyslipidemia (02/28/2013); Anginal pain; Chronic kidney disease; Arthritis; COPD (chronic obstructive pulmonary disease); Stroke (1978); Intertrochanteric fracture of right femur; and Vulvar cancer.    PAST SURGICAL HISTORY: Broken hip and rehabilitation in January of 2016 Past Surgical History  Procedure Laterality Date  . Hernia repair    . Appendectomy    . Tubal ligation    . Abdominal hysterectomy    . Intramedullary (im) nail intertrochanteric Right 08/29/2013    Procedure: INTRAMEDULLARY (IM) NAIL INTERTROCHANTRIC;  Surgeon: Nita Sells, MD;  Location: [facility];  Service: Orthopedics;  Laterality: Right;    FAMILY HISTORY: family history includes Diabetes in her mother; Uterine cancer in her sister.  SOCIAL HISTORY:  reports that she has never smoked. She does not have any smokeless tobacco history on file. She reports that she does not drink alcohol or use illicit drugs.  ALLERGIES: Codeine and Penicillins  MEDICATIONS:  Current Outpatient Prescriptions  Medication Sig Dispense Refill  . amLODipine (NORVASC) 5 MG tablet TK 1 T PO  HS  11  . Cholecalciferol (D3-1000) 1000 UNITS capsule Take 2,000 Units by mouth daily.    . hydrALAZINE (APRESOLINE) 25 MG tablet TAKE 1 TABLET BY MOUTH THREE TIMES DAILY 270 tablet 0  . loratadine (CLARITIN) 10 MG tablet Take 1 tablet (10 mg total) by mouth daily. 90 tablet 3  . omeprazole (PRILOSEC) 20 MG capsule Take 20 mg by mouth daily.    . Probiotic Product (PROBIOTIC DAILY) CAPS  florastar 250 mh take one by mouth daily 100 capsule 3  . traMADol (ULTRAM) 50 MG tablet STake by mouth oneor two tablets every 8 hours max of 6 tabs in 24 hours 180 tablet 5  . verapamil (VERELAN PM) 240 MG 24 hr capsule Take one by mouth daily 90 capsule 3  . vitamin E (VITAMIN E) 400 UNIT capsule Take 400 Units by mouth  daily.       No current facility-administered medications for this encounter.    REVIEW OF SYSTEMS:  A 15 point review of systems is documented in the electronic medical record. This was obtained by the nursing staff. However, I reviewed this with the patient to discuss relevant findings and make appropriate changes.  Pertinent items are noted in HPI.   PHYSICAL EXAM:  height is 5\' 2"  (1.575 m) and weight is 104 lb 4.8 oz (47.31 kg). Her oral temperature is 98.1 F (36.7 C). Her blood pressure is 161/64 and her pulse is 82. Her respiration is 16 and oxygen saturation is 97%.  The patient is alert and oriented x3.  Lungs are clear. Heart has regular rate and rhythm. No palpable cervical, supraclavicular, or axillary adenopathy. Mildly hard of hearing. Abdomen soft and somewhat distended. The right inguinal area is clear. The left inguinal area shows possible lymphadenopathy measuring approximately 2.5 x 1 cm in size.    Vulva/vagina: Erythematous nodular lesion on anterior labia minora spreading from left to right and extending down to involve the urethral meatus (measuring 4cm). The posterior surface (vaginal surface) of the urethra and distal 3cm of vagina include a papillary/exophytic friable mass growing from the underside of the urethra and enroaching on the meatus. The exophytic mass measures 3cm. Vagina: Distal 3 cm of anterior vagina includes exophytic mass. The lesion bled easily after examination and required short term direct pressure dressing to control bleeding.    ECOG = 1  LABORATORY DATA:  Lab Results  Component Value Date   WBC 9.3 02/25/2015   HGB 13.1 02/25/2015   HCT 38.7 02/25/2015   MCV 86.2 02/25/2015   PLT 286 02/25/2015   NEUTROABS 7.4 02/25/2015   Lab Results  Component Value Date   NA 141 02/25/2015   K 4.3 02/25/2015   CL 107 02/25/2015   CO2 24 02/25/2015   GLUCOSE 98 02/25/2015   CREATININE 2.01* 02/25/2015   CALCIUM 9.0 02/25/2015       RADIOGRAPHY: Mr Pelvis Wo Contrast  02/28/2015   CLINICAL DATA:  81 year old female with vaginal mass. Postmenopausal bleeding. History of uterine prolapse and rectocele repair.  EXAM: MRI PELVIS WITHOUT CONTRAST  TECHNIQUE: Multiplanar multisequence MR imaging of the pelvis was performed. No intravenous contrast was administered.  COMPARISON:  CT of the abdomen and pelvis 07/19/2013.  FINDINGS: There is a poorly defined mass which is best demonstrated on axial fat saturated T2 weighted images (series 7), which appears intimately associated with both the right side of the anterior wall of the vagina and the adjacent urethra (but does not appear to arise from the urethra itself). While the proximal urethra appears normal, as it extends adjacent to the mass in the right side of the anterior wall of the vagina, the urethra be comes distorted. This mass is poorly demonstrated on today's noncontrast MRI, but is estimated to measure at least 2.3 x 1.9 x 3.9 cm (image 26 of series 7) and appears to involve the lower half of the anterior vaginal wall, extending to the level of the  introitus. Status post hysterectomy. Right ovary is not confidently identified and is presumably atrophic. In the left adnexal region there is a complex cystic appearing lesion measuring approximately 4.1 x 3.2 x 3.4 cm which has multiple internal septations, as well as a smaller adjacent cystic lesion which measures 2.2 x 1.9 x 2.2 cm. Urinary bladder is unremarkable in appearance. No significant volume of ascites in the visualized pelvis. Multiple lateral meningoceles are noted throughout the visualized lumbar spine. 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  IMPRESSION: 1. There does appear to be a soft tissue mass involving the right side of the anterior wall of the vagina which appears intimately associated with the distal aspect of the urethra, as detailed above. This is poorly demonstrated on today's noncontrast MRI examination, and is  statistically most likely to represent a squamous cell carcinoma. 2. Multiple cystic appearing lesions in the left adnexal region, which appears significantly larger than prior study 07/19/2013, and one of these has multiple internal septations. In this postmenopausal female, the possibility of ovarian neoplasm should be considered. This is poorly characterized on today's noncontrast MRI examination, and further characterization with nonemergent pelvic ultrasound is recommended in the near future to assess for internal vascularity that may suggest neoplasm. 3. Additional incidental findings, as above.   Electronically Signed   By: Vinnie Langton M.D.   On: 02/28/2015 12:36      IMPRESSION: Anissa Isley is a 81 year old female who is presenting to clinic in regards to her Stage II vulvar cancer (anterior) involving distal urethra and vagina and anterior labia minora (Rt>Lt). Possible treatment options discussed include surgery and radiation therapy treatments. The risks and benefits of both surgery and radiation therapy treatments were discussed. The patient and daughter are aware of the PET scan to take place on September 9th of 2016 at 1:00pm and that treatment options will vary depending on the results of this PET scan. In regards to radiation therapy, common symptoms to expect and healthy methods to manage these symptoms if they are to occur were reviewed. The traditional process in which a patient will experience during radiation therapy was reviewed.  PLAN: The patient has been advised to complete the PET scan as scheduled, on September 9th, 2016 at 1:00pm and options for treatment will be discussed in consideration to the results of this PET scan. The patient understands the risks and benefits of radiation therapy treatments. The patient understands the difference, purpose, and definitions of both a PET and CT scan. The patient is advised of CT scan and simulation planning session to take place on  Tuesday, September 6th of 2016 at 11:00am. She has also been made aware of her follow-up appointment with radiation oncology to take place as scheduled. All vocalized questions and concerns have been addressed. If the patient develops and further questions or concerns in regards to her treatment or recovery, she has been encouraged to contact Dr. Sondra Come, MD, PhD. Consent documentation was reviewed and signed. The patient is undergoing simulation prior to her PET scan to facilitate initiation of treatment. Treatments will likely be directed at the central lower pelvis area and possibly inguinal areas depending on upcoming PET scan.      This document serves as a record of services personally performed by Gery Pray, MD. It was created on his behalf by Lenn Cal, a trained medical scribe. The creation of this record is based on the scribe's personal observations and the provider's statements to them. This document has been checked  and approved by the attending provider.    ------------------------------------------------  Blair Promise, PhD, MD

## 2015-03-24 ENCOUNTER — Ambulatory Visit
Admission: RE | Admit: 2015-03-24 | Discharge: 2015-03-24 | Disposition: A | Discharge status: Home / Self Care | Payer: Medicare Other | Source: Ambulatory Visit | Attending: Radiation Oncology | Admitting: Radiation Oncology

## 2015-03-24 DIAGNOSIS — Z510 Encounter for antineoplastic radiation therapy: Secondary | ICD-10-CM | POA: Diagnosis not present

## 2015-03-24 DIAGNOSIS — C52 Malignant neoplasm of vagina: Secondary | ICD-10-CM | POA: Diagnosis not present

## 2015-03-24 DIAGNOSIS — I129 Hypertensive chronic kidney disease with stage 1 through stage 4 chronic kidney disease, or unspecified chronic kidney disease: Secondary | ICD-10-CM | POA: Diagnosis not present

## 2015-03-24 DIAGNOSIS — C680 Malignant neoplasm of urethra: Secondary | ICD-10-CM | POA: Diagnosis not present

## 2015-03-24 DIAGNOSIS — C519 Malignant neoplasm of vulva, unspecified: Secondary | ICD-10-CM

## 2015-03-24 DIAGNOSIS — C511 Malignant neoplasm of labium minus: Secondary | ICD-10-CM | POA: Diagnosis not present

## 2015-03-27 ENCOUNTER — Ambulatory Visit (HOSPITAL_COMMUNITY)
Admission: RE | Admit: 2015-03-27 | Discharge: 2015-03-27 | Disposition: A | Discharge status: Home / Self Care | Payer: Medicare Other | Source: Ambulatory Visit | Attending: Gynecologic Oncology | Admitting: Gynecologic Oncology

## 2015-03-27 DIAGNOSIS — I2510 Atherosclerotic heart disease of native coronary artery without angina pectoris: Secondary | ICD-10-CM | POA: Insufficient documentation

## 2015-03-27 DIAGNOSIS — C519 Malignant neoplasm of vulva, unspecified: Secondary | ICD-10-CM | POA: Diagnosis not present

## 2015-03-27 DIAGNOSIS — N200 Calculus of kidney: Secondary | ICD-10-CM | POA: Diagnosis not present

## 2015-03-27 DIAGNOSIS — I517 Cardiomegaly: Secondary | ICD-10-CM | POA: Insufficient documentation

## 2015-03-27 DIAGNOSIS — N8320 Unspecified ovarian cysts: Secondary | ICD-10-CM | POA: Diagnosis not present

## 2015-03-27 DIAGNOSIS — I700 Atherosclerosis of aorta: Secondary | ICD-10-CM | POA: Insufficient documentation

## 2015-03-27 DIAGNOSIS — I77810 Thoracic aortic ectasia: Secondary | ICD-10-CM | POA: Insufficient documentation

## 2015-03-27 DIAGNOSIS — E279 Disorder of adrenal gland, unspecified: Secondary | ICD-10-CM | POA: Diagnosis not present

## 2015-03-27 DIAGNOSIS — C774 Secondary and unspecified malignant neoplasm of inguinal and lower limb lymph nodes: Secondary | ICD-10-CM | POA: Diagnosis not present

## 2015-03-27 LAB — GLUCOSE, CAPILLARY: GLUCOSE-CAPILLARY: 90 mg/dL (ref 65–99)

## 2015-03-27 MED ORDER — FLUDEOXYGLUCOSE F - 18 (FDG) INJECTION
5.1000 | Freq: Once | INTRAVENOUS | Status: DC | PRN
  Administered 2015-03-27: 5.1 via INTRAVENOUS
  Filled 2015-03-27: qty 5.1

## 2015-03-27 NOTE — Progress Notes (Signed)
  Radiation Oncology         [phone] ________________________________  Name: Meredith Bender MRN: [id number]  Date: 03/24/2015  DOB: 1925/09/28  SIMULATION AND TREATMENT PLANNING NOTE    ICD-9-CM ICD-10-CM   1. Primary vulvar cancer 184.4 C51.9     DIAGNOSIS:  Stage II vulvar cancer (anterior) involving distal urethra and vagina and anterior labia minora   NARRATIVE:  The patient was brought to the [facility].  Identity was confirmed.  All relevant records and images related to the planned course of therapy were reviewed.  The patient freely provided informed written consent to proceed with treatment after reviewing the details related to the planned course of therapy. The consent form was witnessed and verified by the simulation staff.  Then, the patient was set-up in a stable reproducible  supine position for radiation therapy.  CT images were obtained.  Surface markings were placed.  The CT images were loaded into the planning software.  Then the target and avoidance structures were contoured.  Treatment planning then occurred.  The radiation prescription was entered and confirmed.  Then, I designed and supervised the construction of a total of 1 medically necessary complex treatment devices.  I have requested : Intensity Modulated Radiotherapy (IMRT) is medically necessary for this case for the following reason:  Small bowel sparing and femoral head/neck sparing..  I have ordered:dose calc.  PLAN:  The patient will receive 60 Gy in 30 fractions.  ________________________________   -----------------------------------  Blair Promise, PhD, MD

## 2015-03-31 DIAGNOSIS — C511 Malignant neoplasm of labium minus: Secondary | ICD-10-CM | POA: Diagnosis not present

## 2015-03-31 DIAGNOSIS — Z510 Encounter for antineoplastic radiation therapy: Secondary | ICD-10-CM | POA: Diagnosis not present

## 2015-03-31 DIAGNOSIS — C680 Malignant neoplasm of urethra: Secondary | ICD-10-CM | POA: Diagnosis not present

## 2015-03-31 DIAGNOSIS — C519 Malignant neoplasm of vulva, unspecified: Secondary | ICD-10-CM | POA: Diagnosis not present

## 2015-03-31 DIAGNOSIS — C52 Malignant neoplasm of vagina: Secondary | ICD-10-CM | POA: Diagnosis not present

## 2015-03-31 DIAGNOSIS — I129 Hypertensive chronic kidney disease with stage 1 through stage 4 chronic kidney disease, or unspecified chronic kidney disease: Secondary | ICD-10-CM | POA: Diagnosis not present

## 2015-04-01 ENCOUNTER — Telehealth: Payer: Self-pay | Admitting: Gynecologic Oncology

## 2015-04-01 NOTE — Telephone Encounter (Signed)
Called and spoke with the patient's daughter about PET scan results.  Daughter stating that her mother is going back and forth about her decision for radiation.  Daughter asking about potential side effects from radiation.  She also states her mother has a new kitten and was scratched in the arm.  She has been treating the scratch and was wondering if treatment should be delayed for this to heal.  Advised that since the arm is not being radiating, treatment should not be post-poned for this and she should continue to treat the scratch.  Advised to call for any further questions or concerns.

## 2015-04-02 ENCOUNTER — Ambulatory Visit
Admission: RE | Admit: 2015-04-02 | Discharge: 2015-04-02 | Disposition: A | Discharge status: Home / Self Care | Payer: Medicare Other | Source: Ambulatory Visit | Attending: Radiation Oncology | Admitting: Radiation Oncology

## 2015-04-02 DIAGNOSIS — C519 Malignant neoplasm of vulva, unspecified: Secondary | ICD-10-CM | POA: Diagnosis not present

## 2015-04-02 DIAGNOSIS — C680 Malignant neoplasm of urethra: Secondary | ICD-10-CM | POA: Diagnosis not present

## 2015-04-02 DIAGNOSIS — Z510 Encounter for antineoplastic radiation therapy: Secondary | ICD-10-CM | POA: Diagnosis not present

## 2015-04-02 DIAGNOSIS — C511 Malignant neoplasm of labium minus: Secondary | ICD-10-CM | POA: Diagnosis not present

## 2015-04-02 DIAGNOSIS — I129 Hypertensive chronic kidney disease with stage 1 through stage 4 chronic kidney disease, or unspecified chronic kidney disease: Secondary | ICD-10-CM | POA: Diagnosis not present

## 2015-04-02 DIAGNOSIS — C52 Malignant neoplasm of vagina: Secondary | ICD-10-CM | POA: Diagnosis not present

## 2015-04-02 NOTE — Progress Notes (Signed)
  Radiation Oncology         [phone] ________________________________  Name: Meredith Bender MRN: [id number]  Date: 04/02/2015  DOB: 07-14-1926  Simulation Verification Note    ICD-9-CM ICD-10-CM   1. Primary vulvar cancer 184.4 C51.9     Status: outpatient  NARRATIVE: The patient was brought to the treatment unit and placed in the planned treatment position. The clinical setup was verified. Then port films were obtained and uploaded to the radiation oncology medical record software.  The treatment beams were carefully compared against the planned radiation fields. The position location and shape of the radiation fields was reviewed. They targeted volume of tissue appears to be appropriately covered by the radiation beams. Organs at risk appear to be excluded as planned.  Based on my personal review, I approved the simulation verification. The patient's treatment will proceed as planned.  -----------------------------------  Blair Promise, PhD, MD

## 2015-04-03 ENCOUNTER — Ambulatory Visit
Admission: RE | Admit: 2015-04-03 | Discharge: 2015-04-03 | Disposition: A | Discharge status: Home / Self Care | Payer: Medicare Other | Source: Ambulatory Visit | Attending: Radiation Oncology | Admitting: Radiation Oncology

## 2015-04-03 DIAGNOSIS — C680 Malignant neoplasm of urethra: Secondary | ICD-10-CM | POA: Diagnosis not present

## 2015-04-03 DIAGNOSIS — Z510 Encounter for antineoplastic radiation therapy: Secondary | ICD-10-CM | POA: Diagnosis not present

## 2015-04-03 DIAGNOSIS — C519 Malignant neoplasm of vulva, unspecified: Secondary | ICD-10-CM | POA: Diagnosis not present

## 2015-04-03 DIAGNOSIS — I129 Hypertensive chronic kidney disease with stage 1 through stage 4 chronic kidney disease, or unspecified chronic kidney disease: Secondary | ICD-10-CM | POA: Diagnosis not present

## 2015-04-03 DIAGNOSIS — C52 Malignant neoplasm of vagina: Secondary | ICD-10-CM | POA: Diagnosis not present

## 2015-04-03 DIAGNOSIS — C511 Malignant neoplasm of labium minus: Secondary | ICD-10-CM | POA: Diagnosis not present

## 2015-04-05 DIAGNOSIS — C519 Malignant neoplasm of vulva, unspecified: Secondary | ICD-10-CM | POA: Diagnosis not present

## 2015-04-06 ENCOUNTER — Ambulatory Visit
Admission: RE | Admit: 2015-04-06 | Discharge: 2015-04-06 | Disposition: A | Discharge status: Home / Self Care | Payer: Medicare Other | Source: Ambulatory Visit | Attending: Radiation Oncology | Admitting: Radiation Oncology

## 2015-04-06 DIAGNOSIS — C511 Malignant neoplasm of labium minus: Secondary | ICD-10-CM | POA: Diagnosis not present

## 2015-04-06 DIAGNOSIS — Z510 Encounter for antineoplastic radiation therapy: Secondary | ICD-10-CM | POA: Diagnosis not present

## 2015-04-06 DIAGNOSIS — I129 Hypertensive chronic kidney disease with stage 1 through stage 4 chronic kidney disease, or unspecified chronic kidney disease: Secondary | ICD-10-CM | POA: Diagnosis not present

## 2015-04-06 DIAGNOSIS — C519 Malignant neoplasm of vulva, unspecified: Secondary | ICD-10-CM | POA: Diagnosis not present

## 2015-04-06 DIAGNOSIS — C52 Malignant neoplasm of vagina: Secondary | ICD-10-CM | POA: Diagnosis not present

## 2015-04-06 DIAGNOSIS — C680 Malignant neoplasm of urethra: Secondary | ICD-10-CM | POA: Diagnosis not present

## 2015-04-07 ENCOUNTER — Encounter: Payer: Self-pay | Admitting: Radiation Oncology

## 2015-04-07 ENCOUNTER — Ambulatory Visit
Admission: RE | Admit: 2015-04-07 | Discharge: 2015-04-07 | Disposition: A | Discharge status: Home / Self Care | Payer: Medicare Other | Source: Ambulatory Visit | Attending: Radiation Oncology | Admitting: Radiation Oncology

## 2015-04-07 VITALS — BP 196/74 | HR 78 | Temp 97.6°F | Resp 20 | Ht 62.0 in | Wt 107.0 lb

## 2015-04-07 DIAGNOSIS — C519 Malignant neoplasm of vulva, unspecified: Secondary | ICD-10-CM | POA: Diagnosis not present

## 2015-04-07 DIAGNOSIS — C680 Malignant neoplasm of urethra: Secondary | ICD-10-CM | POA: Diagnosis not present

## 2015-04-07 DIAGNOSIS — C52 Malignant neoplasm of vagina: Secondary | ICD-10-CM | POA: Diagnosis not present

## 2015-04-07 DIAGNOSIS — Z510 Encounter for antineoplastic radiation therapy: Secondary | ICD-10-CM | POA: Diagnosis not present

## 2015-04-07 DIAGNOSIS — I129 Hypertensive chronic kidney disease with stage 1 through stage 4 chronic kidney disease, or unspecified chronic kidney disease: Secondary | ICD-10-CM | POA: Diagnosis not present

## 2015-04-07 DIAGNOSIS — C511 Malignant neoplasm of labium minus: Secondary | ICD-10-CM | POA: Diagnosis not present

## 2015-04-07 NOTE — Progress Notes (Signed)
Pt here for patient teaching.  Pt given Radiation and You booklet. Pt reports they have not watched the Radiation Therapy Education video . The link to the video has been given to the patient's daughter.  Reviewed areas of pertinence such as fatigue, hair loss, skin changes and urinary and bladder changes . Pt able to give teach back of to pat skin, use unscented/gentle soap and use baby wipes,avoid applying anything to skin within 4 hours of treatment. Pt demonstrated understanding, needs reinforcement and verbalizes understanding of information given and will contact nursing with any questions or concerns.

## 2015-04-07 NOTE — Progress Notes (Signed)
Meredith Bender has completed 4 fractions to her pelvis.  She denies having any pain.  She reports she is urinating frequently and dysuria that started before radiation.  She reports she has not had any vaginal bleeding for 2 days.    BP 196/74 mmHg  Pulse 78  Temp(Src) 97.6 F (36.4 C) (Oral)  Resp 20  Ht 5\' 2"  (1.575 m)  Wt 107 lb (48.535 kg)  BMI 19.57 kg/m2

## 2015-04-07 NOTE — Progress Notes (Signed)
  Radiation Oncology         [phone] ________________________________  Name: Meredith Bender MRN: [id number]  Date: 04/07/2015  DOB: [birth year]  Weekly Radiation Therapy Management  Primary vulvar cancer   Current Dose: 8 Gy     Planned Dose:  60 Gy  Narrative . . . . . . . . The patient presents for routine under treatment assessment.                                   The patient is without complaint. Bleeding has stopped                                 Set-up films were reviewed.                                 The chart was checked. Physical Findings. . .  height is 5\' 2"  (1.575 m) and weight is 107 lb (48.535 kg). Her oral temperature is 97.6 F (36.4 C). Her blood pressure is 196/74 and her pulse is 78. Her respiration is 20. . Weight essentially stable.  No significant changes. The lungs are clear. The heart has a regular rhythm and rate. Impression . . . . . . . The patient is tolerating radiation. Plan . . . . . . . . . . . . Continue treatment as planned.  ________________________________   Blair Promise, PhD, MD

## 2015-04-08 ENCOUNTER — Ambulatory Visit
Admission: RE | Admit: 2015-04-08 | Discharge: 2015-04-08 | Disposition: A | Discharge status: Home / Self Care | Payer: Medicare Other | Source: Ambulatory Visit | Attending: Radiation Oncology | Admitting: Radiation Oncology

## 2015-04-08 DIAGNOSIS — Z510 Encounter for antineoplastic radiation therapy: Secondary | ICD-10-CM | POA: Diagnosis not present

## 2015-04-08 DIAGNOSIS — C52 Malignant neoplasm of vagina: Secondary | ICD-10-CM | POA: Diagnosis not present

## 2015-04-08 DIAGNOSIS — C519 Malignant neoplasm of vulva, unspecified: Secondary | ICD-10-CM | POA: Diagnosis not present

## 2015-04-08 DIAGNOSIS — I129 Hypertensive chronic kidney disease with stage 1 through stage 4 chronic kidney disease, or unspecified chronic kidney disease: Secondary | ICD-10-CM | POA: Diagnosis not present

## 2015-04-08 DIAGNOSIS — C680 Malignant neoplasm of urethra: Secondary | ICD-10-CM | POA: Diagnosis not present

## 2015-04-08 DIAGNOSIS — C511 Malignant neoplasm of labium minus: Secondary | ICD-10-CM | POA: Diagnosis not present

## 2015-04-09 ENCOUNTER — Ambulatory Visit
Admission: RE | Admit: 2015-04-09 | Discharge: 2015-04-09 | Disposition: A | Discharge status: Home / Self Care | Payer: Medicare Other | Source: Ambulatory Visit | Attending: Radiation Oncology | Admitting: Radiation Oncology

## 2015-04-09 DIAGNOSIS — C519 Malignant neoplasm of vulva, unspecified: Secondary | ICD-10-CM | POA: Diagnosis not present

## 2015-04-09 DIAGNOSIS — C680 Malignant neoplasm of urethra: Secondary | ICD-10-CM | POA: Diagnosis not present

## 2015-04-09 DIAGNOSIS — C511 Malignant neoplasm of labium minus: Secondary | ICD-10-CM | POA: Diagnosis not present

## 2015-04-09 DIAGNOSIS — Z510 Encounter for antineoplastic radiation therapy: Secondary | ICD-10-CM | POA: Diagnosis not present

## 2015-04-09 DIAGNOSIS — I129 Hypertensive chronic kidney disease with stage 1 through stage 4 chronic kidney disease, or unspecified chronic kidney disease: Secondary | ICD-10-CM | POA: Diagnosis not present

## 2015-04-09 DIAGNOSIS — C52 Malignant neoplasm of vagina: Secondary | ICD-10-CM | POA: Diagnosis not present

## 2015-04-10 ENCOUNTER — Ambulatory Visit
Admission: RE | Admit: 2015-04-10 | Discharge: 2015-04-10 | Disposition: A | Discharge status: Home / Self Care | Payer: Medicare Other | Source: Ambulatory Visit | Attending: Radiation Oncology | Admitting: Radiation Oncology

## 2015-04-10 DIAGNOSIS — Z510 Encounter for antineoplastic radiation therapy: Secondary | ICD-10-CM | POA: Diagnosis not present

## 2015-04-10 DIAGNOSIS — C511 Malignant neoplasm of labium minus: Secondary | ICD-10-CM | POA: Diagnosis not present

## 2015-04-10 DIAGNOSIS — C680 Malignant neoplasm of urethra: Secondary | ICD-10-CM | POA: Diagnosis not present

## 2015-04-10 DIAGNOSIS — I129 Hypertensive chronic kidney disease with stage 1 through stage 4 chronic kidney disease, or unspecified chronic kidney disease: Secondary | ICD-10-CM | POA: Diagnosis not present

## 2015-04-10 DIAGNOSIS — C519 Malignant neoplasm of vulva, unspecified: Secondary | ICD-10-CM | POA: Diagnosis not present

## 2015-04-10 DIAGNOSIS — C52 Malignant neoplasm of vagina: Secondary | ICD-10-CM | POA: Diagnosis not present

## 2015-04-13 ENCOUNTER — Ambulatory Visit
Admission: RE | Admit: 2015-04-13 | Discharge: 2015-04-13 | Disposition: A | Discharge status: Home / Self Care | Payer: Medicare Other | Source: Ambulatory Visit | Attending: Radiation Oncology | Admitting: Radiation Oncology

## 2015-04-13 DIAGNOSIS — I129 Hypertensive chronic kidney disease with stage 1 through stage 4 chronic kidney disease, or unspecified chronic kidney disease: Secondary | ICD-10-CM | POA: Diagnosis not present

## 2015-04-13 DIAGNOSIS — C519 Malignant neoplasm of vulva, unspecified: Secondary | ICD-10-CM | POA: Diagnosis not present

## 2015-04-13 DIAGNOSIS — C511 Malignant neoplasm of labium minus: Secondary | ICD-10-CM | POA: Diagnosis not present

## 2015-04-13 DIAGNOSIS — C680 Malignant neoplasm of urethra: Secondary | ICD-10-CM | POA: Diagnosis not present

## 2015-04-13 DIAGNOSIS — Z510 Encounter for antineoplastic radiation therapy: Secondary | ICD-10-CM | POA: Diagnosis not present

## 2015-04-13 DIAGNOSIS — C52 Malignant neoplasm of vagina: Secondary | ICD-10-CM | POA: Diagnosis not present

## 2015-04-13 NOTE — Progress Notes (Signed)
Patient reports having more dysuria and urgency.  She is not able to give a urine sample at this time.  She has been given a sitz bath and has been instructed on how to use it.  She will see Dr. Sondra Come tomorrow.

## 2015-04-14 ENCOUNTER — Ambulatory Visit
Admission: RE | Admit: 2015-04-14 | Discharge: 2015-04-14 | Disposition: A | Discharge status: Home / Self Care | Payer: Medicare Other | Source: Ambulatory Visit | Attending: Radiation Oncology | Admitting: Radiation Oncology

## 2015-04-14 ENCOUNTER — Encounter: Payer: Self-pay | Admitting: Radiation Oncology

## 2015-04-14 VITALS — BP 192/79 | HR 72 | Temp 97.7°F | Ht 62.0 in | Wt 104.1 lb

## 2015-04-14 DIAGNOSIS — C519 Malignant neoplasm of vulva, unspecified: Secondary | ICD-10-CM | POA: Diagnosis not present

## 2015-04-14 DIAGNOSIS — I129 Hypertensive chronic kidney disease with stage 1 through stage 4 chronic kidney disease, or unspecified chronic kidney disease: Secondary | ICD-10-CM | POA: Diagnosis not present

## 2015-04-14 DIAGNOSIS — C52 Malignant neoplasm of vagina: Secondary | ICD-10-CM | POA: Diagnosis not present

## 2015-04-14 DIAGNOSIS — C511 Malignant neoplasm of labium minus: Secondary | ICD-10-CM | POA: Diagnosis not present

## 2015-04-14 DIAGNOSIS — C680 Malignant neoplasm of urethra: Secondary | ICD-10-CM | POA: Diagnosis not present

## 2015-04-14 DIAGNOSIS — Z510 Encounter for antineoplastic radiation therapy: Secondary | ICD-10-CM | POA: Diagnosis not present

## 2015-04-14 NOTE — Progress Notes (Signed)
Ms. Wehrli is here for her 9th fraction to her pelvis. She reports normal bowel movements, last one today. She does report mild burning, and some urgency and frequency with urination. She does report fatigue, but able to complete normal daily activities.  BP 192/79 mmHg  Pulse 72  Temp(Src) 97.7 F (36.5 C)  Ht 5\' 2"  (1.575 m)  Wt 104 lb 1.6 oz (47.219 kg)  BMI 19.04 kg/m2  Wt Readings from Last 3 Encounters:  04/14/15 104 lb 1.6 oz (47.219 kg)  04/07/15 107 lb (48.535 kg)  03/19/15 104 lb 4.8 oz (47.31 kg)

## 2015-04-14 NOTE — Progress Notes (Signed)
  Radiation Oncology         [phone] ________________________________  Name: Meredith Bender MRN: [id number]  Date: 04/14/2015  DOB: Jul 05, 1926  Weekly Radiation Therapy Management  Primary vulvar cancer   Current Dose: 18 Gy     Planned Dose:  60 Gy  Narrative . . . . . . . . The patient presents for routine under treatment assessment.                                   The patient is without complaint. Bleeding has stopped. She reports normal bowel movements, last one today. She does report mild burning, and some urgency and frequency with urination. She does report fatigue, but able to complete normal daily activities.                                  Set-up films were reviewed.                                 The chart was checked. Physical Findings. . .  height is 5\' 2"  (1.575 m) and weight is 104 lb 1.6 oz (47.219 kg). Her temperature is 97.7 F (36.5 C). Her blood pressure is 192/79 and her pulse is 72. . The lungs are clear. The heart has a regular rhythm and rate. The abdomen is soft and nontender with normal bowel sounds. Impression . . . . . . . The patient is tolerating radiation. Plan . . . . . . . . . . . . Continue treatment as planned. ________________________________   Blair Promise, PhD, MD

## 2015-04-15 ENCOUNTER — Ambulatory Visit
Admission: RE | Admit: 2015-04-15 | Discharge: 2015-04-15 | Disposition: A | Discharge status: Home / Self Care | Payer: Medicare Other | Source: Ambulatory Visit | Attending: Radiation Oncology | Admitting: Radiation Oncology

## 2015-04-15 DIAGNOSIS — Z510 Encounter for antineoplastic radiation therapy: Secondary | ICD-10-CM | POA: Diagnosis not present

## 2015-04-15 DIAGNOSIS — C519 Malignant neoplasm of vulva, unspecified: Secondary | ICD-10-CM | POA: Diagnosis not present

## 2015-04-15 DIAGNOSIS — I129 Hypertensive chronic kidney disease with stage 1 through stage 4 chronic kidney disease, or unspecified chronic kidney disease: Secondary | ICD-10-CM | POA: Diagnosis not present

## 2015-04-15 DIAGNOSIS — C511 Malignant neoplasm of labium minus: Secondary | ICD-10-CM | POA: Diagnosis not present

## 2015-04-15 DIAGNOSIS — C680 Malignant neoplasm of urethra: Secondary | ICD-10-CM | POA: Diagnosis not present

## 2015-04-15 DIAGNOSIS — C52 Malignant neoplasm of vagina: Secondary | ICD-10-CM | POA: Diagnosis not present

## 2015-04-16 ENCOUNTER — Ambulatory Visit
Admission: RE | Admit: 2015-04-16 | Discharge: 2015-04-16 | Disposition: A | Discharge status: Home / Self Care | Payer: Medicare Other | Source: Ambulatory Visit | Attending: Radiation Oncology | Admitting: Radiation Oncology

## 2015-04-16 DIAGNOSIS — C519 Malignant neoplasm of vulva, unspecified: Secondary | ICD-10-CM | POA: Diagnosis not present

## 2015-04-16 DIAGNOSIS — C680 Malignant neoplasm of urethra: Secondary | ICD-10-CM | POA: Diagnosis not present

## 2015-04-16 DIAGNOSIS — Z510 Encounter for antineoplastic radiation therapy: Secondary | ICD-10-CM | POA: Diagnosis not present

## 2015-04-16 DIAGNOSIS — C511 Malignant neoplasm of labium minus: Secondary | ICD-10-CM | POA: Diagnosis not present

## 2015-04-16 DIAGNOSIS — I129 Hypertensive chronic kidney disease with stage 1 through stage 4 chronic kidney disease, or unspecified chronic kidney disease: Secondary | ICD-10-CM | POA: Diagnosis not present

## 2015-04-16 DIAGNOSIS — C52 Malignant neoplasm of vagina: Secondary | ICD-10-CM | POA: Diagnosis not present

## 2015-04-17 ENCOUNTER — Ambulatory Visit
Admission: RE | Admit: 2015-04-17 | Discharge: 2015-04-17 | Disposition: A | Discharge status: Home / Self Care | Payer: Medicare Other | Source: Ambulatory Visit | Attending: Radiation Oncology | Admitting: Radiation Oncology

## 2015-04-17 DIAGNOSIS — C680 Malignant neoplasm of urethra: Secondary | ICD-10-CM | POA: Diagnosis not present

## 2015-04-17 DIAGNOSIS — I129 Hypertensive chronic kidney disease with stage 1 through stage 4 chronic kidney disease, or unspecified chronic kidney disease: Secondary | ICD-10-CM | POA: Diagnosis not present

## 2015-04-17 DIAGNOSIS — C52 Malignant neoplasm of vagina: Secondary | ICD-10-CM | POA: Diagnosis not present

## 2015-04-17 DIAGNOSIS — C519 Malignant neoplasm of vulva, unspecified: Secondary | ICD-10-CM | POA: Diagnosis not present

## 2015-04-17 DIAGNOSIS — C511 Malignant neoplasm of labium minus: Secondary | ICD-10-CM | POA: Diagnosis not present

## 2015-04-17 DIAGNOSIS — Z510 Encounter for antineoplastic radiation therapy: Secondary | ICD-10-CM | POA: Diagnosis not present

## 2015-04-20 ENCOUNTER — Encounter: Payer: Self-pay | Admitting: Radiation Oncology

## 2015-04-20 ENCOUNTER — Ambulatory Visit
Admission: RE | Admit: 2015-04-20 | Discharge: 2015-04-20 | Disposition: A | Discharge status: Home / Self Care | Payer: Medicare Other | Source: Ambulatory Visit | Attending: Radiation Oncology | Admitting: Radiation Oncology

## 2015-04-20 DIAGNOSIS — N189 Chronic kidney disease, unspecified: Secondary | ICD-10-CM | POA: Diagnosis not present

## 2015-04-20 DIAGNOSIS — C52 Malignant neoplasm of vagina: Secondary | ICD-10-CM | POA: Diagnosis not present

## 2015-04-20 DIAGNOSIS — C680 Malignant neoplasm of urethra: Secondary | ICD-10-CM | POA: Diagnosis not present

## 2015-04-20 DIAGNOSIS — Z8673 Personal history of transient ischemic attack (TIA), and cerebral infarction without residual deficits: Secondary | ICD-10-CM | POA: Diagnosis not present

## 2015-04-20 DIAGNOSIS — C511 Malignant neoplasm of labium minus: Secondary | ICD-10-CM | POA: Diagnosis not present

## 2015-04-20 DIAGNOSIS — J449 Chronic obstructive pulmonary disease, unspecified: Secondary | ICD-10-CM | POA: Diagnosis not present

## 2015-04-20 DIAGNOSIS — Z510 Encounter for antineoplastic radiation therapy: Secondary | ICD-10-CM | POA: Diagnosis not present

## 2015-04-20 DIAGNOSIS — C519 Malignant neoplasm of vulva, unspecified: Secondary | ICD-10-CM | POA: Diagnosis not present

## 2015-04-20 DIAGNOSIS — I129 Hypertensive chronic kidney disease with stage 1 through stage 4 chronic kidney disease, or unspecified chronic kidney disease: Secondary | ICD-10-CM | POA: Diagnosis not present

## 2015-04-21 ENCOUNTER — Ambulatory Visit
Admission: RE | Admit: 2015-04-21 | Discharge: 2015-04-21 | Disposition: A | Discharge status: Home / Self Care | Payer: Medicare Other | Source: Ambulatory Visit | Attending: Radiation Oncology | Admitting: Radiation Oncology

## 2015-04-21 ENCOUNTER — Encounter: Payer: Self-pay | Admitting: Radiation Oncology

## 2015-04-21 VITALS — BP 197/85 | HR 78 | Temp 97.5°F | Resp 16 | Wt 105.4 lb

## 2015-04-21 DIAGNOSIS — C680 Malignant neoplasm of urethra: Secondary | ICD-10-CM | POA: Diagnosis not present

## 2015-04-21 DIAGNOSIS — C511 Malignant neoplasm of labium minus: Secondary | ICD-10-CM | POA: Diagnosis not present

## 2015-04-21 DIAGNOSIS — Z510 Encounter for antineoplastic radiation therapy: Secondary | ICD-10-CM | POA: Diagnosis not present

## 2015-04-21 DIAGNOSIS — I129 Hypertensive chronic kidney disease with stage 1 through stage 4 chronic kidney disease, or unspecified chronic kidney disease: Secondary | ICD-10-CM | POA: Diagnosis not present

## 2015-04-21 DIAGNOSIS — C519 Malignant neoplasm of vulva, unspecified: Secondary | ICD-10-CM | POA: Diagnosis not present

## 2015-04-21 DIAGNOSIS — C52 Malignant neoplasm of vagina: Secondary | ICD-10-CM | POA: Diagnosis not present

## 2015-04-21 NOTE — Progress Notes (Signed)
Meredith Bender has completed 14 fractions to her pelvis.  She reports soreness and skin irritation in her vaginal and rectal area with urinating and bowel movements.  She has been sitting in the bathtub which helps.  She is going to start using her sitz bath.  Also recommended that she apply desitin as needed to her rectal area.  She denies having any diarrhea.  Her last bowel movement was this morning.  She denies having nausea.  She does report that her appetite is poor.  She denies having any vaginal/rectal bleeding although she does have yellow vaginal discharge.  She reports feeling fatigued.    BP 197/85 mmHg  Pulse 78  Temp(Src) 97.5 F (36.4 C) (Oral)  Resp 16  Wt 105 lb 6.4 oz (47.809 kg)  SpO2 100%   Wt Readings from Last 3 Encounters:  04/21/15 105 lb 6.4 oz (47.809 kg)  04/14/15 104 lb 1.6 oz (47.219 kg)  04/07/15 107 lb (48.535 kg)

## 2015-04-21 NOTE — Progress Notes (Signed)
  Radiation Oncology         [phone] ________________________________  Name: Meredith Bender MRN: [id number]  Date: 04/21/2015  DOB: 1925/09/09  Weekly Radiation Therapy Management  No diagnosis found.  Current Dose: 28 Gy     Planned Dose:  60 Gy  Narrative . . . . . . . . The patient presents for routine under treatment assessment.                                  The pt has completed 14 fractions to her pelvis. She reports soreness and skin irritation in her vaginal and rectal area with urination and bowel movements. She has been sitting in the bathtub which helps. She is going to start using her sitz bath. She denies having any diarrhea, nausea, vaginal/rectal bleeding. She reports yellow vaginal discharge and a poor appetite. She reports fatigue Her last bowel movement was this morning.                Set-up films were reviewed.                                 The chart was checked. Physical Findings. . .  weight is 105 lb 6.4 oz (47.809 kg). Her oral temperature is 97.5 F (36.4 C). Her blood pressure is 197/85 and her pulse is 78. Her respiration is 16 and oxygen saturation is 100%.  Alert and oriented x 3. Impression . . . . . . . The patient is tolerating radiation. Plan . . . . . . . . . . . . Continue treatment as planned. Discussed bowel management.   This document serves as a record of services personally performed by Thea Silversmith, MD. It was created on her behalf by Darcus Austin, a trained medical scribe. The creation of this record is based on the scribe's personal observations and the provider's statements to them. This document has been checked and approved by the attending provider.

## 2015-04-22 ENCOUNTER — Ambulatory Visit
Admission: RE | Admit: 2015-04-22 | Discharge: 2015-04-22 | Disposition: A | Discharge status: Home / Self Care | Payer: Medicare Other | Source: Ambulatory Visit | Attending: Radiation Oncology | Admitting: Radiation Oncology

## 2015-04-22 ENCOUNTER — Other Ambulatory Visit: Payer: Self-pay | Admitting: Family Medicine

## 2015-04-22 DIAGNOSIS — C52 Malignant neoplasm of vagina: Secondary | ICD-10-CM | POA: Diagnosis not present

## 2015-04-22 DIAGNOSIS — Z510 Encounter for antineoplastic radiation therapy: Secondary | ICD-10-CM | POA: Diagnosis not present

## 2015-04-22 DIAGNOSIS — C519 Malignant neoplasm of vulva, unspecified: Secondary | ICD-10-CM | POA: Diagnosis not present

## 2015-04-22 DIAGNOSIS — C511 Malignant neoplasm of labium minus: Secondary | ICD-10-CM | POA: Diagnosis not present

## 2015-04-22 DIAGNOSIS — I129 Hypertensive chronic kidney disease with stage 1 through stage 4 chronic kidney disease, or unspecified chronic kidney disease: Secondary | ICD-10-CM | POA: Diagnosis not present

## 2015-04-22 DIAGNOSIS — C680 Malignant neoplasm of urethra: Secondary | ICD-10-CM | POA: Diagnosis not present

## 2015-04-23 ENCOUNTER — Ambulatory Visit
Admission: RE | Admit: 2015-04-23 | Discharge: 2015-04-23 | Disposition: A | Discharge status: Home / Self Care | Payer: Medicare Other | Source: Ambulatory Visit | Attending: Radiation Oncology | Admitting: Radiation Oncology

## 2015-04-23 DIAGNOSIS — C519 Malignant neoplasm of vulva, unspecified: Secondary | ICD-10-CM | POA: Diagnosis not present

## 2015-04-23 DIAGNOSIS — C52 Malignant neoplasm of vagina: Secondary | ICD-10-CM | POA: Diagnosis not present

## 2015-04-23 DIAGNOSIS — C680 Malignant neoplasm of urethra: Secondary | ICD-10-CM | POA: Diagnosis not present

## 2015-04-23 DIAGNOSIS — I129 Hypertensive chronic kidney disease with stage 1 through stage 4 chronic kidney disease, or unspecified chronic kidney disease: Secondary | ICD-10-CM | POA: Diagnosis not present

## 2015-04-23 DIAGNOSIS — C511 Malignant neoplasm of labium minus: Secondary | ICD-10-CM | POA: Diagnosis not present

## 2015-04-23 DIAGNOSIS — Z510 Encounter for antineoplastic radiation therapy: Secondary | ICD-10-CM | POA: Diagnosis not present

## 2015-04-24 ENCOUNTER — Telehealth: Payer: Self-pay | Admitting: Family Medicine

## 2015-04-24 ENCOUNTER — Ambulatory Visit
Admission: RE | Admit: 2015-04-24 | Discharge: 2015-04-24 | Disposition: A | Discharge status: Home / Self Care | Payer: Medicare Other | Source: Ambulatory Visit | Attending: Radiation Oncology | Admitting: Radiation Oncology

## 2015-04-24 DIAGNOSIS — I129 Hypertensive chronic kidney disease with stage 1 through stage 4 chronic kidney disease, or unspecified chronic kidney disease: Secondary | ICD-10-CM | POA: Diagnosis not present

## 2015-04-24 DIAGNOSIS — C519 Malignant neoplasm of vulva, unspecified: Secondary | ICD-10-CM | POA: Diagnosis not present

## 2015-04-24 DIAGNOSIS — C511 Malignant neoplasm of labium minus: Secondary | ICD-10-CM | POA: Diagnosis not present

## 2015-04-24 DIAGNOSIS — C52 Malignant neoplasm of vagina: Secondary | ICD-10-CM | POA: Diagnosis not present

## 2015-04-24 DIAGNOSIS — Z23 Encounter for immunization: Secondary | ICD-10-CM | POA: Diagnosis not present

## 2015-04-24 DIAGNOSIS — C680 Malignant neoplasm of urethra: Secondary | ICD-10-CM | POA: Diagnosis not present

## 2015-04-24 DIAGNOSIS — Z510 Encounter for antineoplastic radiation therapy: Secondary | ICD-10-CM | POA: Diagnosis not present

## 2015-04-24 MED ORDER — HYDRALAZINE HCL 25 MG PO TABS: 25.0000 mg | ORAL_TABLET | Freq: Three times a day (TID) | ORAL | Status: DC

## 2015-04-24 NOTE — Telephone Encounter (Signed)
Medication resent to patient's pharmacy.  Martin, Tamika L, RN  

## 2015-04-24 NOTE — Telephone Encounter (Signed)
Daughter is calling because the pharmacy is stating that they did not get her refill we sent yesterday for Hydralazine. jw

## 2015-04-27 ENCOUNTER — Ambulatory Visit
Admission: RE | Admit: 2015-04-27 | Discharge: 2015-04-27 | Disposition: A | Discharge status: Home / Self Care | Payer: Medicare Other | Source: Ambulatory Visit | Attending: Radiation Oncology | Admitting: Radiation Oncology

## 2015-04-27 DIAGNOSIS — C511 Malignant neoplasm of labium minus: Secondary | ICD-10-CM | POA: Diagnosis not present

## 2015-04-27 DIAGNOSIS — C680 Malignant neoplasm of urethra: Secondary | ICD-10-CM | POA: Diagnosis not present

## 2015-04-27 DIAGNOSIS — I129 Hypertensive chronic kidney disease with stage 1 through stage 4 chronic kidney disease, or unspecified chronic kidney disease: Secondary | ICD-10-CM | POA: Diagnosis not present

## 2015-04-27 DIAGNOSIS — Z510 Encounter for antineoplastic radiation therapy: Secondary | ICD-10-CM | POA: Diagnosis not present

## 2015-04-27 DIAGNOSIS — C52 Malignant neoplasm of vagina: Secondary | ICD-10-CM | POA: Diagnosis not present

## 2015-04-27 DIAGNOSIS — C519 Malignant neoplasm of vulva, unspecified: Secondary | ICD-10-CM | POA: Diagnosis not present

## 2015-04-28 ENCOUNTER — Ambulatory Visit
Admission: RE | Admit: 2015-04-28 | Discharge: 2015-04-28 | Disposition: A | Discharge status: Home / Self Care | Payer: Medicare Other | Source: Ambulatory Visit | Attending: Radiation Oncology | Admitting: Radiation Oncology

## 2015-04-28 ENCOUNTER — Encounter: Payer: Self-pay | Admitting: Radiation Oncology

## 2015-04-28 VITALS — BP 189/83 | HR 77 | Temp 97.6°F | Resp 16 | Ht 62.0 in | Wt 106.1 lb

## 2015-04-28 DIAGNOSIS — I129 Hypertensive chronic kidney disease with stage 1 through stage 4 chronic kidney disease, or unspecified chronic kidney disease: Secondary | ICD-10-CM | POA: Diagnosis not present

## 2015-04-28 DIAGNOSIS — Z510 Encounter for antineoplastic radiation therapy: Secondary | ICD-10-CM | POA: Diagnosis not present

## 2015-04-28 DIAGNOSIS — C52 Malignant neoplasm of vagina: Secondary | ICD-10-CM | POA: Diagnosis not present

## 2015-04-28 DIAGNOSIS — C511 Malignant neoplasm of labium minus: Secondary | ICD-10-CM | POA: Diagnosis not present

## 2015-04-28 DIAGNOSIS — C680 Malignant neoplasm of urethra: Secondary | ICD-10-CM | POA: Diagnosis not present

## 2015-04-28 DIAGNOSIS — C519 Malignant neoplasm of vulva, unspecified: Secondary | ICD-10-CM | POA: Diagnosis not present

## 2015-04-28 MED ORDER — SILVER SULFADIAZINE 1 % EX CREA
TOPICAL_CREAM | Freq: Two times a day (BID) | CUTANEOUS | Status: DC
  Administered 2015-04-28: 17:00:00 via TOPICAL

## 2015-04-28 NOTE — Addendum Note (Signed)
Encounter addended by: Jacqulyn Liner, RN on: 04/28/2015  5:25 PM<BR>     Documentation filed: Inpatient MAR

## 2015-04-28 NOTE — Addendum Note (Signed)
Encounter addended by: Jacqulyn Liner, RN on: 04/28/2015  5:22 PM<BR>     Documentation filed: [employer], Orders

## 2015-04-28 NOTE — Progress Notes (Signed)
  Radiation Oncology         (336) [phone] ________________________________  Name: Meredith Bender MRN: [id number]  Date: 04/28/2015  DOB: 1926-03-13  Weekly Radiation Therapy Management    ICD-9-CM ICD-10-CM   1. Primary vulvar cancer (HCC) 184.4 C51.9     Current Dose: 38 Gy     Planned Dose:  60 Gy  Narrative . . . . . . . . The patient presents for routine under treatment assessment.                                   Meredith Bender has completed 19 fractions to her pelvis. She reports having burning with urination due to skin irritation. She reports that she tried to the sitz bath but it did not help. She denise having vaginal bleeding or hematuria. She denies having diarrhea and reports her last bm was this morning. She denies having nausea but reports having a poor appetite. She reports her energy level is low.                                  Set-up films were reviewed.                                 The chart was checked. Physical Findings. . .  height is 5\' 2"  (1.575 m) and weight is 106 lb 1.6 oz (48.127 kg). Her oral temperature is 97.6 F (36.4 C). Her blood pressure is 189/83 and her pulse is 77. Her respiration is 16. . Significant swelling noted in the perineum/vulvar and erythema. The left inguinal lymph node has decreased in size based on palpation measurements   Impression . . . . . . . The patient is tolerating radiation. Plan . . . . . . . . . . . . Continue treatment as planned. Patient is been given Silvadene and lidocaine. She is considering stopping early and we'll reassess in approximately 1 week concerning this issue.  ________________________________   Blair Promise, PhD, MD

## 2015-04-28 NOTE — Progress Notes (Signed)
Meredith Bender has completed 19 fractions to her pelvis.  She reports having burning with urination due to skin irritation.  She reports that she tried to the sitz bath but it did not help.  She denise having vaginal bleeding or hematuria.  She denies having diarrhea and reports her last bm was this morning.  She denies having nausea but reports having a poor appetite.  She reports her energy level is low.  BP 189/83 mmHg  Pulse 77  Temp(Src) 97.6 F (36.4 C) (Oral)  Resp 16  Ht 5\' 2"  (1.575 m)  Wt 106 lb 1.6 oz (48.127 kg)  BMI 19.40 kg/m2   Wt Readings from Last 3 Encounters:  04/28/15 106 lb 1.6 oz (48.127 kg)  04/21/15 105 lb 6.4 oz (47.809 kg)  04/14/15 104 lb 1.6 oz (47.219 kg)

## 2015-04-29 ENCOUNTER — Ambulatory Visit
Admission: RE | Admit: 2015-04-29 | Discharge: 2015-04-29 | Disposition: A | Discharge status: Home / Self Care | Payer: Medicare Other | Source: Ambulatory Visit | Attending: Radiation Oncology | Admitting: Radiation Oncology

## 2015-04-29 DIAGNOSIS — I129 Hypertensive chronic kidney disease with stage 1 through stage 4 chronic kidney disease, or unspecified chronic kidney disease: Secondary | ICD-10-CM | POA: Diagnosis not present

## 2015-04-29 DIAGNOSIS — C52 Malignant neoplasm of vagina: Secondary | ICD-10-CM | POA: Diagnosis not present

## 2015-04-29 DIAGNOSIS — C511 Malignant neoplasm of labium minus: Secondary | ICD-10-CM | POA: Diagnosis not present

## 2015-04-29 DIAGNOSIS — C519 Malignant neoplasm of vulva, unspecified: Secondary | ICD-10-CM | POA: Diagnosis not present

## 2015-04-29 DIAGNOSIS — C680 Malignant neoplasm of urethra: Secondary | ICD-10-CM | POA: Diagnosis not present

## 2015-04-29 DIAGNOSIS — Z510 Encounter for antineoplastic radiation therapy: Secondary | ICD-10-CM | POA: Diagnosis not present

## 2015-04-29 NOTE — Progress Notes (Signed)
Patient given sample of lidocaine 2% jelly per Dr. Sondra Come.  Instructed her to apply a small amount to her vaginal area to help with dysuria.  Patient verbalized agreement and understanding.

## 2015-04-30 ENCOUNTER — Ambulatory Visit
Admission: RE | Admit: 2015-04-30 | Discharge: 2015-04-30 | Disposition: A | Discharge status: Home / Self Care | Payer: Medicare Other | Source: Ambulatory Visit | Attending: Radiation Oncology | Admitting: Radiation Oncology

## 2015-04-30 DIAGNOSIS — C52 Malignant neoplasm of vagina: Secondary | ICD-10-CM | POA: Diagnosis not present

## 2015-04-30 DIAGNOSIS — Z510 Encounter for antineoplastic radiation therapy: Secondary | ICD-10-CM | POA: Diagnosis not present

## 2015-04-30 DIAGNOSIS — C680 Malignant neoplasm of urethra: Secondary | ICD-10-CM | POA: Diagnosis not present

## 2015-04-30 DIAGNOSIS — C511 Malignant neoplasm of labium minus: Secondary | ICD-10-CM | POA: Diagnosis not present

## 2015-04-30 DIAGNOSIS — C519 Malignant neoplasm of vulva, unspecified: Secondary | ICD-10-CM | POA: Diagnosis not present

## 2015-04-30 DIAGNOSIS — I129 Hypertensive chronic kidney disease with stage 1 through stage 4 chronic kidney disease, or unspecified chronic kidney disease: Secondary | ICD-10-CM | POA: Diagnosis not present

## 2015-05-01 ENCOUNTER — Ambulatory Visit
Admission: RE | Admit: 2015-05-01 | Discharge: 2015-05-01 | Disposition: A | Discharge status: Home / Self Care | Payer: Medicare Other | Source: Ambulatory Visit | Attending: Radiation Oncology | Admitting: Radiation Oncology

## 2015-05-01 DIAGNOSIS — C511 Malignant neoplasm of labium minus: Secondary | ICD-10-CM | POA: Diagnosis not present

## 2015-05-01 DIAGNOSIS — Z510 Encounter for antineoplastic radiation therapy: Secondary | ICD-10-CM | POA: Diagnosis not present

## 2015-05-01 DIAGNOSIS — C680 Malignant neoplasm of urethra: Secondary | ICD-10-CM | POA: Diagnosis not present

## 2015-05-01 DIAGNOSIS — I129 Hypertensive chronic kidney disease with stage 1 through stage 4 chronic kidney disease, or unspecified chronic kidney disease: Secondary | ICD-10-CM | POA: Diagnosis not present

## 2015-05-01 DIAGNOSIS — C52 Malignant neoplasm of vagina: Secondary | ICD-10-CM | POA: Diagnosis not present

## 2015-05-01 DIAGNOSIS — C519 Malignant neoplasm of vulva, unspecified: Secondary | ICD-10-CM | POA: Diagnosis not present

## 2015-05-04 ENCOUNTER — Telehealth: Payer: Self-pay | Admitting: Oncology

## 2015-05-04 ENCOUNTER — Ambulatory Visit: Payer: Medicare Other

## 2015-05-04 ENCOUNTER — Ambulatory Visit
Admission: RE | Admit: 2015-05-04 | Discharge: 2015-05-04 | Disposition: A | Discharge status: Home / Self Care | Payer: Medicare Other | Source: Ambulatory Visit | Attending: Radiation Oncology | Admitting: Radiation Oncology

## 2015-05-04 DIAGNOSIS — C519 Malignant neoplasm of vulva, unspecified: Secondary | ICD-10-CM | POA: Diagnosis not present

## 2015-05-04 NOTE — Telephone Encounter (Signed)
Was notified that Meredith Bender canceled treatment today.  Called [name] and she said she had a rough weekend.  She was not able to sleep due to the skin irritation.  She is using silvadene and tried the lidocaine jelly.  She said the lidocaine worked for a short time and then the pain came back.  She would like to cancel today.  Asked if she would like to come in before her treatment time to see Dr. Sondra Come tomorrow.  She said that she would.  Also called her daughter, [name], to make sure she can be here at 4:00 tomorrow.

## 2015-05-05 ENCOUNTER — Ambulatory Visit
Admission: RE | Admit: 2015-05-05 | Discharge: 2015-05-05 | Disposition: A | Discharge status: Home / Self Care | Payer: Medicare Other | Source: Ambulatory Visit | Attending: Radiation Oncology | Admitting: Radiation Oncology

## 2015-05-05 ENCOUNTER — Encounter: Payer: Self-pay | Admitting: Radiation Oncology

## 2015-05-05 VITALS — BP 208/91 | HR 79 | Temp 97.9°F | Resp 18 | Ht 62.0 in | Wt 103.3 lb

## 2015-05-05 DIAGNOSIS — C519 Malignant neoplasm of vulva, unspecified: Secondary | ICD-10-CM

## 2015-05-05 DIAGNOSIS — R300 Dysuria: Secondary | ICD-10-CM

## 2015-05-05 DIAGNOSIS — C511 Malignant neoplasm of labium minus: Secondary | ICD-10-CM | POA: Diagnosis not present

## 2015-05-05 DIAGNOSIS — C680 Malignant neoplasm of urethra: Secondary | ICD-10-CM | POA: Diagnosis not present

## 2015-05-05 DIAGNOSIS — I129 Hypertensive chronic kidney disease with stage 1 through stage 4 chronic kidney disease, or unspecified chronic kidney disease: Secondary | ICD-10-CM | POA: Diagnosis not present

## 2015-05-05 DIAGNOSIS — Z510 Encounter for antineoplastic radiation therapy: Secondary | ICD-10-CM | POA: Diagnosis not present

## 2015-05-05 DIAGNOSIS — C52 Malignant neoplasm of vagina: Secondary | ICD-10-CM | POA: Diagnosis not present

## 2015-05-05 LAB — URINALYSIS, ROUTINE W REFLEX MICROSCOPIC
Bilirubin Urine: NEGATIVE
Glucose, UA: NEGATIVE mg/dL
KETONES UR: NEGATIVE mg/dL
NITRITE: NEGATIVE
Specific Gravity, Urine: 1.02 (ref 1.005–1.030)
Urobilinogen, UA: 1 mg/dL (ref 0.0–1.0)
pH: 6.5 (ref 5.0–8.0)

## 2015-05-05 LAB — URINE MICROSCOPIC-ADD ON

## 2015-05-05 NOTE — Progress Notes (Addendum)
Meredith Bender has completed 22 fractions to her pelvis.  She reports not feeling well today.  She reports pain at a 10/10 with urination due to skin irritation.  She reports she is urinating more often and reports getting up about every 30 minutes during the night to urinate.  She also reports having burning pain with bowel movements.  She is using silvadene once a day and one tramadol tablet per day.  She reports the lidocaine jelly helped for short time periods.  She reports having nausea and vomited once last night.  She reports not having an appetite and has lost 3 lbs since last week.  Orthostatic vitals done: bp sitting 208/91, hr 79  , bp standing 175/91, hr 81.  She denies having any vaginal/rectal bleeding.    BP 208/91 mmHg  Pulse 79  Temp(Src) 97.9 F (36.6 C) (Oral)  Resp 18  Ht 5\' 2"  (1.575 m)  Wt 103 lb 4.8 oz (46.857 kg)  BMI 18.89 kg/m2  SpO2 98%   Wt Readings from Last 3 Encounters:  05/05/15 103 lb 4.8 oz (46.857 kg)  04/28/15 106 lb 1.6 oz (48.127 kg)  04/21/15 105 lb 6.4 oz (47.809 kg)

## 2015-05-05 NOTE — Progress Notes (Signed)
  Radiation Oncology         [phone] ________________________________  Name: Meredith Bender MRN: [id number]  Date: 05/05/2015  DOB: March 17, 1926  Weekly Radiation Therapy Management    ICD-9-CM ICD-10-CM   1. Primary vulvar cancer (HCC) 184.4 C51.9      Current Dose: 46 Gy     Planned Dose:  60 Gy  Narrative . . . . . . . . The patient presents for routine under treatment assessment.                                   The patient is having significant discomfort in the perineum. She denies any bleeding. She has urinary frequency but no hematuria. She has significant nocturia approximately every half hour. The patient will then a urine specimen today to rule out UTI.                                 Set-up films were reviewed.                                 The chart was checked. Physical Findings. . .  height is 5\' 2"  (1.575 m) and weight is 103 lb 4.8 oz (46.857 kg). Her oral temperature is 97.9 F (36.6 C). Her blood pressure is 208/91 and her pulse is 79. Her respiration is 18 and oxygen saturation is 98%. . The oral cavity is moist without secondary infection. The lungs are clear. The heart has regular rhythm and rate. The abdomen is soft and nontender with normal bowel sounds. Examination of the vulvar area reveals significant shrinkage of the vulvar mass. Patient does have moist desquamation in the perineum without obvious infection. The labia are swollen Impression . . . . . . . The patient is tolerating radiation. Plan . . . . . . . . . . . . Continue treatment as planned. Patient will be treated today and likely Wednesday and Thursday then off for a long weekend in light of her symptoms.  ________________________________   Blair Promise, PhD, MD

## 2015-05-06 ENCOUNTER — Ambulatory Visit
Admission: RE | Admit: 2015-05-06 | Discharge: 2015-05-06 | Disposition: A | Discharge status: Home / Self Care | Payer: Medicare Other | Source: Ambulatory Visit | Attending: Radiation Oncology | Admitting: Radiation Oncology

## 2015-05-06 DIAGNOSIS — C511 Malignant neoplasm of labium minus: Secondary | ICD-10-CM | POA: Diagnosis not present

## 2015-05-06 DIAGNOSIS — I129 Hypertensive chronic kidney disease with stage 1 through stage 4 chronic kidney disease, or unspecified chronic kidney disease: Secondary | ICD-10-CM | POA: Diagnosis not present

## 2015-05-06 DIAGNOSIS — C52 Malignant neoplasm of vagina: Secondary | ICD-10-CM | POA: Diagnosis not present

## 2015-05-06 DIAGNOSIS — C680 Malignant neoplasm of urethra: Secondary | ICD-10-CM | POA: Diagnosis not present

## 2015-05-06 DIAGNOSIS — Z510 Encounter for antineoplastic radiation therapy: Secondary | ICD-10-CM | POA: Diagnosis not present

## 2015-05-06 DIAGNOSIS — C519 Malignant neoplasm of vulva, unspecified: Secondary | ICD-10-CM | POA: Diagnosis not present

## 2015-05-07 ENCOUNTER — Ambulatory Visit: Admission: RE | Admit: 2015-05-07 | Payer: Medicare Other | Source: Ambulatory Visit

## 2015-05-07 ENCOUNTER — Ambulatory Visit
Admission: RE | Admit: 2015-05-07 | Discharge: 2015-05-07 | Disposition: A | Discharge status: Home / Self Care | Payer: Medicare Other | Source: Ambulatory Visit | Attending: Radiation Oncology | Admitting: Radiation Oncology

## 2015-05-07 VITALS — BP 196/83 | HR 75 | Temp 98.0°F | Ht 62.0 in | Wt 104.1 lb

## 2015-05-07 DIAGNOSIS — C519 Malignant neoplasm of vulva, unspecified: Secondary | ICD-10-CM | POA: Diagnosis not present

## 2015-05-07 DIAGNOSIS — I129 Hypertensive chronic kidney disease with stage 1 through stage 4 chronic kidney disease, or unspecified chronic kidney disease: Secondary | ICD-10-CM | POA: Diagnosis not present

## 2015-05-07 DIAGNOSIS — C511 Malignant neoplasm of labium minus: Secondary | ICD-10-CM | POA: Diagnosis not present

## 2015-05-07 DIAGNOSIS — C52 Malignant neoplasm of vagina: Secondary | ICD-10-CM | POA: Diagnosis not present

## 2015-05-07 DIAGNOSIS — Z510 Encounter for antineoplastic radiation therapy: Secondary | ICD-10-CM | POA: Diagnosis not present

## 2015-05-07 DIAGNOSIS — C680 Malignant neoplasm of urethra: Secondary | ICD-10-CM | POA: Diagnosis not present

## 2015-05-07 NOTE — Progress Notes (Signed)
Ms. Furgeson is here after receiving her 25th fraction of radiation to her pelvis. She has a current bladder infection and is here to see Dr. Sondra Come. She reports fatigue and soreness in her vaginal and rectal area which she is not using any products for this at this time. She has lidocaine, silvadene cream at home, and I have encouraged her to use this as needed.   BP 196/83 mmHg  Pulse 75  Temp(Src) 98 F (36.7 C)  Ht 5\' 2"  (1.575 m)  Wt 104 lb 1.6 oz (47.219 kg)  BMI 19.04 kg/m2   Wt Readings from Last 3 Encounters:  05/07/15 104 lb 1.6 oz (47.219 kg)  05/05/15 103 lb 4.8 oz (46.857 kg)  04/28/15 106 lb 1.6 oz (48.127 kg)

## 2015-05-07 NOTE — Progress Notes (Signed)
  Radiation Oncology         [phone] ________________________________  Name: Meredith Bender MRN: [id number]  Date: 05/07/2015  DOB: 05-23-1926  Weekly Radiation Therapy Management    ICD-9-CM ICD-10-CM   1. Primary vulvar cancer (HCC) 184.4 C51.9      Current Dose: 50 Gy     Planned Dose:  60 Gy  Narrative . . . . . . . . The patient presents for routine under treatment assessment.                                   She has a current bladder infection. She reports fatigue and soreness in her vaginal and rectal area. She is not using any products for this at this time. She has lidocaine and silvadene cream at home. She has been encouraged by the nurse to use these as needed. She also has burning with urination.                                 Set-up films were reviewed.                                 The chart was checked. Physical Findings. . .  height is 5\' 2"  (1.575 m) and weight is 104 lb 1.6 oz (47.219 kg). Her temperature is 98 F (36.7 C). Her blood pressure is 196/83 and her pulse is 75.    Impression . . . . . . . The patient is tolerating radiation. However, she has some fatigue and would like a break at this time. Plan . . . . . . . . . . . . Patient will be off tomorrow and next week. She will be started on antibiotics tomorrow once the cultures are back. Preliminary results showed an E. coli infection. Restart radiation November 7.  This document serves as a record of services personally performed by Gery Pray, MD. It was created on his behalf by Darcus Austin, a trained medical scribe. The creation of this record is based on the scribe's personal observations and the provider's statements to them. This document has been checked and approved by the attending provider.  ________________________________   Blair Promise, PhD, MD

## 2015-05-08 ENCOUNTER — Other Ambulatory Visit: Payer: Self-pay | Admitting: Radiation Oncology

## 2015-05-08 ENCOUNTER — Ambulatory Visit: Payer: Medicare Other

## 2015-05-08 ENCOUNTER — Telehealth: Payer: Self-pay | Admitting: Oncology

## 2015-05-08 DIAGNOSIS — N390 Urinary tract infection, site not specified: Secondary | ICD-10-CM

## 2015-05-08 LAB — URINE CULTURE

## 2015-05-08 MED ORDER — CEPHALEXIN 250 MG PO CAPS: 250.0000 mg | ORAL_CAPSULE | Freq: Every day | ORAL | Status: DC

## 2015-05-08 NOTE — Telephone Encounter (Signed)
Called Rickie to see if Guiliana was able to take Keflex without any issues in the past.  Rickie called her and Amorita said she was able to take Keflex.  Called Dr. Sondra Come and let him know.

## 2015-05-11 ENCOUNTER — Ambulatory Visit: Payer: Medicare Other

## 2015-05-12 ENCOUNTER — Telehealth: Payer: Self-pay | Admitting: Oncology

## 2015-05-12 ENCOUNTER — Ambulatory Visit: Payer: Medicare Other

## 2015-05-12 NOTE — Telephone Encounter (Addendum)
Meredith Bender called and wanted to make sure that Meredith Bender's antibiotic was only for 7 days.  Advised her that was correct.  Also confirmed with her that Meredith Bender will restart radiation on 05/25/15.  Meredith Bender said she was out of town until 11/2 so they would like to restart on 05/25/15.

## 2015-05-13 ENCOUNTER — Ambulatory Visit: Payer: Medicare Other

## 2015-05-14 ENCOUNTER — Ambulatory Visit: Payer: Medicare Other

## 2015-05-15 ENCOUNTER — Ambulatory Visit: Payer: Medicare Other

## 2015-05-18 ENCOUNTER — Ambulatory Visit: Payer: Medicare Other

## 2015-05-19 ENCOUNTER — Ambulatory Visit: Payer: Medicare Other

## 2015-05-20 ENCOUNTER — Ambulatory Visit: Payer: Medicare Other

## 2015-05-21 ENCOUNTER — Ambulatory Visit: Payer: Medicare Other

## 2015-05-22 ENCOUNTER — Ambulatory Visit: Payer: Medicare Other

## 2015-05-25 ENCOUNTER — Ambulatory Visit
Admission: RE | Admit: 2015-05-25 | Discharge: 2015-05-25 | Disposition: A | Discharge status: Home / Self Care | Payer: Medicare Other | Source: Ambulatory Visit | Attending: Radiation Oncology | Admitting: Radiation Oncology

## 2015-05-25 DIAGNOSIS — C519 Malignant neoplasm of vulva, unspecified: Secondary | ICD-10-CM | POA: Diagnosis not present

## 2015-05-25 DIAGNOSIS — C511 Malignant neoplasm of labium minus: Secondary | ICD-10-CM | POA: Diagnosis not present

## 2015-05-25 DIAGNOSIS — Z510 Encounter for antineoplastic radiation therapy: Secondary | ICD-10-CM | POA: Diagnosis not present

## 2015-05-25 DIAGNOSIS — C52 Malignant neoplasm of vagina: Secondary | ICD-10-CM | POA: Diagnosis not present

## 2015-05-25 DIAGNOSIS — C680 Malignant neoplasm of urethra: Secondary | ICD-10-CM | POA: Diagnosis not present

## 2015-05-25 DIAGNOSIS — I129 Hypertensive chronic kidney disease with stage 1 through stage 4 chronic kidney disease, or unspecified chronic kidney disease: Secondary | ICD-10-CM | POA: Diagnosis not present

## 2015-05-26 ENCOUNTER — Ambulatory Visit
Admission: RE | Admit: 2015-05-26 | Discharge: 2015-05-26 | Disposition: A | Discharge status: Home / Self Care | Payer: Medicare Other | Source: Ambulatory Visit | Attending: Radiation Oncology | Admitting: Radiation Oncology

## 2015-05-26 ENCOUNTER — Encounter: Payer: Self-pay | Admitting: Radiation Oncology

## 2015-05-26 VITALS — BP 173/71 | HR 82 | Temp 98.0°F | Ht 62.0 in | Wt 103.1 lb

## 2015-05-26 DIAGNOSIS — C680 Malignant neoplasm of urethra: Secondary | ICD-10-CM | POA: Diagnosis not present

## 2015-05-26 DIAGNOSIS — I129 Hypertensive chronic kidney disease with stage 1 through stage 4 chronic kidney disease, or unspecified chronic kidney disease: Secondary | ICD-10-CM | POA: Diagnosis not present

## 2015-05-26 DIAGNOSIS — C511 Malignant neoplasm of labium minus: Secondary | ICD-10-CM | POA: Diagnosis not present

## 2015-05-26 DIAGNOSIS — C519 Malignant neoplasm of vulva, unspecified: Secondary | ICD-10-CM

## 2015-05-26 DIAGNOSIS — C52 Malignant neoplasm of vagina: Secondary | ICD-10-CM | POA: Diagnosis not present

## 2015-05-26 DIAGNOSIS — Z510 Encounter for antineoplastic radiation therapy: Secondary | ICD-10-CM | POA: Diagnosis not present

## 2015-05-26 NOTE — Progress Notes (Signed)
Meredith Bender is here for her 27/30 radiation treatments to her Pelvis. She reports some fatigue, and naps during the day. She reports urinating frequently at night and only voids a small amount each time. She also states she has urgency during the daytime. She is having normal bowel movements. She reports some burning in her rectal area when she has bowel movements, and I have encouraged her to use the silvadene cream she was given.   BP 173/71 mmHg  Pulse 82  Temp(Src) 98 F (36.7 C)  Ht 5\' 2"  (1.575 m)  Wt 103 lb 1.6 oz (46.766 kg)  BMI 18.85 kg/m2   Wt Readings from Last 3 Encounters:  05/26/15 103 lb 1.6 oz (46.766 kg)  05/07/15 104 lb 1.6 oz (47.219 kg)  05/05/15 103 lb 4.8 oz (46.857 kg)

## 2015-05-26 NOTE — Progress Notes (Signed)
  Radiation Oncology         (336) [phone] ________________________________  Name: Meredith Bender MRN: [id number]  Date: 05/26/2015  DOB: 12-Dec-1925  Weekly Radiation Therapy Management    ICD-9-CM ICD-10-CM   1. Primary vulvar cancer (HCC) 184.4 C51.9     Current Dose: 54 Gy     Planned Dose:  60 Gy  Narrative . . . . . . . . The patient presents for routine under treatment assessment.                                   The patient is doing better since her break in treatment. She continues to have some urinary frequency particularly at night. She denies any bleeding from the vaginal or perineum area.                                 Set-up films were reviewed.                                 The chart was checked. Physical Findings. . .  height is 5\' 2"  (1.575 m) and weight is 103 lb 1.6 oz (46.766 kg). Her temperature is 98 F (36.7 C). Her blood pressure is 173/71 and her pulse is 82. .Lungs are clear to auscultation bilaterally. Heart has regular rate and rhythm. No palpable cervical, supraclavicular, or axillary adenopathy. Impression . . . . . . . The patient is tolerating radiation. Plan . . . . . . . . . . . . Continue treatment as planned.  ________________________________   Blair Promise, PhD, MD

## 2015-05-27 ENCOUNTER — Ambulatory Visit
Admission: RE | Admit: 2015-05-27 | Discharge: 2015-05-27 | Disposition: A | Discharge status: Home / Self Care | Payer: Medicare Other | Source: Ambulatory Visit | Attending: Radiation Oncology | Admitting: Radiation Oncology

## 2015-05-27 DIAGNOSIS — I129 Hypertensive chronic kidney disease with stage 1 through stage 4 chronic kidney disease, or unspecified chronic kidney disease: Secondary | ICD-10-CM | POA: Diagnosis not present

## 2015-05-27 DIAGNOSIS — Z510 Encounter for antineoplastic radiation therapy: Secondary | ICD-10-CM | POA: Diagnosis not present

## 2015-05-27 DIAGNOSIS — C519 Malignant neoplasm of vulva, unspecified: Secondary | ICD-10-CM | POA: Diagnosis not present

## 2015-05-27 DIAGNOSIS — C511 Malignant neoplasm of labium minus: Secondary | ICD-10-CM | POA: Diagnosis not present

## 2015-05-27 DIAGNOSIS — C52 Malignant neoplasm of vagina: Secondary | ICD-10-CM | POA: Diagnosis not present

## 2015-05-27 DIAGNOSIS — C680 Malignant neoplasm of urethra: Secondary | ICD-10-CM | POA: Diagnosis not present

## 2015-05-28 ENCOUNTER — Ambulatory Visit
Admission: RE | Admit: 2015-05-28 | Discharge: 2015-05-28 | Disposition: A | Discharge status: Home / Self Care | Payer: Medicare Other | Source: Ambulatory Visit | Attending: Radiation Oncology | Admitting: Radiation Oncology

## 2015-05-28 DIAGNOSIS — C511 Malignant neoplasm of labium minus: Secondary | ICD-10-CM | POA: Diagnosis not present

## 2015-05-28 DIAGNOSIS — C519 Malignant neoplasm of vulva, unspecified: Secondary | ICD-10-CM | POA: Diagnosis not present

## 2015-05-28 DIAGNOSIS — C52 Malignant neoplasm of vagina: Secondary | ICD-10-CM | POA: Diagnosis not present

## 2015-05-28 DIAGNOSIS — C680 Malignant neoplasm of urethra: Secondary | ICD-10-CM | POA: Diagnosis not present

## 2015-05-28 DIAGNOSIS — I129 Hypertensive chronic kidney disease with stage 1 through stage 4 chronic kidney disease, or unspecified chronic kidney disease: Secondary | ICD-10-CM | POA: Diagnosis not present

## 2015-05-28 DIAGNOSIS — Z510 Encounter for antineoplastic radiation therapy: Secondary | ICD-10-CM | POA: Diagnosis not present

## 2015-05-29 ENCOUNTER — Ambulatory Visit
Admission: RE | Admit: 2015-05-29 | Discharge: 2015-05-29 | Disposition: A | Discharge status: Home / Self Care | Payer: Medicare Other | Source: Ambulatory Visit | Attending: Radiation Oncology | Admitting: Radiation Oncology

## 2015-05-29 ENCOUNTER — Encounter: Payer: Self-pay | Admitting: Radiation Oncology

## 2015-05-29 DIAGNOSIS — C519 Malignant neoplasm of vulva, unspecified: Secondary | ICD-10-CM | POA: Diagnosis not present

## 2015-05-29 DIAGNOSIS — C680 Malignant neoplasm of urethra: Secondary | ICD-10-CM | POA: Diagnosis not present

## 2015-05-29 DIAGNOSIS — C511 Malignant neoplasm of labium minus: Secondary | ICD-10-CM | POA: Diagnosis not present

## 2015-05-29 DIAGNOSIS — I129 Hypertensive chronic kidney disease with stage 1 through stage 4 chronic kidney disease, or unspecified chronic kidney disease: Secondary | ICD-10-CM | POA: Diagnosis not present

## 2015-05-29 DIAGNOSIS — Z510 Encounter for antineoplastic radiation therapy: Secondary | ICD-10-CM | POA: Diagnosis not present

## 2015-05-29 DIAGNOSIS — C52 Malignant neoplasm of vagina: Secondary | ICD-10-CM | POA: Diagnosis not present

## 2015-06-03 ENCOUNTER — Telehealth: Payer: Self-pay | Admitting: *Deleted

## 2015-06-03 ENCOUNTER — Ambulatory Visit: Payer: Medicare Other

## 2015-06-03 NOTE — Telephone Encounter (Signed)
Pt was originally scheduled for "toradol shot in knees" on nurse visit today.  Called and explained to pt that would need to be an MD visit (Dr.Neal is not in the building today), she is agreeable.  Dr. Verlon Au 1st available appt was NOV 5.  I have scheduled pt for then but she was hoping to have them before thanksgiving.  She is requesting that I send a message to Dr. Nori Riis to see if there was any possibility of getting her in sooner?  Will forward to Dr. Nori Riis and call pt back. Adyline Huberty, Salome Spotted, CMA

## 2015-06-04 ENCOUNTER — Ambulatory Visit (INDEPENDENT_AMBULATORY_CARE_PROVIDER_SITE_OTHER): Payer: Medicare Other | Admitting: Family Medicine

## 2015-06-04 VITALS — BP 190/76 | HR 83 | Temp 97.9°F | Wt 101.0 lb

## 2015-06-04 DIAGNOSIS — M25561 Pain in right knee: Secondary | ICD-10-CM

## 2015-06-04 DIAGNOSIS — M25562 Pain in left knee: Secondary | ICD-10-CM

## 2015-06-04 NOTE — Telephone Encounter (Signed)
Meredith Bender I can put her pn [facility] schedule today, OR we can work her in at [facility] on Friday AM---I would prefer today if possible as she has never been to [facility] and may get lost. If that won't work, I will finda time early next week to get it done Let me know THANKS! [name]

## 2015-06-04 NOTE — Telephone Encounter (Signed)
Pt made appt for this am. Meredith Bender, Meredith Bender

## 2015-06-08 ENCOUNTER — Encounter: Payer: Self-pay | Admitting: Gynecologic Oncology

## 2015-06-08 NOTE — Progress Notes (Signed)
Gynecologic Oncology Multi-Disciplinary Disposition Conference Note  Date of the Conference: April 13, 2015  Patient Name: Meredith Bender  Referring Provider: Dr. [name] Primary GYN Oncologist:  Dr. [name]  Stage/Disposition:  Disposition is to palliative radiation with no surgical resection including lymphadenectomy due to family request.  Recommend palliative radiation to inguinal region and to vulva.   This Multidisciplinary conference took place involving physicians from [place], [place], Radiation Oncology, Pathology, Radiology along with the Gynecologic Oncology Nurse Practitioner and RN.  Comprehensive assessment of the patient's malignancy, staging, need for surgery, chemotherapy, radiation therapy, and need for further testing were reviewed. Supportive measures, both inpatient and following discharge were also discussed. The recommended plan of care is documented. Greater than 35 minutes were spent correlating and coordinating this patient's care.

## 2015-06-09 NOTE — Progress Notes (Signed)
  Radiation Oncology         [phone] ________________________________  Name: BERLIN JERUE MRN: [id number]  Date: 05/29/2015  DOB: 09/01/1925  End of Treatment Note    ICD-9-CM ICD-10-CM   1. 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   She did develop a lot of soreness in the treatment area and urinary symptoms requiring a break in her treatment. Patient's tumor mass responded well to her therapy and at the completion of therapy no further problems with bleeding.  Plan: The patient has completed radiation treatment. The patient will return to radiation oncology clinic for routine followup in one month. I advised them to call or return sooner if they have any questions or concerns related to their recovery or treatment.  -----------------------------------  Blair Promise, PhD, MD

## 2015-06-15 ENCOUNTER — Telehealth: Payer: Self-pay | Admitting: Oncology

## 2015-06-15 NOTE — Telephone Encounter (Signed)
Rickie called and said Meredith Bender is having urinary urgency and frequency.  She said she is not having any dysuria but reports the frequency is disruptive to her quality of life.  Advised Rickie that Dr. Sondra Come will be notified and that we will call her in the morning to see if she needs to come in for follow up.

## 2015-06-16 ENCOUNTER — Telehealth: Payer: Self-pay | Admitting: Oncology

## 2015-06-16 NOTE — Telephone Encounter (Signed)
Called Meredith Bender and advised her that Dr. Sondra Come would like to wait until Thursday to see if the urinary symptoms improve.  If not, then [name] should come in for a urinalysis and culture.  Meredith Bender verbalized agreement and understanding.

## 2015-06-16 NOTE — Progress Notes (Signed)
Patient ID: Meredith Bender, female   DOB: Dec 18, 1925, 81 y.o.   MRN: [id number] INJECTION: Patient was given informed consent, signed copy in the chart. Appropriate time out was taken. Area prepped and draped in usual sterile fashion. 1 cc of methylprednisolone 40 mg/ml plus  4 cc of 1% lidocaine without epinephrine was injected into the bilateral knees using a(n) anterior medial approach. The patient tolerated the procedure well. There were no complications. Post procedure instructions were given. \

## 2015-06-17 ENCOUNTER — Ambulatory Visit: Payer: Medicare Other | Admitting: Family Medicine

## 2015-06-18 ENCOUNTER — Other Ambulatory Visit: Payer: Self-pay | Admitting: Oncology

## 2015-06-18 ENCOUNTER — Telehealth: Payer: Self-pay | Admitting: Oncology

## 2015-06-18 DIAGNOSIS — R350 Frequency of micturition: Secondary | ICD-10-CM

## 2015-06-18 NOTE — Telephone Encounter (Signed)
Rickie called and said Meredith Bender is still urinating frequently and went 13 times yesterday.  Aliceson denies having any dysuria or hematuria.  Asked if she could come in for a urinalysis/culture today.  Rickie said she would be able to bring in a sample tomorrow.

## 2015-06-19 ENCOUNTER — Ambulatory Visit
Admission: RE | Admit: 2015-06-19 | Discharge: 2015-06-19 | Disposition: A | Discharge status: Home / Self Care | Payer: Medicare Other | Source: Ambulatory Visit | Attending: Radiation Oncology | Admitting: Radiation Oncology

## 2015-06-19 DIAGNOSIS — R350 Frequency of micturition: Secondary | ICD-10-CM | POA: Insufficient documentation

## 2015-06-19 LAB — URINALYSIS, MICROSCOPIC - CHCC
Bilirubin (Urine): NEGATIVE
Glucose: NEGATIVE mg/dL
Ketones: NEGATIVE mg/dL
NITRITE: NEGATIVE
Protein: 100 mg/dL
Specific Gravity, Urine: 1.015 (ref 1.003–1.035)
UROBILINOGEN UR: 0.2 mg/dL (ref 0.2–1)
pH: 6.5 (ref 4.6–8.0)

## 2015-06-21 LAB — URINE CULTURE

## 2015-06-22 ENCOUNTER — Telehealth: Payer: Self-pay | Admitting: Oncology

## 2015-06-22 MED ORDER — CIPROFLOXACIN HCL 250 MG PO TABS: 250.0000 mg | ORAL_TABLET | Freq: Every day | ORAL | Status: DC

## 2015-06-22 NOTE — Telephone Encounter (Signed)
[place] and spoke to [place], Pharmacist regarding antibiotic choice for UTI.  She reviewed the urine culture C&S and recommended using Ciprofloxacin 250 mg once a day for 7 days due to Lenni's last CrCl of 14.  Notified Dr. Sondra Come.  Called Rickie and advised her that Alexismarie has a UTI and that a prescription for Ciprofloxacin has been sent electronically to [facility].  Advised her to call if Jaiyana's symptoms are not improved by next Monday.  Rickie verbalized agreement and understanding.

## 2015-07-01 ENCOUNTER — Encounter: Payer: Self-pay | Admitting: Oncology

## 2015-07-02 ENCOUNTER — Ambulatory Visit
Admission: RE | Admit: 2015-07-02 | Discharge: 2015-07-02 | Disposition: A | Discharge status: Home / Self Care | Payer: Medicare Other | Source: Ambulatory Visit | Attending: Radiation Oncology | Admitting: Radiation Oncology

## 2015-07-02 ENCOUNTER — Encounter: Payer: Self-pay | Admitting: Radiation Oncology

## 2015-07-02 VITALS — BP 188/70 | HR 81 | Temp 97.2°F | Resp 18 | Ht 62.0 in | Wt 102.7 lb

## 2015-07-02 DIAGNOSIS — C519 Malignant neoplasm of vulva, unspecified: Secondary | ICD-10-CM

## 2015-07-02 NOTE — Progress Notes (Signed)
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 04/02/2015-05/29/2015.  Site/dose:   Vulvar region, involved inguinal lymph node - 60 Gy in 30 Fractions  Narrative:  The patient returns today for routine follow-up.   She denies pain. She reports she is not urinating as frequently. She is getting up 2-3 times per night to urinate. She reports she thinks her bladder has prolapsed. She denies dysuria and vaginal/rectal bleeding. She reports having yellow vaginal discharge which she had before radiation. She denies having any bowel issues and her last bowel movement was this morning. She reports having a good appetite and her energy level is improving. She denies having any skin irritation.  ALLERGIES:  is allergic to codeine and penicillins.  Meds: Current Outpatient Prescriptions  Medication Sig Dispense Refill  . amLODipine (NORVASC) 5 MG tablet TK 1 T PO  HS  11  . Cholecalciferol (D3-1000) 1000 UNITS capsule Take 2,000 Units by mouth daily.    . hydrALAZINE (APRESOLINE) 25 MG tablet Take 1 tablet (25 mg total) by mouth 3 (three) times daily. 270 tablet 0  . loratadine (CLARITIN) 10 MG tablet Take 1 tablet (10 mg total) by mouth daily. 90 tablet 3  . omeprazole (PRILOSEC) 20 MG capsule Take 20 mg by mouth daily.    . traMADol (ULTRAM) 50 MG tablet STake by mouth oneor two tablets every 8 hours max of 6 tabs in 24 hours 180 tablet 5  . verapamil (VERELAN PM) 240 MG 24 hr capsule Take one by mouth daily 90 capsule 3  . vitamin E (VITAMIN E) 400 UNIT capsule Take 400 Units by mouth daily.      . ciprofloxacin (CIPRO) 250 MG tablet Take 1 tablet (250 mg total) by mouth daily. (Patient  not taking: Reported on 07/02/2015) 7 tablet 0  . Probiotic Product (PROBIOTIC DAILY) CAPS florastar 250 mh take one by mouth daily (Patient not taking: Reported on 07/02/2015) 100 capsule 3  . silver sulfADIAZINE (SILVADENE) 1 % cream Apply 1 application topically daily. Reported on 07/02/2015     No current facility-administered medications for this encounter.    Physical Findings: The patient is in no acute distress. Patient is alert and oriented.  height is 5\' 2"  (1.575 m) and weight is 102 lb 11.2 oz (46.584 kg). Her oral temperature is 97.2 F (36.2 C). Her blood pressure is 188/70 and her pulse is 81. Her respiration is 18 and oxygen saturation is 99%. .  No significant changes.  Vulvar lesion has regressed significantly. She still has inflammation in the distal vagina. When standing up the patient is noted to have rectal prolapse. Daughter informs me that the patient has had prior surgery for rectal prolapse many years ago by Dr. Lucia Gaskins  Lab Findings: Lab Results  Component Value Date   WBC 9.3 02/25/2015   HGB 13.1 02/25/2015   HCT 38.7 02/25/2015   MCV 86.2 02/25/2015   PLT 286 02/25/2015    Radiographic Findings: No results found.  Impression:  The patient is having a favorable response to radiation with significant tumor shrinkage. The patient has had some urinary symptoms recently and was found to have a bladder  infection. This was treated with Cipro. Her urinary symptoms are already better with antibiotic therapy.  Plan: She will be scheduled for a follow up with Dr. Denman George in two months. She will follow up with me in a month. We'll consider vaginal dilator therapy at that time  ____________________________________ -----------------------------------  Blair Promise, PhD, MD    This document serves as a record of services personally performed by Gery Pray, MD. It was created on his behalf by Lendon Collar, a trained medical scribe. The creation of this record  is based on the scribe's personal observations and the provider's statements to them. This document has been checked and approved by the attending provider.

## 2015-07-02 NOTE — Progress Notes (Signed)
Meredith Bender here for follow up.  She denies pain.  She reports she is not urinating as frequently.  She is getting up 2-3 times per night to urinate.  She reports she thinks her bladder has prolapsed.  She denies dysuria and vaginal/rectal bleeding.  She reports having yellow vaginal discharge which she had before radiation.  She denies having any bowel issues and her last bowel movement was this morning.  She reports having a good appetite and her energy level is improving.  She denies having any skin irritation.  BP 188/70 mmHg  Pulse 81  Temp(Src) 97.2 F (36.2 C) (Oral)  Resp 18  Ht 5\' 2"  (1.575 m)  Wt 102 lb 11.2 oz (46.584 kg)  BMI 18.78 kg/m2  SpO2 99%

## 2015-07-15 ENCOUNTER — Other Ambulatory Visit: Payer: Self-pay | Admitting: Family Medicine

## 2015-07-15 ENCOUNTER — Encounter: Payer: Self-pay | Admitting: Family Medicine

## 2015-07-15 ENCOUNTER — Ambulatory Visit (INDEPENDENT_AMBULATORY_CARE_PROVIDER_SITE_OTHER): Payer: Medicare Other | Admitting: Family Medicine

## 2015-07-15 VITALS — BP 152/71 | HR 80 | Temp 97.7°F | Ht 62.0 in | Wt 100.7 lb

## 2015-07-15 DIAGNOSIS — L858 Other specified epidermal thickening: Secondary | ICD-10-CM | POA: Diagnosis present

## 2015-07-15 MED ORDER — MUPIROCIN 2 % EX OINT: 2.0000 "application " | TOPICAL_OINTMENT | Freq: Two times a day (BID) | CUTANEOUS | Status: DC

## 2015-07-15 NOTE — Patient Instructions (Signed)
Thank you for coming in,   Please follow up with me in one week if there is no improvement.   Please follow up with me in two weeks if you would like to consider having this taken off.    Sign up for My Chart to have easy access to your labs results, and communication with your Primary care physician   Please feel free to call with any questions or concerns at any time, at [phone]. --Dr. Raeford Razor

## 2015-07-17 DIAGNOSIS — L858 Other specified epidermal thickening: Secondary | ICD-10-CM | POA: Insufficient documentation

## 2015-07-17 NOTE — Assessment & Plan Note (Signed)
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  - If lesion is still there can consider for removal and dermatology clinic versus referral - Discussed with Dr. Erin Hearing

## 2015-07-17 NOTE — Progress Notes (Signed)
   Subjective:    Patient ID: Meredith Bender, female    DOB: 05-10-1926, 81 y.o.   MRN: [id number]  Seen for Same day visit for   CC: bump on her hand   Started occurring about 2 weeks ago. Reports that she was able to squeeze pus out of it one time. Occurring on the dorsal aspect of her left hand Her daughter feels that the redness is getting worse Denies any fevers, chills, or night sweats She has just finished chemotherapy in November She denies any prior history of similar lesions There is only pain if it is touched   Review of Systems   See HPI for ROS. Objective:  BP 152/71 mmHg  Pulse 80  Temp(Src) 97.7 F (36.5 C) (Oral)  Ht 5\' 2"  (1.575 m)  Wt 100 lb 11.2 oz (45.677 kg)  BMI 18.41 kg/m2  General: NAD, elderly frail female Extremities:  WWP. 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  - If lesion is still there can consider for removal and dermatology clinic versus referral - Discussed with Dr. Erin Hearing

## 2015-07-23 ENCOUNTER — Other Ambulatory Visit: Payer: Self-pay | Admitting: Family Medicine

## 2015-08-25 ENCOUNTER — Other Ambulatory Visit: Payer: Self-pay | Admitting: Family Medicine

## 2015-08-26 ENCOUNTER — Ambulatory Visit (INDEPENDENT_AMBULATORY_CARE_PROVIDER_SITE_OTHER): Payer: Medicare Other | Admitting: Family Medicine

## 2015-08-26 ENCOUNTER — Encounter: Payer: Self-pay | Admitting: Family Medicine

## 2015-08-26 VITALS — BP 162/76 | HR 80 | Temp 97.9°F | Ht 62.0 in | Wt 99.2 lb

## 2015-08-26 DIAGNOSIS — M25561 Pain in right knee: Secondary | ICD-10-CM

## 2015-08-26 DIAGNOSIS — M25562 Pain in left knee: Secondary | ICD-10-CM | POA: Diagnosis not present

## 2015-08-26 MED ORDER — TRAMADOL HCL 50 MG PO TABS: ORAL_TABLET | ORAL | Status: DC

## 2015-08-26 NOTE — Progress Notes (Signed)
   Subjective:    Patient ID: Meredith Bender, female    DOB: 1926/05/06, 82 y.o.   MRN: [id number]  HPI Enlarging lesion on the back of her left hand. This also bother her with some pain and itchiness. She is here with her daughter and would like to discuss options to get rid of this.  #2. She needs refill on her tramadol. She says that's working quite well for her pain. Her daughter says she seems to be tolerating the medicine without any problems of sedation or dizziness.  #3: In general, she's been doing well since completing her radiation therapy for vaginal cancer. Her appetite is not as good and her daughter complains that she's not eating as well.  SH: Her daughter wants her to move back in with her and sounds like that is probably a happen in the next few weeks or so.   Review of Systems She's had no fever. The lesion on her hand has not been bleeding.    Objective:   Physical Exam Vital signs reviewed GEN.: Frail elderly female no acute distress SKIN: Her on the dorsum of the left hand is a nodular 5-6 mm in diameter lesion with a second lesion abutting at that is about 3 mm in diameter. The larger lesion has a small depression in the center. The base of the large lesion is slightly erythematous but there is no surrounding erythema. There is no increased warmth. She has full range of motion of all her fingers in her wrist that is painless.       Assessment & Plan:  Skin lesion uncertain behavior. This could be a keratoacanthoma although could also be a nodular basal cell. We discussed options for treatment in detail. She decided for excision. I can work her into my clinic tomorrow so we'll see her then and excise this and send it off.

## 2015-08-26 NOTE — Assessment & Plan Note (Signed)
She's done extremely well and tramadol so refill that today. I again discussed pre cautions with her and her daughter

## 2015-08-27 ENCOUNTER — Ambulatory Visit (INDEPENDENT_AMBULATORY_CARE_PROVIDER_SITE_OTHER): Payer: Medicare Other | Admitting: Family Medicine

## 2015-08-27 VITALS — BP 188/62 | HR 86 | Temp 97.5°F | Ht 62.0 in | Wt 99.1 lb

## 2015-08-27 DIAGNOSIS — C44629 Squamous cell carcinoma of skin of left upper limb, including shoulder: Secondary | ICD-10-CM | POA: Diagnosis not present

## 2015-08-27 DIAGNOSIS — D485 Neoplasm of uncertain behavior of skin: Secondary | ICD-10-CM

## 2015-08-27 NOTE — Progress Notes (Signed)
Patient ID: Meredith Bender, female   DOB: Dec 03, 1925, 82 y.o.   MRN: [id number] She is here left hand..  Patient presents with lesion on dorsum of LEFT hand (patient is Right  hand dominant).. She is accompanied by her daughter, Meredith Bender.  PROCEDURE NOTE: EXCISION LEFT hand lesion (dorsum) Patient given informed consent, signed copy in the chart. Appropriate time out taken. Area prepped and draped in usual sterile fashion  3 Cc of  1%  lidocaine  With epinephrine   was used to obtain local anesthesia. After anesthesia was obtained, a surgical marking pen used to outline area of excision. A scalpel was used to outline an elipse incision Around the nodular lesion.  Pickups were then use to stabilize the lesion  The surrounding skin.  There was minimal bleeding  The area underneath the excised lesion looks normal with no residual abnormal-type tissue. Closure of skin obtained with  4 interrupted type  4-0 nylon sutures .Marland Kitchen  Antibiotic ointment and bandaid applied.   Final incision length 2.1 cm. Total amount of bleeding was less than 3 cc. . The excised tissue was  Sent for pathology..    There were  no   complications.   Patient tolerated procedure well.  Patient was given post procedure instructions and a detailed discussion  of concerning signs and symptoms to watch for such as fever, redness, unusual swelling, significant pain, drainage.  Patient should return for followup  :7 days for suture removal OR we gave ger daughter (who is a Marine scientist) a suture removal kit and she may remove them. If she has any questions, I gave her my pager number as well as my email. Pathology pending

## 2015-08-27 NOTE — Patient Instructions (Signed)
[email or web address] Pager [phone] Stitches out in about 7 days. Potentially could come out in 5 if they are getting tight

## 2015-08-31 ENCOUNTER — Telehealth: Payer: Self-pay | Admitting: Family Medicine

## 2015-08-31 NOTE — Telephone Encounter (Signed)
[phone] Meredith Bender (M) Notified her of results--"skin cancer" is whatI told her to be succinct. We do not need to do anything else--will watch teh area for recurrence.

## 2015-11-09 ENCOUNTER — Ambulatory Visit: Payer: Medicare Other | Attending: Gynecologic Oncology | Admitting: Gynecologic Oncology

## 2015-11-09 ENCOUNTER — Encounter: Payer: Self-pay | Admitting: Gynecologic Oncology

## 2015-11-09 VITALS — BP 191/84 | HR 74 | Temp 97.8°F | Resp 18 | Ht 62.0 in | Wt 104.6 lb

## 2015-11-09 DIAGNOSIS — I10 Essential (primary) hypertension: Secondary | ICD-10-CM | POA: Insufficient documentation

## 2015-11-09 DIAGNOSIS — Z8673 Personal history of transient ischemic attack (TIA), and cerebral infarction without residual deficits: Secondary | ICD-10-CM | POA: Diagnosis not present

## 2015-11-09 DIAGNOSIS — J449 Chronic obstructive pulmonary disease, unspecified: Secondary | ICD-10-CM | POA: Insufficient documentation

## 2015-11-09 DIAGNOSIS — C519 Malignant neoplasm of vulva, unspecified: Secondary | ICD-10-CM

## 2015-11-09 DIAGNOSIS — E785 Hyperlipidemia, unspecified: Secondary | ICD-10-CM | POA: Diagnosis not present

## 2015-11-09 DIAGNOSIS — M1990 Unspecified osteoarthritis, unspecified site: Secondary | ICD-10-CM | POA: Diagnosis not present

## 2015-11-09 DIAGNOSIS — Z8544 Personal history of malignant neoplasm of other female genital organs: Secondary | ICD-10-CM | POA: Insufficient documentation

## 2015-11-09 DIAGNOSIS — N189 Chronic kidney disease, unspecified: Secondary | ICD-10-CM | POA: Insufficient documentation

## 2015-11-09 DIAGNOSIS — I129 Hypertensive chronic kidney disease with stage 1 through stage 4 chronic kidney disease, or unspecified chronic kidney disease: Secondary | ICD-10-CM | POA: Diagnosis not present

## 2015-11-09 DIAGNOSIS — K623 Rectal prolapse: Secondary | ICD-10-CM | POA: Diagnosis not present

## 2015-11-09 DIAGNOSIS — Z880 Allergy status to penicillin: Secondary | ICD-10-CM | POA: Insufficient documentation

## 2015-11-09 NOTE — Progress Notes (Signed)
Follow-up Note: Gyn-Onc  Consult was initially requested by Dr. Nori Riis for the evaluation of Meredith Bender 82 y.o. female  CC:  Chief Complaint  Patient presents with  . Vulvar Cancer    Follow up visit    Assessment/Plan:  Meredith Bender  is a 82 y.o.  year old with a history of clinical stage II vulvar cancer (anterior) involving distal urethra and vagina and anterior labia minora (Rt>Lt) and right inguinal lymph nodes. S/p palliative radiation to right groin and vulva. Complete clinical response.  Will continue close follow-up with treatment goals being palliation of symptoms. Will not order imaging or aggressive workup as interventions such as chemotherapy are not desired by patient and her family.  I will see her again in 4-6 months.  HPI: Meredith Bender is a very pleasant 82 year old G5P5 who is seen in consultation at the request of Dr [name] for a urethral/vaginal mass and left ovarian cysts. The patient reports a 1 year (or more) history of bloody vaginal discharge. It has been stable in nature. Approximately 2 years ago, she was admitted to the hospital with a fractured hip and developed c.diff. Imaging was obtained at that time which revealed a urethral lesion (no exam was done). The patient and her family elected to not further workup the findings.  She presented to her doctor, Dr Nori Riis, in July, 2016 for symptoms of persistent vaginal bleeding (light). Dr Nori Riis noted a vaginal mass on examination. A pelvic MRI was ordered and performed on 02/28/15. It revealed a 2.3x1.9x3.9cm   Mass involving the lower half of the anterior vaginal wall, extending to the level of the introitus. The right ovary was not seen. The left ovary contained a complex cystic lesion measuring 4.1x3.2x3.4cm with multiple internal septations. No ascites was seen.   A CA 125 was drawn on 02/25/15 and was elevated at 108 (it had been drawn on 07/20/13, with a different lab assay that typically runs lower than the  current [facility] assay, and was 77.8).  The patient lives alone, she is in attendance with her daughter. She has essential HTN and CKD (20% renal function). Her past surgeries include an ex lap for repair of rectal prolapse (severe) and abdominal hysterectomy (without oophorectomy) 50 years ago. She reports this was to "stop her from having more children" and she did not have a cancer diagnosis. She has also had an appendectomy and bilateral inguinal hernia repairs.  Interval Hx:  A pre-treatment PET scan in September 2017 revealed hypermetabolic lymph nodes in the right groin and a metobolic region in the vulva/distal vagina.  She underwent primary radiation to the right groin and vulva between September 15th and November 11th, 2016 (60 Gy in 30 fractions). She tolerated treatment well.  Current Meds:  Outpatient Encounter Prescriptions as of 11/09/2015  Medication Sig  . Cholecalciferol (D3-1000) 1000 UNITS capsule Take 2,000 Units by mouth daily.  Marland Kitchen loratadine (CLARITIN) 10 MG tablet Take 1 tablet (10 mg total) by mouth daily.  Marland Kitchen omeprazole (PRILOSEC) 20 MG capsule Take 20 mg by mouth daily.  . silver sulfADIAZINE (SILVADENE) 1 % cream Apply 1 application topically daily. Reported on 07/02/2015  . traMADol (ULTRAM) 50 MG tablet Take 1-2 by mouth every 8 hours prn pain max of 6 tabs a day  . verapamil (VERELAN PM) 240 MG 24 hr capsule Take one by mouth daily  . vitamin E (VITAMIN E) 400 UNIT capsule Take 400 Units by mouth daily.    Marland Kitchen  amLODipine (NORVASC) 5 MG tablet Reported on 11/09/2015  . hydrALAZINE (APRESOLINE) 25 MG tablet Take 1 tablet (25 mg total) by mouth 3 (three) times daily.  . Probiotic Product (PROBIOTIC DAILY) CAPS florastar 250 mh take one by mouth daily (Patient not taking: Reported on 07/02/2015)  . [DISCONTINUED] ciprofloxacin (CIPRO) 250 MG tablet Take 1 tablet (250 mg total) by mouth daily. (Patient not taking: Reported on 07/02/2015)  . [DISCONTINUED] hydrALAZINE  (APRESOLINE) 25 MG tablet TAKE 1 TABLET BY MOUTH THREE TIMES DAILY  . [DISCONTINUED] mupirocin ointment (BACTROBAN) 2 % Apply 2 application topically 2 (two) times daily. For a total of 7 days   No facility-administered encounter medications on file as of 11/09/2015.    Allergy:  Allergies  Allergen Reactions  . Codeine Nausea Only  . Penicillins Rash    Pt does not remember how long ago or severity of reaction.     Social Hx:   Social History   Social History  . Marital Status: Married    Spouse Name: N/A  . Number of Children: 4  . Years of Education: N/A   Occupational History  . Not on file.   Social History Main Topics  . Smoking status: Never Smoker   . Smokeless tobacco: Not on file  . Alcohol Use: No  . Drug Use: No  . Sexual Activity: Not on file   Other Topics Concern  . Not on file   Social History Narrative    Past Surgical Hx:  Past Surgical History  Procedure Laterality Date  . Hernia repair    . Appendectomy    . Tubal ligation    . Abdominal hysterectomy    . Intramedullary (im) nail intertrochanteric Right 08/29/2013    Procedure: INTRAMEDULLARY (IM) NAIL INTERTROCHANTRIC;  Surgeon: Nita Sells, MD;  Location: [facility];  Service: Orthopedics;  Laterality: Right;    Past Medical Hx:  Past Medical History  Diagnosis Date  . Hypertension   . CVA (cerebral infarction)     Remote bilateral cerebellar infarcts on CT 8/14  . Dyslipidemia 02/28/2013  . Anginal pain ([place])   . Chronic kidney disease   . Arthritis   . COPD (chronic obstructive pulmonary disease) ([place])   . Stroke [facility]) 1978    no deficits  . Intertrochanteric fracture of right femur ([place])   . Vulvar cancer ([place])   . Radiation 04/02/15-05/29/15    vulvar region, inguinal lymph node 48 Gu    Past Gynecological History:  SVD x 5. Hysterctomy for benign indications 50 years ago.  No LMP recorded. Patient has had a hysterectomy.  Family Hx:  Family History  Problem  Relation Age of Onset  . Diabetes Mother   . Uterine cancer Sister     Review of Systems:  Constitutional  Feels well,    ENT Normal appearing ears and nares bilaterally Skin/Breast  No rash, sores, jaundice, itching, dryness Cardiovascular  No chest pain, shortness of breath, or edema  Pulmonary  No cough or wheeze.  Gastro Intestinal  No nausea, vomitting, or diarrhoea. No bright red blood per rectum, no abdominal pain, change in bowel movement, or constipation.  Genito Urinary  No frequency, urgency, dysuria, see HPI Musculo Skeletal  No myalgia, arthralgia, joint swelling or pain  Neurologic  No weakness, numbness, change in gait,  Psychology  No depression, anxiety, insomnia.   Vitals:  Blood pressure 191/84, pulse 74, temperature 97.8 F (36.6 C), temperature source Oral, resp. rate 18, height 5\' 2"  (  1.575 m), weight 104 lb 9.6 oz (47.446 kg), SpO2 98 %.  Physical Exam: WD in NAD Neck  Supple NROM, without any enlargements.  Lymph Node Survey + palpably enlarged left soft mobile lymph node - 2cm Cardiovascular  Pulse normal rate, regularity and rhythm. S1 and S2 normal.  Lungs  Clear to auscultation bilateraly, without wheezes/crackles/rhonchi. Good air movement.  Skin  No rash/lesions/breakdown  Psychiatry  Alert and oriented to person, place, and time  Abdomen  Normoactive bowel sounds, abdomen soft, non-tender and thin without evidence of hernia.  Back No CVA tenderness Genito Urinary  Vulva/vagina: No lesion on urethral meatus visible  Bladder/urethra:  No lesions or masses, well supported bladder  Vagina: complete resolution of vaginal/vulvar lesion  Cervix: surgically absent  Uterus: absent  Adnexa: no palpabe masses. Rectal  deferred Extremities  No bilateral cyanosis, clubbing or edema.  Donaciano Eva, MD   11/09/2015, 4:36 PM

## 2015-11-09 NOTE — Patient Instructions (Signed)
Plan to follow up in September 2017 or sooner if needed.  Please call for any questions or concerns.

## 2015-11-10 ENCOUNTER — Telehealth: Payer: Self-pay | Admitting: Family Medicine

## 2015-11-10 NOTE — Telephone Encounter (Signed)
Pt is calling for a refill on her Hydralazine. She has misplaced her and doesn't have a clue where it is. Meredith Bender

## 2015-11-11 MED ORDER — HYDRALAZINE HCL 25 MG PO TABS: 25.0000 mg | ORAL_TABLET | Freq: Three times a day (TID) | ORAL | Status: DC

## 2015-12-22 DIAGNOSIS — Z961 Presence of intraocular lens: Secondary | ICD-10-CM | POA: Diagnosis not present

## 2015-12-22 DIAGNOSIS — H52203 Unspecified astigmatism, bilateral: Secondary | ICD-10-CM | POA: Diagnosis not present

## 2016-01-16 DIAGNOSIS — I639 Cerebral infarction, unspecified: Secondary | ICD-10-CM

## 2016-01-16 HISTORY — DX: Cerebral infarction, unspecified: I63.9

## 2016-02-17 ENCOUNTER — Encounter: Payer: Medicare Other | Admitting: Family Medicine

## 2016-02-20 ENCOUNTER — Inpatient Hospital Stay (HOSPITAL_COMMUNITY)
Admission: EM | Admit: 2016-02-20 | Discharge: 2016-02-25 | DRG: 064 | Disposition: A | Discharge status: Skilled Nursing Facility | Payer: Medicare Other | Attending: Family Medicine | Admitting: Family Medicine

## 2016-02-20 ENCOUNTER — Encounter (HOSPITAL_COMMUNITY): Payer: Self-pay

## 2016-02-20 ENCOUNTER — Emergency Department (HOSPITAL_COMMUNITY): Payer: Medicare Other

## 2016-02-20 ENCOUNTER — Other Ambulatory Visit: Payer: Self-pay | Admitting: Family Medicine

## 2016-02-20 DIAGNOSIS — E785 Hyperlipidemia, unspecified: Secondary | ICD-10-CM | POA: Diagnosis present

## 2016-02-20 DIAGNOSIS — I69128 Other speech and language deficits following nontraumatic intracerebral hemorrhage: Secondary | ICD-10-CM | POA: Diagnosis not present

## 2016-02-20 DIAGNOSIS — Z90710 Acquired absence of both cervix and uterus: Secondary | ICD-10-CM

## 2016-02-20 DIAGNOSIS — M25561 Pain in right knee: Secondary | ICD-10-CM | POA: Diagnosis present

## 2016-02-20 DIAGNOSIS — I10 Essential (primary) hypertension: Secondary | ICD-10-CM

## 2016-02-20 DIAGNOSIS — S0990XA Unspecified injury of head, initial encounter: Secondary | ICD-10-CM | POA: Diagnosis not present

## 2016-02-20 DIAGNOSIS — Y9301 Activity, walking, marching and hiking: Secondary | ICD-10-CM | POA: Diagnosis present

## 2016-02-20 DIAGNOSIS — Z8544 Personal history of malignant neoplasm of other female genital organs: Secondary | ICD-10-CM | POA: Diagnosis not present

## 2016-02-20 DIAGNOSIS — R2681 Unsteadiness on feet: Secondary | ICD-10-CM | POA: Diagnosis present

## 2016-02-20 DIAGNOSIS — N189 Chronic kidney disease, unspecified: Secondary | ICD-10-CM

## 2016-02-20 DIAGNOSIS — I629 Nontraumatic intracranial hemorrhage, unspecified: Secondary | ICD-10-CM

## 2016-02-20 DIAGNOSIS — W1830XA Fall on same level, unspecified, initial encounter: Secondary | ICD-10-CM | POA: Diagnosis present

## 2016-02-20 DIAGNOSIS — I2510 Atherosclerotic heart disease of native coronary artery without angina pectoris: Secondary | ICD-10-CM | POA: Diagnosis present

## 2016-02-20 DIAGNOSIS — Z8781 Personal history of (healed) traumatic fracture: Secondary | ICD-10-CM

## 2016-02-20 DIAGNOSIS — Z66 Do not resuscitate: Secondary | ICD-10-CM | POA: Diagnosis present

## 2016-02-20 DIAGNOSIS — N179 Acute kidney failure, unspecified: Secondary | ICD-10-CM

## 2016-02-20 DIAGNOSIS — S52023A Displaced fracture of olecranon process without intraarticular extension of unspecified ulna, initial encounter for closed fracture: Secondary | ICD-10-CM

## 2016-02-20 DIAGNOSIS — M25562 Pain in left knee: Secondary | ICD-10-CM | POA: Diagnosis present

## 2016-02-20 DIAGNOSIS — R296 Repeated falls: Secondary | ICD-10-CM | POA: Diagnosis present

## 2016-02-20 DIAGNOSIS — Z923 Personal history of irradiation: Secondary | ICD-10-CM | POA: Diagnosis not present

## 2016-02-20 DIAGNOSIS — Z8673 Personal history of transient ischemic attack (TIA), and cerebral infarction without residual deficits: Secondary | ICD-10-CM

## 2016-02-20 DIAGNOSIS — N184 Chronic kidney disease, stage 4 (severe): Secondary | ICD-10-CM | POA: Diagnosis present

## 2016-02-20 DIAGNOSIS — R55 Syncope and collapse: Secondary | ICD-10-CM | POA: Diagnosis not present

## 2016-02-20 DIAGNOSIS — R54 Age-related physical debility: Secondary | ICD-10-CM | POA: Diagnosis not present

## 2016-02-20 DIAGNOSIS — I129 Hypertensive chronic kidney disease with stage 1 through stage 4 chronic kidney disease, or unspecified chronic kidney disease: Secondary | ICD-10-CM | POA: Diagnosis present

## 2016-02-20 DIAGNOSIS — J449 Chronic obstructive pulmonary disease, unspecified: Secondary | ICD-10-CM | POA: Diagnosis present

## 2016-02-20 DIAGNOSIS — G9340 Encephalopathy, unspecified: Secondary | ICD-10-CM | POA: Insufficient documentation

## 2016-02-20 DIAGNOSIS — S52021A Displaced fracture of olecranon process without intraarticular extension of right ulna, initial encounter for closed fracture: Secondary | ICD-10-CM | POA: Diagnosis not present

## 2016-02-20 DIAGNOSIS — G936 Cerebral edema: Secondary | ICD-10-CM | POA: Diagnosis present

## 2016-02-20 DIAGNOSIS — I614 Nontraumatic intracerebral hemorrhage in cerebellum: Secondary | ICD-10-CM | POA: Diagnosis not present

## 2016-02-20 DIAGNOSIS — R413 Other amnesia: Secondary | ICD-10-CM | POA: Diagnosis present

## 2016-02-20 DIAGNOSIS — S52024D Nondisplaced fracture of olecranon process without intraarticular extension of right ulna, subsequent encounter for closed fracture with routine healing: Secondary | ICD-10-CM | POA: Diagnosis not present

## 2016-02-20 DIAGNOSIS — M25521 Pain in right elbow: Secondary | ICD-10-CM | POA: Diagnosis not present

## 2016-02-20 DIAGNOSIS — I619 Nontraumatic intracerebral hemorrhage, unspecified: Secondary | ICD-10-CM | POA: Diagnosis not present

## 2016-02-20 DIAGNOSIS — S06300A Unspecified focal traumatic brain injury without loss of consciousness, initial encounter: Secondary | ICD-10-CM | POA: Diagnosis not present

## 2016-02-20 DIAGNOSIS — I639 Cerebral infarction, unspecified: Secondary | ICD-10-CM | POA: Diagnosis not present

## 2016-02-20 DIAGNOSIS — I6930 Unspecified sequelae of cerebral infarction: Secondary | ICD-10-CM | POA: Diagnosis not present

## 2016-02-20 LAB — URINALYSIS, ROUTINE W REFLEX MICROSCOPIC
BILIRUBIN URINE: NEGATIVE
GLUCOSE, UA: NEGATIVE mg/dL
HGB URINE DIPSTICK: NEGATIVE
Ketones, ur: NEGATIVE mg/dL
Nitrite: NEGATIVE
SPECIFIC GRAVITY, URINE: 1.019 (ref 1.005–1.030)
pH: 6 (ref 5.0–8.0)

## 2016-02-20 LAB — CBC
HCT: 37.2 % (ref 36.0–46.0)
Hemoglobin: 11.7 g/dL — ABNORMAL LOW (ref 12.0–15.0)
MCH: 27.9 pg (ref 26.0–34.0)
MCHC: 31.5 g/dL (ref 30.0–36.0)
MCV: 88.8 fL (ref 78.0–100.0)
PLATELETS: 233 10*3/uL (ref 150–400)
RBC: 4.19 MIL/uL (ref 3.87–5.11)
RDW: 14.2 % (ref 11.5–15.5)
WBC: 6.3 10*3/uL (ref 4.0–10.5)

## 2016-02-20 LAB — BASIC METABOLIC PANEL
Anion gap: 7 (ref 5–15)
BUN: 36 mg/dL — ABNORMAL HIGH (ref 6–20)
CALCIUM: 8.8 mg/dL — AB (ref 8.9–10.3)
CHLORIDE: 108 mmol/L (ref 101–111)
CO2: 22 mmol/L (ref 22–32)
CREATININE: 2.37 mg/dL — AB (ref 0.44–1.00)
GFR calc non Af Amer: 17 mL/min — ABNORMAL LOW (ref >60)
GFR, EST AFRICAN AMERICAN: 20 mL/min — AB (ref >60)
GLUCOSE: 103 mg/dL — AB (ref 65–99)
Potassium: 4.5 mmol/L (ref 3.5–5.1)
Sodium: 137 mmol/L (ref 135–145)

## 2016-02-20 LAB — URINE MICROSCOPIC-ADD ON: Bacteria, UA: NONE SEEN

## 2016-02-20 LAB — CBG MONITORING, ED: Glucose-Capillary: 107 mg/dL — ABNORMAL HIGH (ref 65–99)

## 2016-02-20 MED ORDER — POLYETHYLENE GLYCOL 3350 17 G PO PACK
17.0000 g | PACK | Freq: Every day | ORAL | Status: DC | PRN
  Administered 2016-02-21: 17 g via ORAL
  Filled 2016-02-20: qty 1

## 2016-02-20 MED ORDER — HEPARIN SODIUM (PORCINE) 5000 UNIT/ML IJ SOLN: 5000.0000 [IU] | Freq: Three times a day (TID) | INTRAMUSCULAR | Status: DC

## 2016-02-20 MED ORDER — ACETAMINOPHEN 650 MG RE SUPP: 650.0000 mg | Freq: Four times a day (QID) | RECTAL | Status: DC | PRN

## 2016-02-20 MED ORDER — HYDRALAZINE HCL 25 MG PO TABS
25.0000 mg | ORAL_TABLET | Freq: Once | ORAL | Status: AC
  Administered 2016-02-20: 25 mg via ORAL
  Filled 2016-02-20: qty 1

## 2016-02-20 MED ORDER — ACETAMINOPHEN 325 MG PO TABS
650.0000 mg | ORAL_TABLET | Freq: Four times a day (QID) | ORAL | Status: DC | PRN
  Administered 2016-02-21 – 2016-02-25 (×7): 650 mg via ORAL
  Filled 2016-02-20 (×7): qty 2

## 2016-02-20 MED ORDER — LORATADINE 10 MG PO TABS
10.0000 mg | ORAL_TABLET | Freq: Every day | ORAL | Status: DC
  Administered 2016-02-21 – 2016-02-25 (×5): 10 mg via ORAL
  Filled 2016-02-20 (×7): qty 1

## 2016-02-20 MED ORDER — SODIUM CHLORIDE 0.9% FLUSH
3.0000 mL | Freq: Two times a day (BID) | INTRAVENOUS | Status: DC
  Administered 2016-02-20 – 2016-02-25 (×10): 3 mL via INTRAVENOUS

## 2016-02-20 MED ORDER — TRAMADOL HCL 50 MG PO TABS
50.0000 mg | ORAL_TABLET | Freq: Once | ORAL | Status: AC
  Administered 2016-02-20: 50 mg via ORAL
  Filled 2016-02-20: qty 1

## 2016-02-20 MED ORDER — VERAPAMIL HCL 120 MG PO TABS
240.0000 mg | ORAL_TABLET | Freq: Once | ORAL | Status: AC
  Administered 2016-02-20: 240 mg via ORAL
  Filled 2016-02-20: qty 2

## 2016-02-20 MED ORDER — HYDRALAZINE HCL 25 MG PO TABS: 25.0000 mg | ORAL_TABLET | Freq: Once | ORAL | Status: AC

## 2016-02-20 MED ORDER — VERAPAMIL HCL ER 240 MG PO CP24: 240.0000 mg | ORAL_CAPSULE | Freq: Every day | ORAL | Status: DC

## 2016-02-20 MED ORDER — TRAMADOL HCL 50 MG PO TABS
50.0000 mg | ORAL_TABLET | Freq: Four times a day (QID) | ORAL | Status: DC | PRN
  Administered 2016-02-21 – 2016-02-25 (×14): 50 mg via ORAL
  Filled 2016-02-20 (×16): qty 1

## 2016-02-20 MED ORDER — SODIUM CHLORIDE 0.9 % IV SOLN: 250.0000 mL | INTRAVENOUS | Status: DC | PRN

## 2016-02-20 MED ORDER — SODIUM CHLORIDE 0.9% FLUSH: 3.0000 mL | INTRAVENOUS | Status: DC | PRN

## 2016-02-20 MED ORDER — HYDRALAZINE HCL 25 MG PO TABS
25.0000 mg | ORAL_TABLET | Freq: Three times a day (TID) | ORAL | Status: DC
  Administered 2016-02-20 – 2016-02-22 (×5): 25 mg via ORAL
  Filled 2016-02-20 (×5): qty 1

## 2016-02-20 MED ORDER — PANTOPRAZOLE SODIUM 40 MG PO TBEC
40.0000 mg | DELAYED_RELEASE_TABLET | Freq: Every day | ORAL | Status: DC
  Administered 2016-02-20 – 2016-02-25 (×6): 40 mg via ORAL
  Filled 2016-02-20 (×6): qty 1

## 2016-02-20 MED ORDER — ACETAMINOPHEN 325 MG PO TABS
650.0000 mg | ORAL_TABLET | Freq: Once | ORAL | Status: AC
  Administered 2016-02-20: 650 mg via ORAL
  Filled 2016-02-20: qty 2

## 2016-02-20 MED ORDER — HYDRALAZINE HCL 20 MG/ML IJ SOLN
5.0000 mg | INTRAMUSCULAR | Status: DC | PRN
  Administered 2016-02-21 (×2): 5 mg via INTRAVENOUS
  Filled 2016-02-20 (×2): qty 1

## 2016-02-20 NOTE — ED Provider Notes (Signed)
Patient reports about 4 days ago she got up during the night use the bathroom and she fell. She does not describe any precipitating event such as tripping or feeling dizzy. Today she was at the [employer] with her daughter and didn't have her cane. She states she reached out to grab her daughter's arm and she fell. She complains of pain in her right elbow.  Patient is frail elderly female who standing beside the bed. She is alert and cooperative. On exam of her all but she's noted to have some bruising of the proximal forearm however there is no joint effusion felt. She keeps her elbow flexed but she has good range of motion.  Her urinalysis does show some more than expected white blood cells. Her urine was cultured and she was started on oral antibiotics. This may be the reason for her falling and making her little bit weaker than usual.   Medical screening examination/treatment/procedure(s) were conducted as a shared visit with non-physician practitioner(s) and myself.  I personally evaluated the patient during the encounter.   EKG Interpretation None       Rolland Porter, MD, Barbette Or, MD 02/20/16 [phone]

## 2016-02-20 NOTE — H&P (Signed)
[facility] Admission History and Physical Service Pager: [phone]  Patient name: Meredith Bender Medical record number: [id number] Date of birth: [birth year] Age: 82 y.o. Gender: female  Primary Care Provider: [name], MD Consultants: Neurology Code Status: DNR, but would like intubation if necessary  Chief Complaint: Olecranon fracture and opacity on CT head  Assessment and Plan: Janifer L Hengst is a 82 y.o. female presenting with a left olecranon fracture s/p fall, found to have a possible small R cerebellar hemorrhage. PMH is significant for CKD 4, HLD, HTN, vulvar cancer (s/p radiation), cystitis, BPPV?, arthritic knee pain, previous hip fracture, and previous cerebellar infarcts.  Recent falls: CT concerning for cerebellar hemorrhage. Per radiologist, may be possible calcification (image 7, series 2) but because surrounded by edema cannot rule out mass or a vascular abnormality. Not on aspirin due to stomach issues. Denies hitting head, loss of consciousness, sensation of warmth or visual changes. No focal weakness or loss of sensation. Will pursue further work-up. ABCVD score 4 - moderate risk. Increased concern for CVA given uncontrolled BP on admission. No leukocytosis or fevers.  - Admit to Med-Surg, attending Dr. Ree Kida - Ordered MRI brain wo contrast (low GFR) - Radiologist Dr. Jimmye Norman recommended MRI w/wo to follow-up but warned negative MRI at this time may not rule out mass at this time if obscured by blood. - Neurology consulted, appreciate recommendations: keep BP </= 160/90; no anticoagulants or antithrombotics - q4h neuro checks while awake - PT, OT consulted - UA with trace leukocytes and 6-30 WBC on add-on; Urine culture pending - Will obtain risk start labs with am labs (lipids, PT, PTT, ESR)  HTN: Hypertensive to 225/140 on admission in setting of not taking am medications. Question of whether patient is taking verapamil. Patient and  daughter say she has been, but pharmacist did not think she was taking based on her review. - Continue home hydralazine 25 mg TID - Ordered verapamil 240 mg, as patient had good response in ED - PRN hydralazine 5 mg IV q4h to keep BP </= 160/90  Olecranon fracture: Fracture of proximal aspect noted on x-ray 8/5. S/p splint and sling placement - Continue home tramadol 50 mg q6h prn - Tylenol prn - f/u with ortho outpatient  CKD-IV: BL SCr ~ 2.2. Was 2.37 on admission.  - Monitor renal function - Avoid nephrotoxic medications  FEN/GI: Heart Healthy Diet, prilosec Prophylaxis: SCDs  Disposition: Admit for further imaging and monitoring of BP.   History of Present Illness:  Meredith Bender is a 82 y.o. female presenting with 2 falls in the past week. Thursday night, she fell down in hallway on way to bathroom and fell and hit her elbow. Remembers falling and did not lose consciousness. [place] today, as well, at [employer] and went down on her knees when putting bags into car. Sometimes uses a cane to help her walk. Can walk independently but is usually holding on to something, per son. Another son mentioned that pt had commented that she was having issues with vertigo earlier this week unlike others she has experienced. Occasionally has "inner ear problem" that can bother her eyes. No nausea or vomiting. No headaches. No weakness or blurry vision. Has been more off balance per daughter today. No loss of appetite or trouble swallowing.    Review Of Systems: Per HPI with the following additions: No CP, SOB, dysuria, dark stools, or dizziness.  Remainder of the systems were negative.  Patient Active Problem  List   Diagnosis Date Noted  . Intracranial hemorrhage ([place]) 02/20/2016  . 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 Dysuria 05/05/2015  . Primary vulvar cancer ([place]) 03/19/2015  . Urethral lesion 03/06/2015  . Vulvar mass 03/06/2015  . Left ovarian cyst 03/06/2015  . Postmenopausal vaginal  bleeding 02/25/2015  . s/p hysterectomy for prolapsed uterus 02/25/2015  . Vaginal mass 02/25/2015  . History of repair for rectocele 02/25/2015  . Living accommodation issues 12/18/2014  . Knee pain, bilateral 01/09/2014  . Rotator cuff syndrome of both shoulders 01/09/2014  . Arthritis 12/02/2013  . DNR (do not resuscitate) discussion 12/02/2013  . Intertrochanteric fracture of right hip ([place]) 08/29/2013  . History of UTI 06/12/2013  . Routine health maintenance 06/12/2013  . CKD (chronic kidney disease) stage 4, GFR 15-29 ml/min ([facility]) 02/28/2013  . Dyslipidemia 02/28/2013  . Hypertension     Past Medical History: Past Medical History:  Diagnosis Date  . Anginal pain ([place])   . Arthritis   . Chronic kidney disease   . COPD (chronic obstructive pulmonary disease) ([place])   . CVA (cerebral infarction)    Remote bilateral cerebellar infarcts on CT 8/14  . Dyslipidemia 02/28/2013  . Hypertension   . Intertrochanteric fracture of right femur ([place])   . Radiation 04/02/15-05/29/15   vulvar region, inguinal lymph node 60 Gu  . Stroke [facility]) 1978   no deficits  . Vulvar cancer [facility])     Past Surgical History: Past Surgical History:  Procedure Laterality Date  . ABDOMINAL HYSTERECTOMY    . APPENDECTOMY    . HERNIA REPAIR    . INTRAMEDULLARY (IM) NAIL INTERTROCHANTERIC Right 08/29/2013   Procedure: INTRAMEDULLARY (IM) NAIL INTERTROCHANTRIC;  Surgeon: Nita Sells, MD;  Location: [place];  Service: Orthopedics;  Laterality: Right;  . TUBAL LIGATION      Social History: Social History  Substance Use Topics  . Smoking status: Never Smoker  . Smokeless tobacco: Never Used  . Alcohol use No   Additional social history: Lives alone, son helps with yard work and going to store Please also refer to relevant sections of EMR.  Family History: Family History  Problem Relation Age of Onset  . Diabetes Mother   . Uterine cancer Sister     Allergies and  Medications: Allergies  Allergen Reactions  . Codeine Nausea Only  . Penicillins Rash    Pt does not remember how long ago or severity of reaction.    No current facility-administered medications on file prior to encounter.    Current Outpatient Prescriptions on File Prior to Encounter  Medication Sig Dispense Refill  . Cholecalciferol (D3-1000) 1000 UNITS capsule Take 2,000 Units by mouth daily.    . hydrALAZINE (APRESOLINE) 25 MG tablet Take 1 tablet (25 mg total) by mouth 3 (three) times daily. 270 tablet 3  . loratadine (CLARITIN) 10 MG tablet Take 1 tablet (10 mg total) by mouth daily. 90 tablet 3  . omeprazole (PRILOSEC) 20 MG capsule Take 20 mg by mouth daily.    . silver sulfADIAZINE (SILVADENE) 1 % cream Apply 1 application topically daily. Reported on 07/02/2015    . traMADol (ULTRAM) 50 MG tablet Take 1-2 by mouth every 8 hours prn pain max of 6 tabs a day 180 tablet 5  . vitamin E (VITAMIN E) 400 UNIT capsule Take 400 Units by mouth daily.      . Probiotic Product (PROBIOTIC DAILY) CAPS florastar 250 mh take one by mouth daily (Patient not taking: Reported on 07/02/2015)  100 capsule 3  . verapamil (VERELAN PM) 240 MG 24 hr capsule Take one by mouth daily (Patient not taking: Reported on 02/20/2016) 90 capsule 3    Objective: BP 189/71 (BP Location: Right Arm)   Pulse 73   Temp 98 F (36.7 C) (Oral)   Resp 18   Ht 5' 4" (1.626 m)   Wt 104 lb 6.4 oz (47.4 kg)   SpO2 94%   BMI 17.92 kg/m  Exam: General: Well-appearing, older female in NAD Eyes: PERRLA, no nystagmus, EOMI ENTM: Mild swelling of nasal turbinates, MMM Neck: Supple, no JVD Cardiovascular: RRR, S1, S2, no m/r/g; no bruits heard Respiratory: CTAB, no increased WOB Abdomen: +BS, S, NT, ND MSK: Normal tone, gait normal. Right arm immobilized in sling and splint.  Skin: No rashes or lesions Neuro: CNII-XII intact, no focal deficits. AOx3. Patellar reflexes 2+ bilaterally. Finger-to-nose testing on L  intact. Psych: Normal mood and affect.   Labs and Imaging: CBC BMET   Recent Labs Lab 02/20/16 1220  WBC 6.3  HGB 11.7*  HCT 37.2  PLT 233    Recent Labs Lab 02/20/16 1220  NA 137  K 4.5  CL 108  CO2 22  BUN 36*  CREATININE 2.37*  GLUCOSE 103*  CALCIUM 8.8*     Dg Elbow Complete Right  Result Date: 02/20/2016 CLINICAL DATA:  Pain after fall EXAM: RIGHT ELBOW - COMPLETE 3+ VIEW COMPARISON:  None. FINDINGS: There is a calcification just proximal to the olecranon process with acute angles, favored to represent an acute fracture off the olecranon process. No other bony or soft tissue abnormalities are identified. IMPRESSION: Acute fracture off the proximal aspect of the olecranon process. Electronically Signed   By: Dorise Bullion III M.D   On: 02/20/2016 15:44   Ct Head Wo Contrast  Result Date: 02/20/2016 CLINICAL DATA:  Status post fall. EXAM: CT HEAD WITHOUT CONTRAST TECHNIQUE: Contiguous axial images were obtained from the base of the skull through the vertex without intravenous contrast. COMPARISON:  None. FINDINGS: Paranasal sinuses, mastoid air cells, bones, and extracranial soft tissues are within normal limits. No subdural, epidural, or subarachnoid hemorrhage. There is a small rounded region of high attenuation in the right cerebellar hemisphere on series 2, image 7 measuring 6 mm with a halo of surrounding low attenuation. Remainder of the cerebellum is normal. The basal cisterns are patent. Moderate to severe white matter changes are noted. A lacunar infarct in the right thalamus is age indeterminate. No other evidence of acute ischemia or infarct. No midline shift. Ventricles and sulci are normal for age. IMPRESSION: 1. 6 mm rounded region of high attenuation the right cerebellar hemisphere with surrounding low attenuation. This is concerning for a small hemorrhage with surrounding edema. The etiology is unclear. It would be an unusual site for trauma but a posttraumatic  cause is not excluded. Hemorrhagic transformation of a small infarct is possible. Bleeding of an underlying vascular abnormality or mass is not excluded. A hypertensive bleed could also occur in this location. An MRI could better assess this region. 2. Age indeterminate lacunar infarct of the right thalamus. Findings called to Dr. Eulis Foster. Electronically Signed   By: Dorise Bullion III M.D   On: 02/20/2016 17:22     Cem Kosman Corinda Gubler, MD 02/20/2016, 6:19 PM PGY-2, [facility] pager: [phone], text pages welcome

## 2016-02-20 NOTE — ED Provider Notes (Signed)
[facility] DEPT Provider Note   CSN: [id number] Arrival date & time: 02/20/16  1158  First Provider Contact:  First MD Initiated Contact with Patient 02/20/16 1402        History   Chief Complaint Chief Complaint  Patient presents with  . Near Syncope  . Arm Injury    HPI Lavender L Rutt is a 82 y.o. female.  Kimmora L Leaming is a 82 y.o. female with h/o CKD, CVA w/o residual effects, dyslipidemia, HTN, arthritis, and vulvar cancer presents to ED with complaint of fall. Patient reports she [place], when her daughter let go of her arm she fell onto her knees. She denies knee pain. She denies head trauma. No loss of consciousness. No lightheadedness, dizziness, nausea, or diaphoresis leading up to the fall. She also reports that she fell on Tuesday, landing on her right elbow. She has associated right elbow pain, swelling, and bruising. She denies warmth to the area. She is unclear how she fell, denies tripping on anything. She denies hitting her head at this time. No LOC. She denies prodromal symptoms suggestive of syncope. She denies fever, chest pain, shortness of breath, abdominal complaints, or urinary complaints. She lives at home alone. She reports she did not eat breakfast this morning and did not her blood pressure medication.       Past Medical History:  Diagnosis Date  . Anginal pain ([place])   . Arthritis   . Chronic kidney disease   . COPD (chronic obstructive pulmonary disease) ([place])   . CVA (cerebral infarction)    Remote bilateral cerebellar infarcts on CT 8/14  . Dyslipidemia 02/28/2013  . Hypertension   . Intertrochanteric fracture of right femur ([place])   . Radiation 04/02/15-05/29/15   vulvar region, inguinal lymph node 60 Gu  . Stroke [facility]) 1978   no deficits  . Vulvar cancer [facility])     Patient Active Problem List   Diagnosis Date Noted  . Intracranial hemorrhage ([place]) 02/20/2016  . 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  Dysuria 05/05/2015  . Primary vulvar cancer ([place]) 03/19/2015  . Urethral lesion 03/06/2015  . Vulvar mass 03/06/2015  . Left ovarian cyst 03/06/2015  . Postmenopausal vaginal bleeding 02/25/2015  . s/p hysterectomy for prolapsed uterus 02/25/2015  . Vaginal mass 02/25/2015  . History of repair for rectocele 02/25/2015  . Living accommodation issues 12/18/2014  . Knee pain, bilateral 01/09/2014  . Rotator cuff syndrome of both shoulders 01/09/2014  . Arthritis 12/02/2013  . DNR (do not resuscitate) discussion 12/02/2013  . Intertrochanteric fracture of right hip ([place]) 08/29/2013  . History of UTI 06/12/2013  . Routine health maintenance 06/12/2013  . CKD (chronic kidney disease) stage 4, GFR 15-29 ml/min (HCC) 02/28/2013  . Dyslipidemia 02/28/2013  . Hypertension     Past Surgical History:  Procedure Laterality Date  . ABDOMINAL HYSTERECTOMY    . APPENDECTOMY    . HERNIA REPAIR    . INTRAMEDULLARY (IM) NAIL INTERTROCHANTERIC Right 08/29/2013   Procedure: INTRAMEDULLARY (IM) NAIL INTERTROCHANTRIC;  Surgeon: Nita Sells, MD;  Location: [place];  Service: Orthopedics;  Laterality: Right;  . TUBAL LIGATION      OB History    No data available       Home Medications    Prior to Admission medications   Medication Sig Start Date End Date Taking? Authorizing Provider  Cholecalciferol (D3-1000) 1000 UNITS capsule Take 2,000 Units by mouth daily.   Yes Historical Provider, MD  fexofenadine (ALLEGRA) 30 MG tablet Take 30 mg by mouth daily.   Yes Historical Provider, MD  hydrALAZINE (APRESOLINE) 25 MG tablet Take 1 tablet (25 mg total) by mouth 3 (three) times daily. 11/11/15  Yes [name], MD  loratadine (CLARITIN) 10 MG tablet Take 1 tablet (10 mg total) by mouth daily. 12/17/14  Yes [name], MD  omeprazole (PRILOSEC) 20 MG capsule Take 20 mg by mouth daily.   Yes Historical Provider, MD  silver sulfADIAZINE (SILVADENE) 1 % cream Apply 1 application topically daily.  Reported on 07/02/2015   Yes Historical Provider, MD  traMADol (ULTRAM) 50 MG tablet Take 1-2 by mouth every 8 hours prn pain max of 6 tabs a day 08/26/15  Yes [name], MD  vitamin E (VITAMIN E) 400 UNIT capsule Take 400 Units by mouth daily.     Yes Historical Provider, MD  Probiotic Product (PROBIOTIC DAILY) CAPS florastar 250 mh take one by mouth daily Patient not taking: Reported on 07/02/2015 12/03/13   [name], MD  verapamil (VERELAN PM) 240 MG 24 hr capsule Take one by mouth daily Patient not taking: Reported on 02/20/2016 01/07/15   [name], MD    Family History Family History  Problem Relation Age of Onset  . Diabetes Mother   . Uterine cancer Sister     Social History Social History  Substance Use Topics  . Smoking status: Never Smoker  . Smokeless tobacco: Never Used  . Alcohol use No     Allergies   Codeine and Penicillins   Review of Systems Review of Systems  Musculoskeletal: Positive for arthralgias and joint swelling.  Skin: Positive for color change.  All other systems reviewed and are negative.    Physical Exam Updated Vital Signs BP 189/71 (BP Location: Right Arm)   Pulse 73   Temp 98 F (36.7 C) (Oral)   Resp 18   Ht 5\' 4"  (1.626 m)   Wt 47.4 kg   SpO2 94%   BMI 17.92 kg/m   Physical Exam  Constitutional: She appears well-developed and well-nourished. No distress.  HENT:  Head: Normocephalic and atraumatic.  Mouth/Throat: Oropharynx is clear and moist. No oropharyngeal exudate.  Eyes: Conjunctivae and EOM are normal. Pupils are equal, round, and reactive to light. Right eye exhibits no discharge. Left eye exhibits no discharge. No scleral icterus.  Neck: Normal range of motion. Neck supple.  Cardiovascular: Normal rate, regular rhythm, normal heart sounds and intact distal pulses.   No murmur heard. Pulmonary/Chest: Effort normal and breath sounds normal. No respiratory distress.  Abdominal: Soft. Bowel sounds are normal. There is  no tenderness. There is no rebound and no guarding.  Musculoskeletal: Normal range of motion.       Right shoulder: She exhibits bony tenderness, crepitus and pain.  Ecchymosis of right elbow. TTP of right elbow with crepitus. ROM intact. Strength intact; although pain with elbow extension. Sensation intact. 2+ radial pulses b/l. Capillary refill <2 seconds.   Lymphadenopathy:    She has no cervical adenopathy.  Neurological: She is alert. She is not disoriented. Coordination normal. GCS eye subscore is 4. GCS verbal subscore is 5. GCS motor subscore is 6.  Mental Status:  Alert, thought content appropriate, able to give a coherent history. Speech fluent without evidence of aphasia. Able to follow 2 step commands without difficulty.  Cranial Nerves:  II:  Peripheral visual fields grossly normal, pupils equal, round, reactive to light III,IV, VI: ptosis  not present, extra-ocular motions intact bilaterally  V,VII: smile symmetric, facial light touch sensation equal VIII: hearing grossly normal to voice  X: uvula elevates symmetrically  XI: bilateral shoulder shrug symmetric and strong XII: midline tongue extension without fassiculations Motor:  Normal tone. 5/5 in upper and lower extremities bilaterally including strong and equal grip strength and dorsiflexion/plantar flexion Sensory: light touch normal in all extremities. Cerebellar: normal finger-to-nose with bilateral upper extremities Gait: shuffling gait noted, per daughter this is baseline, requires some assistance with walking, no sway or leaning noted CV: distal pulses palpable throughout   Skin: Skin is warm and dry. She is not diaphoretic.  Psychiatric: She has a normal mood and affect.     ED Treatments / Results  Labs (all labs ordered are listed, but only abnormal results are displayed) Labs Reviewed  BASIC METABOLIC PANEL - Abnormal; Notable for the following:       Result Value   Glucose, Bld 103 (*)    BUN 36 (*)     Creatinine, Ser 2.37 (*)    Calcium 8.8 (*)    GFR calc non Af Amer 17 (*)    GFR calc Af Amer 20 (*)    All other components within normal limits  CBC - Abnormal; Notable for the following:    Hemoglobin 11.7 (*)    All other components within normal limits  URINALYSIS, ROUTINE W REFLEX MICROSCOPIC (NOT AT [facility]) - Abnormal; Notable for the following:    Protein, ur >300 (*)    Leukocytes, UA TRACE (*)    All other components within normal limits  URINE MICROSCOPIC-ADD ON - Abnormal; Notable for the following:    Squamous Epithelial / LPF 0-5 (*)    All other components within normal limits  CBG MONITORING, ED - Abnormal; Notable for the following:    Glucose-Capillary 107 (*)    All other components within normal limits  URINE CULTURE    EKG  EKG Interpretation None       Radiology Dg Elbow Complete Right  Result Date: 02/20/2016 CLINICAL DATA:  Pain after fall EXAM: RIGHT ELBOW - COMPLETE 3+ VIEW COMPARISON:  None. FINDINGS: There is a calcification just proximal to the olecranon process with acute angles, favored to represent an acute fracture off the olecranon process. No other bony or soft tissue abnormalities are identified. IMPRESSION: Acute fracture off the proximal aspect of the olecranon process. Electronically Signed   By: Dorise Bullion III M.D   On: 02/20/2016 15:44   Ct Head Wo Contrast  Result Date: 02/20/2016 CLINICAL DATA:  Status post fall. EXAM: CT HEAD WITHOUT CONTRAST TECHNIQUE: Contiguous axial images were obtained from the base of the skull through the vertex without intravenous contrast. COMPARISON:  None. FINDINGS: Paranasal sinuses, mastoid air cells, bones, and extracranial soft tissues are within normal limits. No subdural, epidural, or subarachnoid hemorrhage. There is a small rounded region of high attenuation in the right cerebellar hemisphere on series 2, image 7 measuring 6 mm with a halo of surrounding low attenuation. Remainder of the cerebellum is  normal. The basal cisterns are patent. Moderate to severe white matter changes are noted. A lacunar infarct in the right thalamus is age indeterminate. No other evidence of acute ischemia or infarct. No midline shift. Ventricles and sulci are normal for age. IMPRESSION: 1. 6 mm rounded region of high attenuation the right cerebellar hemisphere with surrounding low attenuation. This is concerning for a small hemorrhage with surrounding edema. The etiology is unclear.  It would be an unusual site for trauma but a posttraumatic cause is not excluded. Hemorrhagic transformation of a small infarct is possible. Bleeding of an underlying vascular abnormality or mass is not excluded. A hypertensive bleed could also occur in this location. An MRI could better assess this region. 2. Age indeterminate lacunar infarct of the right thalamus. Findings called to Dr. Eulis Foster. Electronically Signed   By: Dorise Bullion III M.D   On: 02/20/2016 17:22    Procedures Procedures (including critical care time)  Medications Ordered in ED Medications  hydrALAZINE (APRESOLINE) tablet 25 mg (25 mg Oral Given 02/20/16 1529)  verapamil (CALAN) tablet 240 mg (240 mg Oral Given 02/20/16 1615)     Initial Impression / Assessment and Plan / ED Course  I have reviewed the triage vital signs and the nursing notes.  Pertinent labs & imaging results that were available during my care of the patient were reviewed by me and considered in my medical decision making (see chart for details).  Vitals:   02/20/16 1515 02/20/16 1545 02/20/16 1617 02/20/16 1648  BP: (!) 220/134 (!) 225/140 194/96 189/71  Pulse: 86  93 73  Resp: 25  20 18   Temp:      TempSrc:      SpO2: 100%  97% 94%  Weight:      Height:      / Clinical Course  Value Comment By Time  DG Elbow Complete Right Olecranon fracture noted.  Roxanna Mew, [place] 08/05 1700    Patient is afebrile and non-toxic appearing in NAD. Vital signs remarkable for elevated BP,  otherwise stable. Patient states she did not take her blood pressure medication this morning. She is asx. Physical exam remarkable for ecchymosis and crepitus of right elbow, she is neurovascularly intact. No neurologic deficits, patient has a shuffling walk at baseline, no leaning or swaying. Given increase in falls will CT head. Given pain in elbow and recent fall with h/o osteoporosis will x-ray right elbow. Will check basic labs. Will give home dose of BP medication.   X-ray of elbow remarkable for olecranon fracture. Will place in splint and f/u with ortho. CBC remarkable for anemia - however stable compared to previous. BMP shows mild elevation of creatinine; however, stable compared to previous. U/A remarkable for protein and trace leukocytes and WBC - will culture urine.   CT head concerning for 10mm hemorrhage in right cerebral hemisphere. It is unclear the etiology. Consult to neurology. Given these findings plan to admit. Consult to [facility] placed.   5:49 PM: Spoke with Neurology, greatly appreciated their time and input. Agree to see patient.   6:09 PM: Spoke with Family Medicine, appreciate their time and input. Agree to admit patient for further management of possible intracranial hemorrhage.    Final Clinical Impressions(s) / ED Diagnoses   Final diagnoses:  Olecranon fracture, right, closed, initial encounter  Essential hypertension  Intracranial hemorrhage [facility])    New Prescriptions New Prescriptions   No medications on file     Roxanna Mew, PA-C 02/20/16 [place], MD 02/21/16 2258

## 2016-02-20 NOTE — ED Notes (Signed)
Patient returned from CT

## 2016-02-20 NOTE — Consult Note (Addendum)
Neurology Consultation Reason for Consult: Stroke Referring Physician: Eulis Foster, E  CC: Unsteadiness  History is obtained from: Patient, but primarily from daughter  HPI: Meredith Bender is a 82 y.o. female with a history of cerebellar infarcts who presents with unsteadiness for the past week. She had a fall on Tuesday and had to have her arm casted at that time. She apparently complained to her son either Monday or Sunday that she was having vertigo that was "not like her in her ear problems." Over the past week she has been having frequent falls and severe unsteadiness. Today, she fell while walking with her daughter in a parking lot and therefore was brought into the emergency room where a CT was obtained which did demonstrate a small cerebellar hemorrhage.   LKW: July 31 tpa given?: no, out of window, ICH    ROS: A 14 point ROS was performed and is negative except as noted in the HPI.   Past Medical History:  Diagnosis Date  . Anginal pain ([place])   . Arthritis   . Chronic kidney disease   . COPD (chronic obstructive pulmonary disease) ([place])   . CVA (cerebral infarction)    Remote bilateral cerebellar infarcts on CT 8/14  . Dyslipidemia 02/28/2013  . Hypertension   . Intertrochanteric fracture of right femur ([place])   . Radiation 04/02/15-05/29/15   vulvar region, inguinal lymph node 60 Gu  . Stroke [facility]) 1978   no deficits  . Vulvar cancer ([place])      Family History  Problem Relation Age of Onset  . Diabetes Mother   . Uterine cancer Sister      Social History:  reports that she has never smoked. She has never used smokeless tobacco. She reports that she does not drink alcohol or use drugs.   Exam: Current vital signs: BP 150/80   Pulse 83   Temp 98 F (36.7 C) (Oral)   Resp 24   Ht 5\' 4"  (1.626 m)   Wt 47.4 kg (104 lb 6.4 oz)   SpO2 94%   BMI 17.92 kg/m  Vital signs in last 24 hours: Temp:  [97.7 F (36.5 C)-98 F (36.7 C)] 98 F (36.7 C) (08/05 1322) Pulse  Rate:  [67-93] 83 (08/05 1930) Resp:  [15-26] 24 (08/05 1930) BP: (150-225)/(68-140) 150/80 (08/05 1930) SpO2:  [93 %-100 %] 94 % (08/05 1930) Weight:  [47.4 kg (104 lb 6.4 oz)] 47.4 kg (104 lb 6.4 oz) (08/05 1225)   Physical Exam  Constitutional: Appears well-developed and well-nourished.  Psych: Affect appropriate to situation Eyes: No scleral injection HENT: No OP obstrucion Head: Normocephalic.  Cardiovascular: Normal rate and regular rhythm.  Respiratory: Effort normal and breath sounds normal to anterior ascultation GI: Soft.  No distension. There is no tenderness.  Skin: WDI  Neuro: Mental Status: Patient is awake, alert, oriented to person, place, month, year, and situation. Difficulty with spelling world backwards, unable to give number of quarters in $2.75 Patient is a difficult time with giving me a recent history of events. She appears to have some memory problems. No signs of aphasia or neglect Cranial Nerves: II: Visual Fields are full. Pupils are equal, round, and reactive to light.   III,IV, VI: EOMI without ptosis or diploplia.  V: Facial sensation is symmetric to temperature VII: Facial movement is symmetric.  VIII: hearing is intact to voice X: Uvula elevates symmetrically XI: Shoulder shrug is symmetric. XII: tongue is midline without atrophy or fasciculations.  Motor: Tone is  normal. Bulk is normal. 5/5 strength was present in the left upper extremity and bilateral lower extremities, appears to be good in the right upper extremity as well, but unable to test due to cast Sensory: Sensation is symmetric to light touch and temperature in the arms and legs. Cerebellar: FNF intact on the left, difficult to test on the right due to cast, she does fairly well with the heel-knee-shin bilaterally.      I have reviewed labs in epic and the results pertinent to this consultation are: Elevated creatinine  I have reviewed the images obtained: CT head-tiny  cerebellar hemorrhage  Impression: 82 year old female with symptoms for one week and tiny cerebellar hemorrhage on CT. Given that she is a week out from onset of symptoms, I do not think she needs to be admitted to the intensive care unit. One possibility is that this actually represents ischemic infarct with tiny amount of hemorrhagic conversion. An MRI may be able to help answer this question. Though she does have a history of stroke, she was not taking aspirin due to previous problems with stomach issues.  Recommendations: 1) MRI brain 2) goal BP less than 160/90 3) no anticoagulants or antithrombotic at this time 4) neuro checks 5) PT, OT 6) TSH, B12 for memory loss  7) stroke team to follow   Roland Rack, MD [facility] [phone]  If 7pm- 7am, please page neurology on call as listed in [place].

## 2016-02-20 NOTE — Progress Notes (Signed)
Orthopedic Tech Progress Note Patient Details:  Meredith Bender 06-May-1926 [id number]  Ortho Devices Type of Ortho Device: Ace wrap, Post (long arm) splint, Arm sling Ortho Device/Splint Location: RUE Ortho Device/Splint Interventions: Ordered, Application   Braulio Bosch 02/20/2016, 5:52 PM

## 2016-02-20 NOTE — ED Triage Notes (Signed)
Pt here with daughter, was at [place] and when daughter turned around pt was on knees.  No chest pain, shortness of breath, dizziness or any other symptom noted.  Pt fell 4 nights ago coming out of bathroom for some unknown reason, injury to right elbow, bruise noted  To posterior forearm.

## 2016-02-21 ENCOUNTER — Inpatient Hospital Stay (HOSPITAL_COMMUNITY): Payer: Medicare Other

## 2016-02-21 DIAGNOSIS — R55 Syncope and collapse: Secondary | ICD-10-CM

## 2016-02-21 DIAGNOSIS — S52021A Displaced fracture of olecranon process without intraarticular extension of right ulna, initial encounter for closed fracture: Secondary | ICD-10-CM

## 2016-02-21 DIAGNOSIS — S52023A Displaced fracture of olecranon process without intraarticular extension of unspecified ulna, initial encounter for closed fracture: Secondary | ICD-10-CM

## 2016-02-21 DIAGNOSIS — I10 Essential (primary) hypertension: Secondary | ICD-10-CM

## 2016-02-21 DIAGNOSIS — I629 Nontraumatic intracranial hemorrhage, unspecified: Secondary | ICD-10-CM

## 2016-02-21 DIAGNOSIS — N184 Chronic kidney disease, stage 4 (severe): Secondary | ICD-10-CM

## 2016-02-21 LAB — TROPONIN I
TROPONIN I: 0.04 ng/mL — AB (ref <0.03)
Troponin I: 0.05 ng/mL (ref <0.03)
Troponin I: 0.03 ng/mL (ref <0.03)

## 2016-02-21 LAB — BASIC METABOLIC PANEL
ANION GAP: 8 (ref 5–15)
BUN: 30 mg/dL — ABNORMAL HIGH (ref 6–20)
CHLORIDE: 110 mmol/L (ref 101–111)
CO2: 22 mmol/L (ref 22–32)
Calcium: 8.9 mg/dL (ref 8.9–10.3)
Creatinine, Ser: 2.16 mg/dL — ABNORMAL HIGH (ref 0.44–1.00)
GFR calc non Af Amer: 19 mL/min — ABNORMAL LOW (ref >60)
GFR, EST AFRICAN AMERICAN: 22 mL/min — AB (ref >60)
GLUCOSE: 113 mg/dL — AB (ref 65–99)
Potassium: 4 mmol/L (ref 3.5–5.1)
Sodium: 140 mmol/L (ref 135–145)

## 2016-02-21 LAB — PROTIME-INR
INR: 1.05
Prothrombin Time: 13.7 seconds (ref 11.4–15.2)

## 2016-02-21 LAB — CBC
HCT: 37.9 % (ref 36.0–46.0)
HEMOGLOBIN: 12.2 g/dL (ref 12.0–15.0)
MCH: 28.4 pg (ref 26.0–34.0)
MCHC: 32.2 g/dL (ref 30.0–36.0)
MCV: 88.3 fL (ref 78.0–100.0)
Platelets: 237 10*3/uL (ref 150–400)
RBC: 4.29 MIL/uL (ref 3.87–5.11)
RDW: 14.3 % (ref 11.5–15.5)
WBC: 5.8 10*3/uL (ref 4.0–10.5)

## 2016-02-21 LAB — TSH: TSH: 2.67 u[IU]/mL (ref 0.350–4.500)

## 2016-02-21 LAB — LIPID PANEL
CHOL/HDL RATIO: 3 ratio
Cholesterol: 218 mg/dL — ABNORMAL HIGH (ref 0–200)
HDL: 73 mg/dL (ref >40)
LDL CALC: 123 mg/dL — AB (ref 0–99)
Triglycerides: 109 mg/dL (ref <150)
VLDL: 22 mg/dL (ref 0–40)

## 2016-02-21 LAB — VITAMIN B12: Vitamin B-12: 451 pg/mL (ref 180–914)

## 2016-02-21 LAB — SEDIMENTATION RATE: Sed Rate: 12 mm/hr (ref 0–22)

## 2016-02-21 LAB — APTT: APTT: 28 s (ref 24–36)

## 2016-02-21 MED ORDER — VERAPAMIL HCL ER 240 MG PO TBCR
240.0000 mg | EXTENDED_RELEASE_TABLET | Freq: Every day | ORAL | Status: DC
  Administered 2016-02-21 – 2016-02-25 (×6): 240 mg via ORAL
  Filled 2016-02-21 (×6): qty 1

## 2016-02-21 MED ORDER — HYDRALAZINE HCL 20 MG/ML IJ SOLN
5.0000 mg | Freq: Once | INTRAMUSCULAR | Status: AC
  Administered 2016-02-21: 5 mg via INTRAVENOUS
  Filled 2016-02-21: qty 1

## 2016-02-21 MED ORDER — VERAPAMIL HCL ER 240 MG PO CP24: 240.0000 mg | ORAL_CAPSULE | Freq: Every day | ORAL | Status: DC

## 2016-02-21 MED ORDER — HYDRALAZINE HCL 20 MG/ML IJ SOLN
5.0000 mg | INTRAMUSCULAR | Status: DC | PRN
  Administered 2016-02-21 – 2016-02-22 (×3): 5 mg via INTRAVENOUS
  Filled 2016-02-21 (×3): qty 1

## 2016-02-21 MED ORDER — HYDROCODONE-ACETAMINOPHEN 5-325 MG PO TABS
1.0000 | ORAL_TABLET | Freq: Once | ORAL | Status: AC
  Administered 2016-02-21: 1 via ORAL
  Filled 2016-02-21: qty 1

## 2016-02-21 NOTE — Progress Notes (Signed)
STROKE TEAM PROGRESS NOTE   HISTORY OF PRESENT ILLNESS (per record) Meredith Bender is a 82 y.o. female with a history of cerebellar infarcts who presents with unsteadiness for the past week. She had a fall on Tuesday and had to have her arm casted at that time. She apparently complained to her son either Monday or Sunday that she was having vertigo that was "not like her in her ear problems." Over the past week she has been having frequent falls and severe unsteadiness. Today, she fell while walking with her daughter in a parking lot and therefore was brought into the emergency room where a CT was obtained which did demonstrate a small cerebellar hemorrhage.  LKW: July 31 tpa given?: no, out of window, ICH  ICH 1   SUBJECTIVE (INTERVAL HISTORY) No acute events overnight    OBJECTIVE Temp:  [97.7 F (36.5 C)-98.3 F (36.8 C)] 98.3 F (36.8 C) (08/06 0838) Pulse Rate:  [67-94] 94 (08/06 1007) Resp:  [15-26] 18 (08/06 1007) BP: (150-225)/(64-140) 192/73 (08/06 1007) SpO2:  [92 %-100 %] 96 % (08/06 1007) Weight:  [47.2 kg (104 lb)-47.4 kg (104 lb 6.4 oz)] 47.2 kg (104 lb) (08/05 2048)  CBC:  Recent Labs Lab 02/20/16 1220 02/21/16 0801  WBC 6.3 5.8  HGB 11.7* 12.2  HCT 37.2 37.9  MCV 88.8 88.3  PLT 233 [id number]    Basic Metabolic Panel:  Recent Labs Lab 02/20/16 1220 02/21/16 0801  NA 137 140  K 4.5 4.0  CL 108 110  CO2 22 22  GLUCOSE 103* 113*  BUN 36* 30*  CREATININE 2.37* 2.16*  CALCIUM 8.8* 8.9    Lipid Panel:    Component Value Date/Time   CHOL 218 (H) 02/21/2016 0801   TRIG 109 02/21/2016 0801   HDL 73 02/21/2016 0801   CHOLHDL 3.0 02/21/2016 0801   VLDL 22 02/21/2016 0801   LDLCALC 123 (H) 02/21/2016 0801   HgbA1c:  Lab Results  Component Value Date   HGBA1C  11/11/2009    5.4 (NOTE)                                                                       According to the ADA Clinical Practice Recommendations for 2011, when HbA1c is used as a screening  test:   >=6.5%   Diagnostic of Diabetes Mellitus           (if abnormal result  is confirmed)  5.7-6.4%   Increased risk of developing Diabetes Mellitus  References:Diagnosis and Classification of Diabetes Mellitus,Diabetes [id number] 1):S62-S69 and Standards of Medical Care in         Diabetes - 2011,Diabetes Care,2011,34  (Suppl 1):S11-S61.   Urine Drug Screen: No results found for: LABOPIA, COCAINSCRNUR, LABBENZ, AMPHETMU, THCU, LABBARB    IMAGING  Dg Elbow Complete Right 02/20/2016 Acute fracture off the proximal aspect of the olecranon process.   Ct Head Wo Contrast 02/20/2016 1. 6 mm rounded region of high attenuation the right cerebellar hemisphere with surrounding low attenuation. This is concerning for a small hemorrhage with surrounding edema. The etiology is unclear. It would be an unusual site for trauma but a posttraumatic cause is not excluded. Hemorrhagic transformation of a small infarct is possible. Bleeding of  an underlying vascular abnormality or mass is not excluded. A hypertensive bleed could also occur in this location. An MRI could better assess this region.  2. Age indeterminate lacunar infarct of the right thalamus.    MRI brain without contrast 02/20/2016 1. 6 mm right cerebellar hemorrhage with mild surrounding edema, likely hypertensive in etiology. 2. Increased number of chronic microhemorrhages, consistent with chronic hypertension. 3. Progressive chronic small vessel ischemic disease with lacunar infarcts as above.     PHYSICAL EXAM  AAOx3 Pupils are equal and reactive to light, 61mm>2mm b/l EOMI Face symmetric  No language deficits Motor strength, 5/5 in all extremities. R arm is in a soft cast due to fracture, difficult to assess the R arm but has good grip strength and good antigravity Sensation intact throughout No cerebellar signs in all extremities but hard to assess the RUE.       ASSESSMENT/PLAN Ms. Meredith Bender is a 82 y.o.  female with history of a previous stroke, hypertension, dyslipidemia, COPD, chronic kidney disease, and coronary artery disease presenting with unsteady gait and multiple falls. She did not receive IV t-PA due to hemorrhage.  Stroke:  Right cerebellar hemorrhage felt secondary to hypertension.  Resultant - unsteadiness  MRI  6 mm right cerebellar hemorrhage with mild surrounding edema, likely hypertensive in etiology.  MRA - not performed  Carotid Doppler - not performed  2D Echo - EF 50-55%. No cardiac source of emboli identified.  LDL 123  HgbA1c - not performed  VTE prophylaxis - SCDs  Diet Heart Room service appropriate? Yes; Fluid consistency: Thin  No antithrombotic prior to admission, now on No antithrombotic secondary to hemorrhage  Ongoing aggressive stroke risk factor management  Therapy recommendations:  Pending  Disposition:  Pending  Hypertension  Occasional high blood pressures  Goal blood pressure less than 160/90 per Dr. Leonel Ramsay  Long-term BP goal normotensive  Hyperlipidemia  Home meds: No lipid lowering medications prior to admission  LDL 123, goal < 70  Consider Lipitor 40 mg daily  Order statin at time of discharge - contraindicated with acute intracranial hemorrhage     Other Stroke Risk Factors  Advanced age  Hx stroke/TIA    Other Active Problems  Chronic kidney disease - BUN 30 ; creatinine 2.16  Right elbow fracture  Mildly elevated troponin   Hospital day # 1  - SBP < 140 due to cerebellar ICH - Will follow up MRI brain - Hold any antiplatelet/AC for 10/14 days depending on MRI, if this was a hemorraghic conversion of stroke then can start the above sooner and liberalize the SBP to < 160    To contact Stroke Continuity provider, please refer to [email or web address] After hours, contact General Neurology

## 2016-02-21 NOTE — Progress Notes (Signed)
Family Medicine Teaching Service Daily Progress Note Intern Pager: [phone]  Patient name: Meredith Bender Medical record number: [id number] Date of birth: January 03, 1926 Age: 82 y.o. Gender: female  Primary Care Provider: [name], MD Consultants: Neurology Code Status: Partial (intubate)  Pt Overview and Major Events to Date:  8/5 - Pt admitted after ~ 1 week of unsteadiness and falls x 2  Assessment and Plan: Meredith Bender is a 82 y.o. female presenting with a left olecranon fracture s/p fall, found to have a possible small R cerebellar hemorrhage. PMH is significant for CKD 4, HLD, HTN, vulvar cancer (s/p radiation), cystitis, BPPV?, arthritic knee pain, previous hip fracture, and previous cerebellar infarcts.  Recent falls: CT concerning for cerebellar hemorrhage. Per radiologist, may be possible calcification (image 7, series 2) but because surrounded by edema cannot rule out mass or a vascular abnormality. Not on aspirin due to stomach issues. Denies hitting head, loss of consciousness, sensation of warmth or visual changes. No focal weakness or loss of sensation. Will pursue further work-up. ABCVD score 4 - moderate risk. Increased concern for CVA given uncontrolled BP on admission. No leukocytosis or fevers. Initial EKG with new T-wave inversions in V5 and V6 but less flattening compared to last EKG in 2015.  - Admitted to [facility], attending Dr. Ree Kida - Ordered MRI brain wo contrast (low GFR) - Radiologist Dr. Jimmye Norman recommended MRI w/wo to follow-up but warned negative MRI at this time may not rule out mass at this time if obscured by blood. - Neurology consulted, appreciate recommendations: keep BP </= 160/90; no anticoagulants or antithrombotics - q4h neuro checks while awake - PT, OT consulted - UA with trace leukocytes and 6-30 WBC on add-on; Urine culture pending - Will obtain risk start labs with am labs (lipids, PT, PTT, ESR) - TSH, B12 for concerns of memory loss - Repeat  EKG this a.m. - Obtain troponin --> first troponin 0.05 in setting of CKDIV. Continue to trend.  HTN: Needed prn hydralazine once overnight for BP of 185/69. Hypertensive to 225/140 on admission in setting of not taking am medications. Question of whether patient is taking verapamil. Patient and daughter say she has been, but pharmacist did not think she was taking based on her review. Pt clarified this am she had not missed any doses but ran out of verapamil on day of presentation and had been planning to get refill at pharmacy 8/5.  - Continue home hydralazine 25 mg TID - Ordered verapamil 240 mg, as patient had good response in ED - PRN hydralazine 5 mg IV q2h to keep BP </= 160/90  - q2h BP checks  Olecranon fracture: Fracture of proximal aspect noted on x-ray 8/5. S/p splint and sling placement - Continue home tramadol 50 mg q6h prn - Tylenol prn - gave 1 time dose of norco 8/6.  - f/u with ortho outpatient  CKD-IV: BL SCr ~ 2.2. Was 2.37 on admission.  - Monitor renal function --> Improved at 2.16 on 8/6 - Avoid nephrotoxic medications  FEN/GI: Heart Healthy Diet, prilosec Prophylaxis: SCDs  Disposition: Admit for further imaging and monitoring of BP.   Subjective:  Pt feels well this morning and eager to go home when able. Denies HA, nausea, dizziness and CP. Did not have much of an appetite this morning but does not like eggs. Complains of 6/10 pain in her R elbow and asking for something more for pain.   Objective: Temp:  [97.7 F (36.5 C)-98.3 F (36.8 C)]  98.3 F (36.8 C) (08/06 0838) Pulse Rate:  [67-94] 90 (08/06 1059) Resp:  [15-26] 18 (08/06 1007) BP: (150-225)/(64-140) 161/86 (08/06 1059) SpO2:  [92 %-100 %] 96 % (08/06 1007) Weight:  [104 lb (47.2 kg)-104 lb 6.4 oz (47.4 kg)] 104 lb (47.2 kg) (08/05 2048) Physical Exam: General: Well-appearing, older female in NAD, sitting up in bed Eyes: PERRLA, no nystagmus, EOMI Cardiovascular: RRR, S1, S2, no  m/r/g Respiratory: CTAB, no increased WOB Abdomen: +BS, S, NT, ND MSK: Normal tone, gait normal. Right arm immobilized in sling and splint.  Neuro: CNII-XII intact, no focal deficits. AOx3. Finger-to-nose testing on L intact. Heel-to-shin testing normal bilaterally.  Psych: Normal mood and affect.   Laboratory:  Recent Labs Lab 02/20/16 1220 02/21/16 0801  WBC 6.3 5.8  HGB 11.7* 12.2  HCT 37.2 37.9  PLT 233 237    Recent Labs Lab 02/20/16 1220 02/21/16 0801  NA 137 140  K 4.5 4.0  CL 108 110  CO2 22 22  BUN 36* 30*  CREATININE 2.37* 2.16*  CALCIUM 8.8* 8.9  GLUCOSE 103* 113*   Imaging/Diagnostic Tests: Dg Elbow Complete Right  Result Date: 02/20/2016 CLINICAL DATA:  Pain after fall EXAM: RIGHT ELBOW - COMPLETE 3+ VIEW COMPARISON:  None. FINDINGS: There is a calcification just proximal to the olecranon process with acute angles, favored to represent an acute fracture off the olecranon process. No other bony or soft tissue abnormalities are identified. IMPRESSION: Acute fracture off the proximal aspect of the olecranon process. Electronically Signed   By: Dorise Bullion III M.D   On: 02/20/2016 15:44   Ct Head Wo Contrast  Result Date: 02/20/2016 CLINICAL DATA:  Status post fall. EXAM: CT HEAD WITHOUT CONTRAST TECHNIQUE: Contiguous axial images were obtained from the base of the skull through the vertex without intravenous contrast. COMPARISON:  None. FINDINGS: Paranasal sinuses, mastoid air cells, bones, and extracranial soft tissues are within normal limits. No subdural, epidural, or subarachnoid hemorrhage. There is a small rounded region of high attenuation in the right cerebellar hemisphere on series 2, image 7 measuring 6 mm with a halo of surrounding low attenuation. Remainder of the cerebellum is normal. The basal cisterns are patent. Moderate to severe white matter changes are noted. A lacunar infarct in the right thalamus is age indeterminate. No other evidence of acute  ischemia or infarct. No midline shift. Ventricles and sulci are normal for age. IMPRESSION: 1. 6 mm rounded region of high attenuation the right cerebellar hemisphere with surrounding low attenuation. This is concerning for a small hemorrhage with surrounding edema. The etiology is unclear. It would be an unusual site for trauma but a posttraumatic cause is not excluded. Hemorrhagic transformation of a small infarct is possible. Bleeding of an underlying vascular abnormality or mass is not excluded. A hypertensive bleed could also occur in this location. An MRI could better assess this region. 2. Age indeterminate lacunar infarct of the right thalamus. Findings called to Dr. Eulis Foster. Electronically Signed   By: Dorise Bullion III M.D   On: 02/20/2016 17:22    Damontre Millea Corinda Gubler, MD 02/21/2016, 11:30 AM PGY-2, [facility] Intern pager: [phone], text pages welcome

## 2016-02-21 NOTE — Progress Notes (Signed)
PT Cancellation Note  Patient Details Name: Meredith Bender MRN: [id number] DOB: [birth year]   Cancelled Treatment:    Reason Eval/Treat Not Completed: Medical issues which prohibited therapy  Noted elevated troponin levels; will hold on PT today. Will need to see a decrease trend in troponin levels in order to proceed with PT eval. Will check in tomorrow.  Chaney Malling, SPT Acute Rehabilitation Services  Chaney Malling 02/21/2016, 11:06 AM

## 2016-02-21 NOTE — Progress Notes (Signed)
CRITICAL VALUE ALERT  Critical value received:  Troponin 0.05  Date of notification:  02/21/2016  Time of notification:  1035  Critical value read back:Yes.    Nurse who received alert:  Haywood Lasso  MD notified (1st page):  Fredonia Highland  Time of first page:  1035  MD notified (2nd page):  Time of second page:  Responding MD:    Time MD responded:

## 2016-02-21 NOTE — Progress Notes (Signed)
OT Cancellation Note  Patient Details Name: Meredith Bender MRN: [id number] DOB: 23-May-1926   Cancelled Treatment:    Reason Eval/Treat Not Completed: Patient at procedure or test/ unavailable (EKG and then to MRI) Transport present to take patient off unit  Vonita Moss   OTR/L Pager: [phone] Office: [phone] .  02/21/2016, 11:08 AM

## 2016-02-21 NOTE — Progress Notes (Signed)
Dr Ola Spurr called back regarding critical troponin. New orders received. 12 lead EKG now. Pt going to MRI.

## 2016-02-22 DIAGNOSIS — I614 Nontraumatic intracerebral hemorrhage in cerebellum: Secondary | ICD-10-CM

## 2016-02-22 DIAGNOSIS — G9340 Encephalopathy, unspecified: Secondary | ICD-10-CM | POA: Insufficient documentation

## 2016-02-22 DIAGNOSIS — N289 Disorder of kidney and ureter, unspecified: Secondary | ICD-10-CM

## 2016-02-22 LAB — CBC
HEMATOCRIT: 36 % (ref 36.0–46.0)
HEMOGLOBIN: 11.2 g/dL — AB (ref 12.0–15.0)
MCH: 27.7 pg (ref 26.0–34.0)
MCHC: 31.1 g/dL (ref 30.0–36.0)
MCV: 88.9 fL (ref 78.0–100.0)
Platelets: 227 10*3/uL (ref 150–400)
RBC: 4.05 MIL/uL (ref 3.87–5.11)
RDW: 14.5 % (ref 11.5–15.5)
WBC: 5.6 10*3/uL (ref 4.0–10.5)

## 2016-02-22 LAB — BASIC METABOLIC PANEL
Anion gap: 7 (ref 5–15)
BUN: 30 mg/dL — AB (ref 6–20)
CALCIUM: 8.8 mg/dL — AB (ref 8.9–10.3)
CHLORIDE: 111 mmol/L (ref 101–111)
CO2: 23 mmol/L (ref 22–32)
CREATININE: 2.38 mg/dL — AB (ref 0.44–1.00)
GFR calc non Af Amer: 17 mL/min — ABNORMAL LOW (ref >60)
GFR, EST AFRICAN AMERICAN: 20 mL/min — AB (ref >60)
GLUCOSE: 95 mg/dL (ref 65–99)
Potassium: 4.2 mmol/L (ref 3.5–5.1)
Sodium: 141 mmol/L (ref 135–145)

## 2016-02-22 LAB — URINE CULTURE

## 2016-02-22 LAB — TROPONIN I: Troponin I: 0.04 ng/mL (ref <0.03)

## 2016-02-22 MED ORDER — HYDRALAZINE HCL 20 MG/ML IJ SOLN
5.0000 mg | Freq: Once | INTRAMUSCULAR | Status: AC
  Administered 2016-02-22: 5 mg via INTRAVENOUS
  Filled 2016-02-22: qty 1

## 2016-02-22 MED ORDER — HYDRALAZINE HCL 25 MG PO TABS
25.0000 mg | ORAL_TABLET | Freq: Four times a day (QID) | ORAL | Status: DC
  Administered 2016-02-22 – 2016-02-23 (×5): 25 mg via ORAL
  Filled 2016-02-22 (×5): qty 1

## 2016-02-22 MED ORDER — HYDRALAZINE HCL 20 MG/ML IJ SOLN: 5.0000 mg | INTRAMUSCULAR | Status: DC | PRN

## 2016-02-22 MED ORDER — HYDRALAZINE HCL 20 MG/ML IJ SOLN
5.0000 mg | INTRAMUSCULAR | Status: DC | PRN
  Administered 2016-02-23 – 2016-02-25 (×5): 5 mg via INTRAVENOUS
  Filled 2016-02-22 (×5): qty 1

## 2016-02-22 NOTE — Evaluation (Signed)
Physical Therapy Evaluation Patient Details Name: Meredith Bender MRN: [id number] DOB: May 06, 1926 Today's Date: 02/22/2016   History of Present Illness  Meredith Bender is an 82 y.o. female presenting with unsteadiness, a left olecranon fracture s/p fall, and a small R cerebellar hemorrhage. PMH is significant for CKD 4, HLD, HTN, vulvar cancer (s/p radiation), cystitis, BPPV?, arthritic knee pain, previous hip fracture, and previous cerebellar infarcts.    Clinical Impression  Pt admitted with above diagnosis. Pt currently with functional limitations due to the deficits listed below (see PT Problem List). Ms. Spindle ambulated through the hallway during this session; she demonstrated some unsteadiness and significantly decreased gait speed. Ms. Ficke required single arm support from PT while ambulating, primarily for comfort rather than balance. Her BP remained consistent throughout therapy session (117-121/40-50). Pt will benefit from skilled PT to increase their independence and safety with mobility to allow discharge to the venue listed below.      Follow Up Recommendations SNF    Equipment Recommendations  Other (comment) (Unilateral device: cane/quadcane/hemiwalker)    Recommendations for Other Services OT consult     Precautions / Restrictions Precautions Precautions: Fall Restrictions Weight Bearing Restrictions: Yes      Mobility  Bed Mobility Overal bed mobility: Needs Assistance Bed Mobility: Rolling;Sidelying to Sit Rolling: Supervision (Required use of bed rail to roll) Sidelying to sit: Supervision (Required use of bed rail to prop up to sitting)       General bed mobility comments: Cued pt to roll and sit up on left side of bed to avoid any weight bearing through RUE; reached for bed rails without any cues from PT, used them correctly.  Transfers Overall transfer level: Needs assistance Equipment used: None Transfers: Sit to/from Stand Sit to Stand: Min guard          General transfer comment: Pt able to power-up using LEs and LUE; cues for hand placement when sitting down in chair.  Ambulation/Gait Ambulation/Gait assistance: Min guard Ambulation Distance (Feet): 100 Feet Assistive device: None Gait Pattern/deviations: Decreased step length - right;Decreased step length - left;Decreased dorsiflexion - right;Decreased dorsiflexion - left;Shuffle;Trunk flexed;Narrow base of support   Gait velocity interpretation: Below normal speed for age/gender General Gait Details: Pt used therapist in place of cane; pt did not depend on PT for balance, but rather for comfort as she stated she has become nervous due to recent falls.  Stairs            Wheelchair Mobility    Modified Rankin (Stroke Patients Only)       Balance Overall balance assessment: Needs assistance Sitting-balance support: No upper extremity supported Sitting balance-Leahy Scale: Good     Standing balance support: No upper extremity supported Standing balance-Leahy Scale: Fair                               Pertinent Vitals/Pain Pain Assessment: No/denies pain    Home Living   Living Arrangements: Alone Available Help at Discharge: Family Type of Home: House Home Access: Stairs to enter Entrance Stairs-Rails:  (Needs clarification) Entrance Stairs-Number of Steps: 2 Home Layout: One level Home Equipment: Cane - single point Additional Comments: Reports she does not use cane to ambulate around the house.    Prior Function Level of Independence: Independent with assistive device(s)               Hand Dominance  Extremity/Trunk Assessment   Upper Extremity Assessment: RUE deficits/detail;Generalized weakness RUE Deficits / Details: Fx R elbow; wears a sling during activity RUE: Unable to fully assess due to immobilization       Lower Extremity Assessment: Generalized weakness      Cervical / Trunk Assessment: Kyphotic   Communication   Communication: No difficulties  Cognition Arousal/Alertness: Awake/alert Behavior During Therapy: WFL for tasks assessed/performed Overall Cognitive Status: Within Functional Limits for tasks assessed                      General Comments General comments (skin integrity, edema, etc.): Noted troponin value less than yesterday, but without a definitive trend downward; Discussed with MD, who okayed mobility; BP monitored throughout session: 121/49 (supine); 117/50 (sitting); 121/49 (ambulating). Pt mentioned pain in her hip due to a previous fx.    Exercises        Assessment/Plan    PT Assessment Patient needs continued PT services  PT Diagnosis Generalized weakness;Acute pain   PT Problem List Decreased strength;Decreased activity tolerance;Decreased balance;Decreased mobility;Decreased knowledge of use of DME;Pain  PT Treatment Interventions Functional mobility training;DME instruction;Gait training;Stair training;Therapeutic activities;Therapeutic exercise;Balance training;Patient/family education   PT Goals (Current goals can be found in the Care Plan section) Acute Rehab PT Goals Patient Stated Goal: Did not sate PT Goal Formulation: With patient Time For Goal Achievement: 03/07/16 Potential to Achieve Goals: Good    Frequency Min 3X/week   Barriers to discharge        Co-evaluation               End of Session Equipment Utilized During Treatment: Gait belt Activity Tolerance: Patient tolerated treatment well Patient left: in chair;with call bell/phone within reach;with nursing/sitter in room Nurse Communication: Mobility status         Time: [id number] PT Time Calculation (min) (ACUTE ONLY): 28 min   Charges:    02/22/16 1400  PT General Charges  $$ ACUTE PT VISIT 1 Procedure  PT Evaluation  $PT Eval Moderate Complexity 1 Procedure  PT Treatments  $Gait Training 8-22 mins          PT G Codes:       Chaney Malling,  SPT Acute Rehabilitation Services  Chaney Malling 02/22/2016, 3:29 PM    I was present during the PT session and agree with patient status and findings as outlined by Sharyn Lull, SPT.  Meredith Bender, [place]  Acute Rehabilitation Services Pager [phone] Office [phone]

## 2016-02-22 NOTE — Progress Notes (Signed)
Called by nursing around 5 AM for elevated blood pressures despite PRN Hydralazine giv/en q 2 hours. Reports patient is not in pain. BP 184/79, then 182/66.  Decided to administer 5mg  IV Hydralazine about an hour after last dose.   Repeat blood pressure continues to be elevated at 188/78. Nursing reports patient is sleeping and is not in pain. Reports using appropriate size cuff. Decided to obtain manual BP reading, which was 186/80.   Will give AM BP meds early (Hydralazine PO and Verapamil). Will likely need to increase PO Hydralazine dose or frequency. Will discuss with AM team.

## 2016-02-22 NOTE — Care Management Important Message (Signed)
Important Message  Patient Details  Name: Meredith Bender MRN: [id number] Date of Birth: [birth year]   Medicare Important Message Given:  Yes    Loann Quill 02/22/2016, 2:01 PM

## 2016-02-22 NOTE — Evaluation (Signed)
Occupational Therapy Evaluation Patient Details Name: Meredith Bender MRN: [id number] DOB: Dec 04, 1925 Today's Date: 02/22/2016    History of Present Illness Meredith Bender is an 82 y.o. female presenting with unsteadiness, a left olecranon fracture s/p fall, and a small R cerebellar hemorrhage. PMH is significant for CKD 4, HLD, HTN, vulvar cancer (s/p radiation), cystitis, BPPV?, arthritic knee pain, previous hip fracture, and previous cerebellar infarcts.   Clinical Impression   Patient presenting with decreased ADL and functional mobility independence secondary to above. Patient independent PTA. Patient currently functioning at an overall min to max assist level for functional mobility and ADL. Patient will benefit from acute OT to increase overall independence in the areas of ADLs, functional mobility, and overall safety in order to safely discharge to venue listed below.     Follow Up Recommendations  SNF;Supervision/Assistance - 24 hour    Equipment Recommendations  Other (comment) (TBD next venue of care)    Recommendations for Other Services  None at this time   Precautions / Restrictions Precautions Precautions: Fall Required Braces or Orthoses: Other Brace/Splint;Sling (splint and sling to RUE) Restrictions Weight Bearing Restrictions:  (nothing specified in chart)    Mobility Bed Mobility Overal bed mobility: Needs Assistance Bed Mobility: Rolling;Sidelying to Sit Rolling: Supervision (Required use of bed rail to roll) Sidelying to sit: Supervision (Required use of bed rail to prop up to sitting)       General bed mobility comments: Cued pt to roll and sit up on left side of bed to avoid any weight bearing through RUE; reached for bed rails without any cues from PT, used them correctly.  Transfers Overall transfer level: Needs assistance Equipment used: None Transfers: Sit to/from Stand Sit to Stand: Min guard         General transfer comment: Pt able to  power-up using LEs and LUE; cues for hand placement when sitting down in chair.    Balance Overall balance assessment: Needs assistance;History of Falls Sitting-balance support: No upper extremity supported Sitting balance-Leahy Scale: Good     Standing balance support: No upper extremity supported Standing balance-Leahy Scale: Fair Standing balance comment: Pt able to stand at sink with no UE support during grooming tasks with mild instability, but no LOB    ADL Overall ADL's : Needs assistance/impaired Eating/Feeding: Set up;Sitting   Grooming: Min guard;Standing   Upper Body Bathing: Minimal assitance   Lower Body Bathing: Moderate assistance;Sit to/from stand   Upper Body Dressing : Moderate assistance;Sitting   Lower Body Dressing: Maximal assistance;Sit to/from stand   Toilet Transfer: Minimal assistance;Comfort height toilet;Ambulation   Toileting- Clothing Manipulation and Hygiene: Minimal assistance;Sit to/from stand         General ADL Comments: Pt limited due to immobilization of RUE.     Pertinent Vitals/Pain Pain Assessment: Faces Faces Pain Scale: Hurts little more Pain Location: RUE Pain Descriptors / Indicators: Aching;Throbbing Pain Intervention(s): Monitored during session;Repositioned;RN gave pain meds during session     Hand Dominance Right   Extremity/Trunk Assessment Upper Extremity Assessment Upper Extremity Assessment: RUE deficits/detail RUE Deficits / Details: Fx R elbow; wears a sling during activity RUE: Unable to fully assess due to immobilization   Lower Extremity Assessment Lower Extremity Assessment: Generalized weakness   Cervical / Trunk Assessment Cervical / Trunk Assessment: Kyphotic   Communication Communication Communication: No difficulties   Cognition Arousal/Alertness: Awake/alert Behavior During Therapy: WFL for tasks assessed/performed Overall Cognitive Status: Within Functional Limits for tasks assessed  Home Living Family/patient expects to be discharged to:: Private residence Living Arrangements: Alone Available Help at Discharge: Family;Available PRN/intermittently Type of Home: House Home Access: Stairs to enter [employer] of Steps: 2 Entrance Stairs-Rails:  (Needs clarification) Home Layout: One level     Bathroom Shower/Tub: Tub/shower unit;Curtain   Biochemist, clinical: Standard     Home Equipment: Cane - single point;Shower seat   Additional Comments: Reports she does not use cane to ambulate around the house.      Prior Functioning/Environment Level of Independence: Independent with assistive device(s)     OT Diagnosis: Generalized weakness;Acute pain   OT Problem List: Decreased strength;Decreased range of motion;Decreased activity tolerance;Impaired balance (sitting and/or standing);Decreased safety awareness;Decreased knowledge of use of DME or AE;Decreased knowledge of precautions   OT Treatment/Interventions: Self-care/ADL training;Therapeutic exercise;Energy conservation;DME and/or AE instruction;Therapeutic activities;Patient/family education;Balance training    OT Goals(Current goals can be found in the care plan section) Acute Rehab OT Goals Patient Stated Goal: Did not state OT Goal Formulation: With patient Time For Goal Achievement: 03/07/16 Potential to Achieve Goals: Good ADL Goals Pt Will Perform Grooming: with supervision;standing Pt Will Perform Lower Body Bathing: with supervision;with adaptive equipment;sit to/from stand Pt Will Perform Lower Body Dressing: with supervision;with adaptive equipment;sit to/from stand Pt Will Transfer to Toilet: with supervision;ambulating;bedside commode Additional ADL Goal #1: Pt will be supervision with functional ambulation/mobility during ADL using LRAD   OT Frequency: Min 2X/week   Barriers to D/C: Decreased caregiver support   End of Session Equipment Utilized During Treatment: Other  (comment) (sling RUE) Nurse Communication: Mobility status  Activity Tolerance: Patient tolerated treatment well Patient left: in chair;with call bell/phone within reach   Time: [id number] OT Time Calculation (min): 23 min Charges:  OT General Charges $OT Visit: 1 Procedure OT Evaluation $OT Eval Moderate Complexity: 1 Procedure OT Treatments $Self Care/Home Management : 8-22 mins  Chrys Racer , MS, OTR/L, CLT Pager: [phone]  02/22/2016, 4:28 PM

## 2016-02-22 NOTE — Progress Notes (Signed)
STROKE TEAM PROGRESS NOTE   HISTORY OF PRESENT ILLNESS (per record) Quinnetta L Yost is a 82 y.o. female with a history of cerebellar infarcts who presents with unsteadiness for the past week. She had a fall on Tuesday and had to have her arm casted at that time. She apparently complained to her son either Monday or Sunday that she was having vertigo that was "not like her in her ear problems." Over the past week she has been having frequent falls and severe unsteadiness. Today, she fell while walking with her daughter in a parking lot and therefore was brought into the emergency room where a CT was obtained which did demonstrate a small cerebellar hemorrhage.  LKW: July 31 tpa given?: no, out of window, ICH  ICH 1   SUBJECTIVE (INTERVAL HISTORY) No acute events overnight Patient has a 10-15% risk of having another stroke over the next year, the highest risk is within 2 weeks of the most recent stroke/TIA (risk of having a stroke following a stroke or TIA is the same) and about half of that risk is in the first 6 months. From the second year onwards risk is 4-5% per year each year for the rest of the patient's life  neurologically stable. No complaints. Son states that patient's blood pressure has been very difficult to control in the past   OBJECTIVE Temp:  [97.4 F (36.3 C)-98.4 F (36.9 C)] 97.8 F (36.6 C) (08/07 1607) Pulse Rate:  [68-99] 99 (08/07 1607) Resp:  [17-20] 20 (08/07 1607) BP: (121-188)/(48-81) 126/51 (08/07 1607) SpO2:  [94 %-100 %] 96 % (08/07 1607) Weight:  [108 lb 3.9 oz (49.1 kg)] 108 lb 3.9 oz (49.1 kg) (08/06 2010)  CBC:   Recent Labs Lab 02/21/16 0801 02/22/16 0455  WBC 5.8 5.6  HGB 12.2 11.2*  HCT 37.9 36.0  MCV 88.3 88.9  PLT 237 [id number]    Basic Metabolic Panel:   Recent Labs Lab 02/21/16 0801 02/22/16 0455  NA 140 141  K 4.0 4.2  CL 110 111  CO2 22 23  GLUCOSE 113* 95  BUN 30* 30*  CREATININE 2.16* 2.38*  CALCIUM 8.9 8.8*    Lipid Panel:      Component Value Date/Time   CHOL 218 (H) 02/21/2016 0801   TRIG 109 02/21/2016 0801   HDL 73 02/21/2016 0801   CHOLHDL 3.0 02/21/2016 0801   VLDL 22 02/21/2016 0801   LDLCALC 123 (H) 02/21/2016 0801   HgbA1c:  Lab Results  Component Value Date   HGBA1C  11/11/2009    5.4 (NOTE)                                                                       According to the ADA Clinical Practice Recommendations for 2011, when HbA1c is used as a screening test:   >=6.5%   Diagnostic of Diabetes Mellitus           (if abnormal result  is confirmed)  5.7-6.4%   Increased risk of developing Diabetes Mellitus  References:Diagnosis and Classification of Diabetes Mellitus,Diabetes [id number] 1):S62-S69 and Standards of Medical Care in         Diabetes - 2011,Diabetes Care,2011,34  (Suppl 1):S11-S61.   Urine Drug Screen: No results  found for: LABOPIA, COCAINSCRNUR, LABBENZ, AMPHETMU, THCU, LABBARB    IMAGING  Dg Elbow Complete Right 02/20/2016 Acute fracture off the proximal aspect of the olecranon process.   Ct Head Wo Contrast 02/20/2016 1. 6 mm rounded region of high attenuation the right cerebellar hemisphere with surrounding low attenuation. This is concerning for a small hemorrhage with surrounding edema. The etiology is unclear. It would be an unusual site for trauma but a posttraumatic cause is not excluded. Hemorrhagic transformation of a small infarct is possible. Bleeding of an underlying vascular abnormality or mass is not excluded. A hypertensive bleed could also occur in this location. An MRI could better assess this region.  2. Age indeterminate lacunar infarct of the right thalamus.    MRI brain without contrast 02/20/2016 1. 6 mm right cerebellar hemorrhage with mild surrounding edema, likely hypertensive in etiology. 2. Increased number of chronic microhemorrhages, consistent with chronic hypertension. 3. Progressive chronic small vessel ischemic disease with lacunar  infarcts as above.     PHYSICAL EXAM  AAOx3 Pupils are equal and reactive to light, 72mm>2mm b/l EOMI Face symmetric  No language deficits Motor strength, 5/5 in all extremities. R arm is in a soft cast due to fracture, difficult to assess the R arm but has good grip strength and good antigravity Sensation intact throughout No cerebellar signs in all extremities but hard to assess the RUE.       ASSESSMENT/PLAN Ms. Cherlynn L Abbs is a 82 y.o. female with history of a previous stroke, hypertension, dyslipidemia, COPD, chronic kidney disease, and coronary artery disease presenting with unsteady gait and multiple falls. She did not receive IV t-PA due to hemorrhage.  Stroke:  Right cerebellar hemorrhage felt secondary to hypertension.  Resultant - unsteadiness  MRI  6 mm right cerebellar hemorrhage with mild surrounding edema, likely hypertensive in etiology.  MRA - not performed  Carotid Doppler - not performed  2D Echo - EF 50-55%. No cardiac source of emboli identified.  LDL 123  HgbA1c - not performed  VTE prophylaxis - SCDs Diet Heart Room service appropriate? Yes; Fluid consistency: Thin  No antithrombotic prior to admission, now on No antithrombotic secondary to hemorrhage  Ongoing aggressive stroke risk factor management  Therapy recommendations:  Pending  Disposition:  Pending  Hypertension  Occasional high blood pressures  Goal blood pressure less than 160/90 per Dr. Leonel Ramsay  Long-term BP goal normotensive  Hyperlipidemia  Home meds: No lipid lowering medications prior to admission  LDL 123, goal < 70  Consider Lipitor 40 mg daily  Order statin at time of discharge - contraindicated with acute intracranial hemorrhage     Other Stroke Risk Factors  Advanced age  Hx stroke/TIA    Other Active Problems  Chronic kidney disease - BUN 30 ; creatinine 2.16  Right elbow fracture  Mildly elevated troponin Stroke team will sign off.  Kindly call for questions.   Hospital day # 2  - SBP < 180 due to cerebellar ICH - Mobilize out of bed. Therapy consults - Hold any antiplatelet/AC for 7  days   Long discussion with patient and son at the bedside and answered questions. Greater than 50% time during this 25 minute visit was spent on counseling and cognition of care about stroke risk and prevention.  Stroke team will sign off. Call for questions. I have personally examined this patient, reviewed notes, independently viewed imaging studies, participated in medical decision making and plan of care. I have made any additions or  clarifications directly to the above note.   Antony Contras, MD Medical Director [facility] Pager: [phone] 02/22/2016 4:42 PM    To contact Stroke Continuity provider, please refer to [email or web address] After hours, contact General Neurology

## 2016-02-22 NOTE — Progress Notes (Signed)
Family Medicine Teaching Service Daily Progress Note Intern Pager: [phone]  Patient name: Meredith Bender Medical record number: [id number] Date of birth: September 28, 1925 Age: 82 y.o. Gender: female  Primary Care Provider: [name], MD Consultants: Neurology Code Status: Partial (intubate)  Pt Overview and Major Events to Date:  8/5 - Pt admitted after ~ 1 week of unsteadiness and falls x 2  Assessment and Plan: Meredith Bender is a 82 y.o. female  with hx of previous cerebellar infarcts, CKD 4, HLD, HTN, vulvar cancer (s/p radiation), cystitis, BPPV?, arthritic knee pain, and previous hip fracture.  Recent falls w/R cerebellar hemorrhage MRI brain 8/6 wo contrast (low GFR) 64m right cerebellar hemorrhage with mild surrounding edema, likely hypertensive in etiology, increased number of chronic microhemorrhages, consistent with chronic HTN, progressive chronic small vessel dx with lacunar infarcts. No focal weakness or loss of sensation. No leukocytosis or fevers. - TSH normal, B12 for concerns of memory loss, Vit B12 level 451 normal - Neurology consulted, appreciate recommendations: keep BP </= 160/90; no anticoagulants or antithrombotics - q4h neuro checks while awake - PT, OT consulted  - UA with trace leukocytes and 6-30 WBC on add-on; Urine culture pending - Will obtain risk start labs with am labs (lipids show high cholesterol, LDL 123 rest wnl, Normal PT, PTT, ESR) - Troponin --> first troponin 0.05 in setting of CKDIV, 0.03-0.04, have remained stable  HTN:. Hypertensive to 225/140 on admission in setting of not taking am medications. -Required PRN Hydralazine this morning due to elevated bp's despite hydralazine given q2hours. - Continue home hydralazine 25 mg TID, changed to 4x daily, continue to monitor BPs throughout day - Verapamil 240 mg, as patient had good response in ED - PRN hydralazine 5 mg IV q2h to keep BP </= 160/90  - q2h BP checks -BP continues around high  180s/80 -Will touch base with Neuro to clarify BP goals: <140 vs <160 sbp  Olecranon fracture: Fracture of proximal aspect noted on x-ray 8/5. S/p splint and sling placement - Continue home tramadol 50 mg q6h prn - Tylenol prn - gave 1 time dose of norco 8/6.  - f/u with ortho outpatient  CKD-IV: BL SCr ~ 2.2. Was 2.37 on admission.  - Monitor renal function --> Improved at 2.16 on 8/6 -2.38 today - Avoid nephrotoxic medications  FEN/GI: Heart Healthy Diet, prilosec Prophylaxis: SCDs  Disposition: Admit for further imaging and monitoring of BP.   Subjective:  Pt feels well this morning and eager to go home when able. Denies HA, nausea, dizziness and CP. Did not have much of an appetite this morning but does not like eggs. Complains of 6/10 pain in her R elbow and asking for something more for pain.   Objective: Temp:  [97.9 F (36.6 C)-98.3 F (36.8 C)] 98 F (36.7 C) (08/07 0409) Pulse Rate:  [68-94] 86 (08/07 0606) Resp:  [16-19] 18 (08/07 0606) BP: (116-192)/(45-86) 186/80 (08/07 0622) SpO2:  [94 %-100 %] 95 % (08/07 0606) Weight:  [108 lb 3.9 oz (49.1 kg)] 108 lb 3.9 oz (49.1 kg) (08/06 2010)    Physical Exam: General: Well-appearing, older female in NAD, sitting up in bed Eyes: PERRLA, no nystagmus, EOMI Cardiovascular: RRR, S1, S2, no m/r/g Respiratory: CTAB, no increased WOB Abdomen: +BS, S, NT, ND MSK: Normal tone, gait normal. Right arm immobilized in sling and splint.  Neuro: CNII-XII intact, no focal deficits. AOx3. Finger-to-nose testing on L intact. Heel-to-shin testing normal bilaterally.  Psych: Normal mood and affect.  Laboratory:  Recent Labs Lab 02/20/16 1220 02/21/16 0801 02/22/16 0455  WBC 6.3 5.8 5.6  HGB 11.7* 12.2 11.2*  HCT 37.2 37.9 36.0  PLT 233 237 227    Recent Labs Lab 02/20/16 1220 02/21/16 0801 02/22/16 0455  NA 137 140 141  K 4.5 4.0 4.2  CL 108 110 111  CO2 22 22 23   BUN 36* 30* 30*  CREATININE 2.37* 2.16* 2.38*   CALCIUM 8.8* 8.9 8.8*  GLUCOSE 103* 113* 95   Imaging/Diagnostic Tests: Dg Elbow Complete Right  Result Date: 02/20/2016 CLINICAL DATA:  Pain after fall EXAM: RIGHT ELBOW - COMPLETE 3+ VIEW COMPARISON:  None. FINDINGS: There is a calcification just proximal to the olecranon process with acute angles, favored to represent an acute fracture off the olecranon process. No other bony or soft tissue abnormalities are identified. IMPRESSION: Acute fracture off the proximal aspect of the olecranon process. Electronically Signed   By: Dorise Bullion III M.D   On: 02/20/2016 15:44   Ct Head Wo Contrast  Result Date: 02/20/2016 CLINICAL DATA:  Status post fall. EXAM: CT HEAD WITHOUT CONTRAST TECHNIQUE: Contiguous axial images were obtained from the base of the skull through the vertex without intravenous contrast. COMPARISON:  None. FINDINGS: Paranasal sinuses, mastoid air cells, bones, and extracranial soft tissues are within normal limits. No subdural, epidural, or subarachnoid hemorrhage. There is a small rounded region of high attenuation in the right cerebellar hemisphere on series 2, image 7 measuring 6 mm with a halo of surrounding low attenuation. Remainder of the cerebellum is normal. The basal cisterns are patent. Moderate to severe white matter changes are noted. A lacunar infarct in the right thalamus is age indeterminate. No other evidence of acute ischemia or infarct. No midline shift. Ventricles and sulci are normal for age. IMPRESSION: 1. 6 mm rounded region of high attenuation the right cerebellar hemisphere with surrounding low attenuation. This is concerning for a small hemorrhage with surrounding edema. The etiology is unclear. It would be an unusual site for trauma but a posttraumatic cause is not excluded. Hemorrhagic transformation of a small infarct is possible. Bleeding of an underlying vascular abnormality or mass is not excluded. A hypertensive bleed could also occur in this location. An  MRI could better assess this region. 2. Age indeterminate lacunar infarct of the right thalamus. Findings called to Dr. Eulis Foster. Electronically Signed   By: Dorise Bullion III M.D   On: 02/20/2016 17:22   Mr Brain Wo Contrast  Result Date: 02/21/2016 CLINICAL DATA:  Cerebellar hemorrhage.  Dizziness.  Fall. EXAM: MRI HEAD WITHOUT CONTRAST TECHNIQUE: Multiplanar, multiecho pulse sequences of the brain and surrounding structures were obtained without intravenous contrast. COMPARISON:  Head CT 02/20/2016 and MRI 03/01/2013 FINDINGS: As seen on yesterday's head CT, there is an approximately 6 mm focus of hemorrhage in the medial right cerebellum with mild surrounding edema. There is no evidence of acute infarct separate from this hemorrhage. Chronic microhemorrhages are again seen in the pons and deep gray nuclei bilaterally and have increased in number from the prior MRI. A few microhemorrhages are also noted scattered more peripherally in the cerebral hemispheres. A small, chronic left cerebellar infarct is unchanged. There is a small, chronic infarct in the right pons which is new from the prior MRI. Chronic lacunar infarcts are again seen in the deep gray nuclei and cerebral white matter bilaterally. Cerebral atrophy is unchanged from the prior MRI. Patchy to confluent T2 hyperintensities in the cerebral white matter have  mildly progressed from the prior MRI and are compatible with extensive chronic small vessel ischemic disease. No mass, midline shift, or extra-axial fluid collection. Prior bilateral cataract extraction is noted. There is a trace right mastoid effusion. The paranasal sinuses are clear. Major intracranial vascular flow voids are preserved with intracranial arterial dolichoectasia again noted. IMPRESSION: 1. 6 mm right cerebellar hemorrhage with mild surrounding edema, likely hypertensive in etiology. 2. Increased number of chronic microhemorrhages, consistent with chronic hypertension. 3.  Progressive chronic small vessel ischemic disease with lacunar infarcts as above. Electronically Signed   By: Logan Bores M.D.   On: 02/21/2016 12:49    Lovenia Kim, MD 02/22/2016, 8:33 AM PGY-1, [place] Intern pager: [phone], text pages welcome

## 2016-02-22 NOTE — Progress Notes (Addendum)
Patient's blood pressure 182/66 after administration of 5mg  IV hydralazine. MD on call notified. Order placed for extra dose of 5mg  IV hydralazine to be given. Will continue to monitor the patient closely.   Shelbie Hutching, RN, BSN

## 2016-02-23 DIAGNOSIS — N179 Acute kidney failure, unspecified: Secondary | ICD-10-CM

## 2016-02-23 DIAGNOSIS — N189 Chronic kidney disease, unspecified: Secondary | ICD-10-CM

## 2016-02-23 DIAGNOSIS — S52021D Displaced fracture of olecranon process without intraarticular extension of right ulna, subsequent encounter for closed fracture with routine healing: Secondary | ICD-10-CM

## 2016-02-23 LAB — BASIC METABOLIC PANEL
ANION GAP: 9 (ref 5–15)
BUN: 31 mg/dL — ABNORMAL HIGH (ref 6–20)
CALCIUM: 9 mg/dL (ref 8.9–10.3)
CHLORIDE: 108 mmol/L (ref 101–111)
CO2: 23 mmol/L (ref 22–32)
CREATININE: 2.4 mg/dL — AB (ref 0.44–1.00)
GFR calc non Af Amer: 17 mL/min — ABNORMAL LOW (ref >60)
GFR, EST AFRICAN AMERICAN: 20 mL/min — AB (ref >60)
Glucose, Bld: 93 mg/dL (ref 65–99)
Potassium: 4 mmol/L (ref 3.5–5.1)
Sodium: 140 mmol/L (ref 135–145)

## 2016-02-23 LAB — CBC
HCT: 38.2 % (ref 36.0–46.0)
HEMOGLOBIN: 11.8 g/dL — AB (ref 12.0–15.0)
MCH: 28 pg (ref 26.0–34.0)
MCHC: 30.9 g/dL (ref 30.0–36.0)
MCV: 90.5 fL (ref 78.0–100.0)
PLATELETS: 255 10*3/uL (ref 150–400)
RBC: 4.22 MIL/uL (ref 3.87–5.11)
RDW: 14.7 % (ref 11.5–15.5)
WBC: 6.7 10*3/uL (ref 4.0–10.5)

## 2016-02-23 MED ORDER — HYDRALAZINE HCL 50 MG PO TABS
50.0000 mg | ORAL_TABLET | Freq: Three times a day (TID) | ORAL | Status: DC
  Administered 2016-02-23 – 2016-02-24 (×2): 50 mg via ORAL
  Filled 2016-02-23 (×3): qty 1

## 2016-02-23 NOTE — Progress Notes (Signed)
Family Medicine Teaching Service Daily Progress Note Intern Pager: [phone]  Patient name: Meredith Bender Medical record number: [id number] Date of birth: [birth year] Age: 82 y.o. Gender: female  Primary Care Provider: [name], MD Consultants: Neurology Code Status: Partial (intubate)  Pt Overview and Major Events to Date:  8/5 - Pt admitted after ~ 1 week of unsteadiness and falls x 2  Assessment and Plan: Meredith L Pinnixis a 82 y.o.female  with hx of previous cerebellar infarcts, CKD 4, HLD, HTN, vulvar cancer (s/p radiation), cystitis, BPPV?, arthritic knee pain, and previous hip fracture.  #Recent falls w/R cerebellar hemorrhage: Stable.  Pt continues to have neuro exam WNL this AM.  - per neurology, keep BP</= 160/90 and at 1-2w follow up with goal of 140/90 - no anticoagulents or antithrombotics   - q4h neuro and BP checks - hydralazine PRN q4h - Appreciate neuro recs - F/u PT/OT - Given recent falls: UA with trace leukocytes and 6-30 WBC on add-on; Urine culture pending   #HTN with strict BP parameters given hemorrhagic stroke:  - patient with verapamil 240 mg qd and PRN hydralazine 5mg  q4h - increased standing hydralazine to 50 mg TID - still above BP goal of 160/90 - q4h BP checks (most often that they can be done on med-surg)  CKD-IV: BL SCr ~ 2.2. Stable around 2.4 on this admission - Monitor renal function --> Improved at 2.16 on 8/6 - Avoid nephrotoxic medications  Olecranon fracture:Fracture of proximal aspect noted on x-ray 8/5.S/p splint and sling placement - Continue home tramadol 50 mg q6h prn - Tylenol prn - gave 1 time dose of norco 8/6.  - f/u with ortho outpatient   FEN/GI: Heart Healthy Diet, prilosec Prophylaxis: SCDs  Disposition: SNF pending clinical improvement  Subjective:  Patient feels well this morning, no complaints  Objective: Temp:  [97.4 F (36.3 C)-98.5 F (36.9 C)] 98.4 F (36.9 C) (08/08 0356) Pulse Rate:  [75-99] 80  (08/08 0642) Resp:  [18-20] 19 (08/08 0642) BP: (121-175)/(45-73) 152/58 (08/08 0642) SpO2:  [93 %-97 %] 93 % (08/08 0356) Weight:  [47.3 kg (104 lb 3.2 oz)] 47.3 kg (104 lb 3.2 oz) (08/07 2002) Physical Exam: General: NAD, rests comfortably Cardiovascular: RRR, no m/r/g Respiratory: CTA bilaterally, no W/R/R Abdomen: soft, nontender,nondistended Extremities: full ROM, warm and well-perfused  Laboratory:  Recent Labs Lab 02/21/16 0801 02/22/16 0455 02/23/16 0527  WBC 5.8 5.6 6.7  HGB 12.2 11.2* 11.8*  HCT 37.9 36.0 38.2  PLT 237 227 255    Recent Labs Lab 02/21/16 0801 02/22/16 0455 02/23/16 0527  NA 140 141 140  K 4.0 4.2 4.0  CL 110 111 108  CO2 22 23 23   BUN 30* 30* 31*  CREATININE 2.16* 2.38* 2.40*  CALCIUM 8.9 8.8* 9.0  GLUCOSE 113* 95 93      Imaging/Diagnostic Tests: No results found.   Meredith Coombe, MD 02/23/2016, 8:30 AM PGY-1, [place] Intern pager: [phone], text pages welcome

## 2016-02-23 NOTE — Progress Notes (Signed)
0.25 ml of Hydralazine given for blood pressure of 166/62. Blood pressure equals 152/58 after hydralazine. Will continue to monitor.

## 2016-02-23 NOTE — Progress Notes (Signed)
Physical Therapy Treatment Patient Details Name: OREE ESSIG MRN: [id number] DOB: 1926/01/29 Today's Date: 02/23/2016    History of Present Illness Crissy L. Villella is an 82 y.o. female presenting with unsteadiness, a left olecranon fracture s/p fall, and a small R cerebellar hemorrhage. PMH is significant for CKD 4, HLD, HTN, vulvar cancer (s/p radiation), cystitis, BPPV?, arthritic knee pain, previous hip fracture, and previous cerebellar infarcts.     PT Comments    Continued work on gait, balance, and activity tolerance during this session. Ms. Redmon ambulated through hallway using a single-point cane; PT encouraged heel-to-toe pattern and longer strides. She had difficulty during stance phase on her R leg, suggesting R hip muscle weakness. PT will work on hip strengthening during next session. Ms. Burkart will continue to benefit from skilled PT services to progress toward D/C goals.   Follow Up Recommendations  SNF;Supervision/Assistance - 24 hour     Equipment Recommendations  Other (comment) (Unilateral device: cane/quad cane/hemiwalker)    Recommendations for Other Services       Precautions / Restrictions Precautions Precautions: Fall;Other (comment) (NWB RUE) Required Braces or Orthoses: Sling (RUE) Restrictions Weight Bearing Restrictions: Yes    Mobility  Bed Mobility Overal bed mobility: Needs Assistance Bed Mobility: Sidelying to Sit   Sidelying to sit: Supervision (Required use of bed rail to prop up to sitting)       General bed mobility comments: Pt sat on R side of bed; unbothered by RUE, may have put some pressure through it to prop up.  Transfers Overall transfer level: Needs assistance Equipment used: None Transfers: Sit to/from Stand Sit to Stand: Min guard         General transfer comment: Pt able to power-up using LEs and LUE; appeared to push against EOB with LEs when powering up  Ambulation/Gait Ambulation/Gait assistance: Min  guard Ambulation Distance (Feet): 150 Feet Assistive device: Straight cane (Transitioned from dynamap to cane) Gait Pattern/deviations: Shuffle;Decreased step length - left;Decreased stance time - right;Decreased weight shift to right;Narrow base of support;Trunk flexed   Gait velocity interpretation: Below normal speed for age/gender (Notable increase in gait speed from 8/7) General Gait Details: Pt used dynamap in place of cane to start; wheels and larger BOS make it easier for her to use. Transitioned to cane, pt was nervous/hesitant. PT encouraged heel-to-toe pattern and longer stride length; when cued, pt able to perform both with RLE but less so with LLE; weakness in R hip musculature likely due to previous hip fx. Pt abducted R shoulder and sling for balance, esp. with cane.   Stairs            Wheelchair Mobility    Modified Rankin (Stroke Patients Only)       Balance Overall balance assessment: Needs assistance Sitting-balance support: No upper extremity supported Sitting balance-Leahy Scale: Good     Standing balance support: Single extremity supported (Cane) Standing balance-Leahy Scale: Fair                      Cognition Arousal/Alertness: Awake/alert Behavior During Therapy: WFL for tasks assessed/performed Overall Cognitive Status: Within Functional Limits for tasks assessed                      Exercises      General Comments General comments (skin integrity, edema, etc.):  Pt needs reinforcement of NWB RUE. Pt expressed that she is nervous about walking due to previous falls; PT encouraged her  to call nurse and use AD whenever she needs to get up/move around; pt will have daughter bring her cane from home the next time she comes in. BP and HR monitored throughout session: 163/68, 79 (sitting); 144/69, 83 (standing); 183/97, 80 (sitting, post ambulation).      Pertinent Vitals/Pain Pain Assessment: 0-10 Pain Score: 9  (RUE pain; Reported  pain to RN) Pain Location: RUE Pain Descriptors / Indicators:  (Did not describe pain) Pain Intervention(s): Limited activity within patient's tolerance;Monitored during session;Repositioned;RN gave pain meds during session    Home Living                      Prior Function            PT Goals (current goals can now be found in the care plan section) Acute Rehab PT Goals Patient Stated Goal: Did not state PT Goal Formulation: With patient Time For Goal Achievement: 03/07/16 Potential to Achieve Goals: Good Progress towards PT goals: Progressing toward goals    Frequency  Min 3X/week    PT Plan Current plan remains appropriate    Co-evaluation             End of Session Equipment Utilized During Treatment: Gait belt Activity Tolerance: Patient tolerated treatment well Patient left: in chair;with call bell/phone within reach     Time: 1015-1045 (Start and Stop times are approximate) PT Time Calculation (min) (ACUTE ONLY): 30 min  Charges:  $Gait Training: 23-37 mins                    G Codes:      Chaney Malling, SPT Acute Rehabilitation Services  Chaney Malling 02/23/2016, 12:39 PM

## 2016-02-24 ENCOUNTER — Telehealth: Payer: Self-pay | Admitting: Family Medicine

## 2016-02-24 ENCOUNTER — Encounter: Payer: Medicare Other | Admitting: Family Medicine

## 2016-02-24 LAB — BASIC METABOLIC PANEL
ANION GAP: 8 (ref 5–15)
BUN: 33 mg/dL — ABNORMAL HIGH (ref 6–20)
CALCIUM: 8.8 mg/dL — AB (ref 8.9–10.3)
CO2: 24 mmol/L (ref 22–32)
CREATININE: 2.65 mg/dL — AB (ref 0.44–1.00)
Chloride: 107 mmol/L (ref 101–111)
GFR calc Af Amer: 17 mL/min — ABNORMAL LOW (ref >60)
GFR, EST NON AFRICAN AMERICAN: 15 mL/min — AB (ref >60)
GLUCOSE: 92 mg/dL (ref 65–99)
Potassium: 4.1 mmol/L (ref 3.5–5.1)
Sodium: 139 mmol/L (ref 135–145)

## 2016-02-24 LAB — CBC
HCT: 37.5 % (ref 36.0–46.0)
HEMOGLOBIN: 11.8 g/dL — AB (ref 12.0–15.0)
MCH: 28.2 pg (ref 26.0–34.0)
MCHC: 31.5 g/dL (ref 30.0–36.0)
MCV: 89.5 fL (ref 78.0–100.0)
Platelets: 259 10*3/uL (ref 150–400)
RBC: 4.19 MIL/uL (ref 3.87–5.11)
RDW: 14.6 % (ref 11.5–15.5)
WBC: 6.1 10*3/uL (ref 4.0–10.5)

## 2016-02-24 MED ORDER — CALCIUM CARBONATE 1250 (500 CA) MG PO TABS
500.0000 mg | ORAL_TABLET | Freq: Every day | ORAL | Status: DC
  Administered 2016-02-24 – 2016-02-25 (×2): 500 mg via ORAL
  Filled 2016-02-24 (×2): qty 1

## 2016-02-24 MED ORDER — HYDRALAZINE HCL 50 MG PO TABS
50.0000 mg | ORAL_TABLET | Freq: Four times a day (QID) | ORAL | Status: DC
  Administered 2016-02-24 – 2016-02-25 (×5): 50 mg via ORAL
  Filled 2016-02-24 (×5): qty 1

## 2016-02-24 MED ORDER — HYDROCORTISONE 1 % EX CREA
TOPICAL_CREAM | Freq: Three times a day (TID) | CUTANEOUS | Status: DC
  Filled 2016-02-24: qty 28

## 2016-02-24 NOTE — Care Management Important Message (Signed)
Important Message  Patient Details  Name: Meredith Bender MRN: [id number] Date of Birth: February 29, 1926   Medicare Important Message Given:  Yes    Javeon Macmurray, [place]oy [place] 02/24/2016, 3:12 PM

## 2016-02-24 NOTE — Clinical Social Work Note (Signed)
Received SNF consult on 8/8 and completed assessment with patient and her daughter Tomasita Morrow at the bedside today (full assessment to follow). Bed search initiated in [place] per daughter/patient request.  Norvell Ureste Givens, MSW, LCSW Licensed Clinical Social Worker [place] [phone]

## 2016-02-24 NOTE — Progress Notes (Signed)
CALL [facility] [phone] for any questions or notifications regarding this patient  FMTS Attending Note: [name] MD Meredith Bender is my continuity care patient. I visited with her today. I appreciate the excellent  Care from our inpatient team and the consults by neurology. I have reviewed her elbow x ray---she has an avulsion of the most posterior portion of the olecranon that is displaced about 2 mm from the donor site.  She was placed in a posterior splint in the ED. I examied her today and her pain is minimal. On day of injury she had minimal pain and continued to have full extension of her arm. I have discussed witthe team--at this point she has been immobilized for 4 days and is at risk for loss of ROM of the elbow. I will have them remove splint and I will re-examine her afterward. My plan is to discontinue splint.

## 2016-02-24 NOTE — Discharge Summary (Signed)
[facility] Discharge Summary  Patient name: Meredith Bender Medical record number: [id number] Date of birth: 1926/06/22 Age: 82 y.o. Gender: female Date of Admission: 02/20/2016  Date of Discharge: 02/24/2016 Admitting Physician: Lupita Dawn, MD  Primary Care Provider: [name], MD Consultants: Neurology  Indication for Hospitalization: s/p fall and olecranon fracture  Discharge Diagnoses/Problem List:  Right olecranon fracture Right cerebellar hemorrhage + hx of previous cerebellar infarcts CKD4 HLD HTN  Disposition: SNF  Discharge Condition: Stable, improved  Discharge Exam:  General: NAD, rests comfortably, pleasant Cardiovascular: RRR, no m/r/g Respiratory: CTA bilaterally, no W/R/R, no increased work of breathing Abdomen: soft, nontender,nondistended, +BS Extremities: FROMx3, able to extend right arm with minimal pain due to olecranon injury, extremities warm and well-perfused, no edema Psych: mood appropriate  Brief Hospital Course:   Meredith L Pinnixis a 82 y.o.female with hx of previous cerebellar infarcts, CKD 4, HLD, HTN, vulvar cancer (s/p radiation), cystitis, arthritic knee pain, and previous hip fracture who was found to have a right-sided cerebellar hemorrhage.   Recent falls with right cerebellar hemorrhage MRI brain on 8/6 showed 38mm right cerebellar hemorrhage with mild surrounding edema, likely hypertensive in etiology, increased number of chronic microhemorrhages, consistent with chronic HTN, progressive chronic small vessel dx with lacunar infarcts. Patient was stable with no focal weakness or loss of sensation. No leukocytosis or fevers, TSH and B12 workup performed for concerns of memory loss and both levels normal. Neurology was consulted and recommended goal BP </= 160/90 and at 1-2 weeks follow up with goal of 140/90. No anticoagulants or antithrombotics given during admission. Q4 neuro checks and BP checks were performed.  PT was seen and recommended SNF or supervision/assitance 24h with use of a cane or hemiwalker given her history of falls. Patient continued to have normal neurologic exams and at time of discharge was at her baseline.   HTN with strict BP parameters given hemorrhagic stroke Patient was continued on home Verapamil and given prn hydralazine 5mg  as needed. Throughout her admission her blood pressures remained persistently high and her Hydralazine was increased from 50mg  TID to QID. She was discharged on this dosage to help better control her blood pressures. Goal BP as above of <160/90 and after 1-2 weeks, <140/90. At time of discharge she was otherwise stable.   HLD Patient has history of HLD and had an LDL 123. Per neurology recommendation goal <70 and Lipitor 40mg  daily at time of discharge was discussed. It was not given while inpatient due to contraindication with acute intracranial hemorrhage.   CKD-IV Creatinine function around 2.4 on admission. Creatinine bumped up during hospitalization to and was monitored daily with BMP. Nephrotoxic medications were also avoided during her stay.  Olecranon fracture Patient also complaining of right elbow pain. Sustained a fracture of proximal aspect noted on x-ray 8/5.She was splinted in ED and placed in a sling. Home tramadol 50mg  prn was continued for her pain and she required a one time dose of Norco 8/6. [place]scal supplementation started while inpatient given her history of fractures. Will continue upon discharge. Patient already taking Vit D at home. Patient will follow up with outpatient Ortho and PCP Dr. Nori Riis.   Issues for Follow Up:  -Will need to follow outpatient for olecranon avulsion -plan to discontinue splint per, Dr. Nori Riis, as to have good movement of the arm and maintain ROM -will require close follow up for blood pressure monitoring given her high BP. Goal BP per neurology recommendation of <160/90  and after 1 week 140/90.  -will be  discharged home on Hydralazine 50mg  QID to better manage her blood pressures -Calcium supplementation started while inpatient given her history of fractures. Will need to continue daily upon discharge along with her Vitamin D she already takes. -Statin medication (Lipitor) prescribed per neuro recommendation once discharged from hospital -Will need supervision given her increased risk of falls (SNF)  Significant Procedures: CT head, MRI head, splint/sling right arm  Significant Labs and Imaging:   Recent Labs Lab 02/22/16 0455 02/23/16 0527 02/24/16 0536  WBC 5.6 6.7 6.1  HGB 11.2* 11.8* 11.8*  HCT 36.0 38.2 37.5  PLT 227 255 259    Recent Labs Lab 02/20/16 1220 02/21/16 0801 02/22/16 0455 02/23/16 0527 02/24/16 0536  NA 137 140 141 140 139  K 4.5 4.0 4.2 4.0 4.1  CL 108 110 111 108 107  CO2 22 22 23 23 24   GLUCOSE 103* 113* 95 93 92  BUN 36* 30* 30* 31* 33*  CREATININE 2.37* 2.16* 2.38* 2.40* 2.65*  CALCIUM 8.8* 8.9 8.8* 9.0 8.8*     Results/Tests Pending at Time of Discharge: None  Discharge Medications:    Discharge Instructions: Please refer to Patient Instructions section of EMR for full details.  Patient was counseled important signs and symptoms that should prompt return to medical care, changes in medications, dietary instructions, activity restrictions, and follow up appointments.   Follow-Up Appointments: Please see PCP Dr. Nori Riis for hospital follow-up within 48 hours of discharge.  Lovenia Kim, MD 02/24/2016, 5:32 PM PGY-1, [place]

## 2016-02-24 NOTE — NC FL2 (Signed)
[place] MEDICAID FL2 LEVEL OF CARE SCREENING TOOL     IDENTIFICATION  Patient Name: Meredith Bender Birthdate: [birth year] Sex: female Admission Date (Current Location): 02/20/2016  [facility] and [place] Number:  Herbalist and Address:  [facility] [place], [place] [address], [place], [place] [address]      Provider Number:    Attending Physician Name and Address:  Lupita Dawn, MD  Relative Name and Phone Number:  Tomasita Morrow - daughter.  Phone [phone]    Current Level of Care: Hospital Recommended Level of Care: [place] Prior Approval Number:    Date Approved/Denied:   PASRR Number: [id number] (Eff. 07/26/10)  Discharge Plan: SNF    Current Diagnoses: Patient Active Problem List   Diagnosis Date Noted  . Acute on chronic kidney failure ([place])   . Intracerebral hemorrhage 02/22/2016  . Cerebellar hemorrhage ([place])   . Olecranon fracture   . Intracranial hemorrhage ([place]) 02/20/2016  . Near syncope 02/20/2016  . 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 Dysuria 05/05/2015  . Primary vulvar cancer ([place]) 03/19/2015  . Urethral lesion 03/06/2015  . Vulvar mass 03/06/2015  . Left ovarian cyst 03/06/2015  . Postmenopausal vaginal bleeding 02/25/2015  . s/p hysterectomy for prolapsed uterus 02/25/2015  . Vaginal mass 02/25/2015  . History of repair for rectocele 02/25/2015  . Living accommodation issues 12/18/2014  . Knee pain, bilateral 01/09/2014  . Rotator cuff syndrome of both shoulders 01/09/2014  . Arthritis 12/02/2013  . DNR (do not resuscitate) discussion 12/02/2013  . Intertrochanteric fracture of right hip ([place]) 08/29/2013  . History of UTI 06/12/2013  . Routine health maintenance 06/12/2013  . Renal insufficiency 02/28/2013  . Dyslipidemia 02/28/2013  . Essential hypertension     Orientation RESPIRATION BLADDER Height & Weight     Self, Time, Situation, Place  Normal Continent Weight: 104 lb 3.2 oz (47.3  kg) Height:  5\' 5"  (165.1 cm)  BEHAVIORAL SYMPTOMS/MOOD NEUROLOGICAL BOWEL NUTRITION STATUS      Continent Diet (Heart healthy)  AMBULATORY STATUS COMMUNICATION OF NEEDS Skin   Limited Assist Verbally Normal                       Personal Care Assistance Level of Assistance  Bathing, Feeding, Dressing Bathing Assistance: Maximum assistance Feeding assistance: Independent (Assistance needed withy set-up) Dressing Assistance: Maximum assistance     Functional Limitations Info  Sight, Hearing, Speech Sight Info: Impaired (Wears glasses for reading) Hearing Info: Impaired Speech Info: Adequate    SPECIAL CARE FACTORS FREQUENCY  PT (By licensed PT), OT (By licensed OT)     PT Frequency: Evaluated 8/7 and a minimum of 3X per week therapy recommended OT Frequency: Evaluated 8/7 and a minimum of 2X per week therapy recommended            Contractures Contractures Info: Not present    Additional Factors Info  Code Status, Allergies Code Status Info: Partial Code Allergies Info: Codeine, penicillins           Current Medications (02/24/2016):  This is the current hospital active medication list Current Facility-Administered Medications  Medication Dose Route Frequency Provider Last Rate Last Dose  . 0.9 %  sodium chloride infusion  250 mL Intravenous PRN Rogue Bussing, MD      . acetaminophen (TYLENOL) tablet 650 mg  650 mg Oral Q6H PRN Rogue Bussing, MD   650 mg at 02/24/16 1626   Or  . acetaminophen (  TYLENOL) suppository 650 mg  650 mg Rectal Q6H PRN Rogue Bussing, MD      . calcium carbonate (OS-CAL - dosed in mg of elemental calcium) tablet 500 mg of elemental calcium  500 mg of elemental calcium Oral Q breakfast Lovenia Kim, MD   500 mg of elemental calcium at 02/24/16 1134  . hydrALAZINE (APRESOLINE) injection 5 mg  5 mg Intravenous Q4H PRN Everrett Coombe, MD   5 mg at 02/24/16 0433  . hydrALAZINE (APRESOLINE) tablet 50 mg  50 mg Oral Q6H  Everrett Coombe, MD   50 mg at 02/24/16 1134  . hydrocortisone cream 1 %   Topical TID [name], MD      . loratadine (CLARITIN) tablet 10 mg  10 mg Oral Daily Rogue Bussing, MD   10 mg at 02/24/16 1143  . pantoprazole (PROTONIX) EC tablet 40 mg  40 mg Oral Daily Rogue Bussing, MD   40 mg at 02/24/16 1134  . polyethylene glycol (MIRALAX / GLYCOLAX) packet 17 g  17 g Oral Daily PRN Rogue Bussing, MD   17 g at 02/21/16 [id number]  . sodium chloride flush (NS) 0.9 % injection 3 mL  3 mL Intravenous Q12H Rogue Bussing, MD   3 mL at 02/24/16 1135  . sodium chloride flush (NS) 0.9 % injection 3 mL  3 mL Intravenous PRN Rogue Bussing, MD      . traMADol Veatrice Bourbon) tablet 50 mg  50 mg Oral Q6H PRN Rogue Bussing, MD   50 mg at 02/24/16 1134  . verapamil (CALAN-SR) CR tablet 240 mg  240 mg Oral Daily Lupita Dawn, MD   240 mg at 02/24/16 1143     Discharge Medications: Please see discharge summary for a list of discharge medications.  Relevant Imaging Results:  Relevant Lab Results:   Additional Information Patient lives in [place], her daughter lives in [place], [place] (address on facesheet).  [id number]  Sable Feil, LCSW

## 2016-02-24 NOTE — Progress Notes (Signed)
Nurse removed Cast

## 2016-02-24 NOTE — Telephone Encounter (Signed)
Pt's family just wanted to let Dr. Nori Riis know the pt has been admitted to the hospital. ep

## 2016-02-24 NOTE — Progress Notes (Signed)
Physical Therapy Treatment Patient Details Name: Meredith Bender MRN: [id number] DOB: [birth year] Today's Date: 02/24/2016    History of Present Illness Meredith Bender is an 82 y.o. female presenting with unsteadiness, a left olecranon fracture s/p fall, and a small R cerebellar hemorrhage. PMH is significant for CKD 4, HLD, HTN, vulvar cancer (s/p radiation), cystitis, BPPV?, arthritic knee pain, previous hip fracture, and previous cerebellar infarcts.    PT Comments    Continued work on gait, balance, and activity tolerance during this session. Meredith Bender ambulated through hallway using a quad cane and single-point cane; she prefers quad cane. PT continued to encourage heel-to-toe pattern and longer strides. She still has difficulty during stance phase on R leg, so PT introduced hip strengthening exercises (single leg stance, hip ab/adduction). Meredith Bender performed exercises holding onto the counter at sink; she required more stability while on her R leg, also stating activities while standing on her R leg were more difficult. Meredith Bender will continue to benefit from skilled PT services to progress toward D/C goals.  During today's session Meredith Bender seemed to have more trouble than usual recalling events that have occurred within the past couple days.   Follow Up Recommendations  SNF;Supervision/Assistance - 24 hour     Equipment Recommendations  Cane (Quad cane)    Recommendations for Other Services       Precautions / Restrictions Precautions Precautions: Fall Required Braces or Orthoses: Sling (RUE) Restrictions Weight Bearing Restrictions: Yes RUE Weight Bearing: Non weight bearing    Mobility  Bed Mobility Overal bed mobility: Needs Assistance Bed Mobility: Sidelying to Sit   Sidelying to sit: Supervision (Required use of bed rail to prop up to sitting)       General bed mobility comments: Pt sat on R side of bed; unbothered by RUE, may have put some pressure through  it to prop up. Pt needs reminders/education for RUE restrictions; verbal cue to keep R arm close to chest.  Transfers Overall transfer level: Needs assistance Equipment used: None Transfers: Sit to/from Stand Sit to Stand: Min guard         General transfer comment: Pt able to power-up using LEs and LUE; appeared to push against EOB with LEs when powering up  Ambulation/Gait Ambulation/Gait assistance: Min guard Ambulation Distance (Feet): 150 Feet Assistive device: Straight cane;Quad cane (Transitioned from dynamap to quad cane, then straight cane) Gait Pattern/deviations: Shuffle;Decreased step length - right;Decreased stance time - right;Decreased weight shift to right;Narrow base of support;Trunk flexed   Gait velocity interpretation: Below normal speed for age/gender General Gait Details: Pt used dynamap in place of cane to start; transitioned to quad cane; transitioned to single point cane. Pt prefered quad cane to single point cane, stating that she feels more supported. Pt is unsteady taking her first steps (single limb support is difficult), but becomes more balanced as she gets going. PT continued to encourage heel-to-toe pattern and longer strides. Pt prefers walking in her activity sandals.   Stairs            Wheelchair Mobility    Modified Rankin (Stroke Patients Only)       Balance Overall balance assessment: Needs assistance Sitting-balance support: No upper extremity supported Sitting balance-Leahy Scale: Good     Standing balance support: Single extremity supported (Dynamap, cane, or PT) Standing balance-Leahy Scale: Fair   Single Leg Stance - Right Leg: 10 (x2 (holding counter at sink)) Single Leg Stance - Left Leg: 10 (x2 (holding counter  at sink))                Cognition Arousal/Alertness: Awake/alert Behavior During Therapy: WFL for tasks assessed/performed Overall Cognitive Status: Within Functional Limits for tasks assessed Area of  Impairment: Memory     Memory: Decreased short-term memory (Did not remember receiving therapy yesterday (8/8))         General Comments: Pt had difficulty recalled events that have taken place within the past couple days.    Exercises General Exercises - Lower Extremity Hip ABduction/ADduction: AROM;Strengthening;Both;15 reps;Standing (Pt holding onto counter at sink)    General Comments General comments (skin integrity, edema, etc.): Pt still nervous about walking due to falls. Pt needs reinforcement/education about RUE restrictions. PT continued to encourage pt to call nurse with any tranfers/mobility; pt states she is OK moving around by herself if she can hold onto the bed or chair; needs reiforcement with fall precautions. BP and HR monitored throughout session (recorded by RN, see vitals).      Pertinent Vitals/Pain Pain Assessment: No/denies pain    Home Living                      Prior Function            PT Goals (current goals can now be found in the care plan section) Acute Rehab PT Goals Patient Stated Goal: Did not state PT Goal Formulation: With patient Time For Goal Achievement: 03/07/16 Potential to Achieve Goals: Good Progress towards PT goals: Progressing toward goals    Frequency  Min 3X/week    PT Plan Current plan remains appropriate    Co-evaluation             End of Session Equipment Utilized During Treatment: Gait belt Activity Tolerance: Patient tolerated treatment well Patient left: in chair;with call bell/phone within reach;with chair alarm set     Time: [id number] PT Time Calculation (min) (ACUTE ONLY): 39 min  Charges:                       G Codes:      Chaney Malling 02/29/16, 1:05 PM

## 2016-02-24 NOTE — Telephone Encounter (Signed)
PCP is aware of pt being in hospital. Katharina Caper, Madoc Holquin D, CMA

## 2016-02-24 NOTE — Progress Notes (Signed)
Family Medicine Teaching Service Daily Progress Note Intern Pager: [phone]  Patient name: Meredith Bender Medical record number: [id number] Date of birth: Sep 24, 1925 Age: 82 y.o. Gender: female  Primary Care Provider: [name], MD Consultants: Neurology Code Status: Partial (intubate)  Pt Overview and Major Events to Date:  8/5 - Pt admitted after ~ 1 week of unsteadiness and falls x 2  Assessment and Plan: Meredith L Pinnixis a 82 y.o.female  with hx of previous cerebellar infarcts, CKD 4, HLD, HTN, vulvar cancer (s/p radiation), cystitis, BPPV?, arthritic knee pain, and previous hip fracture.  #Recent falls w/R cerebellar hemorrhage: Stable.  Pt continues to have neuro exam WNL this AM.  - per neurology, keep BP</= 160/90 and at 1-2w follow up with goal of 140/90 - no anticoagulents or antithrombotics   - q4h neuro and BP checks - hydralazine PRN q4h - F/u PT recc SNF or supervision/assistance 24h, recc use of cane, hemiwalker - Given recent falls: UA with trace leukocytes and 6-30 WBC on add-on; Urine culture pending - multiple species present, suggest recollection. Re-order if symptomatic.   #HTN with strict BP parameters given hemorrhagic stroke:  - patient with verapamil 240 mg qd and PRN hydralazine 5mg  q4h - increased standing hydralazine to 50 mg TID - still above BP goal of 160/90 - q4h BP checks (most often that they can be done on med-surg) -required her prn Hydralazine overnight due to BP of 175/75 -D/C with Hydralazine 50mg  ( changed from TID to QID today)  #HLD -LDL 123, goal <70 - Neuro reccs: consider Lipitor 40mg  daily at time of d/c - contraindicated with acute intracranial hemorrhage  #CKD-IV: Cr ~ 2.65. Around 2.4 on this admission - Monitor renal function - Avoid nephrotoxic medications  #Olecranon fracture:Fracture of proximal aspect noted on x-ray 8/5.S/p splint and sling placement - Continue home tramadol 50 mg q6h prn - Tylenol prn - gave 1 time  dose of norco 8/6.  - f/u with ortho outpatient -Calcium supplementation Oscal started given history of fractures. Will continue upon discharge. Patient already taking Vit D at home.   FEN/GI: Heart Healthy Diet, prilosec Prophylaxis: SCDs  Disposition: SNF pending clinical improvement. Call for bed. CM and SW on case.  Subjective:  Patient feels well this morning, no complaints except some shoulder pain consistent with her olecranon fracture.   Objective: Temp:  [97.7 F (36.5 C)-98.7 F (37.1 C)] 98.4 F (36.9 C) (08/09 0358) Pulse Rate:  [70-84] 78 (08/09 0358) Resp:  [16-20] 16 (08/09 0358) BP: (132-179)/(52-76) 161/66 (08/09 0533) SpO2:  [95 %-98 %] 97 % (08/09 0358)   Physical Exam: General: NAD, rests comfortably Cardiovascular: RRR, no m/r/g Respiratory: CTA bilaterally, no W/R/R Abdomen: soft, nontender,nondistended Extremities: full ROM, warm and well-perfused  Laboratory:  Recent Labs Lab 02/22/16 0455 02/23/16 0527 02/24/16 0536  WBC 5.6 6.7 6.1  HGB 11.2* 11.8* 11.8*  HCT 36.0 38.2 37.5  PLT 227 255 259    Recent Labs Lab 02/22/16 0455 02/23/16 0527 02/24/16 0536  NA 141 140 139  K 4.2 4.0 4.1  CL 111 108 107  CO2 23 23 24   BUN 30* 31* 33*  CREATININE 2.38* 2.40* 2.65*  CALCIUM 8.8* 9.0 8.8*  GLUCOSE 95 93 92    Imaging/Diagnostic Tests:  No results found.   Meredith Kim, MD 02/24/2016, 7:12 AM PGY-1, [place] Intern pager: [phone], text pages welcome

## 2016-02-25 DIAGNOSIS — I129 Hypertensive chronic kidney disease with stage 1 through stage 4 chronic kidney disease, or unspecified chronic kidney disease: Secondary | ICD-10-CM | POA: Diagnosis not present

## 2016-02-25 DIAGNOSIS — M25562 Pain in left knee: Secondary | ICD-10-CM | POA: Diagnosis not present

## 2016-02-25 DIAGNOSIS — E785 Hyperlipidemia, unspecified: Secondary | ICD-10-CM | POA: Diagnosis not present

## 2016-02-25 DIAGNOSIS — M545 Low back pain: Secondary | ICD-10-CM | POA: Diagnosis not present

## 2016-02-25 DIAGNOSIS — M25561 Pain in right knee: Secondary | ICD-10-CM | POA: Diagnosis not present

## 2016-02-25 DIAGNOSIS — I10 Essential (primary) hypertension: Secondary | ICD-10-CM | POA: Diagnosis not present

## 2016-02-25 DIAGNOSIS — S52024D Nondisplaced fracture of olecranon process without intraarticular extension of right ulna, subsequent encounter for closed fracture with routine healing: Secondary | ICD-10-CM | POA: Diagnosis not present

## 2016-02-25 DIAGNOSIS — K219 Gastro-esophageal reflux disease without esophagitis: Secondary | ICD-10-CM | POA: Diagnosis not present

## 2016-02-25 DIAGNOSIS — N184 Chronic kidney disease, stage 4 (severe): Secondary | ICD-10-CM | POA: Diagnosis not present

## 2016-02-25 DIAGNOSIS — R531 Weakness: Secondary | ICD-10-CM | POA: Diagnosis not present

## 2016-02-25 DIAGNOSIS — I6930 Unspecified sequelae of cerebral infarction: Secondary | ICD-10-CM | POA: Diagnosis not present

## 2016-02-25 DIAGNOSIS — I639 Cerebral infarction, unspecified: Secondary | ICD-10-CM | POA: Diagnosis not present

## 2016-02-25 DIAGNOSIS — S52021A Displaced fracture of olecranon process without intraarticular extension of right ulna, initial encounter for closed fracture: Secondary | ICD-10-CM | POA: Diagnosis not present

## 2016-02-25 DIAGNOSIS — R296 Repeated falls: Secondary | ICD-10-CM | POA: Diagnosis not present

## 2016-02-25 DIAGNOSIS — I614 Nontraumatic intracerebral hemorrhage in cerebellum: Secondary | ICD-10-CM | POA: Diagnosis not present

## 2016-02-25 DIAGNOSIS — N179 Acute kidney failure, unspecified: Secondary | ICD-10-CM | POA: Diagnosis not present

## 2016-02-25 DIAGNOSIS — I69128 Other speech and language deficits following nontraumatic intracerebral hemorrhage: Secondary | ICD-10-CM | POA: Diagnosis not present

## 2016-02-25 DIAGNOSIS — S52021D Displaced fracture of olecranon process without intraarticular extension of right ulna, subsequent encounter for closed fracture with routine healing: Secondary | ICD-10-CM | POA: Diagnosis not present

## 2016-02-25 LAB — CBC
HCT: 38.4 % (ref 36.0–46.0)
Hemoglobin: 12 g/dL (ref 12.0–15.0)
MCH: 27.8 pg (ref 26.0–34.0)
MCHC: 31.3 g/dL (ref 30.0–36.0)
MCV: 89.1 fL (ref 78.0–100.0)
PLATELETS: 262 10*3/uL (ref 150–400)
RBC: 4.31 MIL/uL (ref 3.87–5.11)
RDW: 14.4 % (ref 11.5–15.5)
WBC: 5.6 10*3/uL (ref 4.0–10.5)

## 2016-02-25 LAB — HEMOGLOBIN A1C
Hgb A1c MFr Bld: 5.4 % (ref 4.8–5.6)
Mean Plasma Glucose: 108 mg/dL

## 2016-02-25 LAB — BASIC METABOLIC PANEL
ANION GAP: 11 (ref 5–15)
BUN: 35 mg/dL — AB (ref 6–20)
CALCIUM: 9 mg/dL (ref 8.9–10.3)
CO2: 24 mmol/L (ref 22–32)
Chloride: 105 mmol/L (ref 101–111)
Creatinine, Ser: 2.37 mg/dL — ABNORMAL HIGH (ref 0.44–1.00)
GFR calc Af Amer: 20 mL/min — ABNORMAL LOW (ref >60)
GFR, EST NON AFRICAN AMERICAN: 17 mL/min — AB (ref >60)
Glucose, Bld: 93 mg/dL (ref 65–99)
POTASSIUM: 3.9 mmol/L (ref 3.5–5.1)
SODIUM: 140 mmol/L (ref 135–145)

## 2016-02-25 MED ORDER — CALCIUM CARBONATE 1250 (500 CA) MG PO TABS: 1.0000 | ORAL_TABLET | Freq: Every day | ORAL | 0 refills | Status: DC

## 2016-02-25 MED ORDER — ATORVASTATIN CALCIUM 40 MG PO TABS: 40.0000 mg | ORAL_TABLET | Freq: Every day | ORAL | Status: DC

## 2016-02-25 MED ORDER — TRAMADOL HCL 50 MG PO TABS: 50.0000 mg | ORAL_TABLET | Freq: Three times a day (TID) | ORAL | 5 refills | Status: DC

## 2016-02-25 MED ORDER — HYDRALAZINE HCL 50 MG PO TABS: 50.0000 mg | ORAL_TABLET | Freq: Four times a day (QID) | ORAL | Status: DC

## 2016-02-25 NOTE — Clinical Social Work Note (Signed)
Clinical Social Work Assessment  Patient Details  Name: Meredith Bender MRN: [id number] Date of Birth: 1925-09-21  Date of referral:  02/23/16               Reason for consult:  Facility Placement                Permission sought to share information with:  Family Supports Permission granted to share information::  Yes, Verbal Permission Granted  Name::     Meredith Bender  Agency::     Relationship::  Daughter  Contact Information:  [phone]  Housing/Transportation Living arrangements for the past 2 months:  Single Family Home Source of Information:  Other (Comment Required) (Daughter, Liliane Bade) Patient Interpreter Needed:  None Criminal Activity/Legal Involvement Pertinent to Current Situation/Hospitalization:  No - Comment as needed Significant Relationships:  Adult Children, Other Family Members Lives with:  Self Do you feel safe going back to the place where you live?  No (Patient and daughter in agreement with ST rehab before returning home) Need for family participation in patient care:  Yes (Comment)  Care giving concerns:  Daughter/patient understands and agrees that ST rehab appropriate post-discharge.    Social Worker assessment / plan:  CSW talked with patient and daughter at the bedside on 8/9 regarding recommendation of ST rehab. Daughter, Patient was sitting up in bed and was alert, oriented and pleasant. She participated minimally in conversation. Daughter active participant in conversation and agrees with rehab as did patient. CSW informed that patient was in rehab before 2 years ago and it was "OK" per daughter.  Ms. Georga Bender reported that she has 3 brothers, 1 lives in [place] and the other 2 live out of state.  Facility preferences were provided by daughter and agreed on by patient. Facility search process explained and SNF list for [facility] provided.  Ms. Georga Bender clarified that she lives in [place] (patient's address shows as [place]) and her mother lives in  [place].   Employment status:  Retired Health visitor, Managed Care PT Recommendations:  [place] / Referral to community resources:  [place] (Daughter provided with facility list.)  Patient/Family's Response to care:  No concerns expressed regarding care during hospitalization.  Patient/Family's Understanding of and Emotional Response to Diagnosis, Current Treatment, and Prognosis:  Not discussed.  Emotional Assessment Appearance:  Appears stated age Attitude/Demeanor/Rapport:  Other (Appropriate) Affect (typically observed):  Appropriate, Pleasant Orientation:  Oriented to Self, Oriented to Place, Oriented to  Time, Oriented to Situation Alcohol / Substance use:  Never Used Psych involvement (Current and /or in the community):  No (Comment)  Discharge Needs  Concerns to be addressed:  Discharge Planning Concerns Readmission within the last 30 days:    Current discharge risk:  None Barriers to Discharge:  No Barriers Identified   Sable Feil, LCSW 02/25/2016, 1:08 PM

## 2016-02-25 NOTE — Progress Notes (Signed)
Pt d/c'd with family transporting to [facility], pt was transported with belongings via wheelchair escorted by unit NT and family.   At 1525 report called to [facility], report given to [facility] receiving nurse.

## 2016-02-25 NOTE — Discharge Summary (Signed)
[facility] Discharge Summary  Patient name: Meredith Bender Medical record number: [id number] Date of birth: Mar 03, 1926 Age: 82 y.o. Gender: female Date of Admission: 02/20/2016  Date of Discharge: 02/25/2016 Admitting Physician: Lupita Dawn, MD  Primary Care Provider: [name], MD Consultants: Neurology  Indication for Hospitalization:  s/p fall and olecranon fracture  Discharge Diagnoses/Problem List:  Right olecranon fracture Right cerebellar hemorrhage + hx of previous cerebellar infarcts CKD4 HLD HTN  Disposition: SNF  Discharge Condition: stable, improved  Discharge Exam:  General: NAD, rests comfortably, pleasant Cardiovascular: RRR, no m/r/g Respiratory: CTA bilaterally, no W/R/R, no increased work of breathing Abdomen: soft, nontender,nondistended, +BS Extremities: FROMx3, able to extend right arm with minimal pain due to olecranon injury, extremities warm and well-perfused, no edema Psych: mood appropriate  Brief Hospital Course:   Meredith L Pinnixis a 82 y.o.female with hx of previous cerebellar infarcts, CKD 4, HLD, HTN, vulvar cancer (s/p radiation), cystitis, arthritic knee pain, and previous hip fracture who was found to have a right-sided cerebellar hemorrhage.   Recent falls with right cerebellar hemorrhage MRI brain on 8/6 showed 23mm right cerebellar hemorrhage with mild surrounding edema, likely hypertensive in etiology, increased number of chronic microhemorrhages, consistent with chronic HTN, progressive chronic small vessel dx with lacunar infarcts. Patient was stable with no focal weakness or loss of sensation. No leukocytosis or fevers, TSH and B12 workup performed for concerns of memory loss and both levels normal. Neurology was consulted and recommended a permissible goal BP </= 180/90 during hospitalization and at 1-2 weeks follow up with goal of 140/90. No anticoagulants or antithrombotics given during admission. Q4 neuro  checks and BP checks were performed. PT was seen and recommended SNF or supervision/assitance 24h with use of a cane or hemiwalker given her history of falls. Patient continued to have normal neurologic exams and at time of discharge was at her baseline.   HTN with strict BP parameters given hemorrhagic stroke Patient was continued on home Verapamil and given prn hydralazine 5mg  as needed. Throughout her admission her blood pressures remained persistently high and her Hydralazine was increased from 50mg  TID to QID. She was discharged on this dosage to help better control her blood pressures. Goal BP as above of <180/90 and at follow up visit after 1-2 weeks <140/90. At time of discharge she was otherwise stable.   HLD Patient has history of HLD and had an LDL 123. Per neurology recommendation goal <70 and Lipitor 40mg  daily at time of discharge was discussed. It was not given while inpatient due to contraindication with acute intracranial hemorrhage.   CKD-IV Creatinine function around 2.4 on admission. Creatinine bumped up during hospitalization to and was monitored daily with BMP. Nephrotoxic medications were also avoided during her stay.  Olecranon fracture Patient also complaining of right elbow pain. Sustained a fracture of proximal aspect noted on x-ray 8/5.She was splinted in ED and placed in a sling. Home tramadol 50mg  prn was continued for her pain and she required a one time dose of Norco 8/6. Oscal supplementation started while inpatient given her history of fractures. Will continue upon discharge. Patient already taking Vit D at home. Patient will follow up with outpatient Ortho and PCP Dr. Nori Riis.  Issues for Follow Up:  -Will need to follow Ortho outpatient for olecranon avulsion -plan to discontinue splint per, Dr. Nori Riis, as to have good movement of the arm and maintain ROM -will require close follow up for blood pressure monitoring given her  high BP. Goal BP per neurology  recommendation of <180/90 while hospitalized and after 1-2 weeks at follow up <140/90.  -will be discharged home on Hydralazine 50mg  QID to better manage her blood pressures -Calcium supplementation started while inpatient given her history of fractures. Will need to continue daily upon discharge along with her Vitamin D she already takes. -Statin medication (Lipitor) prescribed per neuro recommendation once discharged from hospital -Will need supervision given her increased risk of falls (SNF)  Significant Procedures: None  Significant Labs and Imaging:   Recent Labs Lab 02/23/16 0527 02/24/16 0536 02/25/16 0500  WBC 6.7 6.1 5.6  HGB 11.8* 11.8* 12.0  HCT 38.2 37.5 38.4  PLT 255 259 262    Recent Labs Lab 02/21/16 0801 02/22/16 0455 02/23/16 0527 02/24/16 0536 02/25/16 0500  NA 140 141 140 139 140  K 4.0 4.2 4.0 4.1 3.9  CL 110 111 108 107 105  CO2 22 23 23 24 24   GLUCOSE 113* 95 93 92 93  BUN 30* 30* 31* 33* 35*  CREATININE 2.16* 2.38* 2.40* 2.65* 2.37*  CALCIUM 8.9 8.8* 9.0 8.8* 9.0    Results/Tests Pending at Time of Discharge: None  Discharge Medications:    Medication List    STOP taking these medications   fexofenadine 30 MG tablet Commonly known as:  ALLEGRA   silver sulfADIAZINE 1 % cream Commonly known as:  SILVADENE     TAKE these medications   atorvastatin 40 MG tablet Commonly known as:  LIPITOR Take 1 tablet (40 mg total) by mouth daily.   calcium carbonate 1250 (500 Ca) MG tablet Commonly known as:  OS-CAL - dosed in mg of elemental calcium Take 1 tablet (500 mg of elemental calcium total) by mouth daily with breakfast.   D3-1000 1000 units capsule Generic drug:  Cholecalciferol Take 2,000 Units by mouth daily.   hydrALAZINE 50 MG tablet Commonly known as:  APRESOLINE Take 1 tablet (50 mg total) by mouth 4 (four) times daily. What changed:  medication strength  how much to take  when to take this   loratadine 10 MG  tablet Commonly known as:  CLARITIN Take 1 tablet (10 mg total) by mouth daily.   omeprazole 20 MG capsule Commonly known as:  PRILOSEC Take 20 mg by mouth daily.   PROBIOTIC DAILY Caps florastar 250 mh take one by mouth daily   traMADol 50 MG tablet Commonly known as:  ULTRAM Take 1 tablet (50 mg total) by mouth every 8 (eight) hours. What changed:  how much to take  how to take this  when to take this  additional instructions   verapamil 240 MG 24 hr capsule Commonly known as:  VERELAN PM TAKE 1 CAPSULE BY MOUTH DAILY What changed:  See the new instructions.   vitamin E 400 UNIT capsule Generic drug:  vitamin E Take 400 Units by mouth daily.       Discharge Instructions: Please refer to Patient Instructions section of EMR for full details.  Patient was counseled important signs and symptoms that should prompt return to medical care, changes in medications, dietary instructions, activity restrictions, and follow up appointments.   Follow-Up Appointments:  Contact information for follow-up providers    [name], MD. Schedule an appointment as soon as possible for a visit in 2 day(s).   Specialties:  Family Medicine, Sports Medicine Why:  Please make appointment to see Dr. Nori Riis within 48 hours of discharge from hospital. Contact information: [address]  [place] [address] [phone]            Contact information for after-discharge care    Destination    [facility] .   Specialty:  [place] Contact information: [address] [place] [phone]                  Smiley Houseman, MD 02/25/2016, 1:13 PM PGY-1, [place]

## 2016-02-25 NOTE — Discharge Instructions (Signed)
You were admitted to the family medicine teaching service due to your recent fall and were found to have a small right sided cerebellar hemorrhage. We modified your blood pressure medications as well as treated your other chronic problems. On discharge, you will be taking Hydralazine 50 mg four times a day. We started a lipid medication for you called Lipitor which will help decrease your LDL. We also started Oscal, a calcium supplement, given your history of fractures. We would also like you to continue your Vitamin D at home. Her BP goal during admission is <180/90. After 1-2 weeks on follow up we would like it to be <140/90, per recommendation from Neurology.

## 2016-02-25 NOTE — Progress Notes (Signed)
Occupational Therapy Treatment Patient Details Name: Meredith Bender MRN: [id number] DOB: [birth year] Today's Date: 02/25/2016    History of present illness Meredith Bender is an 82 y.o. female presenting with unsteadiness, a left olecranon fracture s/p fall, and a small R cerebellar hemorrhage. PMH is significant for CKD 4, HLD, HTN, vulvar cancer (s/p radiation), cystitis, BPPV?, arthritic knee pain, previous hip fracture, and previous cerebellar infarcts.   OT comments  Pt improved this day  Follow Up Recommendations  SNF;Supervision/Assistance - 24 hour    Equipment Recommendations  Other (comment) (TBD next venue of care)    Recommendations for Other Services      Precautions / Restrictions Precautions Precautions: Fall       Mobility Bed Mobility               General bed mobility comments: pt sitting EOB  Transfers Overall transfer level: Needs assistance   Transfers: Sit to/from Stand;Stand Pivot Transfers Sit to Stand: Min guard Stand pivot transfers: Min guard                ADL Overall ADL's : Needs assistance/impaired Eating/Feeding: Set up;Sitting   Grooming: Standing;Supervision/safety   Upper Body Bathing: Set up   Lower Body Bathing: Sit to/from stand;Min guard   Upper Body Dressing : Sitting;Set up   Lower Body Dressing: Sit to/from stand;Minimal assistance   Toilet Transfer: Comfort height toilet;Ambulation;Min guard   Toileting- Clothing Manipulation and Hygiene: Sit to/from stand;Min guard                          Cognition   Behavior During Therapy: WFL for tasks assessed/performed Overall Cognitive Status: Within Functional Limits for tasks assessed                               General Comments  pt very agreeable to ADL and and working with OT    Pertinent Vitals/ Pain       Pain Assessment: No/denies pain  Home Living                                              Frequency  Min 2X/week     Progress Toward Goals  OT Goals(current goals can now be found in the care plan section)  Progress towards OT goals: Progressing toward goals     Plan         End of Session Equipment Utilized During Treatment: Other (comment) (sling RUE)   Activity Tolerance Patient tolerated treatment well   Patient Left in chair;with call bell/phone within reach   Nurse Communication Mobility status        Time: [id number] OT Time Calculation (min): 39 min  Charges: OT General Charges $OT Visit: 1 Procedure OT Treatments $Self Care/Home Management : 38-52 mins  Kehinde Bowdish, Thereasa Parkin 02/25/2016, 9:43 AM

## 2016-02-25 NOTE — Progress Notes (Signed)
Family Medicine Teaching Service Daily Progress Note Intern Pager: [phone]  Patient name: Meredith Bender Medical record number: [id number] Date of birth: 1925-08-26 Age: 82 y.o. Gender: female  Primary Care Provider: [name], MD Consultants: Neurology Code Status: Partial (intubate)  Pt Overview and Major Events to Date:  8/5 - Pt admitted after ~ 1 week of unsteadiness and falls x 2  Assessment and Plan: Kathee L Pinnixis a 82 y.o.female  with hx of previous cerebellar infarcts, CKD 4, HLD, HTN, vulvar cancer (s/p radiation), cystitis, BPPV?, arthritic knee pain, and previous hip fracture.  #Recent falls w/R cerebellar hemorrhage: Stable.  Pt continues to have neuro exam WNL this AM.  - per neurology, keep BP</= 180/90 is permissible due to cerebellar ICH - no anticoagulents or antithrombotics   - q4h neuro and BP checks - hydralazine PRN q4h - F/u PT recc SNF or supervision/assistance 24h, recc use of cane, hemiwalker - Given recent falls: UA with trace leukocytes and 6-30 WBC on add-on; Urine culture - multiple species present, suggest recollection. Patient asymptomatic at current time.   #HTN with strict BP parameters given hemorrhagic stroke:  - patient with verapamil 240 mg qd and PRN hydralazine 5mg  q4h - increased standing hydralazine to 50 mg QID -  BP goal of below 180/90 -D/C with Hydralazine 50mg  QID  #HLD -LDL 123, goal <70 - Neuro reccs: consider Lipitor 40mg  daily at time of d/c - contraindicated with acute intracranial hemorrhage  #CKD-IV: Cr ~ 2.37. Around 2.4 on this admission - Monitor renal function - Avoid nephrotoxic medications  #Olecranon fracture:Fracture of proximal aspect noted on x-ray 8/5.S/p splint and sling placement -minimal pain noted today on exam.  - Continue to manage pain with tramadol Q6prn , Tylenol prn - f/u with ortho outpatient and Dr. Nori Riis -Calcium supplementation Oscal started yesterday, given history of fractures. Will  continue upon discharge. Patient already taking Vit D at home. -Per Dr. Nori Riis, does not require the splint/sling. Need to preserve ROM in the arm.   FEN/GI: Heart Healthy Diet, prilosec Prophylaxis: SCDs  Disposition: SNF pending clinical improvement. Call for bed. CM and SW on case.  Subjective:  Patient feels well this morning, no complaints except minimal pain consistent with her olecranon fracture.   Objective: Temp:  [98.5 F (36.9 C)-98.6 F (37 C)] 98.6 F (37 C) (08/10 0602) Pulse Rate:  [75-91] 86 (08/10 0602) Resp:  [16] 16 (08/10 0602) BP: (167-190)/(57-76) 183/76 (08/10 0602) SpO2:  [95 %-98 %] 96 % (08/10 0602)   Physical Exam: General: NAD, resting comfortably Cardiovascular: RRR, no m/r/g Respiratory: CTA bilaterally, no W/R/R Abdomen: soft, nontender,nondistended Extremities: full ROM, warm and well-perfused  Laboratory:  Recent Labs Lab 02/23/16 0527 02/24/16 0536 02/25/16 0500  WBC 6.7 6.1 5.6  HGB 11.8* 11.8* 12.0  HCT 38.2 37.5 38.4  PLT 255 259 262    Recent Labs Lab 02/23/16 0527 02/24/16 0536 02/25/16 0500  NA 140 139 140  K 4.0 4.1 3.9  CL 108 107 105  CO2 23 24 24   BUN 31* 33* 35*  CREATININE 2.40* 2.65* 2.37*  CALCIUM 9.0 8.8* 9.0  GLUCOSE 93 92 93    Imaging/Diagnostic Tests:  No results found.   Lovenia Kim, MD 02/25/2016, 7:11 AM PGY-1, [place] Intern pager: [phone], text pages welcome

## 2016-02-25 NOTE — Clinical Social Work Placement (Addendum)
   CLINICAL SOCIAL WORK PLACEMENT  NOTE 02/25/16 - DISCHARGED TO [facility]  Date:  02/25/2016  Patient Details  Name: Meredith Bender MRN: [id number] Date of Birth: 01-18-1926  Clinical Social Work is seeking post-discharge placement for this patient at the [place] level of care (*CSW will initial, date and re-position this form in  chart as items are completed):  Yes   Patient/family provided with [place] Work Department's list of facilities offering this level of care within the geographic area requested by the patient (or if unable, by the patient's family).  Yes   Patient/family informed of their freedom to choose among providers that offer the needed level of care, that participate in Medicare, Medicaid or managed care program needed by the patient, have an available bed and are willing to accept the patient.  Yes   Patient/family informed of [facility]'s ownership interest in [facility] and [facility], as well as of the fact that they are under no obligation to receive care at these facilities.  PASRR submitted to EDS on       PASRR number received on       Existing PASRR number confirmed on 02/24/16     FL2 transmitted to all facilities in geographic area requested by pt/family on 02/24/16     FL2 transmitted to all facilities within larger geographic area on       Patient informed that his/her managed care company has contracts with or will negotiate with certain facilities, including the following:        Yes   Patient/family informed of bed offers received.  Patient [facility] bed at [facility]     Physician recommends and patient chooses bed at      Patient to be transferred to [facility] on 02/25/16.  Patient to be transferred to facility by Ambulance [facility])     Patient family notified on 02/25/16 of transfer.  Name of family member notified:  Daughter Rickie, at the bedside     PHYSICIAN       Additional  Comment:    _______________________________________________ Sable Feil, LCSW 02/25/2016, 1:18 PM

## 2016-02-29 ENCOUNTER — Non-Acute Institutional Stay (SKILLED_NURSING_FACILITY): Payer: Medicare Other | Admitting: Internal Medicine

## 2016-02-29 DIAGNOSIS — N184 Chronic kidney disease, stage 4 (severe): Secondary | ICD-10-CM

## 2016-02-29 DIAGNOSIS — I10 Essential (primary) hypertension: Secondary | ICD-10-CM | POA: Diagnosis not present

## 2016-02-29 DIAGNOSIS — K219 Gastro-esophageal reflux disease without esophagitis: Secondary | ICD-10-CM | POA: Diagnosis not present

## 2016-02-29 DIAGNOSIS — R531 Weakness: Secondary | ICD-10-CM | POA: Diagnosis not present

## 2016-02-29 DIAGNOSIS — I614 Nontraumatic intracerebral hemorrhage in cerebellum: Secondary | ICD-10-CM | POA: Diagnosis not present

## 2016-02-29 DIAGNOSIS — E785 Hyperlipidemia, unspecified: Secondary | ICD-10-CM | POA: Diagnosis not present

## 2016-02-29 DIAGNOSIS — S52021D Displaced fracture of olecranon process without intraarticular extension of right ulna, subsequent encounter for closed fracture with routine healing: Secondary | ICD-10-CM

## 2016-02-29 LAB — HEPATIC FUNCTION PANEL
ALK PHOS: 77 U/L (ref 25–125)
ALT: 13 U/L (ref 7–35)
AST: 28 U/L (ref 13–35)
Bilirubin, Total: 0.5 mg/dL

## 2016-02-29 LAB — BASIC METABOLIC PANEL
BUN: 36 mg/dL — AB (ref 4–21)
Creatinine: 2.5 mg/dL — AB (ref 0.5–1.1)
Glucose: 101 mg/dL
Potassium: 4.2 mmol/L (ref 3.4–5.3)
SODIUM: 141 mmol/L (ref 137–147)

## 2016-02-29 LAB — CBC AND DIFFERENTIAL
HEMATOCRIT: 40 % (ref 36–46)
HEMOGLOBIN: 12.8 g/dL (ref 12.0–16.0)
Platelets: 297 10*3/uL (ref 150–399)
WBC: 6.9 10^3/mL

## 2016-02-29 NOTE — Progress Notes (Signed)
LOCATION: [facility]  PCP: [name], MD   Code Status: Full Code   Goals of care: Advanced Directive information Advanced Directives 02/20/2016  Does patient have an advance directive? Yes  Type of Advance Directive [place]  Does patient want to make changes to advanced directive? No - Patient declined  Copy of advanced directive(s) in chart? -  Pre-existing out of facility DNR order (yellow form or pink MOST form) -       Extended Emergency Contact Information Primary Emergency Contact: Toye,Rickie Address: [address]          [place], [place] [address] Johnnette Litter of [place] Phone: [phone] Mobile Phone: [phone] Relation: Daughter Secondary Emergency Contact: Roanhorse,Bruce Address: [address]           [place], [place] [address] [place] of [place] Phone: [phone] Work Phone: [phone] Mobile Phone: [phone] Relation: Son   Allergies  Allergen Reactions  . Codeine Nausea Only  . Penicillins Rash    Pt does not remember how long ago or severity of reaction.    Chief Complaint  Patient presents with  . New Admit To SNF    HPI:  Patient is a 82 y.o. female seen today for short term rehabilitation post hospital admission from 02/20/16-02/25/16 post fall with right olecranon fracture and right cerebellar hemorrhage. She had a splint and a sling placed to her fractured arm in the ED. She was seen by neurology and BP control was recommended. She is seen in her room today. She denies any complaint. On chart review has few elevated bp reading > 180/90.   Review of Systems:  Constitutional: Negative for fever, chills, diaphoresis. Energy level is slowly coming back. HENT: Negative for headache, congestion, nasal discharge, difficulty swallowing.   Eyes: Negative for blurred vision, double vision and discharge.  Respiratory: Negative for cough, shortness of breath and wheezing.   Cardiovascular: Negative  for chest pain, palpitations, leg swelling.  Gastrointestinal: Negative for heartburn, nausea, vomiting, abdominal pain. Last bowel movement was 2 days ago.  Genitourinary: Negative for dysuria and flank pain.  Musculoskeletal: Negative for back pain, fall in the facility.  Skin: Negative for itching, rash.  Neurological: Negative for dizziness. Psychiatric/Behavioral: Negative for depression   Past Medical History:  Diagnosis Date  . Anginal pain ([place])   . Arthritis   . Chronic kidney disease   . COPD (chronic obstructive pulmonary disease) ([place])   . CVA (cerebral infarction)    Remote bilateral cerebellar infarcts on CT 8/14  . Dyslipidemia 02/28/2013  . Hypertension   . Intertrochanteric fracture of right femur ([place])   . Radiation 04/02/15-05/29/15   vulvar region, inguinal lymph node 60 Gu  . Stroke [facility]) 1978   no deficits  . Vulvar cancer [facility])    Past Surgical History:  Procedure Laterality Date  . ABDOMINAL HYSTERECTOMY    . APPENDECTOMY    . HERNIA REPAIR    . INTRAMEDULLARY (IM) NAIL INTERTROCHANTERIC Right 08/29/2013   Procedure: INTRAMEDULLARY (IM) NAIL INTERTROCHANTRIC;  Surgeon: Nita Sells, MD;  Location: [facility];  Service: Orthopedics;  Laterality: Right;  . TUBAL LIGATION     Social History:   reports that she has never smoked. She has never used smokeless tobacco. She reports that she does not drink alcohol or use drugs.  Family History  Problem Relation Age of Onset  . Diabetes Mother   . Uterine cancer Sister     Medications:  Medication List       Accurate as of 02/29/16  3:57 PM. Always use your most recent med list.          acetaminophen 325 MG tablet Commonly known as:  TYLENOL Take 650 mg by mouth every 6 (six) hours as needed for mild pain.   acetaminophen 325 MG tablet Commonly known as:  TYLENOL Take 650 mg by mouth every 4 (four) hours as needed for mild pain.   atorvastatin 40 MG tablet Commonly known as:   LIPITOR Take 1 tablet (40 mg total) by mouth daily.   calcium carbonate 1250 (500 Ca) MG tablet Commonly known as:  OS-CAL - dosed in mg of elemental calcium Take 1 tablet (500 mg of elemental calcium total) by mouth daily with breakfast.   D3-1000 1000 units capsule Generic drug:  Cholecalciferol Take 2,000 Units by mouth daily.   hydrALAZINE 50 MG tablet Commonly known as:  APRESOLINE Take 1 tablet (50 mg total) by mouth 4 (four) times daily.   loratadine 10 MG tablet Commonly known as:  CLARITIN Take 1 tablet (10 mg total) by mouth daily.   omeprazole 20 MG capsule Commonly known as:  PRILOSEC Take 20 mg by mouth daily.   saccharomyces boulardii 250 MG capsule Commonly known as:  FLORASTOR Take 250 mg by mouth daily.   traMADol 50 MG tablet Commonly known as:  ULTRAM Take 50-100 mg by mouth every 8 (eight) hours as needed for moderate pain.   verapamil 240 MG 24 hr capsule Commonly known as:  VERELAN PM TAKE 1 CAPSULE BY MOUTH DAILY   vitamin E 400 UNIT capsule Generic drug:  vitamin E Take 400 Units by mouth daily.       Immunizations: Immunization History  Administered Date(s) Administered  . Influenza, High Dose Seasonal PF 04/24/2015  . PPD Test 02/25/2016  . Pneumococcal Polysaccharide-23 01/01/2014     Physical Exam:  BP (!) 190/88   Pulse 84   Temp 97.2 F (36.2 C)   Resp 16   SpO2 96%   General- elderly female, well built, in no acute distress Head- normocephalic, atraumatic Nose- no nasal discharge Throat- moist mucus membrane Eyes- PERRLA, EOMI, no pallor, no icterus Neck- no cervical lymphadenopathy Cardiovascular- normal s1,s2, no murmur Respiratory- bilateral clear to auscultation, no wheeze, no rhonchi, no crackles, no use of accessory muscles Abdomen- bowel sounds present, soft, non tender Musculoskeletal- able to move all 4 extremities, generalized weakness, limited ROM with her right elbow Neurological- alert and oriented to  person, place and time Skin- warm and dry Psychiatry- normal mood and affect    Labs reviewed: Basic Metabolic Panel:  Recent Labs  02/23/16 0527 02/24/16 0536 02/25/16 0500  NA 140 139 140  K 4.0 4.1 3.9  CL 108 107 105  CO2 23 24 24   GLUCOSE 93 92 93  BUN 31* 33* 35*  CREATININE 2.40* 2.65* 2.37*  CALCIUM 9.0 8.8* 9.0   CBC:  Recent Labs  02/23/16 0527 02/24/16 0536 02/25/16 0500  WBC 6.7 6.1 5.6  HGB 11.8* 11.8* 12.0  HCT 38.2 37.5 38.4  MCV 90.5 89.5 89.1  PLT 255 259 262   Cardiac Enzymes:  Recent Labs  02/21/16 1450 02/21/16 2101 02/22/16 0455  TROPONINI 0.03* 0.04* 0.04*   BNP: Invalid input(s): POCBNP CBG:  Recent Labs  03/27/15 1310 02/20/16 1223  GLUCAP 90 107*    Radiological Exams: Dg Elbow Complete Right  Result Date: 02/20/2016 CLINICAL DATA:  Pain after fall  EXAM: RIGHT ELBOW - COMPLETE 3+ VIEW COMPARISON:  None. FINDINGS: There is a calcification just proximal to the olecranon process with acute angles, favored to represent an acute fracture off the olecranon process. No other bony or soft tissue abnormalities are identified. IMPRESSION: Acute fracture off the proximal aspect of the olecranon process. Electronically Signed   By: Dorise Bullion III M.D   On: 02/20/2016 15:44   Ct Head Wo Contrast  Result Date: 02/20/2016 CLINICAL DATA:  Status post fall. EXAM: CT HEAD WITHOUT CONTRAST TECHNIQUE: Contiguous axial images were obtained from the base of the skull through the vertex without intravenous contrast. COMPARISON:  None. FINDINGS: Paranasal sinuses, mastoid air cells, bones, and extracranial soft tissues are within normal limits. No subdural, epidural, or subarachnoid hemorrhage. There is a small rounded region of high attenuation in the right cerebellar hemisphere on series 2, image 7 measuring 6 mm with a halo of surrounding low attenuation. Remainder of the cerebellum is normal. The basal cisterns are patent. Moderate to severe  white matter changes are noted. A lacunar infarct in the right thalamus is age indeterminate. No other evidence of acute ischemia or infarct. No midline shift. Ventricles and sulci are normal for age. IMPRESSION: 1. 6 mm rounded region of high attenuation the right cerebellar hemisphere with surrounding low attenuation. This is concerning for a small hemorrhage with surrounding edema. The etiology is unclear. It would be an unusual site for trauma but a posttraumatic cause is not excluded. Hemorrhagic transformation of a small infarct is possible. Bleeding of an underlying vascular abnormality or mass is not excluded. A hypertensive bleed could also occur in this location. An MRI could better assess this region. 2. Age indeterminate lacunar infarct of the right thalamus. Findings called to Dr. Eulis Foster. Electronically Signed   By: Dorise Bullion III M.D   On: 02/20/2016 17:22   Mr Brain Wo Contrast  Result Date: 02/21/2016 CLINICAL DATA:  Cerebellar hemorrhage.  Dizziness.  Fall. EXAM: MRI HEAD WITHOUT CONTRAST TECHNIQUE: Multiplanar, multiecho pulse sequences of the brain and surrounding structures were obtained without intravenous contrast. COMPARISON:  Head CT 02/20/2016 and MRI 03/01/2013 FINDINGS: As seen on yesterday's head CT, there is an approximately 6 mm focus of hemorrhage in the medial right cerebellum with mild surrounding edema. There is no evidence of acute infarct separate from this hemorrhage. Chronic microhemorrhages are again seen in the pons and deep gray nuclei bilaterally and have increased in number from the prior MRI. A few microhemorrhages are also noted scattered more peripherally in the cerebral hemispheres. A small, chronic left cerebellar infarct is unchanged. There is a small, chronic infarct in the right pons which is new from the prior MRI. Chronic lacunar infarcts are again seen in the deep gray nuclei and cerebral white matter bilaterally. Cerebral atrophy is unchanged from the  prior MRI. Patchy to confluent T2 hyperintensities in the cerebral white matter have mildly progressed from the prior MRI and are compatible with extensive chronic small vessel ischemic disease. No mass, midline shift, or extra-axial fluid collection. Prior bilateral cataract extraction is noted. There is a trace right mastoid effusion. The paranasal sinuses are clear. Major intracranial vascular flow voids are preserved with intracranial arterial dolichoectasia again noted. IMPRESSION: 1. 6 mm right cerebellar hemorrhage with mild surrounding edema, likely hypertensive in etiology. 2. Increased number of chronic microhemorrhages, consistent with chronic hypertension. 3. Progressive chronic small vessel ischemic disease with lacunar infarcts as above. Electronically Signed   By: Seymour Bars.D.  On: 02/21/2016 12:49    Assessment/Plan  Generalized weakness Will have patient work with PT/OT as tolerated to regain strength and restore function.  Fall precautions are in place.  Right olecranon fracture Her splint and sling are now off. To work with PT and OT for ROM exercises and safe transfers. Will have her work with physical therapy and occupational therapy team to help with gait training and muscle strengthening exercises.fall precautions. Skin care. Encourage to be out of bed. Continue tylenol 650 mg q6h prn pain and d/c tylenol q4h prn order. Continue tramadol 50 mg 1-2 tab q8h prn for severe pain. Continue calcium and vitamin d  Right cerebellar hemorrhage Avoid antiplatelet agent and anticoagulation. Fall precautions and to have BP under control. Check cbc  HTN Has few elevated bp readings above goal of 180/90 at present. Currently on hydralazine 50 mg qid and verapamil 240 mg daily. Change her hydralazine to 75 mg qid for now with bp check q shift. Check bmp  HLD Continue her lipitor 40 mg daily  gerd Stable, continue prilosec 20 mg daily  ckd stage 4 Check bmp   Goals of care:  short term rehabilitation   Labs/tests ordered: cbc, cmp 03/01/16  Family/ staff Communication: reviewed care plan with patient and nursing supervisor    Blanchie Serve, MD Internal Medicine [place], [place] [address] Cell Phone (Monday-Friday 8 am - 5 pm): [phone] On Call: [phone] and follow prompts after 5 pm and on weekends Office Phone: [phone] Office Fax: [phone]

## 2016-03-01 ENCOUNTER — Non-Acute Institutional Stay (SKILLED_NURSING_FACILITY): Payer: Medicare Other | Admitting: Family

## 2016-03-01 ENCOUNTER — Telehealth: Payer: Self-pay | Admitting: Family Medicine

## 2016-03-01 ENCOUNTER — Encounter: Payer: Self-pay | Admitting: Family

## 2016-03-01 DIAGNOSIS — I10 Essential (primary) hypertension: Secondary | ICD-10-CM | POA: Diagnosis not present

## 2016-03-01 DIAGNOSIS — M545 Low back pain, unspecified: Secondary | ICD-10-CM

## 2016-03-01 DIAGNOSIS — M25562 Pain in left knee: Secondary | ICD-10-CM

## 2016-03-01 DIAGNOSIS — M25561 Pain in right knee: Secondary | ICD-10-CM

## 2016-03-01 NOTE — Progress Notes (Signed)
Patient ID: Meredith Bender, female   DOB: 1926/06/01, 82 y.o.   MRN: [id number]  Location:    [facility] Room Number: [id number] Place of Service:  SNF [id number]) Provider: Takeshia Wenk FNP-C   [name], MD  Patient Care Team: [name], MD as PCP - General (Family Medicine)  Extended Emergency Contact Information Primary Emergency Contact: Toye,Rickie Address: [address]          [place], [place] [address] Johnnette Litter of [place] Phone: [phone] Mobile Phone: [phone] Relation: Daughter Secondary Emergency Contact: Gulotta,Bruce Address: [address]           [place], [place] [address] Johnnette Litter of [place] Phone: [phone] Work Phone: [phone] Mobile Phone: [phone] Relation: Son  Code Status:  Full Code  Goals of care: Advanced Directive information Advanced Directives 03/01/2016  Does patient have an advance directive? Yes  Type of Advance Directive [place]  Does patient want to make changes to advanced directive? No - Patient declined  Copy of advanced directive(s) in chart? Yes  Pre-existing out of facility DNR order (yellow form or pink MOST form) -     Chief Complaint  Patient presents with  . Acute Visit    patient complains of pain and high blood pressure    HPI:  Pt is a 82 y.o. female seen today at [facility] for an acute visit for evaluation of pain and elevated B/P. She has a medical history of HTN, CKD, CVA, Arthritis among others. She is seen in her room today with her daughter at bedside. She complains of chronic back and knee pain states current pain regimen ineffective. Facility Nurse reports patient has had to request pain medication every 4 Hrs prior to due time. Patient's daughter states patient's pain is well control when tramadol is scheduled at least three times daily. Patient's SB/P ranging in 140's -180's. Though suspect due to pain. Patient's  request patient's diet changed to regular diet without any restrictions " she has about 20% function of her kidneys and 82 years old let her enjoy what ever life she has left".    Past Medical History:  Diagnosis Date  . Anginal pain ([place])   . Arthritis   . Chronic kidney disease   . COPD (chronic obstructive pulmonary disease) ([place])   . CVA (cerebral infarction)    Remote bilateral cerebellar infarcts on CT 8/14  . Dyslipidemia 02/28/2013  . Hypertension   . Intertrochanteric fracture of right femur ([place])   . Radiation 04/02/15-05/29/15   vulvar region, inguinal lymph node 60 Gu  . Stroke [facility]) 1978   no deficits  . Vulvar cancer [facility])    Past Surgical History:  Procedure Laterality Date  . ABDOMINAL HYSTERECTOMY    . APPENDECTOMY    . HERNIA REPAIR    . INTRAMEDULLARY (IM) NAIL INTERTROCHANTERIC Right 08/29/2013   Procedure: INTRAMEDULLARY (IM) NAIL INTERTROCHANTRIC;  Surgeon: Nita Sells, MD;  Location: [facility];  Service: Orthopedics;  Laterality: Right;  . TUBAL LIGATION      Allergies  Allergen Reactions  . Codeine Nausea Only  . Penicillins Rash    Pt does not remember how long ago or severity of reaction.       Medication List       Accurate as of 03/01/16  3:16 PM. Always use your most recent med list.          acetaminophen  325 MG tablet Commonly known as:  TYLENOL Take 650 mg by mouth every 6 (six) hours as needed for mild pain.   atorvastatin 40 MG tablet Commonly known as:  LIPITOR Take 1 tablet (40 mg total) by mouth daily.   calcium carbonate 1250 (500 Ca) MG tablet Commonly known as:  OS-CAL - dosed in mg of elemental calcium Take 1 tablet (500 mg of elemental calcium total) by mouth daily with breakfast.   D3-1000 1000 units capsule Generic drug:  Cholecalciferol Take 2,000 Units by mouth daily.   hydrALAZINE 25 MG tablet Commonly known as:  APRESOLINE Take 75 mg by mouth 4 (four) times daily. At 6am, 12pm, 5pm, 11pm   loratadine  10 MG tablet Commonly known as:  CLARITIN Take 1 tablet (10 mg total) by mouth daily.   omeprazole 20 MG capsule Commonly known as:  PRILOSEC Take 20 mg by mouth daily.   saccharomyces boulardii 250 MG capsule Commonly known as:  FLORASTOR Take 250 mg by mouth daily.   traMADol 50 MG tablet Commonly known as:  ULTRAM Take 50-100 mg by mouth every 8 (eight) hours as needed for moderate pain.   verapamil 240 MG 24 hr capsule Commonly known as:  VERELAN PM TAKE 1 CAPSULE BY MOUTH DAILY   vitamin E 400 UNIT capsule Generic drug:  vitamin E Take 400 Units by mouth daily.       Review of Systems  Constitutional: Negative for activity change, appetite change, chills and fever.  HENT: Negative for congestion, sinus pressure, sneezing and sore throat.   Respiratory: Negative for cough, chest tightness, shortness of breath and wheezing.   Cardiovascular: Negative for chest pain, palpitations and leg swelling.  Gastrointestinal: Negative for abdominal distention, abdominal pain, constipation, diarrhea, nausea and vomiting.  Genitourinary: Negative for dysuria, flank pain, frequency and urgency.  Musculoskeletal: Positive for back pain.       Knee pain   Skin: Negative for color change, pallor and rash.  Neurological: Negative for dizziness, seizures, syncope, light-headedness and headaches.  Psychiatric/Behavioral: Negative for agitation, confusion, hallucinations and sleep disturbance. The patient is not nervous/anxious.     Immunization History  Administered Date(s) Administered  . Influenza, High Dose Seasonal PF 04/24/2015  . PPD Test 02/25/2016  . Pneumococcal Polysaccharide-23 01/01/2014   Pertinent  Health Maintenance Due  Topic Date Due  . PNA vac Low Risk Adult (2 of 2 - PCV13) 01/02/2015  . INFLUENZA VACCINE  02/16/2016  . DEXA SCAN  Completed   Fall Risk  08/27/2015 06/04/2015 03/19/2015 02/25/2015 12/17/2014  Falls in the past year? No No No No No  Risk for fall due  to : - Impaired balance/gait - - -   Functional Status Survey:    Vitals:   03/01/16 1508  BP: (!) 193/101  Pulse: 95  Resp: 18  Temp: 97.9 F (36.6 C)  TempSrc: Oral  SpO2: 95%  Weight: 101 lb 9.6 oz (46.1 kg)  Height: 5\' 2"  (1.575 m)   Body mass index is 18.58 kg/m. Physical Exam  Constitutional: She is oriented to person, place, and time. She appears well-developed and well-nourished. No distress.  HENT:  Head: Normocephalic.  Mouth/Throat: Oropharynx is clear and moist. No oropharyngeal exudate.  Eyes: Conjunctivae and EOM are normal. Pupils are equal, round, and reactive to light. Right eye exhibits no discharge. Left eye exhibits no discharge. No scleral icterus.  Neck: Normal range of motion. No JVD present. No thyromegaly present.  Cardiovascular: Normal rate, regular rhythm,  normal heart sounds and intact distal pulses.  Exam reveals no gallop and no friction rub.   No murmur heard. Pulmonary/Chest: Effort normal and breath sounds normal. No respiratory distress. She has no wheezes. She has no rales.  Abdominal: Soft. Bowel sounds are normal. She exhibits no distension. There is no tenderness. There is no rebound and no guarding.  Musculoskeletal: She exhibits no edema, tenderness or deformity.  Limited ROM due to pain. Unsteady gait   Lymphadenopathy:    She has no cervical adenopathy.  Neurological: She is oriented to person, place, and time.  Skin: Skin is warm and dry. No rash noted. No erythema. No pallor.  Psychiatric: She has a normal mood and affect.    Labs reviewed:  Recent Labs  02/23/16 0527 02/24/16 0536 02/25/16 0500  NA 140 139 140  K 4.0 4.1 3.9  CL 108 107 105  CO2 23 24 24   GLUCOSE 93 92 93  BUN 31* 33* 35*  CREATININE 2.40* 2.65* 2.37*  CALCIUM 9.0 8.8* 9.0   No results for input(s): AST, ALT, ALKPHOS, BILITOT, PROT, ALBUMIN in the last 8760 hours.  Recent Labs  02/23/16 0527 02/24/16 0536 02/25/16 0500  WBC 6.7 6.1 5.6  HGB  11.8* 11.8* 12.0  HCT 38.2 37.5 38.4  MCV 90.5 89.5 89.1  PLT 255 259 262   Lab Results  Component Value Date   TSH 2.670 02/21/2016   Lab Results  Component Value Date   HGBA1C 5.4 02/24/2016   Lab Results  Component Value Date   CHOL 218 (H) 02/21/2016   HDL 73 02/21/2016   LDLCALC 123 (H) 02/21/2016   TRIG 109 02/21/2016   CHOLHDL 3.0 02/21/2016   Assessment/Plan HTN Elevated sys B/P in the 180's at times possible due to pain.continue to monitor B/P twice daily per her previous orders. Hydralazine dose adjusted by MD 02/29/2016.Continue to monitor.  Bilateral Knee pain  Current pain medication ineffective. Change Tramadol to 50 mg Tablet every 8 hours for pain. Continue with scheduled Tylenol.   Chronic Back Pain  Change Tramadol as above. Continue to monitor.    Family/ staff Communication: Reviewed plan of care with patient, Patient's daughter and facility Nurse supervisor  Labs/tests ordered: None

## 2016-03-01 NOTE — Telephone Encounter (Signed)
[name] from [facility] was calling regarding the fracture in pt's arm. She would like to know if the pt has range of motion restrictions or any weight bearing restrictions. When returning the call ask for [name] OT in [place]. Thanks! ep

## 2016-03-02 ENCOUNTER — Other Ambulatory Visit: Payer: Self-pay | Admitting: *Deleted

## 2016-03-02 MED ORDER — TRAMADOL HCL 50 MG PO TABS: ORAL_TABLET | ORAL | 5 refills | Status: DC

## 2016-03-02 NOTE — Telephone Encounter (Signed)
[facility] [phone] Fax: [phone]  

## 2016-03-03 NOTE — Telephone Encounter (Signed)
Dear [name] Team She has no specific restrictions other than to let pain be her guide. Avoid hyper-extension of elbow but otherwise nothing specific. Please let them know. THANKS! Meredith Bender

## 2016-03-03 NOTE — Telephone Encounter (Signed)
[name] from [facility] is calling to get an update on the patients arm and the range of motion restrictions and weight bearing restrictions. Please call her with orders. jw

## 2016-03-04 NOTE — Telephone Encounter (Signed)
Tried to call [name] back to give her the information below and the man that answered the phone transferred me and the phone only rang and there was no option to LVM.  If [name] calls back please give her the information below. Meredith Bender, Meredith Bender, [place]

## 2016-03-04 NOTE — Telephone Encounter (Signed)
Pt was advised. ep °

## 2016-03-14 ENCOUNTER — Encounter: Payer: Self-pay | Admitting: Family

## 2016-03-14 ENCOUNTER — Non-Acute Institutional Stay (SKILLED_NURSING_FACILITY): Payer: Medicare Other | Admitting: Family

## 2016-03-14 DIAGNOSIS — M25562 Pain in left knee: Secondary | ICD-10-CM

## 2016-03-14 DIAGNOSIS — I614 Nontraumatic intracerebral hemorrhage in cerebellum: Secondary | ICD-10-CM | POA: Diagnosis not present

## 2016-03-14 DIAGNOSIS — M25561 Pain in right knee: Secondary | ICD-10-CM

## 2016-03-14 DIAGNOSIS — S52021D Displaced fracture of olecranon process without intraarticular extension of right ulna, subsequent encounter for closed fracture with routine healing: Secondary | ICD-10-CM

## 2016-03-14 DIAGNOSIS — R531 Weakness: Secondary | ICD-10-CM

## 2016-03-14 DIAGNOSIS — N184 Chronic kidney disease, stage 4 (severe): Secondary | ICD-10-CM | POA: Insufficient documentation

## 2016-03-14 DIAGNOSIS — I129 Hypertensive chronic kidney disease with stage 1 through stage 4 chronic kidney disease, or unspecified chronic kidney disease: Secondary | ICD-10-CM

## 2016-03-14 NOTE — Progress Notes (Signed)
Patient ID: Meredith Bender, female   DOB: Sep 05, 1925, 82 y.o.   MRN: [id number]    Location:   [facility] Room Number: [id number] Place of Service:  SNF [id number])  Provider: Marlowe Sax, FNP-C  PCP: [name], MD Patient Care Team: [name], MD as PCP - General (Family Medicine)  Extended Emergency Contact Information Primary Emergency Contact: Toye,Rickie Address: [address]          [place], [place] [address] [place] Phone: [phone] Mobile Phone: [phone] Relation: Daughter Secondary Emergency Contact: Schellhase,Bruce Address: [address]           [place], [place] [address] [place] Phone: [phone] Work Phone: [phone] Mobile Phone: [phone] Relation: Son  Code Status: Full Code Goals of care:  Advanced Directive information Advanced Directives 03/14/2016  Does patient have an advance directive? Yes  Type of Advance Directive [place]  Does patient want to make changes to advanced directive? No - Patient declined  Copy of advanced directive(s) in chart? Yes  Pre-existing out of facility DNR order (yellow form or pink MOST form) -     Allergies  Allergen Reactions  . Codeine Nausea Only  . Penicillins Rash    Pt does not remember how long ago or severity of reaction.     Chief Complaint  Patient presents with  . Discharge Note    Discharge from facility    HPI:  82 y.o. female  Seen today at  [facility] for discharge home. She was here for short term rehabilitation post hospital admission from 02/20/16-02/25/16 post fall with right olecranon fracture and right cerebellar hemorrhage. She had a splint and a sling placed to her fractured arm in the ED. She was seen by neurology and BP control was recommended. She is seen in her room today. Her Hydralazine was adjusted with much improvement in her B/P. She denies any acute issues this visit. She has worked  well with PT/OT now stable for discharge home.She will be discharged home with Home health PT/OT to continue with ROM, Exercise, Gait stability and muscle strengthening. She does not require any will any DME. Home health services will be arranged by facility social worker prior to discharge. Prescription medication will be written x 1 month then patient to follow up with PCP in 1-2 weeks. Facility staff report no new concerns.    Past Medical History:  Diagnosis Date  . Anginal pain ([place])   . Arthritis   . Chronic kidney disease   . COPD (chronic obstructive pulmonary disease) ([place])   . CVA (cerebral infarction)    Remote bilateral cerebellar infarcts on CT 8/14  . Dyslipidemia 02/28/2013  . Hypertension   . Intertrochanteric fracture of right femur ([place])   . Radiation 04/02/15-05/29/15   vulvar region, inguinal lymph node 60 Gu  . Stroke [facility]) 1978   no deficits  . Vulvar cancer [facility])     Past Surgical History:  Procedure Laterality Date  . ABDOMINAL HYSTERECTOMY    . APPENDECTOMY    . HERNIA REPAIR    . INTRAMEDULLARY (IM) NAIL INTERTROCHANTERIC Right 08/29/2013   Procedure: INTRAMEDULLARY (IM) NAIL INTERTROCHANTRIC;  Surgeon: Nita Sells, MD;  Location: [facility];  Service: Orthopedics;  Laterality: Right;  . TUBAL LIGATION        reports that she has never smoked. She has never used smokeless tobacco. She reports that she does not  drink alcohol or use drugs. Social History   Social History  . Marital status: Married    Spouse name: N/A  . Number of children: 4  . Years of education: N/A   Occupational History  . Not on file.   Social History Main Topics  . Smoking status: Never Smoker  . Smokeless tobacco: Never Used  . Alcohol use No  . Drug use: No  . Sexual activity: Not on file   Other Topics Concern  . Not on file   Social History Narrative  . No narrative on file      Allergies  Allergen Reactions  . Codeine Nausea Only  . Penicillins Rash      Pt does not remember how long ago or severity of reaction.     Pertinent  Health Maintenance Due  Topic Date Due  . INFLUENZA VACCINE  02/16/2016  . PNA vac Low Risk Adult (2 of 2 - PCV13) 03/14/2017 (Originally 01/02/2015)  . DEXA SCAN  Completed    Medications:   Medication List       Accurate as of 03/14/16  4:13 PM. Always use your most recent med list.          acetaminophen 325 MG tablet Commonly known as:  TYLENOL Take 650 mg by mouth every 6 (six) hours as needed for mild pain.   atorvastatin 40 MG tablet Commonly known as:  LIPITOR Take 1 tablet (40 mg total) by mouth daily.   calcium carbonate 1250 (500 Ca) MG tablet Commonly known as:  OS-CAL - dosed in mg of elemental calcium Take 1 tablet (500 mg of elemental calcium total) by mouth daily with breakfast.   D3-1000 1000 units capsule Generic drug:  Cholecalciferol Take 2,000 Units by mouth daily.   hydrALAZINE 25 MG tablet Commonly known as:  APRESOLINE Take 75 mg by mouth 4 (four) times daily. At 6am, 12pm, 5pm, 11pm   loratadine 10 MG tablet Commonly known as:  CLARITIN Take 1 tablet (10 mg total) by mouth daily.   Melatonin 300 MCG Tabs Take 1 tablet by mouth at bedtime.   omeprazole 20 MG capsule Commonly known as:  PRILOSEC Take 20 mg by mouth daily.   saccharomyces boulardii 250 MG capsule Commonly known as:  FLORASTOR Take 250 mg by mouth daily.   traMADol 50 MG tablet Commonly known as:  ULTRAM Take one tablet by mouth every eight hours for pain   verapamil 240 MG 24 hr capsule Commonly known as:  VERELAN PM TAKE 1 CAPSULE BY MOUTH DAILY   vitamin E 400 UNIT capsule Generic drug:  vitamin E Take 400 Units by mouth daily.       Review of Systems  Constitutional: Negative for activity change, appetite change, chills and fever.  HENT: Negative for congestion, sinus pressure, sneezing and sore throat.   Eyes: Negative.   Respiratory: Negative for cough, chest tightness,  shortness of breath and wheezing.   Cardiovascular: Negative for chest pain, palpitations and leg swelling.  Gastrointestinal: Negative for abdominal distention, abdominal pain, constipation, diarrhea, nausea and vomiting.  Endocrine: Negative.   Genitourinary: Negative for dysuria, flank pain, frequency and urgency.  Musculoskeletal: Positive for gait problem.       Knee pain   Skin: Negative for color change, pallor and rash.  Neurological: Negative for dizziness, seizures, syncope, light-headedness and headaches.  Psychiatric/Behavioral: Negative for agitation, confusion, hallucinations and sleep disturbance. The patient is not nervous/anxious.     Vitals:   03/14/16  1024  BP: (!) 142/82  Pulse: 82  Resp: 18  Temp: (!) 96.8 F (36 C)  TempSrc: Oral  SpO2: 94%  Weight: 102 lb 8 oz (46.5 kg)  Height: 5\' 2"  (1.575 m)   Body mass index is 18.75 kg/m. Physical Exam  Constitutional: She is oriented to person, place, and time. She appears well-developed and well-nourished. No distress.  HENT:  Head: Normocephalic.  Mouth/Throat: Oropharynx is clear and moist. No oropharyngeal exudate.  Eyes: Conjunctivae and EOM are normal. Pupils are equal, round, and reactive to light. Right eye exhibits no discharge. Left eye exhibits no discharge. No scleral icterus.  Neck: Normal range of motion. No JVD present. No thyromegaly present.  Cardiovascular: Normal rate, regular rhythm, normal heart sounds and intact distal pulses.  Exam reveals no gallop and no friction rub.   No murmur heard. Pulmonary/Chest: Effort normal and breath sounds normal. No respiratory distress. She has no wheezes. She has no rales.  Abdominal: Soft. Bowel sounds are normal. She exhibits no distension. There is no tenderness. There is no rebound and no guarding.  Musculoskeletal: She exhibits no edema, tenderness or deformity.  Limited ROM due to pain. Unsteady gait   Lymphadenopathy:    She has no cervical adenopathy.   Neurological: She is oriented to person, place, and time.  Skin: Skin is warm and dry. No rash noted. No erythema. No pallor.  Psychiatric: She has a normal mood and affect.    Labs reviewed: Basic Metabolic Panel:  Recent Labs  02/23/16 0527 02/24/16 0536 02/25/16 0500 02/29/16  NA 140 139 140 141  K 4.0 4.1 3.9 4.2  CL 108 107 105  --   CO2 23 24 24   --   GLUCOSE 93 92 93  --   BUN 31* 33* 35* 36*  CREATININE 2.40* 2.65* 2.37* 2.5*  CALCIUM 9.0 8.8* 9.0  --    Liver Function Tests:  Recent Labs  02/29/16  AST 28  ALT 13  ALKPHOS 77   CBC:  Recent Labs  02/23/16 0527 02/24/16 0536 02/25/16 0500 02/29/16  WBC 6.7 6.1 5.6 6.9  HGB 11.8* 11.8* 12.0 12.8  HCT 38.2 37.5 38.4 40  MCV 90.5 89.5 89.1  --   PLT 255 259 262 297   Cardiac Enzymes:  Recent Labs  02/21/16 1450 02/21/16 2101 02/22/16 0455  TROPONINI 0.03* 0.04* 0.04*    Recent Labs  03/27/15 1310 02/20/16 1223  GLUCAP 90 107*    Assessment/Plan:   HTN/CKD stage 4  B/p stable. Hydralazine adjusted to 75 mg Tablet every 6 hours during her rehab stay.continue Hydralazine and Verapamil. BMP in 1-2 weeks with PCP.   Intracerebral Hemorrhage Cerebellum Continue fall and safety precautions. CBC in 1-2 weeks with PCP    Right Olecranon Fracture  S/p short term Rehab. Will discharge Home with PT/OT to continue with exercises and muscle strengthening.   Knee pain  Continue current regimen.   Generalized Weakness  Has improved with PT/OT . Continue with ROM, Exercise, Gait stability and muscle strengthening. She does not require any will any DME.  CKD stage 4 BMP in 1-2 weeks with PCP   Patient is being discharged with the following home health services:    with ROM, Exercise, Gait stability and muscle strengthening.   Patient is being discharged with the following durable medical equipment:   She does not require any will any DME.   Patient has been advised to f/u with their PCP in  1-2 weeks  to bring them up to date on their rehab stay.  Social services at facility was responsible for arranging this appointment.  Pt was provided with a 30 day supply of prescriptions for medications and refills must be obtained from their PCP.  For controlled substances, a more limited supply may be provided adequate until PCP appointment only.  Future labs/tests needed: CBC, BMP in 1-2 weeks with PCP

## 2016-03-18 DIAGNOSIS — M6281 Muscle weakness (generalized): Secondary | ICD-10-CM | POA: Diagnosis not present

## 2016-03-18 DIAGNOSIS — Z8673 Personal history of transient ischemic attack (TIA), and cerebral infarction without residual deficits: Secondary | ICD-10-CM | POA: Diagnosis not present

## 2016-03-18 DIAGNOSIS — Z9181 History of falling: Secondary | ICD-10-CM | POA: Diagnosis not present

## 2016-03-18 DIAGNOSIS — M1990 Unspecified osteoarthritis, unspecified site: Secondary | ICD-10-CM | POA: Diagnosis not present

## 2016-03-18 DIAGNOSIS — M25562 Pain in left knee: Secondary | ICD-10-CM | POA: Diagnosis not present

## 2016-03-18 DIAGNOSIS — S52021D Displaced fracture of olecranon process without intraarticular extension of right ulna, subsequent encounter for closed fracture with routine healing: Secondary | ICD-10-CM | POA: Diagnosis not present

## 2016-03-18 DIAGNOSIS — E785 Hyperlipidemia, unspecified: Secondary | ICD-10-CM | POA: Diagnosis not present

## 2016-03-18 DIAGNOSIS — N184 Chronic kidney disease, stage 4 (severe): Secondary | ICD-10-CM | POA: Diagnosis not present

## 2016-03-18 DIAGNOSIS — Z8781 Personal history of (healed) traumatic fracture: Secondary | ICD-10-CM | POA: Diagnosis not present

## 2016-03-18 DIAGNOSIS — I129 Hypertensive chronic kidney disease with stage 1 through stage 4 chronic kidney disease, or unspecified chronic kidney disease: Secondary | ICD-10-CM | POA: Diagnosis not present

## 2016-03-18 DIAGNOSIS — M25561 Pain in right knee: Secondary | ICD-10-CM | POA: Diagnosis not present

## 2016-03-18 DIAGNOSIS — Z87440 Personal history of urinary (tract) infections: Secondary | ICD-10-CM | POA: Diagnosis not present

## 2016-03-18 DIAGNOSIS — J449 Chronic obstructive pulmonary disease, unspecified: Secondary | ICD-10-CM | POA: Diagnosis not present

## 2016-03-18 DIAGNOSIS — R296 Repeated falls: Secondary | ICD-10-CM | POA: Diagnosis not present

## 2016-03-19 DIAGNOSIS — S52021D Displaced fracture of olecranon process without intraarticular extension of right ulna, subsequent encounter for closed fracture with routine healing: Secondary | ICD-10-CM | POA: Diagnosis not present

## 2016-03-19 DIAGNOSIS — M25562 Pain in left knee: Secondary | ICD-10-CM | POA: Diagnosis not present

## 2016-03-19 DIAGNOSIS — N184 Chronic kidney disease, stage 4 (severe): Secondary | ICD-10-CM | POA: Diagnosis not present

## 2016-03-19 DIAGNOSIS — R296 Repeated falls: Secondary | ICD-10-CM | POA: Diagnosis not present

## 2016-03-19 DIAGNOSIS — M25561 Pain in right knee: Secondary | ICD-10-CM | POA: Diagnosis not present

## 2016-03-19 DIAGNOSIS — I129 Hypertensive chronic kidney disease with stage 1 through stage 4 chronic kidney disease, or unspecified chronic kidney disease: Secondary | ICD-10-CM | POA: Diagnosis not present

## 2016-03-22 DIAGNOSIS — S52021D Displaced fracture of olecranon process without intraarticular extension of right ulna, subsequent encounter for closed fracture with routine healing: Secondary | ICD-10-CM | POA: Diagnosis not present

## 2016-03-22 DIAGNOSIS — R296 Repeated falls: Secondary | ICD-10-CM | POA: Diagnosis not present

## 2016-03-22 DIAGNOSIS — I129 Hypertensive chronic kidney disease with stage 1 through stage 4 chronic kidney disease, or unspecified chronic kidney disease: Secondary | ICD-10-CM | POA: Diagnosis not present

## 2016-03-22 DIAGNOSIS — M25561 Pain in right knee: Secondary | ICD-10-CM | POA: Diagnosis not present

## 2016-03-22 DIAGNOSIS — N184 Chronic kidney disease, stage 4 (severe): Secondary | ICD-10-CM | POA: Diagnosis not present

## 2016-03-22 DIAGNOSIS — M25562 Pain in left knee: Secondary | ICD-10-CM | POA: Diagnosis not present

## 2016-03-23 ENCOUNTER — Ambulatory Visit (INDEPENDENT_AMBULATORY_CARE_PROVIDER_SITE_OTHER): Payer: Medicare Other | Admitting: Family Medicine

## 2016-03-23 DIAGNOSIS — Z598 Other problems related to housing and economic circumstances: Secondary | ICD-10-CM | POA: Diagnosis not present

## 2016-03-23 DIAGNOSIS — S52021D Displaced fracture of olecranon process without intraarticular extension of right ulna, subsequent encounter for closed fracture with routine healing: Secondary | ICD-10-CM | POA: Diagnosis not present

## 2016-03-23 DIAGNOSIS — E785 Hyperlipidemia, unspecified: Secondary | ICD-10-CM | POA: Diagnosis not present

## 2016-03-23 DIAGNOSIS — Z23 Encounter for immunization: Secondary | ICD-10-CM | POA: Diagnosis not present

## 2016-03-23 DIAGNOSIS — N184 Chronic kidney disease, stage 4 (severe): Secondary | ICD-10-CM | POA: Diagnosis not present

## 2016-03-23 DIAGNOSIS — S06360S Traumatic hemorrhage of cerebrum, unspecified, without loss of consciousness, sequela: Secondary | ICD-10-CM | POA: Diagnosis not present

## 2016-03-23 DIAGNOSIS — S52021A Displaced fracture of olecranon process without intraarticular extension of right ulna, initial encounter for closed fracture: Secondary | ICD-10-CM | POA: Diagnosis present

## 2016-03-23 DIAGNOSIS — I129 Hypertensive chronic kidney disease with stage 1 through stage 4 chronic kidney disease, or unspecified chronic kidney disease: Secondary | ICD-10-CM | POA: Diagnosis not present

## 2016-03-23 DIAGNOSIS — Z5989 Other problems related to housing and economic circumstances: Secondary | ICD-10-CM

## 2016-03-23 MED ORDER — HYDRALAZINE HCL 25 MG PO TABS: 25.0000 mg | ORAL_TABLET | Freq: Three times a day (TID) | ORAL | Status: DC

## 2016-03-23 MED ORDER — TRIAMCINOLONE ACETONIDE 0.1 % EX CREA: TOPICAL_CREAM | CUTANEOUS | 3 refills | Status: DC

## 2016-03-23 NOTE — Assessment & Plan Note (Signed)
She has regained full motion of her elbow. There is no swelling, no tenderness. Fracture has healed totally without sequela.

## 2016-03-23 NOTE — Assessment & Plan Note (Signed)
See notes under hypertension and hyperlipidemia for decisions regarding her blood pressure control, discontinuation of her atorvastatin

## 2016-03-23 NOTE — Assessment & Plan Note (Signed)
While she was at the rehabilitation facility they really tried to decrease her blood pressure and have her on pretty high dose of hydralazine. I think this is probably contributing to her slight unsteadiness and I don't think there is anything to gain by getting the off of blood pressure and his 82 year old. She has had a recent stroke and some could argue that better blood pressure control might prevent future stroke while others would argue that were permissive hypertension would be beneficial at this age with the elasticity of her vessels.  I discussed with her and her daughter and I think we will go for the permissive range of 160-165 as ideal and 170 or 175 as too high. With that in mind and with the suspicion that the increased hydralazine is partially to blame for her skin reaction in sun exposed areas we will decrease her hydralazine back to 25 mg 3 times a day which she had been on prior to hospitalization.

## 2016-03-23 NOTE — Assessment & Plan Note (Signed)
She is happy to be back home. Her daughter is not that happy that she is living alone. Her daughters checking on her twice a day. I do think it will be beneficial for her to have a walker at home to use particularly early in the morning and when she gets fatigued and we discussed this at length. She has clearly stated to me that she would rather have more quality of time than quantity of time so I think living in her own place is her greatest desire. Also she has her cat, Laurey Arrow, there.

## 2016-03-23 NOTE — Progress Notes (Signed)
    CHIEF COMPLAINT / HPI:   Here with her daughter for follow-up recent release from rehabilitation/nursing home after her CVA. She reports feeling well. Food does not taste quite as it isn't used to although her appetites about the same. She feels a little more unsteady when she's walking and says her balance has not quite come back but otherwise she feels pretty much back to baseline. She is very very happy to the back home with her cat, Meredith Bender.  Her daughter wants to discuss her medicines which they changed quite a bit while she was in the rehabilitation facility. Most notably they increased her hydralazine quite a bit. She also notes her mom's had an itchy rash on her arms in the posterior part of her neck for the last 2 or 3 days. They've put some hydrocortisone on it which helps some. She's never had a rash like this before. She has quite a few questions about medications.  She somehow never got her walker. The daughter reports they were also having trouble getting occupational health set up at home but she thinks she's finally gotten all that arranged and thinks there is a quarter Artie at home health for the walker.  REVIEW OF SYSTEMS:  See history of present illness. Additional pertinent review of systems is negative for chest pain, negative for shortness of breath, negative for lower extremity swelling. She feels her mentation is back to baseline. Her daughter says she keeps forgetting to take her pills on time.  OBJECTIVE:  Vital signs are reviewed.   Vital signs reviewed. GENERAL: Well-developed, well-nourished, no acute distress. CARDIOVASCULAR: Regular rate and rhythm no murmur gallop or rub LUNGS: Clear to auscultation bilaterally, no rales or wheeze. ABDOMEN: Soft positive bowel sounds NEURO: No gross focal neurological deficits. Individual cranial nerves were not examined. MSK: Movement of extremity x 4. She is able to rise from a chair without assistance. She is able to walk  without any aid from assistive devices or others. He looks very balance today and I not detect any imbalance in her gait except some slight unsteadiness when she first stands from a seated position. Once she is up and moving, she seems to be pretty steady but I didn't watch her walk more than 10 feet. Psych: Alert and oriented 4. Interactive. She asks and answers questions appropriate. Speech is normal and fluency in content. Denies hallucination. She is neatly dressed and has good eye contact.    ASSESSMENT / PLAN: Please see problem oriented charting for details

## 2016-03-23 NOTE — Assessment & Plan Note (Signed)
At her age and not sure what is going to be gained by lipid-lowering drugs. She's having quite a bit of problem with the multiple medications so with the intent of simplifying her regimen and precluding unintentional miss dosing of medications we will discontinue the atorvastatin.

## 2016-03-23 NOTE — Patient Instructions (Signed)
We changed your medicines back to something more simple. Glad you are doing so well.

## 2016-03-24 DIAGNOSIS — R296 Repeated falls: Secondary | ICD-10-CM | POA: Diagnosis not present

## 2016-03-24 DIAGNOSIS — S52021D Displaced fracture of olecranon process without intraarticular extension of right ulna, subsequent encounter for closed fracture with routine healing: Secondary | ICD-10-CM | POA: Diagnosis not present

## 2016-03-24 DIAGNOSIS — M25561 Pain in right knee: Secondary | ICD-10-CM | POA: Diagnosis not present

## 2016-03-24 DIAGNOSIS — I129 Hypertensive chronic kidney disease with stage 1 through stage 4 chronic kidney disease, or unspecified chronic kidney disease: Secondary | ICD-10-CM | POA: Diagnosis not present

## 2016-03-24 DIAGNOSIS — N184 Chronic kidney disease, stage 4 (severe): Secondary | ICD-10-CM | POA: Diagnosis not present

## 2016-03-24 DIAGNOSIS — M25562 Pain in left knee: Secondary | ICD-10-CM | POA: Diagnosis not present

## 2016-03-25 ENCOUNTER — Other Ambulatory Visit: Payer: Self-pay | Admitting: Family Medicine

## 2016-03-25 DIAGNOSIS — I129 Hypertensive chronic kidney disease with stage 1 through stage 4 chronic kidney disease, or unspecified chronic kidney disease: Secondary | ICD-10-CM | POA: Diagnosis not present

## 2016-03-25 DIAGNOSIS — N184 Chronic kidney disease, stage 4 (severe): Secondary | ICD-10-CM | POA: Diagnosis not present

## 2016-03-25 DIAGNOSIS — R269 Unspecified abnormalities of gait and mobility: Secondary | ICD-10-CM | POA: Insufficient documentation

## 2016-03-25 DIAGNOSIS — M25562 Pain in left knee: Secondary | ICD-10-CM | POA: Diagnosis not present

## 2016-03-25 DIAGNOSIS — M25561 Pain in right knee: Secondary | ICD-10-CM | POA: Diagnosis not present

## 2016-03-25 DIAGNOSIS — S52021D Displaced fracture of olecranon process without intraarticular extension of right ulna, subsequent encounter for closed fracture with routine healing: Secondary | ICD-10-CM | POA: Diagnosis not present

## 2016-03-25 DIAGNOSIS — R296 Repeated falls: Secondary | ICD-10-CM | POA: Diagnosis not present

## 2016-03-26 DIAGNOSIS — R296 Repeated falls: Secondary | ICD-10-CM | POA: Diagnosis not present

## 2016-03-26 DIAGNOSIS — S52021D Displaced fracture of olecranon process without intraarticular extension of right ulna, subsequent encounter for closed fracture with routine healing: Secondary | ICD-10-CM | POA: Diagnosis not present

## 2016-03-26 DIAGNOSIS — M25561 Pain in right knee: Secondary | ICD-10-CM | POA: Diagnosis not present

## 2016-03-26 DIAGNOSIS — N184 Chronic kidney disease, stage 4 (severe): Secondary | ICD-10-CM | POA: Diagnosis not present

## 2016-03-26 DIAGNOSIS — M25562 Pain in left knee: Secondary | ICD-10-CM | POA: Diagnosis not present

## 2016-03-26 DIAGNOSIS — I129 Hypertensive chronic kidney disease with stage 1 through stage 4 chronic kidney disease, or unspecified chronic kidney disease: Secondary | ICD-10-CM | POA: Diagnosis not present

## 2016-03-28 ENCOUNTER — Telehealth: Payer: Self-pay | Admitting: Family Medicine

## 2016-03-28 ENCOUNTER — Ambulatory Visit: Payer: Medicare Other | Attending: Gynecologic Oncology | Admitting: Gynecologic Oncology

## 2016-03-28 ENCOUNTER — Encounter: Payer: Self-pay | Admitting: Gynecologic Oncology

## 2016-03-28 VITALS — BP 188/83 | HR 78 | Temp 97.6°F | Resp 18 | Ht 62.0 in | Wt 103.7 lb

## 2016-03-28 DIAGNOSIS — C519 Malignant neoplasm of vulva, unspecified: Secondary | ICD-10-CM | POA: Diagnosis not present

## 2016-03-28 DIAGNOSIS — Z79899 Other long term (current) drug therapy: Secondary | ICD-10-CM | POA: Diagnosis not present

## 2016-03-28 DIAGNOSIS — Z880 Allergy status to penicillin: Secondary | ICD-10-CM | POA: Diagnosis not present

## 2016-03-28 DIAGNOSIS — I10 Essential (primary) hypertension: Secondary | ICD-10-CM | POA: Diagnosis not present

## 2016-03-28 DIAGNOSIS — Z90710 Acquired absence of both cervix and uterus: Secondary | ICD-10-CM | POA: Insufficient documentation

## 2016-03-28 DIAGNOSIS — Z8673 Personal history of transient ischemic attack (TIA), and cerebral infarction without residual deficits: Secondary | ICD-10-CM | POA: Insufficient documentation

## 2016-03-28 DIAGNOSIS — N898 Other specified noninflammatory disorders of vagina: Secondary | ICD-10-CM | POA: Insufficient documentation

## 2016-03-28 DIAGNOSIS — R590 Localized enlarged lymph nodes: Secondary | ICD-10-CM | POA: Insufficient documentation

## 2016-03-28 NOTE — Telephone Encounter (Signed)
Verbal order given via phone.

## 2016-03-28 NOTE — Patient Instructions (Signed)
Plan to follow up with Dr [name] in January 2018 , call us in November or December to schedule your appointment . Please call with any changes , questions or concerns.  Thank you

## 2016-03-28 NOTE — Telephone Encounter (Signed)
Ot with [facility]: needs verbal orders for OT service for once a week for 4 weeks.

## 2016-03-29 DIAGNOSIS — M25561 Pain in right knee: Secondary | ICD-10-CM | POA: Diagnosis not present

## 2016-03-29 DIAGNOSIS — R296 Repeated falls: Secondary | ICD-10-CM | POA: Diagnosis not present

## 2016-03-29 DIAGNOSIS — I129 Hypertensive chronic kidney disease with stage 1 through stage 4 chronic kidney disease, or unspecified chronic kidney disease: Secondary | ICD-10-CM | POA: Diagnosis not present

## 2016-03-29 DIAGNOSIS — N184 Chronic kidney disease, stage 4 (severe): Secondary | ICD-10-CM | POA: Diagnosis not present

## 2016-03-29 DIAGNOSIS — S52021D Displaced fracture of olecranon process without intraarticular extension of right ulna, subsequent encounter for closed fracture with routine healing: Secondary | ICD-10-CM | POA: Diagnosis not present

## 2016-03-29 DIAGNOSIS — M25562 Pain in left knee: Secondary | ICD-10-CM | POA: Diagnosis not present

## 2016-03-30 ENCOUNTER — Telehealth: Payer: Self-pay | Admitting: Family Medicine

## 2016-03-30 DIAGNOSIS — R296 Repeated falls: Secondary | ICD-10-CM | POA: Diagnosis not present

## 2016-03-30 DIAGNOSIS — S52021D Displaced fracture of olecranon process without intraarticular extension of right ulna, subsequent encounter for closed fracture with routine healing: Secondary | ICD-10-CM | POA: Diagnosis not present

## 2016-03-30 DIAGNOSIS — M25561 Pain in right knee: Secondary | ICD-10-CM | POA: Diagnosis not present

## 2016-03-30 DIAGNOSIS — N184 Chronic kidney disease, stage 4 (severe): Secondary | ICD-10-CM | POA: Diagnosis not present

## 2016-03-30 DIAGNOSIS — I129 Hypertensive chronic kidney disease with stage 1 through stage 4 chronic kidney disease, or unspecified chronic kidney disease: Secondary | ICD-10-CM | POA: Diagnosis not present

## 2016-03-30 DIAGNOSIS — M25562 Pain in left knee: Secondary | ICD-10-CM | POA: Diagnosis not present

## 2016-03-30 NOTE — Telephone Encounter (Signed)
OT with [facility]: pts bp is high.  It is 172/96.  Pt says she did take her BP meds this morning.  She will take her evening pills after she eats dinner with her daughter. OT would like to have some parameters about her blood pressure like what is considered too high. Please call him

## 2016-03-31 ENCOUNTER — Telehealth: Payer: Self-pay | Admitting: *Deleted

## 2016-03-31 ENCOUNTER — Other Ambulatory Visit: Payer: Self-pay | Admitting: *Deleted

## 2016-03-31 DIAGNOSIS — M25561 Pain in right knee: Secondary | ICD-10-CM | POA: Diagnosis not present

## 2016-03-31 DIAGNOSIS — I129 Hypertensive chronic kidney disease with stage 1 through stage 4 chronic kidney disease, or unspecified chronic kidney disease: Secondary | ICD-10-CM | POA: Diagnosis not present

## 2016-03-31 DIAGNOSIS — M25562 Pain in left knee: Secondary | ICD-10-CM | POA: Diagnosis not present

## 2016-03-31 DIAGNOSIS — R296 Repeated falls: Secondary | ICD-10-CM | POA: Diagnosis not present

## 2016-03-31 DIAGNOSIS — S52021D Displaced fracture of olecranon process without intraarticular extension of right ulna, subsequent encounter for closed fracture with routine healing: Secondary | ICD-10-CM | POA: Diagnosis not present

## 2016-03-31 DIAGNOSIS — N184 Chronic kidney disease, stage 4 (severe): Secondary | ICD-10-CM | POA: Diagnosis not present

## 2016-03-31 MED ORDER — OMEPRAZOLE 20 MG PO CPDR: 20.0000 mg | DELAYED_RELEASE_CAPSULE | Freq: Every day | ORAL | 3 refills | Status: DC

## 2016-03-31 NOTE — Telephone Encounter (Signed)
Pts daughter called and wanted to let Dr. Nori Riis to know that her "moms cancer is back and in her lymph nodes" she wanted Dr. Nori Riis to do this so she could read the report.  Samyiah Halvorsen, Salome Spotted, CMA

## 2016-04-01 ENCOUNTER — Encounter: Payer: Self-pay | Admitting: Gynecologic Oncology

## 2016-04-01 DIAGNOSIS — R296 Repeated falls: Secondary | ICD-10-CM | POA: Diagnosis not present

## 2016-04-01 DIAGNOSIS — I129 Hypertensive chronic kidney disease with stage 1 through stage 4 chronic kidney disease, or unspecified chronic kidney disease: Secondary | ICD-10-CM | POA: Diagnosis not present

## 2016-04-01 DIAGNOSIS — N184 Chronic kidney disease, stage 4 (severe): Secondary | ICD-10-CM | POA: Diagnosis not present

## 2016-04-01 DIAGNOSIS — M25562 Pain in left knee: Secondary | ICD-10-CM | POA: Diagnosis not present

## 2016-04-01 DIAGNOSIS — S52021D Displaced fracture of olecranon process without intraarticular extension of right ulna, subsequent encounter for closed fracture with routine healing: Secondary | ICD-10-CM | POA: Diagnosis not present

## 2016-04-01 DIAGNOSIS — M25561 Pain in right knee: Secondary | ICD-10-CM | POA: Diagnosis not present

## 2016-04-01 NOTE — Progress Notes (Signed)
Follow-up Note: Gyn-Onc  Consult was initially requested by Dr. Nori Riis for the evaluation of Meredith Bender 82 y.o. female  CC:  Chief Complaint  Patient presents with  . Vulvar Cancer    follow up visit    Assessment/Plan:  Meredith Bender  is a 82 y.o.  year old with clinically recurrent vulvar cancer (anterior) with palpable bilateral inguinal lymphadenopathy and posterior distal vaginal lesions.  History of palliative radiation to right groin and vulva.   Discussed my suspicion of recurrence with the patient and her daughter. They continue to desire no aggressive therapy and only palliative intervention. Her performance status is excellent at present.  I discussed that I anticipated a 6-12 month life expectancy. I recommend implementation of hospice when she begins to develop symptoms concerning for recurrence. I will see her back in approximately 3 months. Her daughter will return with [place] prior to this time if she feels her mother is deteriorating so that we can implement hospice care at that time.  HPI: Meredith Bender is a very pleasant 82 year old G5P5 who is seen in consultation at the request of Dr [name] for a urethral/vaginal mass and left ovarian cysts. The patient reports a 1 year (or more) history of bloody vaginal discharge. It has been stable in nature. Approximately 2 years ago, she was admitted to the hospital with a fractured hip and developed c.diff. Imaging was obtained at that time which revealed a urethral lesion (no exam was done). The patient and her family elected to not further workup the findings.  She presented to her doctor, Dr Nori Riis, in July, 2016 for symptoms of persistent vaginal bleeding (light). Dr Nori Riis noted a vaginal mass on examination. A pelvic MRI was ordered and performed on 02/28/15. It revealed a 2.3x1.9x3.9cm   Mass involving the lower half of the anterior vaginal wall, extending to the level of the introitus. The right ovary was not seen. The left  ovary contained a complex cystic lesion measuring 4.1x3.2x3.4cm with multiple internal septations. No ascites was seen.   A CA 125 was drawn on 02/25/15 and was elevated at 108 (it had been drawn on 07/20/13, with a different lab assay that typically runs lower than the current [facility] assay, and was 77.8).  The patient lives alone, she is in attendance with her daughter. She has essential HTN and CKD (20% renal function). Her past surgeries include an ex lap for repair of rectal prolapse (severe) and abdominal hysterectomy (without oophorectomy) 50 years ago. She reports this was to "stop her from having more children" and she did not have a cancer diagnosis. She has also had an appendectomy and bilateral inguinal hernia repairs.   A pre-treatment PET scan in September 2017 revealed hypermetabolic lymph nodes in the right groin and a metobolic region in the vulva/distal vagina.  She underwent primary radiation to the right groin and vulva between September 15th and November 11th, 2016 (60 Gy in 30 fractions). She tolerated treatment well.  Interval Hx: She has had no symptoms concerning for recurrence however she has had a small stroke and mutliple other medical issues in the past 3 months.  Current Meds:  Outpatient Encounter Prescriptions as of 03/28/2016  Medication Sig  . Cholecalciferol (VITAMIN D3) 1000 units CAPS Take by mouth.  . Cholecalciferol (VITAMIN D3) 3000 units TABS Take by mouth.  . hydrALAZINE (APRESOLINE) 25 MG tablet Take 1 tablet (25 mg total) by mouth 3 (three) times daily.  . traMADol Meredith Bender)  50 MG tablet Take one tablet by mouth every eight hours for pain  . triamcinolone cream (KENALOG) 0.1 % Apply to itchy areas 2 or 3 times a day as needed  . verapamil (VERELAN PM) 240 MG 24 hr capsule TAKE 1 CAPSULE BY MOUTH DAILY  . vitamin E 400 UNIT capsule Take by mouth.  . [DISCONTINUED] omeprazole (PRILOSEC) 20 MG capsule Take 20 mg by mouth daily.  Marland Kitchen acetaminophen (TYLENOL) 325  MG tablet Take 650 mg by mouth every 6 (six) hours as needed for mild pain.  Marland Kitchen loratadine (CLARITIN) 10 MG tablet Take 1 tablet (10 mg total) by mouth daily. (Patient not taking: Reported on 03/28/2016)  . saccharomyces boulardii (FLORASTOR) 250 MG capsule Take 250 mg by mouth daily.   No facility-administered encounter medications on file as of 03/28/2016.     Allergy:  Allergies  Allergen Reactions  . Codeine Nausea Only  . Penicillins Rash    Pt does not remember how long ago or severity of reaction.     Social Hx:   Social History   Social History  . Marital status: Married    Spouse name: N/A  . Number of children: 4  . Years of education: N/A   Occupational History  . Not on file.   Social History Main Topics  . Smoking status: Never Smoker  . Smokeless tobacco: Former Systems developer    Types: Snuff  . Alcohol use No  . Drug use: No  . Sexual activity: Not on file   Other Topics Concern  . Not on file   Social History Narrative  . No narrative on file    Past Surgical Hx:  Past Surgical History:  Procedure Laterality Date  . ABDOMINAL HYSTERECTOMY    . APPENDECTOMY    . HERNIA REPAIR    . INTRAMEDULLARY (IM) NAIL INTERTROCHANTERIC Right 08/29/2013   Procedure: INTRAMEDULLARY (IM) NAIL INTERTROCHANTRIC;  Surgeon: Nita Sells, MD;  Location: [place];  Service: Orthopedics;  Laterality: Right;  . TUBAL LIGATION      Past Medical Hx:  Past Medical History:  Diagnosis Date  . Anginal pain ([place])   . Arthritis   . Blood transfusion without reported diagnosis   . Cataract   . Cervical cancer ([place])   . Chronic kidney disease   . COPD (chronic obstructive pulmonary disease) ([place])   . CVA (cerebral infarction)    Remote bilateral cerebellar infarcts on CT 8/14  . Dyslipidemia 02/28/2013  . GERD (gastroesophageal reflux disease)   . Hypertension   . Intertrochanteric fracture of right femur ([place])   . Myocardial infarction ([place])   . Osteoporosis   .  Radiation 04/02/15-05/29/15   vulvar region, inguinal lymph node 60 Gu  . Stroke (cerebrum) ([place]) 01/2016  . Stroke [facility]) 1978   no deficits  . Vulvar cancer [facility])     Past Gynecological History:  SVD x 5. Hysterctomy for benign indications 50 years ago.  No LMP recorded. Patient has had a hysterectomy.  Family Hx:  Family History  Problem Relation Age of Onset  . Diabetes Mother   . Uterine cancer Sister     Review of Systems:  Constitutional  Feels well,    ENT Normal appearing ears and nares bilaterally Skin/Breast  No rash, sores, jaundice, itching, dryness Cardiovascular  No chest pain, shortness of breath, or edema  Pulmonary  No cough or wheeze.  Gastro Intestinal  No nausea, vomitting, or diarrhoea. No bright red blood per rectum, no  abdominal pain, change in bowel movement, or constipation.  Genito Urinary  No frequency, urgency, dysuria, see HPI Musculo Skeletal  No myalgia, arthralgia, joint swelling or pain  Neurologic  No weakness, numbness, change in gait,  Psychology  No depression, anxiety, insomnia.   Vitals:  Blood pressure (!) 188/83, pulse 78, temperature 97.6 F (36.4 C), temperature source Oral, resp. rate 18, height 5\' 2"  (1.575 m), weight 103 lb 11.2 oz (47 kg), SpO2 98 %.  Physical Exam: WD in NAD Neck  Supple NROM, without any enlargements.  Lymph Node Survey + palpably enlarged bilateral soft mobile lymph node - 2cm Cardiovascular  Pulse normal rate, regularity and rhythm. S1 and S2 normal.  Lungs  Clear to auscultation bilateraly, without wheezes/crackles/rhonchi. Good air movement.  Skin  No rash/lesions/breakdown  Psychiatry  Alert and oriented to person, place, and time  Abdomen  Normoactive bowel sounds, abdomen soft, non-tender and thin without evidence of hernia.  Back No CVA tenderness Genito Urinary  Vulva/vagina: No lesion on urethral meatus visible, 1cm polypoid lesion on the posterior vaginal introitus. Non  bleeding.  Bladder/urethra:  No lesions or masses, well supported bladder  Vagina: see above  Cervix: surgically absent  Uterus: absent  Adnexa: no palpabe masses. Rectal  deferred Extremities  No bilateral cyanosis, clubbing or edema.  Donaciano Eva, MD   04/01/2016, 5:11 PM

## 2016-04-04 DIAGNOSIS — R296 Repeated falls: Secondary | ICD-10-CM | POA: Diagnosis not present

## 2016-04-04 DIAGNOSIS — N184 Chronic kidney disease, stage 4 (severe): Secondary | ICD-10-CM | POA: Diagnosis not present

## 2016-04-04 DIAGNOSIS — M25562 Pain in left knee: Secondary | ICD-10-CM | POA: Diagnosis not present

## 2016-04-04 DIAGNOSIS — I129 Hypertensive chronic kidney disease with stage 1 through stage 4 chronic kidney disease, or unspecified chronic kidney disease: Secondary | ICD-10-CM | POA: Diagnosis not present

## 2016-04-04 DIAGNOSIS — S52021D Displaced fracture of olecranon process without intraarticular extension of right ulna, subsequent encounter for closed fracture with routine healing: Secondary | ICD-10-CM | POA: Diagnosis not present

## 2016-04-04 DIAGNOSIS — M25561 Pain in right knee: Secondary | ICD-10-CM | POA: Diagnosis not present

## 2016-04-04 NOTE — Telephone Encounter (Signed)
Left voice message for OT to return nurse call.  Derl Barrow, RN

## 2016-04-04 NOTE — Telephone Encounter (Signed)
Meredith Bender  Re her BP---as long as she is having no SYMPTOMS--headache, blurred vision, chest pain etc---I am OK with it occasionally going into 170 range---up to 180. If it is 180, then have her recheck it in a few hours. Make sure  she has taken her BP  Meds that day.  If she is ALWAYS in the 170 range, let me know and I can increase her hydralazine.  If she has symptoms, then they need to : 1. Make sure she has taken her BP meds that day 2. Have her rest quietly for 30 minutes or so. If symptoms are not improving in an 30 minutes to an hour or so, then they should either call us ---OR--if symptoms are severe of course go to ED,  3. I am less worried about the absolute number---I generally wasn't it to stay less than 170 but am not tooo worried it it is occasionally above---as long as she is asymptomatic.  Please let them know  if you want you can fax a copy of this note to them [facility]! THANKS! THANKS! [name]

## 2016-04-05 NOTE — Telephone Encounter (Signed)
Spoke with Radovan, OT regarding patient's blood pressure.  Advised him of Dr. Arloa Koh orders.  See message from Dr. Nori Riis on 04/04/16.  Patient call clinic if BP is over 180 and symptotic, if symptoms are severe to go to ED.

## 2016-04-08 DIAGNOSIS — S52021D Displaced fracture of olecranon process without intraarticular extension of right ulna, subsequent encounter for closed fracture with routine healing: Secondary | ICD-10-CM | POA: Diagnosis not present

## 2016-04-08 DIAGNOSIS — M25562 Pain in left knee: Secondary | ICD-10-CM | POA: Diagnosis not present

## 2016-04-08 DIAGNOSIS — R296 Repeated falls: Secondary | ICD-10-CM | POA: Diagnosis not present

## 2016-04-08 DIAGNOSIS — M25561 Pain in right knee: Secondary | ICD-10-CM | POA: Diagnosis not present

## 2016-04-08 DIAGNOSIS — N184 Chronic kidney disease, stage 4 (severe): Secondary | ICD-10-CM | POA: Diagnosis not present

## 2016-04-08 DIAGNOSIS — I129 Hypertensive chronic kidney disease with stage 1 through stage 4 chronic kidney disease, or unspecified chronic kidney disease: Secondary | ICD-10-CM | POA: Diagnosis not present

## 2016-04-11 DIAGNOSIS — S52021D Displaced fracture of olecranon process without intraarticular extension of right ulna, subsequent encounter for closed fracture with routine healing: Secondary | ICD-10-CM | POA: Diagnosis not present

## 2016-04-11 DIAGNOSIS — M25561 Pain in right knee: Secondary | ICD-10-CM | POA: Diagnosis not present

## 2016-04-11 DIAGNOSIS — N184 Chronic kidney disease, stage 4 (severe): Secondary | ICD-10-CM | POA: Diagnosis not present

## 2016-04-11 DIAGNOSIS — I129 Hypertensive chronic kidney disease with stage 1 through stage 4 chronic kidney disease, or unspecified chronic kidney disease: Secondary | ICD-10-CM | POA: Diagnosis not present

## 2016-04-11 DIAGNOSIS — M25562 Pain in left knee: Secondary | ICD-10-CM | POA: Diagnosis not present

## 2016-04-11 DIAGNOSIS — R296 Repeated falls: Secondary | ICD-10-CM | POA: Diagnosis not present

## 2016-04-13 ENCOUNTER — Telehealth: Payer: Self-pay | Admitting: Gynecologic Oncology

## 2016-04-13 NOTE — Telephone Encounter (Signed)
Received a call from Hospice stating that one of the patient's family members called and is requesting a palliative care consult.  Referral placed for palliative care with [facility] at Hospice/Palliative.

## 2016-04-14 ENCOUNTER — Encounter: Payer: Self-pay | Admitting: Gynecologic Oncology

## 2016-04-14 DIAGNOSIS — M25561 Pain in right knee: Secondary | ICD-10-CM | POA: Diagnosis not present

## 2016-04-14 DIAGNOSIS — S52021D Displaced fracture of olecranon process without intraarticular extension of right ulna, subsequent encounter for closed fracture with routine healing: Secondary | ICD-10-CM | POA: Diagnosis not present

## 2016-04-14 DIAGNOSIS — R296 Repeated falls: Secondary | ICD-10-CM | POA: Diagnosis not present

## 2016-04-14 DIAGNOSIS — N184 Chronic kidney disease, stage 4 (severe): Secondary | ICD-10-CM | POA: Diagnosis not present

## 2016-04-14 DIAGNOSIS — I129 Hypertensive chronic kidney disease with stage 1 through stage 4 chronic kidney disease, or unspecified chronic kidney disease: Secondary | ICD-10-CM | POA: Diagnosis not present

## 2016-04-14 DIAGNOSIS — M25562 Pain in left knee: Secondary | ICD-10-CM | POA: Diagnosis not present

## 2016-04-14 NOTE — Progress Notes (Signed)
Referral was placed yesterday with [facility].  Received a phone call from Dr. Clifton James with Palliative care this am stating the patient lives in [place] which is a 50 min drive each way and they would not be able to provide services in the home.  Called Hospice[facility]Palliative of [facility] and they stated they service [place].  Referral placed with [facility].  Records faxed to [phone].  Daughter listed as the main contact.

## 2016-04-15 DIAGNOSIS — N184 Chronic kidney disease, stage 4 (severe): Secondary | ICD-10-CM | POA: Diagnosis not present

## 2016-04-15 DIAGNOSIS — I129 Hypertensive chronic kidney disease with stage 1 through stage 4 chronic kidney disease, or unspecified chronic kidney disease: Secondary | ICD-10-CM | POA: Diagnosis not present

## 2016-04-15 DIAGNOSIS — M25561 Pain in right knee: Secondary | ICD-10-CM | POA: Diagnosis not present

## 2016-04-15 DIAGNOSIS — M25562 Pain in left knee: Secondary | ICD-10-CM | POA: Diagnosis not present

## 2016-04-15 DIAGNOSIS — S52021D Displaced fracture of olecranon process without intraarticular extension of right ulna, subsequent encounter for closed fracture with routine healing: Secondary | ICD-10-CM | POA: Diagnosis not present

## 2016-04-15 DIAGNOSIS — R296 Repeated falls: Secondary | ICD-10-CM | POA: Diagnosis not present

## 2016-04-20 DIAGNOSIS — N184 Chronic kidney disease, stage 4 (severe): Secondary | ICD-10-CM | POA: Diagnosis not present

## 2016-04-20 DIAGNOSIS — M25561 Pain in right knee: Secondary | ICD-10-CM | POA: Diagnosis not present

## 2016-04-20 DIAGNOSIS — S52021D Displaced fracture of olecranon process without intraarticular extension of right ulna, subsequent encounter for closed fracture with routine healing: Secondary | ICD-10-CM | POA: Diagnosis not present

## 2016-04-20 DIAGNOSIS — I129 Hypertensive chronic kidney disease with stage 1 through stage 4 chronic kidney disease, or unspecified chronic kidney disease: Secondary | ICD-10-CM | POA: Diagnosis not present

## 2016-04-20 DIAGNOSIS — M25562 Pain in left knee: Secondary | ICD-10-CM | POA: Diagnosis not present

## 2016-04-20 DIAGNOSIS — R296 Repeated falls: Secondary | ICD-10-CM | POA: Diagnosis not present

## 2016-04-25 ENCOUNTER — Telehealth: Payer: Self-pay | Admitting: Gynecologic Oncology

## 2016-04-25 NOTE — Telephone Encounter (Signed)
Daughter has noticed some increased "tummy pain". I discussed that this might be reflective of progression of disease. Of note she had an ovarian cyst diagnosed at the time of initial diagnosis and an elevated CA 125.   They are still interested in palliative care only.  I offered having her come in for evaluation and assistance setting up hospice. She declined for now.  I recommended that they notify us if they appreciate that her condition is deteriorating more and we can facilitate setting up hospice as an outpatient.  Donaciano Eva, MD

## 2016-04-26 ENCOUNTER — Telehealth: Payer: Self-pay | Admitting: Family Medicine

## 2016-04-26 NOTE — Telephone Encounter (Signed)
Daughter is calling because her mother's caretaker said that she has upset stomach and is throwing up green stuff. jw

## 2016-04-26 NOTE — Telephone Encounter (Signed)
Routing to PCP, Kennyth Lose stated that daughter just wants something called in for her mom for this. Katharina Caper, Yared Susan D, [place]

## 2016-04-27 MED ORDER — PROMETHAZINE HCL 12.5 MG RE SUPP: 12.5000 mg | Freq: Three times a day (TID) | RECTAL | 1 refills | Status: DC | PRN

## 2016-04-27 MED ORDER — ONDANSETRON HCL 4 MG PO TABS: 4.0000 mg | ORAL_TABLET | Freq: Three times a day (TID) | ORAL | 0 refills | Status: DC | PRN

## 2016-04-27 NOTE — Telephone Encounter (Signed)
Spoke to daughter. Informed her that Zofran has been called in. She asked if her mother has had this and if a suppository would be better. After speaking with Dr. Nori Riis and back with the daughter a suppository will be called in by Dr. Nori Riis. Ottis Stain, [place]

## 2016-04-27 NOTE — Telephone Encounter (Signed)
Dear [name] Team Please let her duaghter know I have called in some zofran for nausea, but of she is not getting better soon we need to see her THANKS! [name]

## 2016-05-04 ENCOUNTER — Encounter (HOSPITAL_COMMUNITY): Payer: Self-pay | Admitting: *Deleted

## 2016-05-04 ENCOUNTER — Emergency Department (HOSPITAL_COMMUNITY): Payer: Medicare Other

## 2016-05-04 ENCOUNTER — Inpatient Hospital Stay (HOSPITAL_COMMUNITY)
Admission: EM | Admit: 2016-05-04 | Discharge: 2016-05-07 | DRG: 291 | Disposition: A | Discharge status: Hospice | Payer: Medicare Other | Attending: Family Medicine | Admitting: Family Medicine

## 2016-05-04 DIAGNOSIS — Z8544 Personal history of malignant neoplasm of other female genital organs: Secondary | ICD-10-CM | POA: Diagnosis not present

## 2016-05-04 DIAGNOSIS — C799 Secondary malignant neoplasm of unspecified site: Secondary | ICD-10-CM | POA: Diagnosis not present

## 2016-05-04 DIAGNOSIS — C779 Secondary and unspecified malignant neoplasm of lymph node, unspecified: Secondary | ICD-10-CM | POA: Diagnosis present

## 2016-05-04 DIAGNOSIS — Z681 Body mass index (BMI) 19 or less, adult: Secondary | ICD-10-CM | POA: Diagnosis not present

## 2016-05-04 DIAGNOSIS — J440 Chronic obstructive pulmonary disease with acute lower respiratory infection: Secondary | ICD-10-CM | POA: Diagnosis present

## 2016-05-04 DIAGNOSIS — K219 Gastro-esophageal reflux disease without esophagitis: Secondary | ICD-10-CM | POA: Diagnosis present

## 2016-05-04 DIAGNOSIS — E785 Hyperlipidemia, unspecified: Secondary | ICD-10-CM | POA: Diagnosis present

## 2016-05-04 DIAGNOSIS — R0602 Shortness of breath: Secondary | ICD-10-CM

## 2016-05-04 DIAGNOSIS — Z9221 Personal history of antineoplastic chemotherapy: Secondary | ICD-10-CM

## 2016-05-04 DIAGNOSIS — Z515 Encounter for palliative care: Secondary | ICD-10-CM | POA: Diagnosis present

## 2016-05-04 DIAGNOSIS — N184 Chronic kidney disease, stage 4 (severe): Secondary | ICD-10-CM | POA: Diagnosis present

## 2016-05-04 DIAGNOSIS — Z87891 Personal history of nicotine dependence: Secondary | ICD-10-CM | POA: Diagnosis not present

## 2016-05-04 DIAGNOSIS — Z923 Personal history of irradiation: Secondary | ICD-10-CM | POA: Diagnosis not present

## 2016-05-04 DIAGNOSIS — Z66 Do not resuscitate: Secondary | ICD-10-CM | POA: Diagnosis present

## 2016-05-04 DIAGNOSIS — Z79899 Other long term (current) drug therapy: Secondary | ICD-10-CM | POA: Diagnosis not present

## 2016-05-04 DIAGNOSIS — Z8049 Family history of malignant neoplasm of other genital organs: Secondary | ICD-10-CM

## 2016-05-04 DIAGNOSIS — R918 Other nonspecific abnormal finding of lung field: Secondary | ICD-10-CM | POA: Diagnosis not present

## 2016-05-04 DIAGNOSIS — Z90710 Acquired absence of both cervix and uterus: Secondary | ICD-10-CM

## 2016-05-04 DIAGNOSIS — Z833 Family history of diabetes mellitus: Secondary | ICD-10-CM | POA: Diagnosis not present

## 2016-05-04 DIAGNOSIS — I130 Hypertensive heart and chronic kidney disease with heart failure and stage 1 through stage 4 chronic kidney disease, or unspecified chronic kidney disease: Principal | ICD-10-CM | POA: Diagnosis present

## 2016-05-04 DIAGNOSIS — I5033 Acute on chronic diastolic (congestive) heart failure: Secondary | ICD-10-CM | POA: Diagnosis present

## 2016-05-04 DIAGNOSIS — E440 Moderate protein-calorie malnutrition: Secondary | ICD-10-CM | POA: Diagnosis present

## 2016-05-04 DIAGNOSIS — I509 Heart failure, unspecified: Secondary | ICD-10-CM | POA: Diagnosis not present

## 2016-05-04 DIAGNOSIS — J90 Pleural effusion, not elsewhere classified: Secondary | ICD-10-CM | POA: Diagnosis not present

## 2016-05-04 DIAGNOSIS — Z8673 Personal history of transient ischemic attack (TIA), and cerebral infarction without residual deficits: Secondary | ICD-10-CM | POA: Diagnosis not present

## 2016-05-04 DIAGNOSIS — I110 Hypertensive heart disease with heart failure: Secondary | ICD-10-CM | POA: Diagnosis not present

## 2016-05-04 LAB — CBC
HCT: 35.5 % — ABNORMAL LOW (ref 36.0–46.0)
Hemoglobin: 11.5 g/dL — ABNORMAL LOW (ref 12.0–15.0)
MCH: 28.5 pg (ref 26.0–34.0)
MCHC: 32.4 g/dL (ref 30.0–36.0)
MCV: 88.1 fL (ref 78.0–100.0)
PLATELETS: 252 10*3/uL (ref 150–400)
RBC: 4.03 MIL/uL (ref 3.87–5.11)
RDW: 13.4 % (ref 11.5–15.5)
WBC: 8.5 10*3/uL (ref 4.0–10.5)

## 2016-05-04 LAB — I-STAT TROPONIN, ED
TROPONIN I, POC: 0.03 ng/mL (ref 0.00–0.08)
TROPONIN I, POC: 0.03 ng/mL (ref 0.00–0.08)

## 2016-05-04 LAB — BASIC METABOLIC PANEL
ANION GAP: 9 (ref 5–15)
BUN: 35 mg/dL — ABNORMAL HIGH (ref 6–20)
CALCIUM: 8.9 mg/dL (ref 8.9–10.3)
CO2: 21 mmol/L — ABNORMAL LOW (ref 22–32)
CREATININE: 2.49 mg/dL — AB (ref 0.44–1.00)
Chloride: 109 mmol/L (ref 101–111)
GFR, EST AFRICAN AMERICAN: 19 mL/min — AB (ref >60)
GFR, EST NON AFRICAN AMERICAN: 16 mL/min — AB (ref >60)
Glucose, Bld: 95 mg/dL (ref 65–99)
Potassium: 4.6 mmol/L (ref 3.5–5.1)
SODIUM: 139 mmol/L (ref 135–145)

## 2016-05-04 LAB — MAGNESIUM: Magnesium: 2.1 mg/dL (ref 1.7–2.4)

## 2016-05-04 LAB — BRAIN NATRIURETIC PEPTIDE: B Natriuretic Peptide: 1526.1 pg/mL — ABNORMAL HIGH (ref 0.0–100.0)

## 2016-05-04 MED ORDER — ONDANSETRON HCL 4 MG PO TABS: 4.0000 mg | ORAL_TABLET | Freq: Three times a day (TID) | ORAL | Status: DC | PRN

## 2016-05-04 MED ORDER — PANTOPRAZOLE SODIUM 40 MG PO TBEC
40.0000 mg | DELAYED_RELEASE_TABLET | Freq: Every day | ORAL | Status: DC
  Administered 2016-05-05 – 2016-05-07 (×3): 40 mg via ORAL
  Filled 2016-05-04 (×3): qty 1

## 2016-05-04 MED ORDER — ONDANSETRON HCL 4 MG/2ML IJ SOLN
4.0000 mg | Freq: Once | INTRAMUSCULAR | Status: AC
  Administered 2016-05-04: 4 mg via INTRAVENOUS
  Filled 2016-05-04: qty 2

## 2016-05-04 MED ORDER — FUROSEMIDE 10 MG/ML IJ SOLN
20.0000 mg | Freq: Once | INTRAMUSCULAR | Status: AC
  Administered 2016-05-04: 20 mg via INTRAVENOUS
  Filled 2016-05-04: qty 2

## 2016-05-04 MED ORDER — ENOXAPARIN SODIUM 30 MG/0.3ML ~~LOC~~ SOLN
30.0000 mg | Freq: Every day | SUBCUTANEOUS | Status: DC
  Administered 2016-05-05 – 2016-05-07 (×3): 30 mg via SUBCUTANEOUS
  Filled 2016-05-04 (×3): qty 0.3

## 2016-05-04 MED ORDER — TRAMADOL HCL 50 MG PO TABS
50.0000 mg | ORAL_TABLET | Freq: Three times a day (TID) | ORAL | Status: DC | PRN
  Administered 2016-05-05 – 2016-05-06 (×2): 50 mg via ORAL
  Filled 2016-05-04 (×2): qty 1

## 2016-05-04 MED ORDER — NITROGLYCERIN 2 % TD OINT
1.0000 [in_us] | TOPICAL_OINTMENT | Freq: Once | TRANSDERMAL | Status: AC
  Administered 2016-05-04: 1 [in_us] via TOPICAL
  Filled 2016-05-04: qty 1

## 2016-05-04 MED ORDER — VERAPAMIL HCL ER 240 MG PO TBCR
240.0000 mg | EXTENDED_RELEASE_TABLET | Freq: Every day | ORAL | Status: DC
  Administered 2016-05-05 – 2016-05-07 (×3): 240 mg via ORAL
  Filled 2016-05-04 (×4): qty 1

## 2016-05-04 MED ORDER — ACETAMINOPHEN 325 MG PO TABS
650.0000 mg | ORAL_TABLET | Freq: Four times a day (QID) | ORAL | Status: DC | PRN
  Administered 2016-05-05: 650 mg via ORAL
  Filled 2016-05-04: qty 2

## 2016-05-04 MED ORDER — DOCUSATE SODIUM 100 MG PO CAPS
100.0000 mg | ORAL_CAPSULE | Freq: Two times a day (BID) | ORAL | Status: DC
  Administered 2016-05-05 – 2016-05-07 (×6): 100 mg via ORAL
  Filled 2016-05-04 (×6): qty 1

## 2016-05-04 MED ORDER — ACETAMINOPHEN 650 MG RE SUPP: 650.0000 mg | Freq: Four times a day (QID) | RECTAL | Status: DC | PRN

## 2016-05-04 MED ORDER — HYDRALAZINE HCL 25 MG PO TABS
25.0000 mg | ORAL_TABLET | Freq: Three times a day (TID) | ORAL | Status: DC
  Administered 2016-05-05 – 2016-05-07 (×8): 25 mg via ORAL
  Filled 2016-05-04 (×8): qty 1

## 2016-05-04 MED ORDER — DEXTROSE 5 % IV SOLN
500.0000 mg | Freq: Once | INTRAVENOUS | Status: AC
  Administered 2016-05-04: 500 mg via INTRAVENOUS
  Filled 2016-05-04: qty 500

## 2016-05-04 MED ORDER — DEXTROSE 5 % IV SOLN
1.0000 g | Freq: Once | INTRAVENOUS | Status: AC
  Administered 2016-05-04: 1 g via INTRAVENOUS
  Filled 2016-05-04: qty 10

## 2016-05-04 NOTE — ED Notes (Signed)
Pt is wanting to go home. Daughter is wanting to take her home to wait. Tech first discouraged her from doing so. Santiago Glad RN notified about pts blood pressure.

## 2016-05-04 NOTE — H&P (Signed)
[facility] Admission History and Physical Service Pager: [phone]  Patient name: Meredith Bender Medical record number: [id number] Date of birth: Apr 25, 1926 Age: 82 y.o. Gender: female  Primary Care Provider: [name], MD Consultants: None Code Status: DNR  Chief Complaint: shortness of breath  Assessment and Plan: Meredith Bender is a 82 y.o. female presenting with shortness of breath . PMH is significant for CHF, CKD stage IV, Right hip fx, vulvovaginal cancer, HTN.   #Shortness of breath. Symptoms began today.  Cough present, denies chest pain.  S/p IV Lasix 20 mg and Zofran in ED.  On 2 L O2 and satting 92%; new oxygen requirement.  No O2 requirement at home.  Tachypneic.  I-stat troponin in ED <0.03.  Concern for PE given history of vulvovaginal cancer and recent diagnosis of spread to lymph nodes. Wells score 4.0. Last echo 02/2013 showing G1DD with EF 50-55%.   BNP 1526. Mag 2.1 wnl.   Afebrile with no white count.  CXR with increased opacity at right lung base consistent with infiltrate and bilateral pleural effusions. Dyspnea most likely 2/2 CHF exacerbation. Need to r/o acute PE. Given R>L infiltrates on CXR, empirically treated for CAP with rocephin and azithromycin in ED. H/o COPD in chart but patient denies diagnosis (no hisotry of smoking).  -Admit to med-surg , attending Dr. Nori Riis  -Cardiac monitoring -continuous pulse ox -O2 per nasal cannula; currently on 3L -repeat Echo -consider cardiology c/s in AM -D-dimer ordered.  If elevated, VQ scan.  Avoid CTA as she has stage IV CKD.  -Zofran 4 mg prn for nausea -Continue IV Lasix 20 mg daily and adjust as needed based on UOP -Daily weights -I's and O's -Tylenol 650 mg prn -AM BMP -PT/OT eval and treat -vitals per unit routine  #Cough 2/2 CAP.  CXR showing increased opacity at right lung base consistent with infiltrate and bilateral pleural effusions. S/P Azithromycin and CTX in ED.  Afebrile with  no white count.  -continue treatment for community acquired pneumonia -monitor for fevers  #CKD stage IV. Creatinine on admission 2.49.   Baseline 2.0-2.5.  -Daily BMET -Avoid nephrotoxic agents  #HTN.  BP on admission 184/122.  Patient with hx of SBP usually around 170 and without symptoms.  -Continue home Verapamil 240 mg daily -Continue home Hydralazine 25 mg Q8  #Hx vulvovaginal cancer.  S/p chemoradiation.  With recent diagnosis of cancer in her lymph nodes. -continue home Tramadol 50 mg Q8 as needed for pain -family seeking palliative care -Palliative care c/s in AM  FEN/GI: Regular diet , SLIV Prophylaxis: Lovenox, Protonix  Disposition: Admit to med-surg, attending Dr. Nori Riis  History of Present Illness:  Meredith Bender is a 82 y.o. female presenting with new-onset shortness of breath.  She woke up this morning at around 6am and felt as though she couldn't breathe.  Thought that it would get better on its own and called her daughter around 3 pm. New swelling of feet yesterday as well.  She reports a dry cough that is worse in the morning .  Per daughter she has had that cough for some time and does not cough up sputum.  No recent illnesses, no fevers, chills, diarrhea.  Has had some vomiting and nausea a week ago and had green colored emesis.  Has resolved.  No altered mental status, but has some residual deficits from a stroke in August, per daughter.  Lives alone and is fairly independent but sometimes forgets to take  her meds.  Denies dizziness, syncope, no falls or recent trauma. No pain with walking. She is not on oxygen at home.  Not a smoker.   Review Of Systems: Per HPI.  All systems reviewed and negative.   Review of Systems  Neurological: Negative for dizziness.   Patient Active Problem List   Diagnosis Date Noted  . Heart failure ([place]) 05/04/2016  . CHF (congestive heart failure) ([place]) 05/04/2016  . Abnormality of gait 03/25/2016  . CKD (chronic kidney disease)  stage 4, GFR 15-29 ml/min (HCC) 03/14/2016  . Acute on chronic kidney failure ([place])   . Intracerebral hemorrhage 02/22/2016  . Cerebellar hemorrhage ([place])   . Olecranon fracture   . Intracranial hemorrhage ([place]) 02/20/2016  . Near syncope 02/20/2016  . 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 Dysuria 05/05/2015  . Primary vulvar cancer ([place]) 03/19/2015  . Urethral lesion 03/06/2015  . Vulvar mass 03/06/2015  . Left ovarian cyst 03/06/2015  . Postmenopausal vaginal bleeding 02/25/2015  . s/p hysterectomy for prolapsed uterus 02/25/2015  . Vaginal mass 02/25/2015  . History of repair for rectocele 02/25/2015  . Living accommodation issues 12/18/2014  . Knee pain, bilateral 01/09/2014  . Rotator cuff syndrome of both shoulders 01/09/2014  . Arthritis 12/02/2013  . DNR (do not resuscitate) discussion 12/02/2013  . Intertrochanteric fracture of right hip ([place]) 08/29/2013  . History of UTI 06/12/2013  . Routine health maintenance 06/12/2013  . Renal insufficiency 02/28/2013  . Dyslipidemia 02/28/2013  . Benign hypertension with CKD (chronic kidney disease) stage IV [facility])    Past Medical History: Past Medical History:  Diagnosis Date  . Anginal pain ([place])   . Arthritis   . Blood transfusion without reported diagnosis   . Cataract   . Cervical cancer ([place])   . Chronic kidney disease   . COPD (chronic obstructive pulmonary disease) ([place])   . CVA (cerebral infarction)    Remote bilateral cerebellar infarcts on CT 8/14  . Dyslipidemia 02/28/2013  . GERD (gastroesophageal reflux disease)   . Hypertension   . Intertrochanteric fracture of right femur ([place])   . Myocardial infarction   . Osteoporosis   . Radiation 04/02/15-05/29/15   vulvar region, inguinal lymph node 60 Gu  . Stroke (cerebrum) ([place]) 01/2016  . Stroke [facility]) 1978   no deficits  . Vulvar cancer [facility])    Past Surgical History: Past Surgical History:  Procedure Laterality Date  . ABDOMINAL HYSTERECTOMY    . APPENDECTOMY     . HERNIA REPAIR    . INTRAMEDULLARY (IM) NAIL INTERTROCHANTERIC Right 08/29/2013   Procedure: INTRAMEDULLARY (IM) NAIL INTERTROCHANTRIC;  Surgeon: Nita Sells, MD;  Location: [place];  Service: Orthopedics;  Laterality: Right;  . TUBAL LIGATION     Social History: Social History  Substance Use Topics  . Smoking status: Never Smoker  . Smokeless tobacco: Former Systems developer    Types: Snuff  . Alcohol use No   In addition: Lives at home alone  Family History: Family History  Problem Relation Age of Onset  . Diabetes Mother   . Uterine cancer Sister    Allergies and Medications: Allergies  Allergen Reactions  . Codeine Nausea Only  . Penicillins Rash    Pt does not remember how long ago or severity of reaction.    No current facility-administered medications on file prior to encounter.    Current Outpatient Prescriptions on File Prior to Encounter  Medication Sig Dispense Refill  . Cholecalciferol (VITAMIN D3) 3000 units TABS Take 3,000 Units  by mouth daily.     . hydrALAZINE (APRESOLINE) 25 MG tablet Take 1 tablet (25 mg total) by mouth 3 (three) times daily.    Marland Kitchen loratadine (CLARITIN) 10 MG tablet Take 1 tablet (10 mg total) by mouth daily. 90 tablet 3  . omeprazole (PRILOSEC) 20 MG capsule Take 1 capsule (20 mg total) by mouth daily. 90 capsule 3  . ondansetron (ZOFRAN) 4 MG tablet Take 1 tablet (4 mg total) by mouth every 8 (eight) hours as needed for nausea or vomiting. 20 tablet 0  . promethazine (PHENERGAN) 12.5 MG suppository Place 1 suppository (12.5 mg total) rectally every 8 (eight) hours as needed for nausea or vomiting. 20 each 1  . saccharomyces boulardii (FLORASTOR) 250 MG capsule Take 250 mg by mouth daily.    . traMADol (ULTRAM) 50 MG tablet Take one tablet by mouth every eight hours for pain (Patient taking differently: Take 50 mg by mouth every 8 (eight) hours as needed for moderate pain. Take one tablet by mouth every eight hours for pain) 90 tablet 5  .  triamcinolone cream (KENALOG) 0.1 % Apply to itchy areas 2 or 3 times a day as needed 80 g 3  . verapamil (VERELAN PM) 240 MG 24 hr capsule TAKE 1 CAPSULE BY MOUTH DAILY 90 capsule 0  . vitamin E 400 UNIT capsule Take by mouth.     Objective: BP (!) 166/88 (BP Location: Left Arm)   Pulse 85   Temp 97.9 F (36.6 C) (Oral)   Resp 20   Ht 5\' 5"  (1.651 m)   Wt 100 lb 3.2 oz (45.5 kg) Comment: scale a  SpO2 96%   BMI 16.67 kg/m  Exam: General: elderly, tired-appearing female lying in bed in no acute distress, nasal canula in place Eyes: EOMI, PERRL, no conjunctival injection ENTM: MMM, oropharynx clear, no exudates Neck: normal, supple Cardiovascular: RRR, no MRG, peripheral pulses detectable Respiratory: CTA B/L, normal effort, bilateral rales noted, nasal canula in place Gastrointestinal: soft, NT ND, +bs MSK: no edema or tenderness, good ROM in all 4 extremities Derm: warm, well perfused, not diaphoretic Neuro: AAOx3.  No focal deficits.  Strength 5/5 in upper and lower extremities.  Psych: Normal mood and affect.   Labs and Imaging: Results for orders placed or performed during the hospital encounter of 05/04/16 (from the past 24 hour(s))  Basic metabolic panel     Status: Abnormal   Collection Time: 05/04/16  5:15 PM  Result Value Ref Range   Sodium 139 135 - 145 mmol/L   Potassium 4.6 3.5 - 5.1 mmol/L   Chloride 109 101 - 111 mmol/L   CO2 21 (L) 22 - 32 mmol/L   Glucose, Bld 95 65 - 99 mg/dL   BUN 35 (H) 6 - 20 mg/dL   Creatinine, Ser 2.49 (H) 0.44 - 1.00 mg/dL   Calcium 8.9 8.9 - 10.3 mg/dL   GFR calc non Af Amer 16 (L) >60 mL/min   GFR calc Af Amer 19 (L) >60 mL/min   Anion gap 9 5 - 15  CBC     Status: Abnormal   Collection Time: 05/04/16  5:15 PM  Result Value Ref Range   WBC 8.5 4.0 - 10.5 K/uL   RBC 4.03 3.87 - 5.11 MIL/uL   Hemoglobin 11.5 (L) 12.0 - 15.0 g/dL   HCT 35.5 (L) 36.0 - 46.0 %   MCV 88.1 78.0 - 100.0 fL   MCH 28.5 26.0 - 34.0 pg   MCHC  32.4  30.0 - 36.0 g/dL   RDW 13.4 11.5 - 15.5 %   Platelets 252 150 - 400 K/uL  Brain natriuretic peptide     Status: Abnormal   Collection Time: 05/04/16  5:15 PM  Result Value Ref Range   B Natriuretic Peptide 1,526.1 (H) 0.0 - 100.0 pg/mL  Magnesium     Status: None   Collection Time: 05/04/16  5:15 PM  Result Value Ref Range   Magnesium 2.1 1.7 - 2.4 mg/dL  I-stat troponin, ED     Status: None   Collection Time: 05/04/16  5:20 PM  Result Value Ref Range   Troponin i, poc 0.03 0.00 - 0.08 ng/mL   Comment 3          I-Stat Troponin, ED (not at [facility])     Status: None   Collection Time: 05/04/16  8:56 PM  Result Value Ref Range   Troponin i, poc 0.03 0.00 - 0.08 ng/mL   Comment 3          D-dimer, quantitative (not at [facility])     Status: Abnormal   Collection Time: 05/04/16 11:34 PM  Result Value Ref Range   D-Dimer, Quant 0.75 (H) 0.00 - 0.50 ug/mL-FEU   Dg Chest 2 View  Result Date: 05/04/2016 CLINICAL DATA:  SOB since this AM. Pt denies CP. Hx of HTN, COPD, CVA, stroke, MI. Nonsmoker. EXAM: CHEST  2 VIEW COMPARISON:  08/30/2013 FINDINGS: Heart is enlarged. Perihilar peribronchial thickening is noted and stable. There is increased opacity at the right lung base consistent with infiltrate. There are bilateral pleural effusions. IMPRESSION: 1. Cardiomegaly. 2. Right lower lobe infiltrate and bilateral pleural effusions. Electronically Signed   By: Nolon Nations M.D.   On: 05/04/2016 17:25   Lovenia Kim, MD 05/05/2016, 12:48 AM PGY-1, [place] Intern pager: [phone], text pages welcome  FPTS Upper-Level Resident Addendum  I have independently interviewed and examined the patient. I have discussed the above with the original author and agree with their documentation. My edits for correction/addition/clarification are in pink. Please see also any attending notes.   Katheren Shams, DO PGY-2, [place] Service pager: [phone] (text pages  welcome through [facility])

## 2016-05-04 NOTE — ED Notes (Signed)
Report called to [place], RN at this time.  Receiving nurse denies having any further questions at this time.

## 2016-05-04 NOTE — ED Notes (Signed)
Contacted lab to add BNP and magnesium at this time.

## 2016-05-04 NOTE — ED Notes (Signed)
Pt's oxygen saturation remains at 92% at this time while receiving [place] at 2 LPM.  Oxygen flow rate increased to 4 lpm at this time.

## 2016-05-04 NOTE — ED Notes (Signed)
Updated daughter that pt's room is being cleaned.

## 2016-05-04 NOTE — ED Triage Notes (Signed)
Pt also has recent diagnosis if cancer in her lymph nodes. Family is looking into palliative care.

## 2016-05-04 NOTE — ED Provider Notes (Signed)
[facility] DEPT Provider Note   CSN: [id number] Arrival date & time: 05/04/16  [id number]     History   Chief Complaint Chief Complaint  Patient presents with  . Shortness of Breath    HPI Meredith Bender is a 82 y.o. female.  The history is provided by the patient and a relative (daughter).  Shortness of Breath  This is a new problem. The average episode lasts 1 day. The problem occurs continuously.The current episode started 12 to 24 hours ago (woke up this morning dyspneic, normal last night). The problem has been gradually worsening. Associated symptoms include cough. Pertinent negatives include no fever, no headaches, no sputum production, no wheezing, no chest pain, no vomiting, no abdominal pain, no rash, no leg pain, no leg swelling and no claudication. It is unknown what precipitated the problem. She has tried nothing for the symptoms. Associated medical issues do not include chronic lung disease, PE or DVT.    Past Medical History:  Diagnosis Date  . Anginal pain ([place])   . Arthritis   . Blood transfusion without reported diagnosis   . Cataract   . Cervical cancer ([place])   . Chronic kidney disease   . COPD (chronic obstructive pulmonary disease) ([place])   . CVA (cerebral infarction)    Remote bilateral cerebellar infarcts on CT 8/14  . Dyslipidemia 02/28/2013  . GERD (gastroesophageal reflux disease)   . Hypertension   . Intertrochanteric fracture of right femur ([place])   . Myocardial infarction   . Osteoporosis   . Radiation 04/02/15-05/29/15   vulvar region, inguinal lymph node 60 Gu  . Stroke (cerebrum) ([place]) 01/2016  . Stroke [facility]) 1978   no deficits  . Vulvar cancer [facility])     Patient Active Problem List   Diagnosis Date Noted  . Heart failure ([place]) 05/04/2016  . Abnormality of gait 03/25/2016  . CKD (chronic kidney disease) stage 4, GFR 15-29 ml/min (HCC) 03/14/2016  . Acute on chronic kidney failure ([place])   . Intracerebral hemorrhage 02/22/2016  .  Cerebellar hemorrhage ([place])   . Olecranon fracture   . Intracranial hemorrhage () 02/20/2016  . Near syncope 02/20/2016  . 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 Dysuria 05/05/2015  . Primary vulvar cancer ([place]) 03/19/2015  . Urethral lesion 03/06/2015  . Vulvar mass 03/06/2015  . Left ovarian cyst 03/06/2015  . Postmenopausal vaginal bleeding 02/25/2015  . s/p hysterectomy for prolapsed uterus 02/25/2015  . Vaginal mass 02/25/2015  . History of repair for rectocele 02/25/2015  . Living accommodation issues 12/18/2014  . Knee pain, bilateral 01/09/2014  . Rotator cuff syndrome of both shoulders 01/09/2014  . Arthritis 12/02/2013  . DNR (do not resuscitate) discussion 12/02/2013  . Intertrochanteric fracture of right hip ([place]) 08/29/2013  . History of UTI 06/12/2013  . Routine health maintenance 06/12/2013  . Renal insufficiency 02/28/2013  . Dyslipidemia 02/28/2013  . Benign hypertension with CKD (chronic kidney disease) stage IV [facility])     Past Surgical History:  Procedure Laterality Date  . ABDOMINAL HYSTERECTOMY    . APPENDECTOMY    . HERNIA REPAIR    . INTRAMEDULLARY (IM) NAIL INTERTROCHANTERIC Right 08/29/2013   Procedure: INTRAMEDULLARY (IM) NAIL INTERTROCHANTRIC;  Surgeon: Nita Sells, MD;  Location: [facility];  Service: Orthopedics;  Laterality: Right;  . TUBAL LIGATION      OB History    No data available       Home Medications    Prior to Admission medications   Medication Sig Start Date  End Date Taking? Authorizing Provider  acetaminophen (TYLENOL) 325 MG tablet Take 650 mg by mouth every 6 (six) hours as needed for mild pain.    Historical Provider, MD  Cholecalciferol (VITAMIN D3) 1000 units CAPS Take by mouth.    Historical Provider, MD  Cholecalciferol (VITAMIN D3) 3000 units TABS Take by mouth.    Historical Provider, MD  hydrALAZINE (APRESOLINE) 25 MG tablet Take 1 tablet (25 mg total) by mouth 3 (three) times daily. 03/23/16   [name], MD    loratadine (CLARITIN) 10 MG tablet Take 1 tablet (10 mg total) by mouth daily. Patient not taking: Reported on 03/28/2016 12/17/14   [name], MD  omeprazole (PRILOSEC) 20 MG capsule Take 1 capsule (20 mg total) by mouth daily. 03/31/16   Zenia Resides, MD  ondansetron (ZOFRAN) 4 MG tablet Take 1 tablet (4 mg total) by mouth every 8 (eight) hours as needed for nausea or vomiting. 04/27/16   [name], MD  promethazine (PHENERGAN) 12.5 MG suppository Place 1 suppository (12.5 mg total) rectally every 8 (eight) hours as needed for nausea or vomiting. 04/27/16   [name], MD  saccharomyces boulardii (FLORASTOR) 250 MG capsule Take 250 mg by mouth daily.    Historical Provider, MD  traMADol Veatrice Bourbon) 50 MG tablet Take one tablet by mouth every eight hours for pain 03/02/16   Estill Dooms, MD  triamcinolone cream (KENALOG) 0.1 % Apply to itchy areas 2 or 3 times a day as needed 03/23/16   [name], MD  verapamil (VERELAN PM) 240 MG 24 hr capsule TAKE 1 CAPSULE BY MOUTH DAILY 02/22/16   [name], MD  vitamin E 400 UNIT capsule Take by mouth.    Historical Provider, MD    Family History Family History  Problem Relation Age of Onset  . Diabetes Mother   . Uterine cancer Sister     Social History Social History  Substance Use Topics  . Smoking status: Never Smoker  . Smokeless tobacco: Former Systems developer    Types: Snuff  . Alcohol use No     Allergies   Codeine and Penicillins   Review of Systems Review of Systems  Constitutional: Positive for fatigue. Negative for chills, diaphoresis and fever.  HENT: Negative for congestion.   Respiratory: Positive for cough and shortness of breath. Negative for sputum production, chest tightness and wheezing.   Cardiovascular: Negative for chest pain, claudication and leg swelling.  Gastrointestinal: Negative for abdominal pain, nausea and vomiting.  Genitourinary: Negative for flank pain.  Musculoskeletal: Negative for back pain.  Skin:  Negative for rash.  Neurological: Negative for headaches.  Psychiatric/Behavioral: Negative for confusion.     Physical Exam Updated Vital Signs BP 179/91   Pulse 92   Temp 97.5 F (36.4 C) (Oral)   Resp (!) 34   Wt 46.7 kg   SpO2 92%   BMI 18.84 kg/m   Physical Exam  Constitutional: She is oriented to person, place, and time. She appears well-developed and well-nourished. No distress.  Pleasant, cooperative, appears dyspneic speaking 2-4 word sentences  HENT:  Head: Normocephalic and atraumatic.  Eyes: Conjunctivae are normal. No scleral icterus.  Neck: Normal range of motion. Neck supple. JVD present. No tracheal deviation present.  Cardiovascular: Normal rate, regular rhythm and intact distal pulses.   Murmur heard. HR 90's  Pulmonary/Chest: Effort normal. No respiratory distress. She has rales.  Rales b/l bases. tachypneic with rate 35-38 on RA at  rest  Abdominal: Soft. She exhibits no distension. There is no tenderness.  Musculoskeletal: She exhibits no edema or tenderness.  Symmetric size and appearance of b/l Le's, no calf swelling or tenderness  Neurological: She is alert and oriented to person, place, and time. She exhibits normal muscle tone. Coordination normal.  Skin: Skin is warm. No rash noted. She is not diaphoretic.  Psychiatric: She has a normal mood and affect.  Nursing note and vitals reviewed.    ED Treatments / Results  Labs (all labs ordered are listed, but only abnormal results are displayed) Labs Reviewed  BASIC METABOLIC PANEL - Abnormal; Notable for the following:       Result Value   CO2 21 (*)    BUN 35 (*)    Creatinine, Ser 2.49 (*)    GFR calc non Af Amer 16 (*)    GFR calc Af Amer 19 (*)    All other components within normal limits  CBC - Abnormal; Notable for the following:    Hemoglobin 11.5 (*)    HCT 35.5 (*)    All other components within normal limits  BRAIN NATRIURETIC PEPTIDE - Abnormal; Notable for the following:    B  Natriuretic Peptide 1,526.1 (*)    All other components within normal limits  MAGNESIUM  I-STAT TROPOININ, ED  I-STAT TROPOININ, ED    EKG  EKG Interpretation None       Radiology Dg Chest 2 View  Result Date: 05/04/2016 CLINICAL DATA:  SOB since this AM. Pt denies CP. Hx of HTN, COPD, CVA, stroke, MI. Nonsmoker. EXAM: CHEST  2 VIEW COMPARISON:  08/30/2013 FINDINGS: Heart is enlarged. Perihilar peribronchial thickening is noted and stable. There is increased opacity at the right lung base consistent with infiltrate. There are bilateral pleural effusions. IMPRESSION: 1. Cardiomegaly. 2. Right lower lobe infiltrate and bilateral pleural effusions. Electronically Signed   By: Nolon Nations M.D.   On: 05/04/2016 17:25    Procedures Procedures (including critical care time)  Medications Ordered in ED Medications  cefTRIAXone (ROCEPHIN) 1 g in dextrose 5 % 50 mL IVPB (0 g Intravenous Stopped 05/04/16 2120)  azithromycin (ZITHROMAX) 500 mg in dextrose 5 % 250 mL IVPB (500 mg Intravenous New Bag/Given 05/04/16 2047)  nitroGLYCERIN (NITROGLYN) 2 % ointment 1 inch (1 inch Topical Given 05/04/16 2201)  furosemide (LASIX) injection 20 mg (20 mg Intravenous Given 05/04/16 2201)  ondansetron (ZOFRAN) injection 4 mg (4 mg Intravenous Given 05/04/16 2201)     Initial Impression / Assessment and Plan / ED Course  I have reviewed the triage vital signs and the nursing notes.  Pertinent labs & imaging results that were available during my care of the patient were reviewed by me and considered in my medical decision making (see chart for details).  Clinical Course   Aritza L Zaragoza is a 82 y.o. female with CKD stage 4, essential HTN (PCP okay with SBP 170's), recurrent vulvar cancer and cysts of left ovary with multiple b/l LN involvement, on palliative care, followed by Dr. Denman George (GYN oncology) and Dr. [name]. ECHO 02/2013 with mild LVH, LVEF [id number], grade 1 diastolic dysfunction. She  presents to ED today for evaluation of new-onset dyspnea that she awoke with this morning and has been progressively worsening since onset. States she was feeling at baseline last night prior to going to bed without dyspnea, however, with mild cough. On exam, has crackles and clinically dyspneic with O2 sat 91-93% on Ra while at rest.  Not on home oxygen. Given R>L infiltrates on CXR, empirically treated for CAP with rocephin and azithromycin, however, concern for acute heart failure with BNP 1500. Given 20mg  IV lasix and topical nitro paste. Denies and recent episodes over past couple weeks of chest pain or discomfort. Also needs PE rule-out, but unable to obtain CTA 2/2 renal function. Will admit for further management. Would benefit ECHO and PE workup, further observation from possible pneumonia standpoint as well.  Pt condition, course, and admission were discussed with attending physician Dr. Ezequiel Essex.  Final Clinical Impressions(s) / ED Diagnoses   Final diagnoses:  Heart failure, unspecified heart failure chronicity, unspecified heart failure type [facility])    New Prescriptions New Prescriptions   No medications on file       Paralee Cancel, MD 05/04/16 2213    Ezequiel Essex, MD 05/05/16 0140

## 2016-05-04 NOTE — ED Triage Notes (Signed)
Pt reports SOB that started today. Cough present. Pt denies pain. Pt states "it just feels full"

## 2016-05-05 ENCOUNTER — Inpatient Hospital Stay (HOSPITAL_COMMUNITY): Payer: Medicare Other

## 2016-05-05 DIAGNOSIS — I509 Heart failure, unspecified: Secondary | ICD-10-CM

## 2016-05-05 DIAGNOSIS — R0602 Shortness of breath: Secondary | ICD-10-CM

## 2016-05-05 LAB — CBC
HCT: 32.5 % — ABNORMAL LOW (ref 36.0–46.0)
Hemoglobin: 10.4 g/dL — ABNORMAL LOW (ref 12.0–15.0)
MCH: 28.1 pg (ref 26.0–34.0)
MCHC: 32 g/dL (ref 30.0–36.0)
MCV: 87.8 fL (ref 78.0–100.0)
PLATELETS: 226 10*3/uL (ref 150–400)
RBC: 3.7 MIL/uL — ABNORMAL LOW (ref 3.87–5.11)
RDW: 13.4 % (ref 11.5–15.5)
WBC: 7.1 10*3/uL (ref 4.0–10.5)

## 2016-05-05 LAB — BASIC METABOLIC PANEL
Anion gap: 8 (ref 5–15)
BUN: 33 mg/dL — AB (ref 6–20)
CO2: 25 mmol/L (ref 22–32)
CREATININE: 2.54 mg/dL — AB (ref 0.44–1.00)
Calcium: 8.9 mg/dL (ref 8.9–10.3)
Chloride: 107 mmol/L (ref 101–111)
GFR calc Af Amer: 18 mL/min — ABNORMAL LOW (ref >60)
GFR, EST NON AFRICAN AMERICAN: 16 mL/min — AB (ref >60)
Glucose, Bld: 101 mg/dL — ABNORMAL HIGH (ref 65–99)
Potassium: 4.4 mmol/L (ref 3.5–5.1)
SODIUM: 140 mmol/L (ref 135–145)

## 2016-05-05 LAB — D-DIMER, QUANTITATIVE: D-Dimer, Quant: 0.75 ug/mL-FEU — ABNORMAL HIGH (ref 0.00–0.50)

## 2016-05-05 LAB — CREATININE, SERUM
CREATININE: 2.49 mg/dL — AB (ref 0.44–1.00)
GFR calc non Af Amer: 16 mL/min — ABNORMAL LOW (ref >60)
GFR, EST AFRICAN AMERICAN: 19 mL/min — AB (ref >60)

## 2016-05-05 MED ORDER — TECHNETIUM TC 99M DIETHYLENETRIAME-PENTAACETIC ACID: 31.3000 | Freq: Once | INTRAVENOUS | Status: DC | PRN

## 2016-05-05 MED ORDER — FUROSEMIDE 10 MG/ML IJ SOLN
20.0000 mg | Freq: Every day | INTRAMUSCULAR | Status: DC
  Administered 2016-05-05 – 2016-05-06 (×2): 20 mg via INTRAVENOUS
  Filled 2016-05-05 (×2): qty 2

## 2016-05-05 MED ORDER — TECHNETIUM TO 99M ALBUMIN AGGREGATED
4.1000 | Freq: Once | INTRAVENOUS | Status: AC | PRN
  Administered 2016-05-05: 4.1 via INTRAVENOUS

## 2016-05-05 NOTE — Evaluation (Signed)
Physical Therapy Evaluation Patient Details Name: Meredith Bender MRN: [id number] DOB: 1925/09/11 Today's Date: 05/05/2016   History of Present Illness  Meredith Bender is a 82 y.o. female presenting with shortness of breath . PMH is significant for CHF, CKD stage IV, Right hip fx, vulvovaginal cancer, HTN.   Clinical Impression  Pt presents with dependencies in mobility affecting her balance secondary to SOB. Pt is able to ambulate in the halls with supervision and RW 100 feet. O2 sats at 94% on RA with activity. Pt demonstrated general deconditioning and decreased activity tolerance. Pt lives alone and feel she would benefit from ambulation with a RW and HHPT follow-up. Pt would benefit from continued skilled PT to maximize mobility and independence for return home. Recommend frequent ambulation on the unit with the nursing and RW.    Follow Up Recommendations Home health PT    Equipment Recommendations  Rolling walker with 5" wheels    Recommendations for Other Services OT consult     Precautions / Restrictions Precautions Precautions: Fall Restrictions Weight Bearing Restrictions: No      Mobility  Bed Mobility Overal bed mobility: Independent                Transfers Overall transfer level: Modified independent Equipment used: Rolling walker (2 wheeled) Transfers: Sit to/from Stand Sit to Stand: Supervision         General transfer comment: cues for handplacement for increased safety  Ambulation/Gait Ambulation/Gait assistance: Supervision Ambulation Distance (Feet): 100 Feet Assistive device: Rolling walker (2 wheeled) Gait Pattern/deviations: Step-through pattern;Decreased stride length;Shuffle;Narrow base of support;Trunk flexed Gait velocity: decreased   General Gait Details: when cued pt will increase her stride length and widen her base of support. Pt verbalized she broke her hip before and now takes little steps.  Stairs            Wheelchair  Mobility    Modified Rankin (Stroke Patients Only)       Balance Overall balance assessment: Needs assistance Sitting-balance support: Bilateral upper extremity supported Sitting balance-Leahy Scale: Good     Standing balance support: Bilateral upper extremity supported Standing balance-Leahy Scale: Fair                               Pertinent Vitals/Pain Pain Assessment: No/denies pain    Home Living Family/patient expects to be discharged to:: Private residence Living Arrangements: Alone Available Help at Discharge: Family;Available PRN/intermittently Type of Home: House Home Access: Level entry     Home Layout: One level Home Equipment: Cane - single point      Prior Function Level of Independence: Independent               Hand Dominance        Extremity/Trunk Assessment               Lower Extremity Assessment: Overall WFL for tasks assessed      Cervical / Trunk Assessment: Kyphotic  Communication   Communication: No difficulties  Cognition Arousal/Alertness: Awake/alert Behavior During Therapy: WFL for tasks assessed/performed Overall Cognitive Status: Within Functional Limits for tasks assessed                      General Comments General comments (skin integrity, edema, etc.): O2 sats remained at 94% with gait and no SOB noted other than general fatigue from activity.    Exercises     Assessment/Plan  PT Assessment Patient needs continued PT services  PT Problem List Decreased activity tolerance;Decreased balance;Decreased mobility;Decreased knowledge of use of DME;Decreased strength;Decreased range of motion          PT Treatment Interventions DME instruction;Gait training;Therapeutic activities;Therapeutic exercise;Balance training;Patient/family education    PT Goals (Current goals can be found in the Care Plan section)  Acute Rehab PT Goals Patient Stated Goal: To return home PT Goal Formulation:  With patient Time For Goal Achievement: 05/19/16 Potential to Achieve Goals: Good    Frequency Min 3X/week   Barriers to discharge Decreased caregiver support lives alone    Co-evaluation               End of Session Equipment Utilized During Treatment: Gait belt Activity Tolerance: Patient tolerated treatment well Patient left: in chair;with call bell/phone within reach Nurse Communication: Mobility status (frequent ambulation with staff and RW)         [id number] PT Time Calculation (min) (ACUTE ONLY): 34 min   Charges:   PT Evaluation $PT Eval Moderate Complexity: 1 Procedure PT Treatments $Gait Training: 8-22 mins   PT G Codes:        [name] 05/05/2016, 8:32 AM

## 2016-05-05 NOTE — Progress Notes (Signed)
Family Medicine Teaching Service Daily Progress Note Intern Pager: [phone]  Patient name: Meredith Bender [place] Medical record number: [id number] Date of birth: 1926/01/04 Age: 82 y.o. Gender: female  Primary Care Provider: [name], MD Consultants: None Code Status: DNR  Pt Overview and Major Events to Date:  Admit 05/04/2016  Assessment and Plan: Naidelin Hauenstein Pilling is a 82 y.o. female presenting with shortness of breath . PMH is significant for CHF, CKD stage IV, Right hip fx, vulvovaginal cancer, HTN.   #Shortness of breath.  S/p IV Lasix 20 mg and Zofran in ED.  On 2L O2 and satting 92%; new oxygen requirement.  No O2 requirement at home.  Tachypneic.  I-stat troponin in ED <0.03.  Concern for PE given history of vulvovaginal cancer and recent diagnosis of spread to lymph nodes. Wells score 4.0. Last echo 02/2013 showing G1DD with EF 50-55%.   BNP 1526. Mag 2.1 wnl.   Afebrile with no white count.  CXR with increased opacity at right lung base consistent with infiltrate and bilateral pleural effusions. Dyspnea most likely 2/2 CHF exacerbation. Need to r/o acute PE. Given R>L infiltrates on CXR, empirically treated for CAP with rocephin and azithromycin in ED. H/o COPD in chart but patient denies diagnosis (no history of smoking). D-dimer elevated to 0.75 so VQ scan ordered given her renal function, avoid CT dye.  For age adjusted d-dimer (0.88) , d-dimer is within normal limits so may not need to VQ scan.  This AM satting well on RA.  -repeat Echo -VQ scan pending read -Zofran 4 mg prn for nausea -Continue IV Lasix 20 mg daily and adjust as needed based on UOP -Daily weights -I's and O's -Tylenol 650 mg prn -AM BMP -PT/OT eval and treat -vitals per unit routine  #Cough 2/2 CAP.  CXR showing increased opacity at right lung base consistent with infiltrate and bilateral pleural effusions. S/P Azithromycin and CTX in ED.  Afebrile with no white count.  -continue treatment for community acquired  pneumonia -monitor for fevers  #CKD stage IV. Creatinine on admission 2.49.  This AM 2.54.  Baseline 2.0-2.5.  -Daily BMET -Avoid nephrotoxic agents  #HTN.  BP on admission 184/122.  Patient with hx of SBP usually around 170 and without symptoms.  -Continue home Verapamil 240 mg daily -Continue home Hydralazine 25 mg Q8  #Hx vulvovaginal cancer.  S/p chemoradiation.  With recent diagnosis of cancer in her lymph nodes. -continue home Tramadol 50 mg Q8 as needed for pain -family seeking palliative care -Palliative care c/s in AM  FEN/GI: Regular diet , SLIV Prophylaxis: Lovenox, Protonix  Disposition: Continue to monitor inpatient for further workup. Dispo pending clinical improvement.   Subjective:  Patient states she is doing better this AM.  Eating breakfast.  On RA and satting 98%.  No swelling in legs. No complaints at this time.   Objective: Temp:  [97.2 F (36.2 C)-97.9 F (36.6 C)] 97.2 F (36.2 C) (10/19 0758) Pulse Rate:  [72-92] 72 (10/19 0902) Resp:  [18-38] 20 (10/19 0902) BP: (150-233)/(67-122) 150/70 (10/19 0902) SpO2:  [90 %-99 %] 99 % (10/19 0902) Weight:  [99 lb 4.8 oz (45 kg)-103 lb (46.7 kg)] 99 lb 4.8 oz (45 kg) (10/19 0408) Physical Exam: General: elderly female sitting in chair in no acute distress, eating breakfast Eyes: EOMI, PERRL, no conjunctival injection ENTM: MMM, oropharynx clear, no exudates Neck: normal, supple Cardiovascular: RRR, no MRG, peripheral pulses detectable Respiratory: CTA B/L, normal effort of breathing on RA Gastrointestinal:  soft, NT ND, +bs, no masses MSK: no edema or tenderness, good ROM in all 4 extremities Derm: warm, well perfused, not diaphoretic Neuro: AAOx3.  No focal deficits.  Strength 5/5 in upper and lower extremities Psych: Normal mood and affect, pleasant  Laboratory:  Recent Labs Lab 05/04/16 1715 05/05/16 0429  WBC 8.5 7.1  HGB 11.5* 10.4*  HCT 35.5* 32.5*  PLT 252 226    Recent Labs Lab  05/04/16 1715 05/04/16 2334 05/05/16 0429  NA 139  --  140  K 4.6  --  4.4  CL 109  --  107  CO2 21*  --  25  BUN 35*  --  33*  CREATININE 2.49* 2.49* 2.54*  CALCIUM 8.9  --  8.9  GLUCOSE 95  --  101*   Imaging/Diagnostic Tests: Dg Chest 2 View  Result Date: 05/04/2016 CLINICAL DATA:  SOB since this AM. Pt denies CP. Hx of HTN, COPD, CVA, stroke, MI. Nonsmoker. EXAM: CHEST  2 VIEW COMPARISON:  08/30/2013 FINDINGS: Heart is enlarged. Perihilar peribronchial thickening is noted and stable. There is increased opacity at the right lung base consistent with infiltrate. There are bilateral pleural effusions. IMPRESSION: 1. Cardiomegaly. 2. Right lower lobe infiltrate and bilateral pleural effusions. Electronically Signed   By: Nolon Nations M.D.   On: 05/04/2016 17:25   Nm Pulmonary Perf And Vent  Result Date: 05/05/2016 CLINICAL DATA:  Shortness of breath since 05/04/2016. History of tobacco use, chronic kidney disease, and COPD. EXAM: NUCLEAR MEDICINE VENTILATION - PERFUSION LUNG SCAN TECHNIQUE: Ventilation images were obtained in multiple projections using inhaled aerosol Tc-27m DTPA. Perfusion images were obtained in multiple projections after intravenous injection of Tc-31m MAA. RADIOPHARMACEUTICALS:  31.3 mCi Technetium-70m DTPA aerosol inhalation and 4.1 mCi Technetium-27m MAA IV COMPARISON:  Chest x-ray 05/04/2016 FINDINGS: Ventilation: No focal ventilation defect. Ventilation slightly patchy consistent with history of COPD. Perfusion: No wedge shaped peripheral perfusion defects to suggest acute pulmonary embolism. IMPRESSION: Low probability for clinically significant pulmonary embolus. Electronically Signed   By: Nolon Nations M.D.   On: 05/05/2016 08:48   Lovenia Kim, MD 05/05/2016, 11:43 AM PGY-1, [place] Intern pager: [phone], text pages welcome

## 2016-05-05 NOTE — Evaluation (Signed)
Occupational Therapy Evaluation Patient Details Name: Meredith Bender MRN: [id number] DOB: [birth year] Today's Date: 05/05/2016    History of Present Illness Meredith Bender is a 82 y.o. female presenting with shortness of breath . PMH is significant for CHF, CKD stage IV, Right hip fx, vulvovaginal cancer, HTN.    Clinical Impression   Pt was independent in self care and assisted with IADL prior to admission. She presents with generalized weakness, impaired balance and impaired memory. Overall, pt is functioning at a supervision level. Recommended pt use her shower seat and RW for energy conservation at safety and shower with supervision. Will defer further safety education to [facility].    Follow Up Recommendations  Home health OT;Supervision/Assistance - 24 hour    Equipment Recommendations       Recommendations for Other Services       Precautions / Restrictions Precautions Precautions: Fall Restrictions Weight Bearing Restrictions: No      Mobility Bed Mobility Overal bed mobility: Independent                Transfers Overall transfer level: Needs assistance Equipment used: Rolling walker (2 wheeled) Transfers: Sit to/from Stand Sit to Stand: Supervision         General transfer comment: supervision for safety, cues for hand placement    Balance Overall balance assessment: Needs assistance Sitting-balance support: Bilateral upper extremity supported Sitting balance-Leahy Scale: Good     Standing balance support: Bilateral upper extremity supported Standing balance-Leahy Scale: Fair                              ADL Overall ADL's : Needs assistance/impaired Eating/Feeding: Set up;Sitting   Grooming: Wash/dry hands;Standing;Supervision/safety   Upper Body Bathing: Supervision/ safety;Sitting   Lower Body Bathing: Supervison/ safety;Sit to/from stand   Upper Body Dressing : Supervision/safety;Sitting       Toilet Transfer:  Supervision/safety;Ambulation;RW   Toileting- Clothing Manipulation and Hygiene: Supervision/safety;Sit to/from stand       Functional mobility during ADLs: Supervision/safety;Rolling walker General ADL Comments: [employer] aide in room confirming pt's responses.     Vision     Perception     Praxis      Pertinent Vitals/Pain Pain Assessment: Faces Faces Pain Scale: Hurts even more Pain Location: head Pain Descriptors / Indicators: Aching Pain Intervention(s): Monitored during session;Patient requesting pain meds-RN notified     Hand Dominance Right   Extremity/Trunk Assessment Upper Extremity Assessment Upper Extremity Assessment: Generalized weakness   Lower Extremity Assessment Lower Extremity Assessment: Defer to PT evaluation   Cervical / Trunk Assessment Cervical / Trunk Assessment: Kyphotic   Communication Communication Communication: No difficulties   Cognition Arousal/Alertness: Awake/alert Behavior During Therapy: WFL for tasks assessed/performed Overall Cognitive Status: Impaired/Different from baseline Area of Impairment: Memory     Memory: Decreased short-term memory             General Comments       Exercises       Shoulder Instructions      Home Living Family/patient expects to be discharged to:: Private residence Living Arrangements: Alone Available Help at Discharge: Family;Available PRN/intermittently;Personal care attendant ([employer] assists with light housekeeping, errands) Type of Home: House Home Access: Level entry     Home Layout: One level     Bathroom Shower/Tub: Tub/shower unit;Curtain   Biochemist, clinical: Standard     Home Equipment: Cane - single point;Shower seat   Additional Comments:  does not use the shower seat      Prior Functioning/Environment Level of Independence: Needs assistance    ADL's / Homemaking Assistance Needed: Independent in self care, assist for errands, housekeeping and some  cooking            OT Problem List: Decreased strength;Decreased activity tolerance;Impaired balance (sitting and/or standing);Decreased cognition;Decreased knowledge of use of DME or AE;Cardiopulmonary status limiting activity;Pain   OT Treatment/Interventions:      OT Goals(Current goals can be found in the care plan section) Acute Rehab OT Goals Patient Stated Goal: To return home tomorrow  OT Frequency:     Barriers to D/C:            Co-evaluation              End of Session Equipment Utilized During Treatment: Gait belt;Rolling walker Nurse Communication:  (ok to have coffee)  Activity Tolerance: Patient limited by fatigue Patient left: in bed;with call bell/phone within reach;with nursing/sitter in room;with family/visitor present   Time: [id number] OT Time Calculation (min): 18 min Charges:  OT General Charges $OT Visit: 1 Procedure OT Evaluation $OT Eval Moderate Complexity: 1 Procedure G-Codes:    Meredith Bender 05/05/2016, 9:26 AM  [phone]

## 2016-05-05 NOTE — Progress Notes (Addendum)
Palliative Medicine RN Note:  Rec'd call from Dr Reesa Chew regarding PMT referral. Spoke with resident on call and explained that we will be able to see the pt, but due to high referral volume, it will likely be tomorrow. He will put in the PMT order as discussed, and we will see the patient as soon as we have an available provider. Please call the Palliative Medicine Team office at [phone] if recommendations are needed in the interim.  Thank you for inviting [place] to see this patient.  Marjie Skiff Doralee Kocak, RN, BSN, [facility] 05/05/2016 1:04 PM Cell [phone] 8:00-4:00 Monday-Friday Office [phone]

## 2016-05-05 NOTE — Progress Notes (Signed)
Transitions of Care Pharmacy Note  Plan:  Educated on potential need for blood thinners depending on results of imaging. ------  Meredith Bender is an 82 y.o. female who presents with a chief complaint SOB. In anticipation of discharge, pharmacy has reviewed this patient's prior to admission medication history, as well as current inpatient medications listed per the [facility].  Current medication indications, dosing, frequency, and notable side effects reviewed with patient and family. patient verbalized understanding of current inpatient medication regimen and is aware that the After Visit Summary when presented, will represent the most accurate medication list at discharge.   Meredith Bender expressed no concerns regarding medication    Assessment: Understanding of regimen: good Understanding of indications: good Potential of compliance: good Barriers to Obtaining Medications: No  Patient instructed to contact inpatient pharmacy team with further questions or concerns if needed.    Time spent preparing for discharge counseling: 10 mins Time spent counseling patient: 10 mins   Thank you for allowing pharmacy to be a part of this patient's care.  Meredith Bender, Pharm.D. PGY1 Pharmacy Resident 10/19/20176:21 PM Pager [phone]

## 2016-05-06 ENCOUNTER — Inpatient Hospital Stay (HOSPITAL_COMMUNITY): Payer: Medicare Other

## 2016-05-06 ENCOUNTER — Other Ambulatory Visit (HOSPITAL_COMMUNITY): Payer: Medicare Other

## 2016-05-06 ENCOUNTER — Other Ambulatory Visit: Payer: Self-pay | Admitting: Family Medicine

## 2016-05-06 DIAGNOSIS — C799 Secondary malignant neoplasm of unspecified site: Secondary | ICD-10-CM

## 2016-05-06 DIAGNOSIS — I509 Heart failure, unspecified: Secondary | ICD-10-CM

## 2016-05-06 LAB — BASIC METABOLIC PANEL
Anion gap: 8 (ref 5–15)
BUN: 36 mg/dL — AB (ref 6–20)
CALCIUM: 8.5 mg/dL — AB (ref 8.9–10.3)
CO2: 23 mmol/L (ref 22–32)
CREATININE: 2.84 mg/dL — AB (ref 0.44–1.00)
Chloride: 108 mmol/L (ref 101–111)
GFR calc Af Amer: 16 mL/min — ABNORMAL LOW (ref >60)
GFR, EST NON AFRICAN AMERICAN: 14 mL/min — AB (ref >60)
Glucose, Bld: 101 mg/dL — ABNORMAL HIGH (ref 65–99)
Potassium: 4.2 mmol/L (ref 3.5–5.1)
SODIUM: 139 mmol/L (ref 135–145)

## 2016-05-06 LAB — CBC
HCT: 33.9 % — ABNORMAL LOW (ref 36.0–46.0)
Hemoglobin: 10.9 g/dL — ABNORMAL LOW (ref 12.0–15.0)
MCH: 28.2 pg (ref 26.0–34.0)
MCHC: 32.2 g/dL (ref 30.0–36.0)
MCV: 87.8 fL (ref 78.0–100.0)
PLATELETS: 240 10*3/uL (ref 150–400)
RBC: 3.86 MIL/uL — ABNORMAL LOW (ref 3.87–5.11)
RDW: 13.6 % (ref 11.5–15.5)
WBC: 6.1 10*3/uL (ref 4.0–10.5)

## 2016-05-06 LAB — ECHOCARDIOGRAM COMPLETE
Height: 65 in
WEIGHTICAEL: 1571.2 [oz_av]

## 2016-05-06 MED ORDER — FUROSEMIDE 40 MG PO TABS
40.0000 mg | ORAL_TABLET | Freq: Every day | ORAL | Status: DC
  Administered 2016-05-07: 40 mg via ORAL
  Filled 2016-05-06: qty 1

## 2016-05-06 MED ORDER — DIPHENHYDRAMINE HCL 25 MG PO CAPS
25.0000 mg | ORAL_CAPSULE | Freq: Once | ORAL | Status: AC
  Administered 2016-05-06: 25 mg via ORAL
  Filled 2016-05-06: qty 1

## 2016-05-06 MED ORDER — FUROSEMIDE 40 MG PO TABS: 40.0000 mg | ORAL_TABLET | Freq: Every day | ORAL | 0 refills | Status: DC

## 2016-05-06 NOTE — Progress Notes (Signed)
  Echocardiogram 2D Echocardiogram has been performed.  Meredith Bender 05/06/2016, 2:14 PM

## 2016-05-06 NOTE — Progress Notes (Addendum)
Notified by Olga Coaster, CMRN of family request for [facility] [place] [facility] at home after discharge. Chart and patient information currently under review to confirm hospice eligibility.  Spoke with patient, daughter/HCPOA Rickie and son Patrick Jupiter, at bedside to initiate education related to hospice philosophy, services and team approach to care. Family verbalized understanding of the information provided. Per discussion plan is for discharge to home by personal vehicle daughter tomorrow 05/07/16.   Please send signed completed DNR form home with patient.   Patient will need prescriptions for discharge comfort medications.   DME needs discussed and patient and family decline any needs at this time. Daughter has requested O2 to be delivered to the home for prn use. [facility] will contact Rickie for delivery.  The home address has been verified and is correct in the chart.  [facility] aware of the above.   Completed discharge summary will need to be faxed to [facility] at [phone] when final.   Please notify [facility] when patient is ready to leave unit at discharge-call [phone].   [facility] information and contact numbers have been given to daughter [name] and son Patrick Jupiter during visit.   Above information shared with Olga Coaster, CMRN.  Please call with any questions.  Thank You,  Margaretmary Eddy, Rn, [facility] Liaison  [phone]

## 2016-05-06 NOTE — Discharge Instructions (Signed)
You were admitted to the hospital for shortness of breath.  This was likely due to your heart not being able to pump as effectively as before and fluid accumulation in your lungs.  You were given Lasix to take some of that fluid off and help you to breathe better. You were also given supplemental oxygen while you were here . You began to do better and no longer needed the oxygen.  An echocardiogram was performed which takes a look at your heart to see how well it is functioning. You also saw palliative care services while you were here, and they determined that home hospice would be a great idea for you.  We will have that arranged on your discharge.   You can follow up with your PCP Dr. Nori Riis at the appointment provided.  We are glad you are feeling better!

## 2016-05-06 NOTE — Progress Notes (Signed)
Patient complaining of itching and a rash on bilateral shoulders.  She stated it started right before admission to the hospital and that she hasn't started anything new.  MD on call paged and made aware.  Will continue to monitor.

## 2016-05-06 NOTE — Progress Notes (Signed)
All arrangements have been made for patient to go home with hospice care - [facility] [place]; no DME needed; for possible discharge home tomorrow. Mindi Slicker [facility] [phone]

## 2016-05-06 NOTE — Progress Notes (Signed)
Family Medicine Teaching Service Daily Progress Note Intern Pager: [phone]  Patient name: Meredith Bender Medical record number: [id number] Date of birth: [birth year] Age: 82 y.o. Gender: female  Primary Care Provider: [name], MD Consultants: None Code Status: DNR  Pt Overview and Major Events to Date:  Admit 05/04/2016  Assessment and Plan: Meredith Bender is a 82 y.o. female presenting with shortness of breath . PMH is significant for CHF, CKD stage IV, Right hip fx, vulvovaginal cancer, HTN.   #Shortness of breath.  S/p IV Lasix 20 mg and Zofran in ED.  On 2L O2 and satting 92%; new oxygen requirement.  No O2 requirement at home.  Tachypneic.  I-stat troponin in ED <0.03.  Concern for PE given history of vulvovaginal cancer and recent diagnosis of spread to lymph nodes. Wells score 4.0. Last echo 02/2013 showing G1DD with EF 50-55%.   BNP 1526. Mag 2.1 wnl.   Afebrile with no white count.  CXR with increased opacity at right lung base consistent with infiltrate and bilateral pleural effusions. Dyspnea most likely 2/2 CHF exacerbation. Need to r/o acute PE. Given R>L infiltrates on CXR, empirically treated for CAP with rocephin and azithromycin in ED. H/o COPD in chart but patient denies diagnosis (no history of smoking). D-dimer elevated to 0.75 so VQ scan ordered given her renal function, avoid CT dye.    VQ scan low risk for PE.   This AM satting well on RA. Has not needed O2 overnight.  -repeat Echo pending -Zofran 4 mg prn for nausea -On IV Lasix 20 mg daily --> transition to 40 mg po Lasix daily for home -Daily weights (-5 lbs since admission) -I's and O's (net -2.7 L since admission) -Tylenol 650 mg prn -AM BMP -PT--> rec home health PT -Palliative seen, recommended home with home hospice. Will touch base with CM to arrange.   #CKD stage IV. Creatinine on admission 2.49.  This AM 2.54.  Baseline 2.0-2.5.  -Daily BMET -Avoid nephrotoxic agents  #HTN.  BP on admission 184/122.   Patient with hx of SBP usually around 170 and without symptoms.  -Continue home Verapamil 240 mg daily -Continue home Hydralazine 25 mg Q8  #Hx vulvovaginal cancer.  S/p chemoradiation.  With recent diagnosis of cancer in her lymph nodes. -continue home Tramadol 50 mg Q8 as needed for pain -family seeking palliative care -Palliative care c/s --> rec home with home hospice  FEN/GI: Regular diet , SLIV Prophylaxis: Lovenox, Protonix  Disposition: Likely discharge home today.  Subjective:  Patient sleeping comfortably in bed.  Stated she feels much better, has not required o2 [place] at night.  No shortness of breath. No leg swelling.  Is ready to go home.   Objective: Temp:  [97.7 F (36.5 C)-98.8 F (37.1 C)] 97.7 F (36.5 C) (10/20 0500) Pulse Rate:  [79-88] 86 (10/20 0500) Resp:  [18] 18 (10/20 0500) BP: (118-162)/(51-90) 162/90 (10/20 0500) SpO2:  [92 %-100 %] 100 % (10/20 0500) Weight:  [98 lb 3.2 oz (44.5 kg)] 98 lb 3.2 oz (44.5 kg) (10/20 0500) Physical Exam: General: elderly female lying in bed Eyes: EOMI, PERRL ENTM: MMM, oropharynx clear, no exudates Neck: normal, supple Cardiovascular: RRR, no MRG Respiratory: CTA B/L, normal effort of breathing on RA Gastrointestinal: soft, NT ND, +bs, no masses MSK: no edema or tenderness, good ROM in all 4 extremities Derm: warm, well perfused Neuro: AAOx3.  No focal deficits.  Strength 5/5 in upper and lower extremities Psych: Normal mood and  affect, pleasant  Laboratory:  Recent Labs Lab 05/04/16 1715 05/05/16 0429 05/06/16 0207  WBC 8.5 7.1 6.1  HGB 11.5* 10.4* 10.9*  HCT 35.5* 32.5* 33.9*  PLT 252 226 240    Recent Labs Lab 05/04/16 1715 05/04/16 2334 05/05/16 0429 05/06/16 0207  NA 139  --  140 139  K 4.6  --  4.4 4.2  CL 109  --  107 108  CO2 21*  --  25 23  BUN 35*  --  33* 36*  CREATININE 2.49* 2.49* 2.54* 2.84*  CALCIUM 8.9  --  8.9 8.5*  GLUCOSE 95  --  101* 101*   Imaging/Diagnostic Tests: No  results found. Lovenia Kim, MD 05/06/2016, 9:54 AM PGY-1, [place] Intern pager: [phone], text pages welcome

## 2016-05-06 NOTE — Progress Notes (Addendum)
CM talked to patient about home hospice choices; patient requested that I talk to her daughter Rickie; VM left with her daughter to call CM to arrange home hospice. Aneta Mins [phone]  12:17 Talked to daughter Rickie for home hospice choice, she requested [facility] and [place]; referral placed as requested. Mindi Slicker [facility] [phone]

## 2016-05-07 MED ORDER — FUROSEMIDE 40 MG PO TABS: 40.0000 mg | ORAL_TABLET | Freq: Every day | ORAL | Status: DC

## 2016-05-07 MED ORDER — FUROSEMIDE 40 MG PO TABS: 40.0000 mg | ORAL_TABLET | Freq: Every day | ORAL | 0 refills | Status: DC

## 2016-05-07 NOTE — Progress Notes (Signed)
Patient is alert and oriented. Patient discharged to home via wheelchair.  IV discontinued.  Discharge instructions given to patient. All questions answered. A.Keyia Moretto, RN

## 2016-05-07 NOTE — Progress Notes (Signed)
Family Medicine Teaching Service Daily Progress Note Intern Pager: [phone]  Patient name: Meredith Bender Medical record number: [id number] Date of birth: April 30, 1926 Age: 82 y.o. Gender: female  Primary Care Provider: [name], MD Consultants: None Code Status: DNR  Pt Overview and Major Events to Date:  Admit 05/04/2016  Assessment and Plan: Meredith Bender is a 82 y.o. female presenting with shortness of breath . PMH is significant for CHF, CKD stage IV, Right hip fx, vulvovaginal cancer, HTN.   #Shortness of breath likely 2/2 CHF exacerbation.  S/p IV Lasix 20 mg and Zofran in ED.  On initial presentation 2L O2 and satting 92%; With new oxygen requirement.  No O2 requirement at home.  Tachypneic.  I-stat troponin in ED <0.03.  Unlikely PE ruled out with VQ scan low risk.  Last echo 02/2013 showing G1DD with EF 50-55%.   BNP 1526. Mag 2.1 wnl.  Dyspnea most likely 2/2 CHF exacerbation. This AM satting well on RA. Has not needed O2 overnight.  -Echo 55-60% (improved from prior 50-55%) -Continue po 40 mg po Lasix daily for home -Palliative seen, recommended home with home hospice. CM has arranged home hospice and patient ready to be discharged.   #CKD stage IV. Creatinine on admission 2.49.  This AM 2.84.  Baseline 2.0-2.5.  -Avoid nephrotoxic agents  #HTN.  BP on admission 184/122.  Patient with hx of SBP usually around 170 and without symptoms.  -Continue home Verapamil 240 mg daily -Continue home Hydralazine 25 mg Q8  #Hx vulvovaginal cancer.  S/p chemoradiation.  With recent diagnosis of cancer in her lymph nodes. -continue home Tramadol 50 mg Q8 as needed for pain -family seeking palliative care -Palliative care c/s --> rec home with home hospice  FEN/GI: Regular diet , SLIV Prophylaxis: Lovenox, Protonix  Disposition: Home today with home hospice.  Subjective:  Patient sitting comfortably on chair.  Ready to go home. No complaints at this time.   Objective: Temp:   [97.6 F (36.4 C)-99.4 F (37.4 C)] 99.4 F (37.4 C) (10/21 0609) Pulse Rate:  [77-93] 77 (10/21 0609) Resp:  [18] 18 (10/21 0609) BP: (138-190)/(56-87) 178/87 (10/21 0609) SpO2:  [93 %-98 %] 96 % (10/21 0609) Weight:  [97 lb 11.2 oz (44.3 kg)] 97 lb 11.2 oz (44.3 kg) (10/21 [id number]) Physical Exam: General: elderly female sitting in chair Eyes: EOMI, PERRL ENTM: MMM, oropharynx clear Neck: normal, supple Cardiovascular: RRR, no MRG Respiratory: CTA B/L, normal effort of breathing on RA Gastrointestinal: soft, NT ND, +bs, no masses MSK: no edema or tenderness, good ROM in all 4 extremities Derm: warm, well perfused Neuro: AAOx3.  No focal deficits.  Strength 5/5 in upper and lower extremities Psych: Normal mood and affect, pleasant  Laboratory:  Recent Labs Lab 05/04/16 1715 05/05/16 0429 05/06/16 0207  WBC 8.5 7.1 6.1  HGB 11.5* 10.4* 10.9*  HCT 35.5* 32.5* 33.9*  PLT 252 226 240    Recent Labs Lab 05/04/16 1715 05/04/16 2334 05/05/16 0429 05/06/16 0207  NA 139  --  140 139  K 4.6  --  4.4 4.2  CL 109  --  107 108  CO2 21*  --  25 23  BUN 35*  --  33* 36*  CREATININE 2.49* 2.49* 2.54* 2.84*  CALCIUM 8.9  --  8.9 8.5*  GLUCOSE 95  --  101* 101*   Imaging/Diagnostic Tests: No results found. Lovenia Kim, MD 05/07/2016, 8:33 AM PGY-1, [place] Intern pager: [phone], text  pages welcome

## 2016-05-07 NOTE — Progress Notes (Signed)
Patient alert and oriented, denies pain, no c/o shortness of breath. Will continue to monitor the patient.

## 2016-05-08 ENCOUNTER — Telehealth: Payer: Self-pay | Admitting: Family Medicine

## 2016-05-08 DIAGNOSIS — C774 Secondary and unspecified malignant neoplasm of inguinal and lower limb lymph nodes: Secondary | ICD-10-CM | POA: Diagnosis not present

## 2016-05-08 DIAGNOSIS — E785 Hyperlipidemia, unspecified: Secondary | ICD-10-CM | POA: Diagnosis not present

## 2016-05-08 DIAGNOSIS — J449 Chronic obstructive pulmonary disease, unspecified: Secondary | ICD-10-CM | POA: Diagnosis not present

## 2016-05-08 DIAGNOSIS — N185 Chronic kidney disease, stage 5: Secondary | ICD-10-CM | POA: Diagnosis not present

## 2016-05-08 DIAGNOSIS — E46 Unspecified protein-calorie malnutrition: Secondary | ICD-10-CM | POA: Diagnosis not present

## 2016-05-08 DIAGNOSIS — K219 Gastro-esophageal reflux disease without esophagitis: Secondary | ICD-10-CM | POA: Diagnosis not present

## 2016-05-08 DIAGNOSIS — I509 Heart failure, unspecified: Secondary | ICD-10-CM | POA: Diagnosis not present

## 2016-05-08 DIAGNOSIS — C519 Malignant neoplasm of vulva, unspecified: Secondary | ICD-10-CM | POA: Diagnosis not present

## 2016-05-08 DIAGNOSIS — I10 Essential (primary) hypertension: Secondary | ICD-10-CM | POA: Diagnosis not present

## 2016-05-08 MED ORDER — VANCOMYCIN HCL 125 MG PO CAPS: 125.0000 mg | ORAL_CAPSULE | Freq: Four times a day (QID) | ORAL | 0 refills | Status: DC

## 2016-05-08 NOTE — Telephone Encounter (Signed)
**  After Hours/ Emergency Line Call*  Received a call to report that Aleesa L Rosenstock is having diarrhea.  Endorsing several loose stools today.  Daughter, Rickie, notes a history of C Diff in past.  She reports that stools smell and look similar to those.  Patient recently hospitalized and abx were administered.  Patient discharged to [facility], but no hospice MD yet.  PCP Dr Nori Riis.  Discussed care with Dr Nori Riis and ok to proceed with abx for CDiff.  Given recurrent infection, will treat with oral Vanc 125mg  QID x10days sent to [facility] on [address]. Red flags discussed.  Will forward to PCP.  Meds ordered this encounter  Medications  . vancomycin (VANCOCIN) 125 MG capsule    Sig: Take 1 capsule (125 mg total) by mouth 4 (four) times daily. x10 days    Dispense:  40 capsule    Refill:  0    Karma Hiney M. Lajuana Ripple, DO PGY-3, [facility]

## 2016-05-09 DIAGNOSIS — I509 Heart failure, unspecified: Secondary | ICD-10-CM | POA: Diagnosis not present

## 2016-05-09 DIAGNOSIS — C519 Malignant neoplasm of vulva, unspecified: Secondary | ICD-10-CM | POA: Diagnosis not present

## 2016-05-09 DIAGNOSIS — N185 Chronic kidney disease, stage 5: Secondary | ICD-10-CM | POA: Diagnosis not present

## 2016-05-09 DIAGNOSIS — E46 Unspecified protein-calorie malnutrition: Secondary | ICD-10-CM | POA: Diagnosis not present

## 2016-05-09 DIAGNOSIS — I10 Essential (primary) hypertension: Secondary | ICD-10-CM | POA: Diagnosis not present

## 2016-05-09 DIAGNOSIS — C774 Secondary and unspecified malignant neoplasm of inguinal and lower limb lymph nodes: Secondary | ICD-10-CM | POA: Diagnosis not present

## 2016-05-09 NOTE — Discharge Summary (Signed)
[facility] Discharge Summary  Patient name: Meredith Bender Medical record number: [id number] Date of birth: 10/16/1925 Age: 82 y.o. Gender: female Date of Admission: 05/04/2016  Date of Discharge: 05/07/2016 Admitting Physician: [name], MD  Primary Care Provider: [name], MD Consultants: Cardiology  Indication for Hospitalization: shortness of breath  Discharge Diagnoses/Problem List:  CHF CKD Stage IV Hx of vulvovaginal cancer HTN  Disposition: Home with home hospice  Discharge Condition: Stable, improved  Discharge Exam:  General: elderly female sitting in chair Eyes: EOMI, PERRL ENTM: MMM, oropharynx clear Neck: normal, supple Cardiovascular: RRR, no MRG Respiratory: CTA B/L, normal effort of breathing on RA Gastrointestinal: soft, NT ND, +bs, no masses MSK: no edema or tenderness, good ROM in all 4 extremities Derm: warm, well perfused Neuro: AAOx3. No focal deficits. Strength 5/5 in upper and lower extremities Psych: Normal mood and affect, pleasant  Brief Hospital Course:  Patient presented to ED with shortness of breath.  Received IV Lasix 20 mg in the ED.  On 2L O2 and satting 92%.  This was a new oxygen requirement for her as she is not on oxygen at home.  Troponin negative.  Concern for PE given her history of vulvovaginal cancer and recent diagnosis of spread to lymph nodes.  Last Echo in 02/2013 showing G1DD with EF 50-55% and BNP 1526.  Likely CHF exacerbation, however ruled out PE with a low-risk V/Q Scan.  Avoided CTA given her poor renal function.  Patient was on 3L per nasal cannula during hospitalization and slowly weaned off to RA.  She was gently diuresed with IV 20 mg Lasix daily and did well on this.  Over the next few days she improved and was no longer dyspneic. Had no lower extremity edema or signs of fluid overload. She was transitioned to oral Lasix 40 mg daily.  Echo was repeated as it had been a few years since  last, and showed EF 55-60% (improved from prior).  Palliative arranged for home hospice and at time of discharge, patient was stable and back to her baseline.    Issues for Follow Up:  1. None  Significant Procedures: V/Q scan  Significant Labs and Imaging:   Recent Labs Lab 05/04/16 1715 05/05/16 0429 05/06/16 0207  WBC 8.5 7.1 6.1  HGB 11.5* 10.4* 10.9*  HCT 35.5* 32.5* 33.9*  PLT 252 226 240    Recent Labs Lab 05/04/16 1715 05/04/16 2334 05/05/16 0429 05/06/16 0207  NA 139  --  140 139  K 4.6  --  4.4 4.2  CL 109  --  107 108  CO2 21*  --  25 23  GLUCOSE 95  --  101* 101*  BUN 35*  --  33* 36*  CREATININE 2.49* 2.49* 2.54* 2.84*  CALCIUM 8.9  --  8.9 8.5*  MG 2.1  --   --   --    Nm Pulmonary Perf And Vent  Result Date: 05/05/2016 CLINICAL DATA:  Shortness of breath since 05/04/2016. History of tobacco use, chronic kidney disease, and COPD. EXAM: NUCLEAR MEDICINE VENTILATION - PERFUSION LUNG SCAN TECHNIQUE: Ventilation images were obtained in multiple projections using inhaled aerosol Tc-13m DTPA. Perfusion images were obtained in multiple projections after intravenous injection of Tc-21m MAA. RADIOPHARMACEUTICALS:  31.3 mCi Technetium-34m DTPA aerosol inhalation and 4.1 mCi Technetium-41m MAA IV COMPARISON:  Chest x-ray 05/04/2016 FINDINGS: Ventilation: No focal ventilation defect. Ventilation slightly patchy consistent with history of COPD. Perfusion: No wedge shaped peripheral perfusion  defects to suggest acute pulmonary embolism. IMPRESSION: Low probability for clinically significant pulmonary embolus. Electronically Signed   By: Nolon Nations M.D.   On: 05/05/2016 08:48   Results/Tests Pending at Time of Discharge: None  Discharge Medications:    Medication List    STOP taking these medications   promethazine 12.5 MG suppository Commonly known as:  PHENERGAN     TAKE these medications   furosemide 40 MG tablet Commonly known as:  LASIX Take 1 tablet  (40 mg total) by mouth daily.   hydrALAZINE 25 MG tablet Commonly known as:  APRESOLINE Take 1 tablet (25 mg total) by mouth 3 (three) times daily.   loratadine 10 MG tablet Commonly known as:  CLARITIN Take 1 tablet (10 mg total) by mouth daily.   omeprazole 20 MG capsule Commonly known as:  PRILOSEC Take 1 capsule (20 mg total) by mouth daily.   ondansetron 4 MG tablet Commonly known as:  ZOFRAN Take 1 tablet (4 mg total) by mouth every 8 (eight) hours as needed for nausea or vomiting.   saccharomyces boulardii 250 MG capsule Commonly known as:  FLORASTOR Take 250 mg by mouth daily.   traMADol 50 MG tablet Commonly known as:  ULTRAM Take one tablet by mouth every eight hours for pain What changed:  how much to take  how to take this  when to take this  reasons to take this  additional instructions   triamcinolone cream 0.1 % Commonly known as:  KENALOG Apply to itchy areas 2 or 3 times a day as needed   verapamil 240 MG 24 hr capsule Commonly known as:  VERELAN PM TAKE 1 CAPSULE BY MOUTH DAILY   Vitamin D3 3000 units Tabs Take 3,000 Units by mouth daily.   vitamin E 400 UNIT capsule Take by mouth.      Discharge Instructions: Please refer to Patient Instructions section of EMR for full details.  Patient was counseled important signs and symptoms that should prompt return to medical care, changes in medications, dietary instructions, activity restrictions, and follow up appointments.   Follow-Up Appointments: Follow-up Information    [name], MD. Call today.   Specialties:  Family Medicine, Sports Medicine Contact information: [address] [place] [address] [phone]        [facility] .   Specialty:  Hospice and Palliative Medicine Why:  They will do your hospice care at your home Contact information: [place] [address] [phone]          Lovenia Kim, MD 05/09/2016, 12:10  AM PGY-1, [place]

## 2016-05-13 DIAGNOSIS — I509 Heart failure, unspecified: Secondary | ICD-10-CM | POA: Diagnosis not present

## 2016-05-13 DIAGNOSIS — N185 Chronic kidney disease, stage 5: Secondary | ICD-10-CM | POA: Diagnosis not present

## 2016-05-13 DIAGNOSIS — I10 Essential (primary) hypertension: Secondary | ICD-10-CM | POA: Diagnosis not present

## 2016-05-13 DIAGNOSIS — E46 Unspecified protein-calorie malnutrition: Secondary | ICD-10-CM | POA: Diagnosis not present

## 2016-05-13 DIAGNOSIS — C774 Secondary and unspecified malignant neoplasm of inguinal and lower limb lymph nodes: Secondary | ICD-10-CM | POA: Diagnosis not present

## 2016-05-13 DIAGNOSIS — C519 Malignant neoplasm of vulva, unspecified: Secondary | ICD-10-CM | POA: Diagnosis not present

## 2016-05-14 DIAGNOSIS — C774 Secondary and unspecified malignant neoplasm of inguinal and lower limb lymph nodes: Secondary | ICD-10-CM | POA: Diagnosis not present

## 2016-05-14 DIAGNOSIS — N185 Chronic kidney disease, stage 5: Secondary | ICD-10-CM | POA: Diagnosis not present

## 2016-05-14 DIAGNOSIS — C519 Malignant neoplasm of vulva, unspecified: Secondary | ICD-10-CM | POA: Diagnosis not present

## 2016-05-14 DIAGNOSIS — E46 Unspecified protein-calorie malnutrition: Secondary | ICD-10-CM | POA: Diagnosis not present

## 2016-05-14 DIAGNOSIS — I10 Essential (primary) hypertension: Secondary | ICD-10-CM | POA: Diagnosis not present

## 2016-05-14 DIAGNOSIS — I509 Heart failure, unspecified: Secondary | ICD-10-CM | POA: Diagnosis not present

## 2016-05-16 DIAGNOSIS — C774 Secondary and unspecified malignant neoplasm of inguinal and lower limb lymph nodes: Secondary | ICD-10-CM | POA: Diagnosis not present

## 2016-05-16 DIAGNOSIS — I509 Heart failure, unspecified: Secondary | ICD-10-CM | POA: Diagnosis not present

## 2016-05-16 DIAGNOSIS — I10 Essential (primary) hypertension: Secondary | ICD-10-CM | POA: Diagnosis not present

## 2016-05-16 DIAGNOSIS — N185 Chronic kidney disease, stage 5: Secondary | ICD-10-CM | POA: Diagnosis not present

## 2016-05-16 DIAGNOSIS — E46 Unspecified protein-calorie malnutrition: Secondary | ICD-10-CM | POA: Diagnosis not present

## 2016-05-16 DIAGNOSIS — C519 Malignant neoplasm of vulva, unspecified: Secondary | ICD-10-CM | POA: Diagnosis not present

## 2016-05-18 DIAGNOSIS — C774 Secondary and unspecified malignant neoplasm of inguinal and lower limb lymph nodes: Secondary | ICD-10-CM | POA: Diagnosis not present

## 2016-05-18 DIAGNOSIS — C519 Malignant neoplasm of vulva, unspecified: Secondary | ICD-10-CM | POA: Diagnosis not present

## 2016-05-18 DIAGNOSIS — K219 Gastro-esophageal reflux disease without esophagitis: Secondary | ICD-10-CM | POA: Diagnosis not present

## 2016-05-18 DIAGNOSIS — I10 Essential (primary) hypertension: Secondary | ICD-10-CM | POA: Diagnosis not present

## 2016-05-18 DIAGNOSIS — J449 Chronic obstructive pulmonary disease, unspecified: Secondary | ICD-10-CM | POA: Diagnosis not present

## 2016-05-18 DIAGNOSIS — I509 Heart failure, unspecified: Secondary | ICD-10-CM | POA: Diagnosis not present

## 2016-05-18 DIAGNOSIS — N185 Chronic kidney disease, stage 5: Secondary | ICD-10-CM | POA: Diagnosis not present

## 2016-05-18 DIAGNOSIS — E46 Unspecified protein-calorie malnutrition: Secondary | ICD-10-CM | POA: Diagnosis not present

## 2016-05-18 DIAGNOSIS — E785 Hyperlipidemia, unspecified: Secondary | ICD-10-CM | POA: Diagnosis not present

## 2016-05-21 DIAGNOSIS — C774 Secondary and unspecified malignant neoplasm of inguinal and lower limb lymph nodes: Secondary | ICD-10-CM | POA: Diagnosis not present

## 2016-05-21 DIAGNOSIS — E46 Unspecified protein-calorie malnutrition: Secondary | ICD-10-CM | POA: Diagnosis not present

## 2016-05-21 DIAGNOSIS — N185 Chronic kidney disease, stage 5: Secondary | ICD-10-CM | POA: Diagnosis not present

## 2016-05-21 DIAGNOSIS — I509 Heart failure, unspecified: Secondary | ICD-10-CM | POA: Diagnosis not present

## 2016-05-21 DIAGNOSIS — I10 Essential (primary) hypertension: Secondary | ICD-10-CM | POA: Diagnosis not present

## 2016-05-21 DIAGNOSIS — C519 Malignant neoplasm of vulva, unspecified: Secondary | ICD-10-CM | POA: Diagnosis not present

## 2016-05-23 DIAGNOSIS — N185 Chronic kidney disease, stage 5: Secondary | ICD-10-CM | POA: Diagnosis not present

## 2016-05-23 DIAGNOSIS — I509 Heart failure, unspecified: Secondary | ICD-10-CM | POA: Diagnosis not present

## 2016-05-23 DIAGNOSIS — I10 Essential (primary) hypertension: Secondary | ICD-10-CM | POA: Diagnosis not present

## 2016-05-23 DIAGNOSIS — C519 Malignant neoplasm of vulva, unspecified: Secondary | ICD-10-CM | POA: Diagnosis not present

## 2016-05-23 DIAGNOSIS — C774 Secondary and unspecified malignant neoplasm of inguinal and lower limb lymph nodes: Secondary | ICD-10-CM | POA: Diagnosis not present

## 2016-05-23 DIAGNOSIS — E46 Unspecified protein-calorie malnutrition: Secondary | ICD-10-CM | POA: Diagnosis not present

## 2016-05-26 ENCOUNTER — Telehealth: Payer: Self-pay | Admitting: *Deleted

## 2016-05-26 NOTE — Telephone Encounter (Signed)
Vickie, RN with Hospice called with several questions.  She is requesting to speak with Dr. Nori Riis.  She stated that patient's daughter requested refill on vancomycin over the weekend.  Does patient have C-Diff, has a culture been done to test for C-Diff?  She is also requesting an order to discontinue oxygen.  Patient O2 stats were 97% on last home visit 05/23/16.  She reported that patent is not SOB.   Please give her a call at [phone] or [phone] ASAP.  Derl Barrow, RN

## 2016-05-26 NOTE — Telephone Encounter (Signed)
Left vm mssg Ok to d/c or use oxygen prn Was treated empirically for c diff--treatment should have ended 11/2/20917. IF she is having sx of diarrhea again, we should get stool sample for c diff. [name]

## 2016-05-27 ENCOUNTER — Telehealth: Payer: Self-pay | Admitting: Family Medicine

## 2016-05-27 NOTE — Telephone Encounter (Signed)
Vickie, Hospice RN left voice message stating she received the voicemail from Dr. Nori Riis.  She could not get all the message due phone going out.  What she did get was order to discontinue O2 and to take supplies to pt for stool sample.  She would like a call back from Dr. Nori Riis today before 5 PM.  She also would like to were the stool sample be sent after collections.  Should the sample be brought back to [facility], [place], [employer]; she would need to complete proper paper work. Please call [phone] before 5 PM and 5 call [phone]. Derl Barrow, RN

## 2016-05-27 NOTE — Telephone Encounter (Signed)
Pt was evidently re-started EMPIRICALLY on vancomycin by Hospice doctor 05/21/2016. She is not having any diarrhea currently. Will complete it ttomorrow. Ok to d/c after that. IF she stars having diarrhea again, Hospice nurse is leaving her supplies (a stool hat collection sytem, cup and biohazard bag) so she can collect a sample, Hospice nurse would then take sample to lab and fax me report. Otherwise she is doing well. I spoke directly with Vickie.

## 2016-05-27 NOTE — Telephone Encounter (Signed)
Meredith Bender with hospice: should the pt still be taking vancomycin?  Pt has had not diarrhea all week. It may be hard to get a sample.  If she obtains the sample, where should it be sent?  Please advise Until 5 today, Meredith Bender can be reached at [phone]. After 5, [phone] on call nurse.

## 2016-06-01 ENCOUNTER — Encounter: Payer: Self-pay | Admitting: Family Medicine

## 2016-06-01 ENCOUNTER — Ambulatory Visit (INDEPENDENT_AMBULATORY_CARE_PROVIDER_SITE_OTHER): Payer: Medicare Other | Admitting: Family Medicine

## 2016-06-01 VITALS — BP 163/65 | HR 77 | Temp 98.1°F | Wt 101.0 lb

## 2016-06-01 DIAGNOSIS — I10 Essential (primary) hypertension: Secondary | ICD-10-CM | POA: Diagnosis not present

## 2016-06-01 DIAGNOSIS — Z66 Do not resuscitate: Secondary | ICD-10-CM | POA: Diagnosis not present

## 2016-06-01 DIAGNOSIS — E46 Unspecified protein-calorie malnutrition: Secondary | ICD-10-CM | POA: Diagnosis not present

## 2016-06-01 DIAGNOSIS — I509 Heart failure, unspecified: Secondary | ICD-10-CM | POA: Diagnosis not present

## 2016-06-01 DIAGNOSIS — N185 Chronic kidney disease, stage 5: Secondary | ICD-10-CM | POA: Diagnosis not present

## 2016-06-01 DIAGNOSIS — R197 Diarrhea, unspecified: Secondary | ICD-10-CM | POA: Diagnosis not present

## 2016-06-01 DIAGNOSIS — R21 Rash and other nonspecific skin eruption: Secondary | ICD-10-CM | POA: Diagnosis not present

## 2016-06-01 DIAGNOSIS — C774 Secondary and unspecified malignant neoplasm of inguinal and lower limb lymph nodes: Secondary | ICD-10-CM | POA: Diagnosis not present

## 2016-06-01 DIAGNOSIS — C519 Malignant neoplasm of vulva, unspecified: Secondary | ICD-10-CM | POA: Diagnosis not present

## 2016-06-01 MED ORDER — BETAMETHASONE DIPROPIONATE 0.05 % EX OINT: TOPICAL_OINTMENT | Freq: Two times a day (BID) | CUTANEOUS | 5 refills | Status: DC

## 2016-06-01 MED ORDER — LOPERAMIDE HCL 2 MG PO TABS: ORAL_TABLET | ORAL | 2 refills | Status: DC

## 2016-06-01 NOTE — Patient Instructions (Signed)
We will try the imodium I have sent in the ointment for your rash Great to see you!

## 2016-06-01 NOTE — Telephone Encounter (Signed)
This was handled in another phone note. Katharina Caper, April D, [place]

## 2016-06-02 NOTE — Telephone Encounter (Signed)
Vickie would like to know if there was any change in the plan of care. She also wants to know if the stool sample is still needed.  Please advise.  Please also state if pt is on the imodium

## 2016-06-03 NOTE — Addendum Note (Signed)
Addended byDorcas Mcmurray L on: 06/03/2016 08:41 AM   Modules accepted: Orders

## 2016-06-03 NOTE — Telephone Encounter (Signed)
LVM for Jocelyn Lamer to call. If she calls, please give her the information from Dr. Nori Riis. See note below. Ottis Stain, CMA

## 2016-06-03 NOTE — Telephone Encounter (Signed)
Spoke to [place], message given. Ottis Stain, CMA

## 2016-06-03 NOTE — Progress Notes (Signed)
    CHIEF COMPLAINT / HPI:   #1 diarrhea: Follow-up. She's now down to 3 semi-solid stools a day. No pain. No blood in her stool. Not a lot of mucus. Her appetite is not quite what it used to be but her weight seems stable. She is back at home from the nursing home and feels well. She is here today with her daughter. She has completed the vancomycin oral. #2. The want to fill out a form for not getting resuscitation if he [facility] called. They had one previously but it has run out. #3. She has some itchy skin rash on her arm's and upper back. It has been on and off for several weeks. Doesn't really affect her legs or her face.   REVIEW OF SYSTEMS:  Denies fever. See history of present illness above.  OBJECTIVE:  Vital signs are reviewed.   Vital signs reviewed. GENERAL: Well-developed, well-nourished, no acute distress. CARDIOVASCULAR: Regular rate and rhythm  LUNGS: Clear to auscultation bilaterally, normal wrist or effort ABDOMEN: Soft positive bowel sounds NEURO: No gross focal neurological deficits. She is a little wobbly when she stands initially. She walks holding onto her daughter's arm but appears like she could probably walk without assistance, she's just a little unsteady. SKIN: She has some erythematous papules that are small 1-2 mm on the left upper arm left upper back. There are some excoriations here. No blisters, no vesicles. It does not follow a dermatomal pattern. The right arm only has a couple of lesions in the right upper back has none. There 9 on the abdomen or chest or legs neck or face.    ASSESSMENT / PLAN: 1. Diarrhea: Much improved. I do not think we are dealing with Clostridium difficile infection at this point. I think she has many reasons to have chronic diarrhea, most recently would be her pelvic irradiation and multiple hospitalizations. Unless her diarrhea increases in number of stools beyond 3 per day or becomes more watery or she has pain or blood in the stool,  I do not think we need to do a stool sample. I will try her on some low-dose Imodium. I will give her a 2 mg tablet once a day. If they can, they can cut that in half for a 1 mg tablet once a day. She had her daughter will work with the dose with maximum dose 2 mg twice a day. They'll let me know if she's having any new or worsening symptoms.   #2. She had concerns about her appetite. Her weight is actually pretty stable considering all that she's been through recently. I told her not to worry but to eat whatever she wanted. #3. Skin rash: The sixth like some eczema or mild atopic dermatitis. I'll try her on some topical steroid cream. If that doesn't work to let me know and we can try something different. I would expect symptoms to improve or resolve in next 2 weeks. They know not to use that particular steroid cream on the face #4. DO NOT RESUSCITATE form: We filled out paperwork for the DO NOT RESUSCITATE form for home use. Went over in detail with her and her daughter and they are in agreement. #5. Will check CMP and CBC given hx anemia, recent diarrhea.  I would recommend seeing her back in 2-3 months unless something comes up in the interim. Her daughter has my pager number if she needs something urgently.

## 2016-06-03 NOTE — Telephone Encounter (Signed)
Dear [name] Team Please tell Meredith Bender that NO I do not think we need a stool sample at this time. The only change was the addition of some loperamide for which I sent a rx in and she ahd her daughter know how to take it. Tell her THANKS for her care! S

## 2016-06-06 ENCOUNTER — Telehealth: Payer: Self-pay | Admitting: Family Medicine

## 2016-06-06 DIAGNOSIS — I10 Essential (primary) hypertension: Secondary | ICD-10-CM | POA: Diagnosis not present

## 2016-06-06 DIAGNOSIS — C519 Malignant neoplasm of vulva, unspecified: Secondary | ICD-10-CM | POA: Diagnosis not present

## 2016-06-06 DIAGNOSIS — E46 Unspecified protein-calorie malnutrition: Secondary | ICD-10-CM | POA: Diagnosis not present

## 2016-06-06 DIAGNOSIS — I509 Heart failure, unspecified: Secondary | ICD-10-CM | POA: Diagnosis not present

## 2016-06-06 DIAGNOSIS — N185 Chronic kidney disease, stage 5: Secondary | ICD-10-CM | POA: Diagnosis not present

## 2016-06-06 DIAGNOSIS — C774 Secondary and unspecified malignant neoplasm of inguinal and lower limb lymph nodes: Secondary | ICD-10-CM | POA: Diagnosis not present

## 2016-06-06 NOTE — Telephone Encounter (Signed)
Vickie from Hospice called and wanted to know if the patient is still suppose to be on lasix? The patient has no refills and wasn't sure if this needs to be continued. If so what should the dosage be and how often. The patient is also requesting to be off the oxygen. Her O2 stats are very good 97+. So if this okay please send in a order to discontinue the oxygen. If you have any questions please call Vickie at [phone] and if it is after 6 pm call [phone]. Blima Rich

## 2016-06-07 DIAGNOSIS — I10 Essential (primary) hypertension: Secondary | ICD-10-CM | POA: Diagnosis not present

## 2016-06-07 DIAGNOSIS — C519 Malignant neoplasm of vulva, unspecified: Secondary | ICD-10-CM | POA: Diagnosis not present

## 2016-06-07 DIAGNOSIS — N185 Chronic kidney disease, stage 5: Secondary | ICD-10-CM | POA: Diagnosis not present

## 2016-06-07 DIAGNOSIS — I509 Heart failure, unspecified: Secondary | ICD-10-CM | POA: Diagnosis not present

## 2016-06-07 DIAGNOSIS — E46 Unspecified protein-calorie malnutrition: Secondary | ICD-10-CM | POA: Diagnosis not present

## 2016-06-07 DIAGNOSIS — C774 Secondary and unspecified malignant neoplasm of inguinal and lower limb lymph nodes: Secondary | ICD-10-CM | POA: Diagnosis not present

## 2016-06-07 MED ORDER — FUROSEMIDE 40 MG PO TABS: 40.0000 mg | ORAL_TABLET | Freq: Every day | ORAL | 3 refills | Status: DC

## 2016-06-07 NOTE — Telephone Encounter (Signed)
Dear [name] Team Can you call her [facility] nurse Loletha Carrow [phone] and tell her:  She IS on lasix once daily  I thought we had agreed that she could use the oxygen PRN---so she keeps it there but is not required to use uit unless she gets SOB. Her sats may intermittently be low so I would recommend keeping it available.  I will send in refills of the lasix THANKS! [name]

## 2016-06-07 NOTE — Telephone Encounter (Signed)
Left information for Meredith Bender. She will call if she has questions. Ottis Stain, CMA

## 2016-06-13 ENCOUNTER — Telehealth: Payer: Self-pay | Admitting: Family Medicine

## 2016-06-13 DIAGNOSIS — C519 Malignant neoplasm of vulva, unspecified: Secondary | ICD-10-CM | POA: Diagnosis not present

## 2016-06-13 DIAGNOSIS — I10 Essential (primary) hypertension: Secondary | ICD-10-CM | POA: Diagnosis not present

## 2016-06-13 DIAGNOSIS — C774 Secondary and unspecified malignant neoplasm of inguinal and lower limb lymph nodes: Secondary | ICD-10-CM | POA: Diagnosis not present

## 2016-06-13 DIAGNOSIS — E46 Unspecified protein-calorie malnutrition: Secondary | ICD-10-CM | POA: Diagnosis not present

## 2016-06-13 DIAGNOSIS — N185 Chronic kidney disease, stage 5: Secondary | ICD-10-CM | POA: Diagnosis not present

## 2016-06-13 DIAGNOSIS — I509 Heart failure, unspecified: Secondary | ICD-10-CM | POA: Diagnosis not present

## 2016-06-13 NOTE — Telephone Encounter (Signed)
Pt has quit using medication for rash on her back, the medicine made it worse. Pt would like to go a couple days without it. Vicky states rash is looking better. Pt has not been taking lasix, pt doesn't understand why she is needs to take it. Pt states she is not having any symptoms in which she would need to take it. Vicky would like to know if we can take it off med list. BP today was 168/78, resp 25 (no shortness of breath), pulse 80, and pulse oxygen was 99. Please advise. Thanks! ep

## 2016-06-15 NOTE — Telephone Encounter (Signed)
Left detailed voice message, with instructions for Vicky. Ottis Stain, CMA

## 2016-06-15 NOTE — Telephone Encounter (Signed)
Dear [name] Team Please tell her: 1. OK to stop the cream for the back for a few days---if still there in 5 days let me know and I will call in something different. 2.Ok to d/c lasix BP  And respirations and pulsox sound excellent. 3 I would leave the oxygen at home for PRN use. THANKS! [name]

## 2016-06-16 ENCOUNTER — Other Ambulatory Visit: Payer: Self-pay | Admitting: Family Medicine

## 2016-06-16 ENCOUNTER — Telehealth: Payer: Self-pay | Admitting: Family Medicine

## 2016-06-16 DIAGNOSIS — C519 Malignant neoplasm of vulva, unspecified: Secondary | ICD-10-CM | POA: Diagnosis not present

## 2016-06-16 DIAGNOSIS — I509 Heart failure, unspecified: Secondary | ICD-10-CM | POA: Diagnosis not present

## 2016-06-16 DIAGNOSIS — E46 Unspecified protein-calorie malnutrition: Secondary | ICD-10-CM | POA: Diagnosis not present

## 2016-06-16 DIAGNOSIS — I10 Essential (primary) hypertension: Secondary | ICD-10-CM | POA: Diagnosis not present

## 2016-06-16 DIAGNOSIS — N185 Chronic kidney disease, stage 5: Secondary | ICD-10-CM | POA: Diagnosis not present

## 2016-06-16 DIAGNOSIS — C774 Secondary and unspecified malignant neoplasm of inguinal and lower limb lymph nodes: Secondary | ICD-10-CM | POA: Diagnosis not present

## 2016-06-16 NOTE — Telephone Encounter (Signed)
2nd request. Pt would like to be called after medication has been called in. Please advise. Thanks! ep

## 2016-06-16 NOTE — Telephone Encounter (Signed)
Pt needs a refill on her Tramadol sent in. jw

## 2016-06-17 DIAGNOSIS — J449 Chronic obstructive pulmonary disease, unspecified: Secondary | ICD-10-CM | POA: Diagnosis not present

## 2016-06-17 DIAGNOSIS — N185 Chronic kidney disease, stage 5: Secondary | ICD-10-CM | POA: Diagnosis not present

## 2016-06-17 DIAGNOSIS — I10 Essential (primary) hypertension: Secondary | ICD-10-CM | POA: Diagnosis not present

## 2016-06-17 DIAGNOSIS — C519 Malignant neoplasm of vulva, unspecified: Secondary | ICD-10-CM | POA: Diagnosis not present

## 2016-06-17 DIAGNOSIS — C774 Secondary and unspecified malignant neoplasm of inguinal and lower limb lymph nodes: Secondary | ICD-10-CM | POA: Diagnosis not present

## 2016-06-17 DIAGNOSIS — E785 Hyperlipidemia, unspecified: Secondary | ICD-10-CM | POA: Diagnosis not present

## 2016-06-17 DIAGNOSIS — K219 Gastro-esophageal reflux disease without esophagitis: Secondary | ICD-10-CM | POA: Diagnosis not present

## 2016-06-17 DIAGNOSIS — I509 Heart failure, unspecified: Secondary | ICD-10-CM | POA: Diagnosis not present

## 2016-06-17 DIAGNOSIS — E46 Unspecified protein-calorie malnutrition: Secondary | ICD-10-CM | POA: Diagnosis not present

## 2016-06-17 MED ORDER — TRAMADOL HCL 50 MG PO TABS: ORAL_TABLET | ORAL | 5 refills | Status: DC

## 2016-06-17 NOTE — Telephone Encounter (Signed)
Tramadol 50 mg :1 tablet PO every 8 hours for pain, #90, 5 refills by Dr. Nori Riis called to [place].  Derl Barrow, RN

## 2016-06-17 NOTE — Telephone Encounter (Signed)
RN Team Can you call this in please [facility]! [name]

## 2016-06-18 DIAGNOSIS — N185 Chronic kidney disease, stage 5: Secondary | ICD-10-CM | POA: Diagnosis not present

## 2016-06-18 DIAGNOSIS — C519 Malignant neoplasm of vulva, unspecified: Secondary | ICD-10-CM | POA: Diagnosis not present

## 2016-06-18 DIAGNOSIS — I10 Essential (primary) hypertension: Secondary | ICD-10-CM | POA: Diagnosis not present

## 2016-06-18 DIAGNOSIS — C774 Secondary and unspecified malignant neoplasm of inguinal and lower limb lymph nodes: Secondary | ICD-10-CM | POA: Diagnosis not present

## 2016-06-18 DIAGNOSIS — E46 Unspecified protein-calorie malnutrition: Secondary | ICD-10-CM | POA: Diagnosis not present

## 2016-06-18 DIAGNOSIS — I509 Heart failure, unspecified: Secondary | ICD-10-CM | POA: Diagnosis not present

## 2016-06-19 ENCOUNTER — Inpatient Hospital Stay (HOSPITAL_COMMUNITY)
Admission: EM | Admit: 2016-06-19 | Discharge: 2016-06-21 | DRG: 305 | Disposition: A | Discharge status: Hospice | Attending: Family Medicine | Admitting: Family Medicine

## 2016-06-19 ENCOUNTER — Encounter (HOSPITAL_COMMUNITY): Payer: Self-pay | Admitting: Emergency Medicine

## 2016-06-19 ENCOUNTER — Emergency Department (HOSPITAL_COMMUNITY)

## 2016-06-19 DIAGNOSIS — L259 Unspecified contact dermatitis, unspecified cause: Secondary | ICD-10-CM | POA: Diagnosis present

## 2016-06-19 DIAGNOSIS — Z885 Allergy status to narcotic agent status: Secondary | ICD-10-CM

## 2016-06-19 DIAGNOSIS — N184 Chronic kidney disease, stage 4 (severe): Secondary | ICD-10-CM | POA: Diagnosis present

## 2016-06-19 DIAGNOSIS — C519 Malignant neoplasm of vulva, unspecified: Secondary | ICD-10-CM | POA: Diagnosis present

## 2016-06-19 DIAGNOSIS — G9340 Encephalopathy, unspecified: Secondary | ICD-10-CM | POA: Diagnosis not present

## 2016-06-19 DIAGNOSIS — Z681 Body mass index (BMI) 19 or less, adult: Secondary | ICD-10-CM

## 2016-06-19 DIAGNOSIS — T7840XA Allergy, unspecified, initial encounter: Secondary | ICD-10-CM | POA: Diagnosis not present

## 2016-06-19 DIAGNOSIS — N185 Chronic kidney disease, stage 5: Secondary | ICD-10-CM | POA: Diagnosis not present

## 2016-06-19 DIAGNOSIS — Z8541 Personal history of malignant neoplasm of cervix uteri: Secondary | ICD-10-CM

## 2016-06-19 DIAGNOSIS — C779 Secondary and unspecified malignant neoplasm of lymph node, unspecified: Secondary | ICD-10-CM | POA: Diagnosis present

## 2016-06-19 DIAGNOSIS — E46 Unspecified protein-calorie malnutrition: Secondary | ICD-10-CM | POA: Diagnosis not present

## 2016-06-19 DIAGNOSIS — Z9221 Personal history of antineoplastic chemotherapy: Secondary | ICD-10-CM

## 2016-06-19 DIAGNOSIS — L209 Atopic dermatitis, unspecified: Secondary | ICD-10-CM | POA: Diagnosis present

## 2016-06-19 DIAGNOSIS — Z90710 Acquired absence of both cervix and uterus: Secondary | ICD-10-CM

## 2016-06-19 DIAGNOSIS — E785 Hyperlipidemia, unspecified: Secondary | ICD-10-CM | POA: Diagnosis present

## 2016-06-19 DIAGNOSIS — Z79891 Long term (current) use of opiate analgesic: Secondary | ICD-10-CM

## 2016-06-19 DIAGNOSIS — I161 Hypertensive emergency: Principal | ICD-10-CM | POA: Diagnosis present

## 2016-06-19 DIAGNOSIS — Z8049 Family history of malignant neoplasm of other genital organs: Secondary | ICD-10-CM

## 2016-06-19 DIAGNOSIS — I130 Hypertensive heart and chronic kidney disease with heart failure and stage 1 through stage 4 chronic kidney disease, or unspecified chronic kidney disease: Secondary | ICD-10-CM | POA: Diagnosis present

## 2016-06-19 DIAGNOSIS — Z880 Allergy status to penicillin: Secondary | ICD-10-CM

## 2016-06-19 DIAGNOSIS — J449 Chronic obstructive pulmonary disease, unspecified: Secondary | ICD-10-CM | POA: Diagnosis present

## 2016-06-19 DIAGNOSIS — I252 Old myocardial infarction: Secondary | ICD-10-CM

## 2016-06-19 DIAGNOSIS — C774 Secondary and unspecified malignant neoplasm of inguinal and lower limb lymph nodes: Secondary | ICD-10-CM | POA: Diagnosis not present

## 2016-06-19 DIAGNOSIS — R3915 Urgency of urination: Secondary | ICD-10-CM | POA: Diagnosis not present

## 2016-06-19 DIAGNOSIS — R4182 Altered mental status, unspecified: Secondary | ICD-10-CM | POA: Diagnosis present

## 2016-06-19 DIAGNOSIS — I674 Hypertensive encephalopathy: Secondary | ICD-10-CM | POA: Diagnosis present

## 2016-06-19 DIAGNOSIS — Z923 Personal history of irradiation: Secondary | ICD-10-CM

## 2016-06-19 DIAGNOSIS — Z8673 Personal history of transient ischemic attack (TIA), and cerebral infarction without residual deficits: Secondary | ICD-10-CM

## 2016-06-19 DIAGNOSIS — K219 Gastro-esophageal reflux disease without esophagitis: Secondary | ICD-10-CM | POA: Diagnosis present

## 2016-06-19 DIAGNOSIS — R350 Frequency of micturition: Secondary | ICD-10-CM

## 2016-06-19 DIAGNOSIS — I5032 Chronic diastolic (congestive) heart failure: Secondary | ICD-10-CM | POA: Diagnosis present

## 2016-06-19 DIAGNOSIS — I10 Essential (primary) hypertension: Secondary | ICD-10-CM | POA: Diagnosis not present

## 2016-06-19 DIAGNOSIS — Z66 Do not resuscitate: Secondary | ICD-10-CM | POA: Diagnosis present

## 2016-06-19 DIAGNOSIS — Z515 Encounter for palliative care: Secondary | ICD-10-CM | POA: Diagnosis present

## 2016-06-19 DIAGNOSIS — Z87891 Personal history of nicotine dependence: Secondary | ICD-10-CM

## 2016-06-19 DIAGNOSIS — I509 Heart failure, unspecified: Secondary | ICD-10-CM | POA: Diagnosis not present

## 2016-06-19 DIAGNOSIS — L299 Pruritus, unspecified: Secondary | ICD-10-CM | POA: Diagnosis not present

## 2016-06-19 DIAGNOSIS — Z8544 Personal history of malignant neoplasm of other female genital organs: Secondary | ICD-10-CM

## 2016-06-19 DIAGNOSIS — E663 Overweight: Secondary | ICD-10-CM | POA: Diagnosis present

## 2016-06-19 DIAGNOSIS — I129 Hypertensive chronic kidney disease with stage 1 through stage 4 chronic kidney disease, or unspecified chronic kidney disease: Secondary | ICD-10-CM | POA: Diagnosis not present

## 2016-06-19 LAB — DIFFERENTIAL
Basophils Absolute: 0 10*3/uL (ref 0.0–0.1)
Basophils Relative: 0 %
Eosinophils Absolute: 1.3 10*3/uL — ABNORMAL HIGH (ref 0.0–0.7)
Eosinophils Relative: 14 %
LYMPHS PCT: 12 %
Lymphs Abs: 1.1 10*3/uL (ref 0.7–4.0)
MONO ABS: 0.5 10*3/uL (ref 0.1–1.0)
MONOS PCT: 6 %
NEUTROS ABS: 6.1 10*3/uL (ref 1.7–7.7)
Neutrophils Relative %: 68 %

## 2016-06-19 LAB — COMPREHENSIVE METABOLIC PANEL
ALK PHOS: 89 U/L (ref 38–126)
ALT: 15 U/L (ref 14–54)
AST: 21 U/L (ref 15–41)
Albumin: 3.6 g/dL (ref 3.5–5.0)
Anion gap: 10 (ref 5–15)
BUN: 35 mg/dL — ABNORMAL HIGH (ref 6–20)
CALCIUM: 9.1 mg/dL (ref 8.9–10.3)
CO2: 21 mmol/L — AB (ref 22–32)
CREATININE: 2.44 mg/dL — AB (ref 0.44–1.00)
Chloride: 110 mmol/L (ref 101–111)
GFR, EST AFRICAN AMERICAN: 19 mL/min — AB (ref >60)
GFR, EST NON AFRICAN AMERICAN: 17 mL/min — AB (ref >60)
Glucose, Bld: 111 mg/dL — ABNORMAL HIGH (ref 65–99)
Potassium: 4.4 mmol/L (ref 3.5–5.1)
SODIUM: 141 mmol/L (ref 135–145)
Total Bilirubin: 0.7 mg/dL (ref 0.3–1.2)
Total Protein: 6.9 g/dL (ref 6.5–8.1)

## 2016-06-19 LAB — LIPASE, BLOOD: LIPASE: 51 U/L (ref 11–51)

## 2016-06-19 LAB — CBC
HCT: 36.9 % (ref 36.0–46.0)
HEMATOCRIT: 40.3 % (ref 36.0–46.0)
HEMOGLOBIN: 12.9 g/dL (ref 12.0–15.0)
HEMOGLOBIN: 11.8 g/dL — AB (ref 12.0–15.0)
MCH: 28.1 pg (ref 26.0–34.0)
MCH: 28.1 pg (ref 26.0–34.0)
MCHC: 32 g/dL (ref 30.0–36.0)
MCHC: 32 g/dL (ref 30.0–36.0)
MCV: 87.8 fL (ref 78.0–100.0)
MCV: 87.9 fL (ref 78.0–100.0)
Platelets: 273 10*3/uL (ref 150–400)
Platelets: 250 10*3/uL (ref 150–400)
RBC: 4.59 MIL/uL (ref 3.87–5.11)
RBC: 4.2 MIL/uL (ref 3.87–5.11)
RDW: 13.7 % (ref 11.5–15.5)
RDW: 13.7 % (ref 11.5–15.5)
WBC: 9.1 10*3/uL (ref 4.0–10.5)
WBC: 6.7 10*3/uL (ref 4.0–10.5)

## 2016-06-19 LAB — URINALYSIS, ROUTINE W REFLEX MICROSCOPIC
BILIRUBIN URINE: NEGATIVE
Glucose, UA: NEGATIVE mg/dL
KETONES UR: NEGATIVE mg/dL
Nitrite: NEGATIVE
Specific Gravity, Urine: 1.01 (ref 1.005–1.030)
pH: 7 (ref 5.0–8.0)

## 2016-06-19 LAB — MRSA PCR SCREENING: MRSA BY PCR: POSITIVE — AB

## 2016-06-19 LAB — PROTIME-INR
INR: 0.96
Prothrombin Time: 12.8 seconds (ref 11.4–15.2)

## 2016-06-19 LAB — CREATININE, SERUM
CREATININE: 2.33 mg/dL — AB (ref 0.44–1.00)
GFR, EST AFRICAN AMERICAN: 20 mL/min — AB (ref >60)
GFR, EST NON AFRICAN AMERICAN: 18 mL/min — AB (ref >60)

## 2016-06-19 LAB — URINE MICROSCOPIC-ADD ON

## 2016-06-19 LAB — TROPONIN I
TROPONIN I: 0.03 ng/mL — AB (ref <0.03)
Troponin I: 0.04 ng/mL (ref <0.03)
Troponin I: 0.03 ng/mL (ref <0.03)

## 2016-06-19 LAB — I-STAT CG4 LACTIC ACID, ED
LACTIC ACID, VENOUS: 1.01 mmol/L (ref 0.5–1.9)
Lactic Acid, Venous: 0.37 mmol/L — ABNORMAL LOW (ref 0.5–1.9)

## 2016-06-19 LAB — APTT: aPTT: 29 seconds (ref 24–36)

## 2016-06-19 LAB — I-STAT TROPONIN, ED: Troponin i, poc: 0.15 ng/mL (ref 0.00–0.08)

## 2016-06-19 LAB — I-STAT CHEM 8, ED
BUN: 36 mg/dL — ABNORMAL HIGH (ref 6–20)
CALCIUM ION: 1.17 mmol/L (ref 1.15–1.40)
CHLORIDE: 112 mmol/L — AB (ref 101–111)
Creatinine, Ser: 2.5 mg/dL — ABNORMAL HIGH (ref 0.44–1.00)
Glucose, Bld: 107 mg/dL — ABNORMAL HIGH (ref 65–99)
HCT: 39 % (ref 36.0–46.0)
HEMOGLOBIN: 13.3 g/dL (ref 12.0–15.0)
Potassium: 4.4 mmol/L (ref 3.5–5.1)
SODIUM: 143 mmol/L (ref 135–145)
TCO2: 22 mmol/L (ref 0–100)

## 2016-06-19 LAB — CBG MONITORING, ED: Glucose-Capillary: 110 mg/dL — ABNORMAL HIGH (ref 65–99)

## 2016-06-19 MED ORDER — PANTOPRAZOLE SODIUM 40 MG PO TBEC
40.0000 mg | DELAYED_RELEASE_TABLET | Freq: Every day | ORAL | Status: DC
  Administered 2016-06-19 – 2016-06-21 (×3): 40 mg via ORAL
  Filled 2016-06-19 (×3): qty 1

## 2016-06-19 MED ORDER — LORATADINE 10 MG PO TABS
10.0000 mg | ORAL_TABLET | Freq: Every day | ORAL | Status: DC
  Administered 2016-06-19 – 2016-06-21 (×3): 10 mg via ORAL
  Filled 2016-06-19 (×3): qty 1

## 2016-06-19 MED ORDER — LABETALOL HCL 5 MG/ML IV SOLN
10.0000 mg | Freq: Once | INTRAVENOUS | Status: AC
  Administered 2016-06-19: 10 mg via INTRAVENOUS
  Filled 2016-06-19: qty 4

## 2016-06-19 MED ORDER — ENOXAPARIN SODIUM 30 MG/0.3ML ~~LOC~~ SOLN
30.0000 mg | SUBCUTANEOUS | Status: DC
  Administered 2016-06-19 – 2016-06-21 (×3): 30 mg via SUBCUTANEOUS
  Filled 2016-06-19 (×3): qty 0.3

## 2016-06-19 MED ORDER — ONDANSETRON HCL 4 MG/2ML IJ SOLN
4.0000 mg | Freq: Once | INTRAMUSCULAR | Status: AC | PRN
  Administered 2016-06-19: 4 mg via INTRAVENOUS
  Filled 2016-06-19: qty 2

## 2016-06-19 MED ORDER — HYDRALAZINE HCL 25 MG PO TABS
25.0000 mg | ORAL_TABLET | Freq: Three times a day (TID) | ORAL | Status: DC
  Administered 2016-06-19 – 2016-06-21 (×7): 25 mg via ORAL
  Filled 2016-06-19 (×7): qty 1

## 2016-06-19 MED ORDER — TRAMADOL HCL 50 MG PO TABS
100.0000 mg | ORAL_TABLET | Freq: Two times a day (BID) | ORAL | Status: DC | PRN
  Administered 2016-06-20: 100 mg via ORAL
  Filled 2016-06-19: qty 2

## 2016-06-19 MED ORDER — HYDRALAZINE HCL 20 MG/ML IJ SOLN: 5.0000 mg | Freq: Four times a day (QID) | INTRAMUSCULAR | Status: DC | PRN

## 2016-06-19 MED ORDER — VITAMIN D 1000 UNITS PO TABS
3000.0000 [IU] | ORAL_TABLET | Freq: Every day | ORAL | Status: DC
  Administered 2016-06-19 – 2016-06-20 (×2): 3000 [IU] via ORAL
  Filled 2016-06-19 (×2): qty 3

## 2016-06-19 MED ORDER — LABETALOL HCL 5 MG/ML IV SOLN
5.0000 mg | INTRAVENOUS | Status: DC | PRN
Start: 2016-06-19 — End: 2016-06-21
  Administered 2016-06-20 (×2): 5 mg via INTRAVENOUS
  Filled 2016-06-19 (×2): qty 4

## 2016-06-19 MED ORDER — VERAPAMIL HCL ER 240 MG PO TBCR
240.0000 mg | EXTENDED_RELEASE_TABLET | Freq: Every day | ORAL | Status: DC
  Administered 2016-06-19 – 2016-06-21 (×3): 240 mg via ORAL
  Filled 2016-06-19 (×2): qty 2
  Filled 2016-06-19 (×2): qty 1

## 2016-06-19 NOTE — H&P (Signed)
[facility] Admission History and Physical Service Pager: [phone]  Patient name: Meredith Bender [place] Medical record number: [id number] Date of birth: 06/26/1926 Age: 82 y.o. Gender: female  Primary Care Provider: [name], MD Consultants: None Code Status: DNR/DNI  Chief Complaint: AMS  Assessment and Plan: [name] is a 82 y.o. female presenting with AMS . PMH is significant for vulvar and cervical cancer, HTN, HLD, CVA, MI, GERD, COPD, CKD.  #Hypertensive Emergency:  History of chronic, poorly controlled   hypertension with CKD stage IV. PCP allows for the permissive range of 160-165 as ideal I patient with multiple strokes.  Highest documented blood pressure was 249/110.  Patient denied any headache, vision changes, chest pain, shortness of breath upon admission, but did note some vision changes, tongue swelling and concern for stroke last night.  Takes verapamil 240mg  daily, hydralazine 25mg  TID.  Was given labetalol 10mg  IV and then started on home medication in the ED.  ISTAT troponin about at baseline.  CT head and CXR negative. No FND. EKG showed NSR. Emergency more than urgency in the setting of AMS on admission with improvement after BP control.  - admit to FMTS in SDU under attending Mingo Amber - trend troponins q6hrs - continue home medications - labetolol 5mg  IV PRN SBP >200, DBP >110 - continuous pulse ox - vital signs per floor procotol  #AMS: Reasonable to think that her blood pressure could explain her AMS (hypertensive encephalopathy). CT head showed no acute pathology. No FND on exam, unlikely to be CVA. Could be related to medication. Received dose of valtrex for suspected shingles last night. This morning was concerned she was having a stroke and called EMS. Discussed with pharmacy and valtrex does not commonly cause CNS side effects and certainly not acutely (in package insert and on Uptodate Valtrex number one adverse side effect is CNS  (including agitation, hallucinations, confusion, delirium, seizures, and encephalopathy). Cardiac workup thus far is normal, as noted above (negative).  UA showed no signs of infection.  Patient is afebrile and without leukocytosis. Lactic acid wnl. CXR normal and patient has had no recent illnesses.  No electrolyte abnormalities. Creatinine is elevated, but within her normal limits for having CKD-IV.   - stop valtrex - f/u blood cultures collected in ED - neuro checks q2hrs x12hrs - AM CBC/BMP   #Rash: Patchy erythematous rash with papules for ~one month on bilateral upper shoulders and flanks. Well demarcated and pruritic with multiple excoriations. Non-vesicular. Rash on arms have resolved, but multiple excoriations present.  No changes in detergents or new clothes.  No vesicles present.  Failed kenalog 1% in outpatient.  Likely contact dermatitis or atopic dermatitis. No concern for shingles.  - hold on creams for now - consider adding steroid cream back   #HFpEF:  Admitted in mid October for SOB 2/2 heart failure exacerbation.  Last echo was 10/17 and showed EF 55-60 and G1DD. Denies SOB, satting well on room air, lungs clear and not fluid overloaded.  Lasix DC'd by primary care doctor several days ago - continue to monitor - SLIV  #CKD stage IV. At baseline. -Daily BMET -Avoid nephrotoxic agents  #Hx vulvovaginal cancer.  S/p chemoradiation.  With recent diagnosis of cancer in her lymph nodes. Follows with oncology. Now a DNR/DNI with hospice/palliative care services at home. Family and patient still want medical management of her conditions. -continue home Tramadol 50 mg Q8 as needed for pain  FEN/GI: NPO pending RN bedside swallow eval  Prophylaxis: lovenox  Disposition: admit to FMTS for observation  History of Present Illness:  Meredith Bender is a 82 y.o. female with a history of metastatic vulvovaginal cancer currently on hospice/palliative care who presents with altered mental  status and found to be in hypertensive emergency.  Patient lives at home and has home health services to assist with some ADLs like cooking.  Patient reportedly called EMS for concern that she was having a stroke.  She noted some confusion of where she was, tongue swelling and vision changes last night where she was reported seeing different colors.  Hospice/palliative care providers prescribed Valtrex for suspected shingles, and patient attributes her acute change in mental state to taking this medicine.  She denies any chest pain, shortness of breath, nausea, vomiting or diarrhea.  In the ED she had CXR, EKG, CT head and UA, which were all WNL. Labs also revealed no electrolyte abnormalities.   Review Of Systems: Per HPI   ROS  Patient Active Problem List   Diagnosis Date Noted  . Hypertensive emergency 06/19/2016  . Metastatic cancer ([place])   . Shortness of breath   . Heart failure ([place]) 05/04/2016  . CHF (congestive heart failure) ([place]) 05/04/2016  . Abnormality of gait 03/25/2016  . CKD (chronic kidney disease) stage 4, GFR 15-29 ml/min (HCC) 03/14/2016  . Acute on chronic kidney failure ([place])   . Intracerebral hemorrhage 02/22/2016  . Cerebellar hemorrhage ([place])   . Olecranon fracture   . Intracranial hemorrhage ([place]) 02/20/2016  . Near syncope 02/20/2016  . 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 Dysuria 05/05/2015  . Primary vulvar cancer ([place]) 03/19/2015  . Urethral lesion 03/06/2015  . Vulvar mass 03/06/2015  . Left ovarian cyst 03/06/2015  . Postmenopausal vaginal bleeding 02/25/2015  . s/p hysterectomy for prolapsed uterus 02/25/2015  . Vaginal mass 02/25/2015  . History of repair for rectocele 02/25/2015  . Living accommodation issues 12/18/2014  . Knee pain, bilateral 01/09/2014  . Rotator cuff syndrome of both shoulders 01/09/2014  . Arthritis 12/02/2013  . DNR (do not resuscitate) discussion 12/02/2013  . Intertrochanteric fracture of right hip ([place]) 08/29/2013  . History of  UTI 06/12/2013  . Routine health maintenance 06/12/2013  . Renal insufficiency 02/28/2013  . Dyslipidemia 02/28/2013  . Benign hypertension with CKD (chronic kidney disease) stage IV [facility])     Past Medical History: Past Medical History:  Diagnosis Date  . Anginal pain ([place])   . Arthritis   . Blood transfusion without reported diagnosis   . Cataract   . Cervical cancer ([place])   . Chronic kidney disease   . COPD (chronic obstructive pulmonary disease) ([place])   . CVA (cerebral infarction)    Remote bilateral cerebellar infarcts on CT 8/14  . Dyslipidemia 02/28/2013  . GERD (gastroesophageal reflux disease)   . Hypertension   . Intertrochanteric fracture of right femur ([employer])   . Myocardial infarction   . Osteoporosis   . Radiation 04/02/15-05/29/15   vulvar region, inguinal lymph node 60 Gu  . Stroke (cerebrum) ([place]) 01/2016  . Stroke [facility]) 1978   no deficits  . Vulvar cancer [facility])     Past Surgical History: Past Surgical History:  Procedure Laterality Date  . ABDOMINAL HYSTERECTOMY    . APPENDECTOMY    . HERNIA REPAIR    . INTRAMEDULLARY (IM) NAIL INTERTROCHANTERIC Right 08/29/2013   Procedure: INTRAMEDULLARY (IM) NAIL INTERTROCHANTRIC;  Surgeon: Nita Sells, MD;  Location: [facility];  Service: Orthopedics;  Laterality: Right;  . TUBAL  LIGATION      Social History: Social History  Substance Use Topics  . Smoking status: Never Smoker  . Smokeless tobacco: Former Systems developer    Types: Snuff  . Alcohol use No   Please also refer to relevant sections of EMR.  Family History: Family History  Problem Relation Age of Onset  . Diabetes Mother   . Uterine cancer Sister     Allergies and Medications: Allergies  Allergen Reactions  . Codeine Nausea Only  . Penicillins Rash    Pt does not remember how long ago or severity of reaction.    No current facility-administered medications on file prior to encounter.    Current Outpatient Prescriptions on File Prior to  Encounter  Medication Sig Dispense Refill  . Cholecalciferol (VITAMIN D3) 3000 units TABS Take 3,000 Units by mouth daily.     . hydrALAZINE (APRESOLINE) 25 MG tablet Take 1 tablet (25 mg total) by mouth 3 (three) times daily.    Marland Kitchen loperamide (IMODIUM A-D) 2 MG tablet Take by mouth one once or twice daily as needed for diarrhea (Patient taking differently: Take 2 mg by mouth 2 (two) times daily as needed for diarrhea or loose stools. Take by mouth one once or twice daily as needed for diarrhea) 60 tablet 2  . loratadine (CLARITIN) 10 MG tablet Take 1 tablet (10 mg total) by mouth daily. 90 tablet 3  . omeprazole (PRILOSEC) 20 MG capsule Take 1 capsule (20 mg total) by mouth daily. 90 capsule 3  . ondansetron (ZOFRAN) 4 MG tablet Take 1 tablet (4 mg total) by mouth every 8 (eight) hours as needed for nausea or vomiting. 20 tablet 0  . saccharomyces boulardii (FLORASTOR) 250 MG capsule Take 250 mg by mouth daily.    . traMADol (ULTRAM) 50 MG tablet Take one tablet by mouth every eight hours for pain (Patient taking differently: Take 50 mg by mouth every 6 (six) hours as needed for moderate pain. Take one tablet by mouth every eight hours for pain) 90 tablet 5  . triamcinolone cream (KENALOG) 0.1 % Apply to itchy areas 2 or 3 times a day as needed 80 g 3  . verapamil (VERELAN PM) 240 MG 24 hr capsule TAKE 1 CAPSULE BY MOUTH DAILY 90 capsule 0  . vitamin E 400 UNIT capsule Take by mouth.      Objective: BP (!) 193/78 (BP Location: Right Arm)   Pulse 80   Temp 98.3 F (36.8 C) (Oral)   Resp 20   Ht 5\' 2"  (1.575 m)   Wt 99 lb 13.9 oz (45.3 kg)   SpO2 96%   BMI 18.27 kg/m  Exam: General: 82yo F lying down in bed appearing comfortable, NAD Eyes: Non-injected, no drainage, EOMI, PERRL Neck: supple, no LAD Cardiovascular: RRR, s1/s2 present, no murmurs rubs or gallops Respiratory: NWOB, CTABL, no wheezing or rhonci Gastrointestinal: soft, non-tender, non-distended, no organomegaly MSK: no  deformities, no edema Derm: erythematous patches on upper shoulders and flanks bilaterally with multiple excoriations, not following any dermatomal pattern.  No blisters or vesicles present.  Neuro: CNII-X WNL, no FND, strength 5/5 in upper and lower extremities bilaterally, A&O Psych: normal mood and affect  Labs and Imaging: Results for orders placed or performed during the hospital encounter of 06/19/16 (from the past 24 hour(s))  Protime-INR     Status: None   Collection Time: 06/19/16  6:25 AM  Result Value Ref Range   Prothrombin Time 12.8 11.4 - 15.2 seconds  INR 0.96   APTT     Status: None   Collection Time: 06/19/16  6:25 AM  Result Value Ref Range   aPTT 29 24 - 36 seconds  CBC     Status: None   Collection Time: 06/19/16  6:25 AM  Result Value Ref Range   WBC 9.1 4.0 - 10.5 K/uL   RBC 4.59 3.87 - 5.11 MIL/uL   Hemoglobin 12.9 12.0 - 15.0 g/dL   HCT 40.3 36.0 - 46.0 %   MCV 87.8 78.0 - 100.0 fL   MCH 28.1 26.0 - 34.0 pg   MCHC 32.0 30.0 - 36.0 g/dL   RDW 13.7 11.5 - 15.5 %   Platelets 273 150 - 400 K/uL  Differential     Status: Abnormal   Collection Time: 06/19/16  6:25 AM  Result Value Ref Range   Neutrophils Relative % 68 %   Neutro Abs 6.1 1.7 - 7.7 K/uL   Lymphocytes Relative 12 %   Lymphs Abs 1.1 0.7 - 4.0 K/uL   Monocytes Relative 6 %   Monocytes Absolute 0.5 0.1 - 1.0 K/uL   Eosinophils Relative 14 %   Eosinophils Absolute 1.3 (H) 0.0 - 0.7 K/uL   Basophils Relative 0 %   Basophils Absolute 0.0 0.0 - 0.1 K/uL  Comprehensive metabolic panel     Status: Abnormal   Collection Time: 06/19/16  6:25 AM  Result Value Ref Range   Sodium 141 135 - 145 mmol/L   Potassium 4.4 3.5 - 5.1 mmol/L   Chloride 110 101 - 111 mmol/L   CO2 21 (L) 22 - 32 mmol/L   Glucose, Bld 111 (H) 65 - 99 mg/dL   BUN 35 (H) 6 - 20 mg/dL   Creatinine, Ser 2.44 (H) 0.44 - 1.00 mg/dL   Calcium 9.1 8.9 - 10.3 mg/dL   Total Protein 6.9 6.5 - 8.1 g/dL   Albumin 3.6 3.5 - 5.0 g/dL    AST 21 15 - 41 U/L   ALT 15 14 - 54 U/L   Alkaline Phosphatase 89 38 - 126 U/L   Total Bilirubin 0.7 0.3 - 1.2 mg/dL   GFR calc non Af Amer 17 (L) >60 mL/min   GFR calc Af Amer 19 (L) >60 mL/min   Anion gap 10 5 - 15  Lipase, blood     Status: None   Collection Time: 06/19/16  6:25 AM  Result Value Ref Range   Lipase 51 11 - 51 U/L  Troponin I     Status: Abnormal   Collection Time: 06/19/16  6:25 AM  Result Value Ref Range   Troponin I 0.04 (HH) <0.03 ng/mL  I-stat troponin, ED     Status: Abnormal   Collection Time: 06/19/16  6:43 AM  Result Value Ref Range   Troponin i, poc 0.15 (HH) 0.00 - 0.08 ng/mL   Comment NOTIFIED PHYSICIAN    Comment 3          CBG monitoring, ED     Status: Abnormal   Collection Time: 06/19/16  6:44 AM  Result Value Ref Range   Glucose-Capillary 110 (H) 65 - 99 mg/dL  I-Stat Chem 8, ED     Status: Abnormal   Collection Time: 06/19/16  6:45 AM  Result Value Ref Range   Sodium 143 135 - 145 mmol/L   Potassium 4.4 3.5 - 5.1 mmol/L   Chloride 112 (H) 101 - 111 mmol/L   BUN 36 (H) 6 - 20 mg/dL  Creatinine, Ser 2.50 (H) 0.44 - 1.00 mg/dL   Glucose, Bld 107 (H) 65 - 99 mg/dL   Calcium, Ion 1.17 1.15 - 1.40 mmol/L   TCO2 22 0 - 100 mmol/L   Hemoglobin 13.3 12.0 - 15.0 g/dL   HCT 39.0 36.0 - 46.0 %  I-Stat CG4 Lactic Acid, ED  (not at  [facility])     Status: None   Collection Time: 06/19/16  6:55 AM  Result Value Ref Range   Lactic Acid, Venous 1.01 0.5 - 1.9 mmol/L  Urinalysis, Routine w reflex microscopic (not at [facility])     Status: Abnormal   Collection Time: 06/19/16  7:35 AM  Result Value Ref Range   Color, Urine YELLOW YELLOW   APPearance CLEAR CLEAR   Specific Gravity, Urine 1.010 1.005 - 1.030   pH 7.0 5.0 - 8.0   Glucose, UA NEGATIVE NEGATIVE mg/dL   Hgb urine dipstick TRACE (A) NEGATIVE   Bilirubin Urine NEGATIVE NEGATIVE   Ketones, ur NEGATIVE NEGATIVE mg/dL   Protein, ur >300 (A) NEGATIVE mg/dL   Nitrite NEGATIVE NEGATIVE   Leukocytes,  UA SMALL (A) NEGATIVE  Urine microscopic-add on     Status: Abnormal   Collection Time: 06/19/16  7:35 AM  Result Value Ref Range   Squamous Epithelial / LPF 0-5 (A) NONE SEEN   WBC, UA 6-30 0 - 5 WBC/hpf   RBC / HPF 0-5 0 - 5 RBC/hpf   Bacteria, UA RARE (A) NONE SEEN  I-Stat CG4 Lactic Acid, ED  (not at  [facility])     Status: Abnormal   Collection Time: 06/19/16  9:55 AM  Result Value Ref Range   Lactic Acid, Venous 0.37 (L) 0.5 - 1.9 mmol/L  CBC     Status: Abnormal   Collection Time: 06/19/16 11:58 AM  Result Value Ref Range   WBC 6.7 4.0 - 10.5 K/uL   RBC 4.20 3.87 - 5.11 MIL/uL   Hemoglobin 11.8 (L) 12.0 - 15.0 g/dL   HCT 36.9 36.0 - 46.0 %   MCV 87.9 78.0 - 100.0 fL   MCH 28.1 26.0 - 34.0 pg   MCHC 32.0 30.0 - 36.0 g/dL   RDW 13.7 11.5 - 15.5 %   Platelets 250 150 - 400 K/uL   Dg Chest 2 View  Result Date: 06/19/2016 CLINICAL DATA:  82 year old female with history of encephalopathy. Shingles. Burning and itching on head and face. EXAM: CHEST  2 VIEW COMPARISON:  Chest x-ray 05/04/2016. FINDINGS: Study is limited by patient rotation to the right which grossly distorts cardiomediastinal contours and other anatomy. With this limitation in mind there is no definite acute consolidative airspace disease and no pleural effusions. No evidence of pulmonary edema. Mild diffuse coarsening of the interstitial markings is similar to prior studies and appears to be chronic in this patient. Heart size is mildly enlarged. Aortic atherosclerosis. IMPRESSION: 1. Limited exam demonstrating no definite radiographic evidence of acute cardiopulmonary disease. 2. Aortic atherosclerosis. 3. Mild cardiomegaly. Electronically Signed   By: Vinnie Langton M.D.   On: 06/19/2016 08:46   Ct Head Wo Contrast  Addendum Date: 06/19/2016   ADDENDUM REPORT: 06/19/2016 06:57 ADDENDUM: Following initial interpretation by Dr. Radene Knee, the patient's status was changed to a code stroke. Areas of hypoattenuation of the deep  nuclei and cortex at the ganglionic and superior ganglionic regions are not significantly changed from 02/20/2016. ASPECTS score = 10 [place] Stroke Program Early CT Score Normal score = 10 PLEASE NOTE: This does not  represent a change in interpretation of the original report, only a quantification of the ASPECTS score. These results were called by telephone at the time of interpretation on 06/19/2016 at 6:56 am to EDP who verbally acknowledged these results. Electronically Signed   By: Staci Righter M.D.   On: 06/19/2016 06:57   Result Date: 06/19/2016 CLINICAL DATA:  Acute onset of burning sensation at the face. Altered mental status. Aphasia. Initial encounter. EXAM: CT HEAD WITHOUT CONTRAST TECHNIQUE: Contiguous axial images were obtained from the base of the skull through the vertex without intravenous contrast. COMPARISON:  CT of the head performed 02/20/2016, and MRI of the brain performed 02/21/2016 FINDINGS: Brain: No evidence of acute infarction, hemorrhage, hydrocephalus, extra-axial collection or mass lesion/mass effect. Prominence of the ventricles and sulci reflects mild cortical volume loss. Scattered periventricular and subcortical white matter change likely reflects small vessel ischemic microangiopathy. Mild cerebellar atrophy is noted. Chronic ischemic change is seen at the basal ganglia bilaterally, and a chronic lacunar infarct is noted at the right thalamus. The brainstem and fourth ventricle are within normal limits. The cerebral hemispheres demonstrate grossly normal gray-white differentiation. No mass effect or midline shift is seen. Vascular: No hyperdense vessel or unexpected calcification. Calcification is seen along the vertebral and internal carotid arteries bilaterally, and minimally along the basilar artery. Skull: There is no evidence of fracture; visualized osseous structures are unremarkable in appearance. Sinuses/Orbits: The visualized portions of the orbits are within normal  limits. The paranasal sinuses and mastoid air cells are well-aerated. Other: No significant soft tissue abnormalities are seen. IMPRESSION: 1. No acute intracranial pathology seen on CT. 2. Mild cortical volume loss and scattered small vessel ischemic microangiopathy. 3. Chronic ischemic change at the basal ganglia bilaterally, and chronic lacunar infarct at the right thalamus. Electronically Signed: By: Garald Balding M.D. On: 06/19/2016 06:37    Eloise Levels, MD 06/19/2016, 12:48 PM PGY-1, [place] Intern pager: [phone], text pages welcome  FPTS Upper-Level Resident Addendum  I have independently interviewed and examined the patient. I have discussed the above with the original author and agree with their documentation. My edits for correction/addition/clarification are in pink. Please see also any attending notes.   Katheren Shams, DO PGY-3, [place] Service pager: [phone] (text pages welcome through [facility])

## 2016-06-19 NOTE — ED Provider Notes (Signed)
[facility] DEPT Provider Note   CSN: [id number] Arrival date & time: 06/19/16  [id number]     History   Chief Complaint Chief Complaint  Patient presents with  . Rash    Shingles  . Altered Mental Status    HPI Meredith Bender is a 82 y.o. female on hospice/ palliative care for metastatic vulvovaginal cancer with pmh of cerebellar hemorrhage, ckd, COPD with oxygen requirement, hypertension, hx MI who presents with CONCERN by EMS for herpes zoster and confusion.  LEVEL 5 CAVEAT DUE TO AMS. I spoke with the patient's daughter. GOALS of care include DNR/ DNI; however they wish to pursue all other medical interventions at this time. The patient has had a pruritic rash for several weeks. She was given kenalog cream, and then switched ointment. She felt like she might have had an allergic reaction. She was given "a pill" for her itching last night and felt like her tongue was swelling. This morning the patient called EMS because she felt like she was having a stroke. Patient says this to me directly. She denies a history of previous CVA. However, she has had a previous CVA, according to EMR and I believe is confused this morning. Patient complains of significant nausea.   HPI  Past Medical History:  Diagnosis Date  . Anginal pain ([place])   . Arthritis   . Blood transfusion without reported diagnosis   . Cataract   . Cervical cancer ([place])   . Chronic kidney disease   . COPD (chronic obstructive pulmonary disease) ([place])   . CVA (cerebral infarction)    Remote bilateral cerebellar infarcts on CT 8/14  . Dyslipidemia 02/28/2013  . GERD (gastroesophageal reflux disease)   . Hypertension   . Intertrochanteric fracture of right femur ([place])   . Myocardial infarction   . Osteoporosis   . Radiation 04/02/15-05/29/15   vulvar region, inguinal lymph node 60 Gu  . Stroke (cerebrum) ([place]) 01/2016  . Stroke [facility]) 1978   no deficits  . Vulvar cancer [facility])     Patient Active Problem List   Diagnosis Date Noted  . Hypertensive emergency 06/19/2016  . Metastatic cancer ([place])   . Shortness of breath   . Heart failure ([place]) 05/04/2016  . CHF (congestive heart failure) ([place]) 05/04/2016  . Abnormality of gait 03/25/2016  . CKD (chronic kidney disease) stage 4, GFR 15-29 ml/min (HCC) 03/14/2016  . Acute on chronic kidney failure ([place])   . Intracerebral hemorrhage 02/22/2016  . Cerebellar hemorrhage ([place])   . Olecranon fracture   . Intracranial hemorrhage ([place]) 02/20/2016  . Near syncope 02/20/2016  . 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 Dysuria 05/05/2015  . Primary vulvar cancer ([place]) 03/19/2015  . Urethral lesion 03/06/2015  . Vulvar mass 03/06/2015  . Left ovarian cyst 03/06/2015  . Postmenopausal vaginal bleeding 02/25/2015  . s/p hysterectomy for prolapsed uterus 02/25/2015  . Vaginal mass 02/25/2015  . History of repair for rectocele 02/25/2015  . Living accommodation issues 12/18/2014  . Knee pain, bilateral 01/09/2014  . Rotator cuff syndrome of both shoulders 01/09/2014  . Arthritis 12/02/2013  . DNR (do not resuscitate) discussion 12/02/2013  . Intertrochanteric fracture of right hip ([place]) 08/29/2013  . History of UTI 06/12/2013  . Routine health maintenance 06/12/2013  . Renal insufficiency 02/28/2013  . Dyslipidemia 02/28/2013  . Benign hypertension with CKD (chronic kidney disease) stage IV [facility])     Past Surgical History:  Procedure Laterality Date  . ABDOMINAL HYSTERECTOMY    . APPENDECTOMY    .  HERNIA REPAIR    . INTRAMEDULLARY (IM) NAIL INTERTROCHANTERIC Right 08/29/2013   Procedure: INTRAMEDULLARY (IM) NAIL INTERTROCHANTRIC;  Surgeon: Nita Sells, MD;  Location: [facility];  Service: Orthopedics;  Laterality: Right;  . TUBAL LIGATION      OB History    No data available       Home Medications    Prior to Admission medications   Medication Sig Start Date End Date Taking? Authorizing Provider  Cholecalciferol (VITAMIN D3) 3000 units TABS  Take 3,000 Units by mouth daily.    Yes Historical Provider, MD  hydrALAZINE (APRESOLINE) 25 MG tablet Take 1 tablet (25 mg total) by mouth 3 (three) times daily. 03/23/16  Yes [name], MD  loperamide (IMODIUM A-D) 2 MG tablet Take by mouth one once or twice daily as needed for diarrhea Patient taking differently: Take 2 mg by mouth 2 (two) times daily as needed for diarrhea or loose stools. Take by mouth one once or twice daily as needed for diarrhea 06/01/16  Yes [name], MD  loratadine (CLARITIN) 10 MG tablet Take 1 tablet (10 mg total) by mouth daily. 12/17/14  Yes [name], MD  omeprazole (PRILOSEC) 20 MG capsule Take 1 capsule (20 mg total) by mouth daily. 03/31/16  Yes Zenia Resides, MD  ondansetron (ZOFRAN) 4 MG tablet Take 1 tablet (4 mg total) by mouth every 8 (eight) hours as needed for nausea or vomiting. 04/27/16  Yes [name], MD  saccharomyces boulardii (FLORASTOR) 250 MG capsule Take 250 mg by mouth daily.   Yes Historical Provider, MD  traMADol (ULTRAM) 50 MG tablet Take one tablet by mouth every eight hours for pain Patient taking differently: Take 50 mg by mouth every 6 (six) hours as needed for moderate pain. Take one tablet by mouth every eight hours for pain 06/17/16  Yes [name], MD  triamcinolone cream (KENALOG) 0.1 % Apply to itchy areas 2 or 3 times a day as needed 03/23/16  Yes [name], MD  verapamil (VERELAN PM) 240 MG 24 hr capsule TAKE 1 CAPSULE BY MOUTH DAILY 02/22/16  Yes [name], MD  vitamin E 400 UNIT capsule Take by mouth.   Yes Historical Provider, MD    Family History Family History  Problem Relation Age of Onset  . Diabetes Mother   . Uterine cancer Sister     Social History Social History  Substance Use Topics  . Smoking status: Never Smoker  . Smokeless tobacco: Former Systems developer    Types: Snuff  . Alcohol use No     Allergies   Codeine and Penicillins   Review of Systems Review of Systems   Physical Exam Updated Vital  Signs BP (!) 180/85   Pulse 75   Temp 98.2 F (36.8 C) (Oral)   Resp 18   Ht 5\' 2"  (1.575 m)   Wt 45.3 kg   SpO2 95%   BMI 18.27 kg/m   Physical Exam   ED Treatments / Results  Labs (all labs ordered are listed, but only abnormal results are displayed) Labs Reviewed  MRSA PCR SCREENING - Abnormal; Notable for the following:       Result Value   MRSA by PCR POSITIVE (*)    All other components within normal limits  DIFFERENTIAL - Abnormal; Notable for the following:    Eosinophils Absolute 1.3 (*)    All other components within normal limits  COMPREHENSIVE METABOLIC PANEL - Abnormal; Notable  for the following:    CO2 21 (*)    Glucose, Bld 111 (*)    BUN 35 (*)    Creatinine, Ser 2.44 (*)    GFR calc non Af Amer 17 (*)    GFR calc Af Amer 19 (*)    All other components within normal limits  URINALYSIS, ROUTINE W REFLEX MICROSCOPIC (NOT AT [facility]) - Abnormal; Notable for the following:    Hgb urine dipstick TRACE (*)    Protein, ur >300 (*)    Leukocytes, UA SMALL (*)    All other components within normal limits  TROPONIN I - Abnormal; Notable for the following:    Troponin I 0.04 (*)    All other components within normal limits  URINE MICROSCOPIC-ADD ON - Abnormal; Notable for the following:    Squamous Epithelial / LPF 0-5 (*)    Bacteria, UA RARE (*)    All other components within normal limits  CBC - Abnormal; Notable for the following:    Hemoglobin 11.8 (*)    All other components within normal limits  CREATININE, SERUM - Abnormal; Notable for the following:    Creatinine, Ser 2.33 (*)    GFR calc non Af Amer 18 (*)    GFR calc Af Amer 20 (*)    All other components within normal limits  TROPONIN I - Abnormal; Notable for the following:    Troponin I 0.03 (*)    All other components within normal limits  I-STAT TROPOININ, ED - Abnormal; Notable for the following:    Troponin i, poc 0.15 (*)    All other components within normal limits  CBG MONITORING, ED  - Abnormal; Notable for the following:    Glucose-Capillary 110 (*)    All other components within normal limits  I-STAT CHEM 8, ED - Abnormal; Notable for the following:    Chloride 112 (*)    BUN 36 (*)    Creatinine, Ser 2.50 (*)    Glucose, Bld 107 (*)    All other components within normal limits  I-STAT CG4 LACTIC ACID, ED - Abnormal; Notable for the following:    Lactic Acid, Venous 0.37 (*)    All other components within normal limits  CULTURE, BLOOD (ROUTINE X 2)  CULTURE, BLOOD (ROUTINE X 2)  PROTIME-INR  APTT  CBC  LIPASE, BLOOD  TROPONIN I  TROPONIN I  I-STAT CG4 LACTIC ACID, ED    EKG  EKG Interpretation  Date/Time:  Sunday June 19 2016 06:08:47 EST Ventricular Rate:  84 PR Interval:    QRS Duration: 99 QT Interval:  402 QTC Calculation: 476 R Axis:   -33 Text Interpretation:  Sinus rhythm Probable left atrial enlargement Left ventricular hypertrophy Baseline wander in lead(s) II III aVF No significant change since last tracing Confirmed by [facility] MD, Junie Panning ([id number]) on 06/19/2016 6:52:33 AM Also confirmed by [facility] MD, ERIN ([id number]), editor [place] CT, Leda Gauze [phone])  on 06/19/2016 7:06:07 AM       Radiology Dg Chest 2 View  Result Date: 06/19/2016 CLINICAL DATA:  82 year old female with history of encephalopathy. Shingles. Burning and itching on head and face. EXAM: CHEST  2 VIEW COMPARISON:  Chest x-ray 05/04/2016. FINDINGS: Study is limited by patient rotation to the right which grossly distorts cardiomediastinal contours and other anatomy. With this limitation in mind there is no definite acute consolidative airspace disease and no pleural effusions. No evidence of pulmonary edema. Mild diffuse coarsening of the interstitial markings is similar to  prior studies and appears to be chronic in this patient. Heart size is mildly enlarged. Aortic atherosclerosis. IMPRESSION: 1. Limited exam demonstrating no definite radiographic evidence of acute  cardiopulmonary disease. 2. Aortic atherosclerosis. 3. Mild cardiomegaly. Electronically Signed   By: Vinnie Langton M.D.   On: 06/19/2016 08:46   Ct Head Wo Contrast  Addendum Date: 06/19/2016   ADDENDUM REPORT: 06/19/2016 06:57 ADDENDUM: Following initial interpretation by Dr. Radene Knee, the patient's status was changed to a code stroke. Areas of hypoattenuation of the deep nuclei and cortex at the ganglionic and superior ganglionic regions are not significantly changed from 02/20/2016. ASPECTS score = 10 [place] Stroke Program Early CT Score Normal score = 10 PLEASE NOTE: This does not represent a change in interpretation of the original report, only a quantification of the ASPECTS score. These results were called by telephone at the time of interpretation on 06/19/2016 at 6:56 am to EDP who verbally acknowledged these results. Electronically Signed   By: Staci Righter M.D.   On: 06/19/2016 06:57   Result Date: 06/19/2016 CLINICAL DATA:  Acute onset of burning sensation at the face. Altered mental status. Aphasia. Initial encounter. EXAM: CT HEAD WITHOUT CONTRAST TECHNIQUE: Contiguous axial images were obtained from the base of the skull through the vertex without intravenous contrast. COMPARISON:  CT of the head performed 02/20/2016, and MRI of the brain performed 02/21/2016 FINDINGS: Brain: No evidence of acute infarction, hemorrhage, hydrocephalus, extra-axial collection or mass lesion/mass effect. Prominence of the ventricles and sulci reflects mild cortical volume loss. Scattered periventricular and subcortical white matter change likely reflects small vessel ischemic microangiopathy. Mild cerebellar atrophy is noted. Chronic ischemic change is seen at the basal ganglia bilaterally, and a chronic lacunar infarct is noted at the right thalamus. The brainstem and fourth ventricle are within normal limits. The cerebral hemispheres demonstrate grossly normal gray-white differentiation. No mass effect or  midline shift is seen. Vascular: No hyperdense vessel or unexpected calcification. Calcification is seen along the vertebral and internal carotid arteries bilaterally, and minimally along the basilar artery. Skull: There is no evidence of fracture; visualized osseous structures are unremarkable in appearance. Sinuses/Orbits: The visualized portions of the orbits are within normal limits. The paranasal sinuses and mastoid air cells are well-aerated. Other: No significant soft tissue abnormalities are seen. IMPRESSION: 1. No acute intracranial pathology seen on CT. 2. Mild cortical volume loss and scattered small vessel ischemic microangiopathy. 3. Chronic ischemic change at the basal ganglia bilaterally, and chronic lacunar infarct at the right thalamus. Electronically Signed: By: Garald Balding M.D. On: 06/19/2016 06:37    Procedures .Critical Care Performed by: Margarita Mail Authorized by: Margarita Mail   Critical care provider statement:    Critical care time (minutes):  45   Critical care time was exclusive of:  Separately billable procedures and treating other patients   Critical care was necessary to treat or prevent imminent or life-threatening deterioration of the following conditions:  CNS failure or compromise   Critical care was time spent personally by me on the following activities:  Development of treatment plan with patient or surrogate, discussions with consultants, discussions with primary provider, evaluation of patient's response to treatment, examination of patient, interpretation of cardiac output measurements, obtaining history from patient or surrogate, ordering and performing treatments and interventions, ordering and review of laboratory studies, ordering and review of radiographic studies, pulse oximetry, re-evaluation of patient's condition and review of old charts   (including critical care time)  Medications Ordered in ED Medications  hydrALAZINE (APRESOLINE) tablet  25 mg (25 mg Oral Given 06/19/16 0844)  verapamil (CALAN-SR) CR tablet 240 mg (240 mg Oral Given 06/19/16 0845)  traMADol (ULTRAM) tablet 100 mg (not administered)  pantoprazole (PROTONIX) EC tablet 40 mg (not administered)  cholecalciferol (VITAMIN D) tablet 3,000 Units (not administered)  loratadine (CLARITIN) tablet 10 mg (not administered)  enoxaparin (LOVENOX) injection 30 mg (not administered)  labetalol (NORMODYNE,TRANDATE) injection 5 mg (not administered)  ondansetron (ZOFRAN) injection 4 mg (4 mg Intravenous Given 06/19/16 0645)  labetalol (NORMODYNE,TRANDATE) injection 10 mg (10 mg Intravenous Given 06/19/16 0731)     Initial Impression / Assessment and Plan / ED Course  I have reviewed the triage vital signs and the nursing notes.  Pertinent labs & imaging results that were available during my care of the patient were reviewed by me and considered in my medical decision making (see chart for details).  Clinical Course as of Jun 19 1620  Nancy Fetter Jun 19, 2016  0655 Patient with markedly elevated troponin above baseline with HTN and nausea. Concern for NSTEMI vs. Hypertensive emergency.   She is encephalotpathic. Rectal temp of 96.1 Her creatinine is at baseline and I don not believe that she has  Troponin i, poc: (!!) 0.15 [AH]  0707 Lactic acid is normal  [AH]  0715 Normal lactic acid and CT head I suspect that this is related to her hypertension. I have discussed the case with Dr. Rex Kras  who suggest labetalol.   [AH]  [id number] Patient's Troponin I appears to be at baseline for her previous values. Troponin I: (!!) 0.04 [AH]    Clinical Course User Index [AH] Margarita Mail, PA-C   Vitals:   06/19/16 1000 06/19/16 1015 06/19/16 1048 06/19/16 1200  BP: 188/78 167/92 (!) 193/78 (!) 180/85  Pulse: 81 91 80 75  Resp: 21 22 20 18   Temp:   98.3 F (36.8 C) 98.2 F (36.8 C)  TempSrc:   Oral Oral  SpO2: 97% 100% 96% 95%  Weight:   45.3 kg   Height:   5\' 2"  (1.575 m)    Patient  here with encephalopathy, her troponins are elevated, but appear at baseline. Patient given labetalol with a drop in her pressures. Her mental status improved. Negative CT of the head. Suspect hypertensive emergency with encephalopathy as a primary working diagnosis. I have spoken with the family medicine team who will admit the patient. She is stable for admission at this time Patient seen in shared visit with attending physician. Who agrees with assessment, work up , treatment, and plan for admission    Final Clinical Impressions(s) / ED Diagnoses   Final diagnoses:  Hypertensive emergency  Encephalopathy    New Prescriptions Current Discharge Medication List       Margarita Mail, PA-C 06/19/16 [place], MD 06/19/16 [phone]

## 2016-06-19 NOTE — ED Triage Notes (Signed)
Pt states she feels funny. Pt feels as though her face and head are burning and itching. Pt states she went to bed at 1900 last evening and woke up this morning at about 0400.

## 2016-06-19 NOTE — ED Notes (Signed)
Transporter came to take pt to xray, pt refusing xray at this time. Will update ED provider.

## 2016-06-19 NOTE — ED Triage Notes (Addendum)
[facility] states pt has been diagnosed with shingles within the last several days and just got prescription filled. EMS states the patient is complaining of her face burning and itching on her head and face.  Vitals 160/90, P-80, R-18, CBG-126.  Pt states she feels funny. Pt feels as though her face and head are burning and itching. Pt states she went to bed at 1900 last evening and woke up this morning at about 0400.

## 2016-06-20 ENCOUNTER — Telehealth: Payer: Self-pay | Admitting: Family Medicine

## 2016-06-20 DIAGNOSIS — I5032 Chronic diastolic (congestive) heart failure: Secondary | ICD-10-CM | POA: Diagnosis present

## 2016-06-20 DIAGNOSIS — G9340 Encephalopathy, unspecified: Secondary | ICD-10-CM | POA: Diagnosis not present

## 2016-06-20 DIAGNOSIS — I130 Hypertensive heart and chronic kidney disease with heart failure and stage 1 through stage 4 chronic kidney disease, or unspecified chronic kidney disease: Secondary | ICD-10-CM | POA: Diagnosis present

## 2016-06-20 DIAGNOSIS — E46 Unspecified protein-calorie malnutrition: Secondary | ICD-10-CM | POA: Diagnosis not present

## 2016-06-20 DIAGNOSIS — J449 Chronic obstructive pulmonary disease, unspecified: Secondary | ICD-10-CM | POA: Diagnosis present

## 2016-06-20 DIAGNOSIS — Z923 Personal history of irradiation: Secondary | ICD-10-CM | POA: Diagnosis not present

## 2016-06-20 DIAGNOSIS — Z8673 Personal history of transient ischemic attack (TIA), and cerebral infarction without residual deficits: Secondary | ICD-10-CM | POA: Diagnosis not present

## 2016-06-20 DIAGNOSIS — E785 Hyperlipidemia, unspecified: Secondary | ICD-10-CM | POA: Diagnosis present

## 2016-06-20 DIAGNOSIS — I674 Hypertensive encephalopathy: Secondary | ICD-10-CM | POA: Diagnosis present

## 2016-06-20 DIAGNOSIS — C779 Secondary and unspecified malignant neoplasm of lymph node, unspecified: Secondary | ICD-10-CM | POA: Diagnosis present

## 2016-06-20 DIAGNOSIS — N184 Chronic kidney disease, stage 4 (severe): Secondary | ICD-10-CM | POA: Diagnosis present

## 2016-06-20 DIAGNOSIS — I161 Hypertensive emergency: Secondary | ICD-10-CM | POA: Diagnosis not present

## 2016-06-20 DIAGNOSIS — Z66 Do not resuscitate: Secondary | ICD-10-CM | POA: Diagnosis present

## 2016-06-20 DIAGNOSIS — L209 Atopic dermatitis, unspecified: Secondary | ICD-10-CM | POA: Diagnosis present

## 2016-06-20 DIAGNOSIS — R350 Frequency of micturition: Secondary | ICD-10-CM | POA: Diagnosis not present

## 2016-06-20 DIAGNOSIS — C519 Malignant neoplasm of vulva, unspecified: Secondary | ICD-10-CM | POA: Diagnosis not present

## 2016-06-20 DIAGNOSIS — N185 Chronic kidney disease, stage 5: Secondary | ICD-10-CM | POA: Diagnosis not present

## 2016-06-20 DIAGNOSIS — I10 Essential (primary) hypertension: Secondary | ICD-10-CM | POA: Diagnosis not present

## 2016-06-20 DIAGNOSIS — I509 Heart failure, unspecified: Secondary | ICD-10-CM | POA: Diagnosis not present

## 2016-06-20 DIAGNOSIS — Z8541 Personal history of malignant neoplasm of cervix uteri: Secondary | ICD-10-CM | POA: Diagnosis not present

## 2016-06-20 DIAGNOSIS — Z515 Encounter for palliative care: Secondary | ICD-10-CM | POA: Diagnosis present

## 2016-06-20 DIAGNOSIS — R4182 Altered mental status, unspecified: Secondary | ICD-10-CM | POA: Diagnosis present

## 2016-06-20 DIAGNOSIS — C774 Secondary and unspecified malignant neoplasm of inguinal and lower limb lymph nodes: Secondary | ICD-10-CM | POA: Diagnosis not present

## 2016-06-20 DIAGNOSIS — L259 Unspecified contact dermatitis, unspecified cause: Secondary | ICD-10-CM | POA: Diagnosis present

## 2016-06-20 DIAGNOSIS — Z681 Body mass index (BMI) 19 or less, adult: Secondary | ICD-10-CM | POA: Diagnosis not present

## 2016-06-20 DIAGNOSIS — Z9221 Personal history of antineoplastic chemotherapy: Secondary | ICD-10-CM | POA: Diagnosis not present

## 2016-06-20 LAB — CBC
HEMATOCRIT: 35.5 % — AB (ref 36.0–46.0)
Hemoglobin: 11.3 g/dL — ABNORMAL LOW (ref 12.0–15.0)
MCH: 28.2 pg (ref 26.0–34.0)
MCHC: 31.8 g/dL (ref 30.0–36.0)
MCV: 88.5 fL (ref 78.0–100.0)
Platelets: 250 10*3/uL (ref 150–400)
RBC: 4.01 MIL/uL (ref 3.87–5.11)
RDW: 14 % (ref 11.5–15.5)
WBC: 7.3 10*3/uL (ref 4.0–10.5)

## 2016-06-20 LAB — BASIC METABOLIC PANEL
Anion gap: 9 (ref 5–15)
BUN: 32 mg/dL — AB (ref 6–20)
CHLORIDE: 111 mmol/L (ref 101–111)
CO2: 22 mmol/L (ref 22–32)
Calcium: 9.1 mg/dL (ref 8.9–10.3)
Creatinine, Ser: 2.44 mg/dL — ABNORMAL HIGH (ref 0.44–1.00)
GFR calc Af Amer: 19 mL/min — ABNORMAL LOW (ref >60)
GFR calc non Af Amer: 17 mL/min — ABNORMAL LOW (ref >60)
Glucose, Bld: 113 mg/dL — ABNORMAL HIGH (ref 65–99)
POTASSIUM: 4.7 mmol/L (ref 3.5–5.1)
SODIUM: 142 mmol/L (ref 135–145)

## 2016-06-20 LAB — TROPONIN I: Troponin I: 0.04 ng/mL (ref <0.03)

## 2016-06-20 NOTE — Progress Notes (Addendum)
[facility] Room 4E-10 [facility] Liaison  Spoke by telephone with patient's daughter, Tomasita Morrow.  She asks if discharge can be delayed, as she thinks patient needs placement.  This Probation officer explained I am not sure if that is possible, but I will speak with MD and call her back.  Efrain Sella, RN, BSN [phone]  **Spoke with Dr. Vanetta Shawl with Family Medicine to inform of the above**

## 2016-06-20 NOTE — Progress Notes (Addendum)
[facility] Room 4E-10 [facility] Liaison  This is a GIP related and Hospice covered admission of 06/19/16, per Dr. Cherie Ouch.    Noted, at home, patient is listed as a full code, but apparently did not desire intubation.  Patient is a DNR status here in the hospital.    Patient came to ER on 12/3, thinking she was having a stroke.  Patient was admitted with hypertensive crisis and AMS.  Visited patient at the bedside.  Staff RN is present in the room.  She reports patient is hypertensive, and she will being receiving a prn medication for this. Patient denies complaints, including pain.  She is eating breakfast.  This Probation officer assisted her with opening containers.  Patient reports she is from home, alone, and plans to return.  She states "i've gotta get home to my boy (her cat)."    Patient has received Labetalol 5 mg IV x 1 today for hypertension (BP of 199/90).   Attempted to reach patient's daughter, Rickie, but voicemail was left for return call.    [facility] will continue to follow daily and assist as needed.  Efrain Sella, RN, BSN [phone]  ADDENDUM:  Patient is admitted as obs.  Patient needs to be inpatient, given she is under [facility].  I spoke with Reynelda, RN, to discuss this.

## 2016-06-20 NOTE — Telephone Encounter (Signed)
[facility]

## 2016-06-20 NOTE — Discharge Summary (Signed)
[facility] Discharge Summary  Patient name: Meredith Bender Medical record number: [id number] Date of birth: 1926/03/13 Age: 82 y.o. Gender: female Date of Admission: 06/19/2016  Date of Discharge: 06/21/16 Admitting Physician: [name], MD  Primary Care Provider: [name], MD Consultants: None  Indication for Hospitalization: Hypertensive emergency and altered mental status  Discharge Diagnoses/Problem List:  Active Problems:   Hypertensive emergency   Altered mental status   Increased urinary frequency    Disposition:   Discharge Condition: Stable, improved  Discharge Exam:  General: 82yo F sitting on side of bed appearing comfortable and in NAD Cardiovascular: RRR, s1/s2 present, no murmurs rubs or gallops Respiratory: NWOB, CTABL, no wheezing or rhonci Gastrointestinal: soft, non-tender, non-distended, no organomegaly MSK: no deformities, no edema Derm: erythematous patches on upper shoulders and flanks bilaterally with multiple excoriations, not following any dermatomal pattern.  No blisters or vesicles present.  Neuro: CNII-X WNL, no FND, strength 5/5 in upper and lower extremities bilaterally, A&O Psych: normal mood and affect   Brief Hospital Course:  Patient presented to the emergency department via ambulance after calling 911 for concern of having a stroke.  In the ED she was found to have hypertensive emergency with blood pressure of 249/110.  Upon arrival to the emergency room she denied any headache, vision changes, chest pain, shortness of breath.  She was given labetalol 10 mg IV and then started on her home medication, which are verapamil 240 mg daily and hydralazine 25 mg 3 times a day.  Her blood pressures improved after starting these.  She also had a CT head and chest x-ray that were both normal.  She had no focal neurological deficits upon admission or throughout her hospitalization. This was felt to be hypertensive  emergency more than urgency in the setting of altered mental status upon admission and improvement with the blood pressure control.  We trended troponins which were all within normal limits.  Patient also presented with a rash on her bilateral upper shoulders and flanks which was previously noted in outpatient notes.  There was concern for herpes zoster from her hospice nurse, who started her on Valtrex which she received 1 dose the night prior to admission.  Her rash was more consistent with contact dermatitis or atopic dermatitis and did not have any vesicles and was not concerning for shingles.  Patient was discharged on her home medication and blood pressures were stable over 24 hours and was also discharged with a Kenalog steroid cream for her rash. She had an appointment scheduled with her PCP for the next week.   Issues for Follow Up:  1. Blood pressure: Unclear reason for acute elevations in blood pressure.  Patient may have not taking medication that night.  Discharge from her home medication and discussed return precautions. 2. Rash: Contact dermatitis prescribed patient Kenalog cream.  3. Increased urinary frequency: Patient developed these symptoms the day of discharge and we repeated UA and urine culture prior to discharge. Urine culture did not show UTI.    Significant Procedures: CT head wo contrast : see below for read  Significant Labs and Imaging:   Recent Labs Lab 06/19/16 0625 06/19/16 0645 06/19/16 1158 06/20/16 0006  WBC 9.1  --  6.7 7.3  HGB 12.9 13.3 11.8* 11.3*  HCT 40.3 39.0 36.9 35.5*  PLT 273  --  250 250    Recent Labs Lab 06/19/16 0625 06/19/16 0645 06/19/16 1158 06/20/16 0006  NA 141 143  --  142  K 4.4 4.4  --  4.7  CL 110 112*  --  111  CO2 21*  --   --  22  GLUCOSE 111* 107*  --  113*  BUN 35* 36*  --  32*  CREATININE 2.44* 2.50* 2.33* 2.44*  CALCIUM 9.1  --   --  9.1  ALKPHOS 89  --   --   --   AST 21  --   --   --   ALT 15  --   --   --    ALBUMIN 3.6  --   --   --     Results/Tests Pending at Time of Discharge: None  Discharge Medications:    Medication List    TAKE these medications   hydrALAZINE 25 MG tablet Commonly known as:  APRESOLINE Take 1 tablet (25 mg total) by mouth 3 (three) times daily.   loperamide 2 MG tablet Commonly known as:  IMODIUM A-D Take by mouth one once or twice daily as needed for diarrhea What changed:  how much to take  how to take this  when to take this  reasons to take this  additional instructions   loratadine 10 MG tablet Commonly known as:  CLARITIN Take 1 tablet (10 mg total) by mouth daily.   omeprazole 20 MG capsule Commonly known as:  PRILOSEC Take 1 capsule (20 mg total) by mouth daily.   ondansetron 4 MG tablet Commonly known as:  ZOFRAN Take 1 tablet (4 mg total) by mouth every 8 (eight) hours as needed for nausea or vomiting.   saccharomyces boulardii 250 MG capsule Commonly known as:  FLORASTOR Take 250 mg by mouth daily.   traMADol 50 MG tablet Commonly known as:  ULTRAM Take one tablet by mouth every eight hours for pain What changed:  how much to take  how to take this  when to take this  reasons to take this  additional instructions   triamcinolone cream 0.1 % Commonly known as:  KENALOG Apply to itchy areas 2 or 3 times a day as needed   verapamil 240 MG 24 hr capsule Commonly known as:  VERELAN PM TAKE 1 CAPSULE BY MOUTH DAILY   Vitamin D3 3000 units Tabs Take 3,000 Units by mouth daily.   vitamin E 400 UNIT capsule Take by mouth.       Discharge Instructions: Please refer to Patient Instructions section of EMR for full details.  Patient was counseled important signs and symptoms that should prompt return to medical care, changes in medications, dietary instructions, activity restrictions, and follow up appointments.   Follow-Up Appointments:   Eloise Levels, MD 06/20/2016, 1:43 PM PGY-1, [place]

## 2016-06-20 NOTE — Progress Notes (Signed)
Family Medicine Teaching Service Daily Progress Note Intern Pager: [phone]  Patient name: Meredith Bender Medical record number: [id number] Date of birth: 06/24/1926 Age: 82 y.o. Gender: female  Primary Care Provider: [name], MD Consultants: None Code Status: DNR/DNI  Pt Overview and Major Events to Date:  Meredith Bender is a 82 y.o. female presenting with AMS . PMH is significant for vulvar and cervical cancer, HTN, HLD, CVA, MI, GERD, COPD, CKD.  Assessment and Plan:  #Hypertensive Emergency, resolved  CT head and CXR negative. No FND. EKG showed NSR. Emergency more than urgency in the setting of AMS on admission with improvement after BP control.  Alert and oriented 3, blood pressure this morning was 199/90.  Denies any chest pain, shortness of breath, changes in vision or headache.  - continue home medications - continuous pulse ox - vital signs per floor protocol - discharge to home appropriate if BP controlled  #AMS, improved: Reasonable to think that her blood pressure could explain her AMS (hypertensive encephalopathy). CT head showed no acute pathology. No FND on exam, unlikely to be CVA.  Patient is afebrile and without leukocytosis. Lactic acid wnl. CXR normal and patient has had no recent illnesses.  No electrolyte abnormalities. Creatinine is elevated, but within her normal limits for having CKD-IV.  BMP/CBC wnl this AM.  - stop valtrex - f/u blood cultures collected in ED-- NGTD - neuro checks q2hrs x12hrs   #Rash: Patchy erythematous rash with papules for ~one month on bilateral upper shoulders and flanks. Well demarcated and pruritic with multiple excoriations. Non-vesicular. Rash on arms have resolved, but multiple excoriations present.  No changes in detergents or new clothes.  No vesicles present.  Failed kenalog 1% in outpatient.  Likely contact dermatitis or atopic dermatitis. No concern for shingles.  - hold on creams for now - consider adding steroid cream  back   #HFpEF:  Admitted in mid October for SOB 2/2 heart failure exacerbation.  Last echo was 10/17 and showed EF 55-60 and G1DD. Denies SOB, satting well on room air, lungs clear and not fluid overloaded.  Lasix DC'd by primary care doctor several days ago.  - continue to monitor - could DC with steroid cream - SLIV  #CKD stage IV: At baseline. -Daily BMET -Avoid nephrotoxic agents  #Hx vulvovaginal cancer:S/p chemoradiation. With recent diagnosis of cancer in her lymph nodes. Follows with oncology. Now a DNR/DNI with hospice/palliative care services at home. Family and patient still want medical management of her conditions. -continue home Tramadol 50 mg Q8 as needed for pain   FEN/GI: regular diet Prophylaxis: lovenox  Disposition: home  Subjective:  Patient feels well this AM and denies any headache, CP, SOB, confusion or changes in vision.  She is AAOx3.  She feels embarrassed about coming to the ED and feels that her neighbors and church members probably think she's crazy and that her kids are mad at her.  I assured her that this is not true.   Objective: Temp:  [97.5 F (36.4 C)-98.3 F (36.8 C)] 97.5 F (36.4 C) (12/04 0400) Pulse Rate:  [75-97] 87 (12/04 0405) Resp:  [14-26] 15 (12/04 0400) BP: (147-225)/(64-116) 169/78 (12/04 0405) SpO2:  [94 %-100 %] 98 % (12/04 0405) Weight:  [99 lb 13.9 oz (45.3 kg)] 99 lb 13.9 oz (45.3 kg) (12/03 1048) Physical Exam: General: 82yo F sitting on side of bed appearing comfortable and in NAD Cardiovascular: RRR, s1/s2 present, no murmurs rubs or gallops Respiratory: NWOB, CTABL, no  wheezing or rhonci Gastrointestinal: soft, non-tender, non-distended, no organomegaly MSK: no deformities, no edema Derm: erythematous patches on upper shoulders and flanks bilaterally with multiple excoriations, not following any dermatomal pattern.  No blisters or vesicles present.  Neuro: CNII-X WNL, no FND, strength 5/5 in upper and lower  extremities bilaterally, A&O Psych: normal mood and affect  Laboratory:  Recent Labs Lab 06/19/16 0625 06/19/16 0645 06/19/16 1158 06/20/16 0006  WBC 9.1  --  6.7 7.3  HGB 12.9 13.3 11.8* 11.3*  HCT 40.3 39.0 36.9 35.5*  PLT 273  --  250 250    Recent Labs Lab 06/19/16 0625 06/19/16 0645 06/19/16 1158 06/20/16 0006  NA 141 143  --  142  K 4.4 4.4  --  4.7  CL 110 112*  --  111  CO2 21*  --   --  22  BUN 35* 36*  --  32*  CREATININE 2.44* 2.50* 2.33* 2.44*  CALCIUM 9.1  --   --  9.1  PROT 6.9  --   --   --   BILITOT 0.7  --   --   --   ALKPHOS 89  --   --   --   ALT 15  --   --   --   AST 21  --   --   --   GLUCOSE 111* 107*  --  113*    Imaging/Diagnostic Tests: Dg Chest 2 View  Result Date: 06/19/2016 CLINICAL DATA:  82 year old female with history of encephalopathy. Shingles. Burning and itching on head and face. EXAM: CHEST  2 VIEW COMPARISON:  Chest x-ray 05/04/2016. FINDINGS: Study is limited by patient rotation to the right which grossly distorts cardiomediastinal contours and other anatomy. With this limitation in mind there is no definite acute consolidative airspace disease and no pleural effusions. No evidence of pulmonary edema. Mild diffuse coarsening of the interstitial markings is similar to prior studies and appears to be chronic in this patient. Heart size is mildly enlarged. Aortic atherosclerosis. IMPRESSION: 1. Limited exam demonstrating no definite radiographic evidence of acute cardiopulmonary disease. 2. Aortic atherosclerosis. 3. Mild cardiomegaly. Electronically Signed   By: Vinnie Langton M.D.   On: 06/19/2016 08:46    Eloise Levels, MD 06/20/2016, 6:55 AM PGY-1, [place] Intern pager: [phone], text pages welcome

## 2016-06-20 NOTE — Progress Notes (Addendum)
5pm  Dtr called back and has had conference with other family- they have managed to get more supervision at home but won't be prepared until tomorrow- CSW paged MD to inform of dtr's plan  No further CSW needs- signing off  4:30pm CSW called by MD to request involvement/evaluation for placement.   CSW called pt dtr Audry Pili who is inquiring about placement and informed that because patient is being cared for by hospice that her insurance would not pay for SNF room and board- informed that family could pay privately for SNF room and board which would be about $250/day.  Dtr did not think this is possible but is going to speak with pt sons.  CSW stated that if they wanted patient moved to SNF they would have to revoke hospice benefit and revert to normal Medicare but doing this would not guarentee a long SNF stay since it is unlikely patient can participate fully with rehab.  CSW spoke with pt hospice social worker to confirm above information and discuss patient situation.  Palliative CSW states that though patient is fall risk from shuffling gait she is able to get around and there are not immediate safety or care concerns at home- pt able to obtain food and is kept clean.  Pt received 4 hours of care twice a week from private aid service and hospice services come in some on other days but does not have 24 hour care.  Per medical staff patient continues to wish to return home- at this time no concerns expressed about capacity of the patient to make this decision though she is sometimes confused.  Pt dtr will call CSW back after speaking with family  Jorge Ny, [place] Social Worker [phone]

## 2016-06-20 NOTE — Progress Notes (Signed)
[facility] Work note Patient is receiving hospice services at home. She was resting in bed, yet could not remember LCSW from previous visits to the home. She reports that she is feeling ok, somewhat sleepy, yet in no pain. Patient reports ongoing itching and was unable to report findings regarding why she is itching. Patient reports that she is going home today. No family were present. LCSW did contact daughter, Rickie to offer support. She voiced stress with situation and that she was going to see patient, yet was unsure if dc is planned. She verbalized need to follw up with patient, hospital staff to help make any further decisions, changes. Support offered. Katherina Right, [place]

## 2016-06-21 ENCOUNTER — Other Ambulatory Visit: Payer: Self-pay | Admitting: Family Medicine

## 2016-06-21 DIAGNOSIS — I10 Essential (primary) hypertension: Secondary | ICD-10-CM

## 2016-06-21 DIAGNOSIS — I5032 Chronic diastolic (congestive) heart failure: Secondary | ICD-10-CM

## 2016-06-21 DIAGNOSIS — R350 Frequency of micturition: Secondary | ICD-10-CM

## 2016-06-21 LAB — BASIC METABOLIC PANEL
Anion gap: 9 (ref 5–15)
BUN: 30 mg/dL — ABNORMAL HIGH (ref 6–20)
CHLORIDE: 108 mmol/L (ref 101–111)
CO2: 22 mmol/L (ref 22–32)
CREATININE: 2.71 mg/dL — AB (ref 0.44–1.00)
Calcium: 8.9 mg/dL (ref 8.9–10.3)
GFR calc non Af Amer: 15 mL/min — ABNORMAL LOW (ref >60)
GFR, EST AFRICAN AMERICAN: 17 mL/min — AB (ref >60)
Glucose, Bld: 97 mg/dL (ref 65–99)
POTASSIUM: 4 mmol/L (ref 3.5–5.1)
Sodium: 139 mmol/L (ref 135–145)

## 2016-06-21 LAB — CBC
HEMATOCRIT: 34.2 % — AB (ref 36.0–46.0)
HEMOGLOBIN: 11 g/dL — AB (ref 12.0–15.0)
MCH: 28.4 pg (ref 26.0–34.0)
MCHC: 32.2 g/dL (ref 30.0–36.0)
MCV: 88.1 fL (ref 78.0–100.0)
Platelets: 238 10*3/uL (ref 150–400)
RBC: 3.88 MIL/uL (ref 3.87–5.11)
RDW: 14 % (ref 11.5–15.5)
WBC: 6.7 10*3/uL (ref 4.0–10.5)

## 2016-06-21 MED ORDER — MUPIROCIN 2 % EX OINT: 1.0000 "application " | TOPICAL_OINTMENT | Freq: Two times a day (BID) | CUTANEOUS | Status: DC

## 2016-06-21 MED ORDER — TRIAMCINOLONE ACETONIDE 0.1 % EX CREA: 1.0000 "application " | TOPICAL_CREAM | Freq: Two times a day (BID) | CUTANEOUS | 0 refills | Status: DC

## 2016-06-21 MED ORDER — CHLORHEXIDINE GLUCONATE CLOTH 2 % EX PADS: 6.0000 | MEDICATED_PAD | Freq: Every day | CUTANEOUS | Status: DC

## 2016-06-21 NOTE — Progress Notes (Signed)
Family Medicine Teaching Service Daily Progress Note Intern Pager: [phone]  Patient name: Meredith Bender Medical record number: [id number] Date of birth: 1926-03-07 Age: 82 y.o. Gender: female  Primary Care Provider: [name], MD Consultants: None Code Status: DNR/DNI  Pt Overview and Major Events to Date:  Meredith Bender is a 82 y.o. female presenting with AMS . PMH is significant for vulvar and cervical cancer, HTN, HLD, CVA, MI, GERD, COPD, CKD.  Assessment and Plan:  #Hypertensive Emergency, resolved  CT head and CXR negative. No FND. EKG showed NSR. Emergency more than urgency in the setting of AMS on admission with improvement after BP control.  Alert and oriented 3, blood pressure this morning was 159/77.  Denies any chest pain, shortness of breath, changes in vision or headache.  - continue home medications - continuous pulse ox - vital signs per floor protocol - discharge to home today is appropriate.   #AMS, improved: Reasonable to think that her blood pressure could explain her AMS (hypertensive encephalopathy). CT head showed no acute pathology. No FND on exam, unlikely to be CVA.  Patient is afebrile and without leukocytosis. Lactic acid wnl. CXR normal and patient has had no recent illnesses.  No electrolyte abnormalities. Creatinine is elevated, but within her normal limits for having CKD-IV.  BMP/CBC wnl this AM.  - stop valtrex - f/u blood cultures collected in ED-- NGTD - neuro checks q2hrs x12hrs  # Increased urinary urgency: Patient notes increased urgency without dysuria.  UA in the ED showed small leukocyte esterase and 6-30 WBC.   - repeat UA with reflex culture  #Rash: Patchy erythematous rash with papules for ~one month on bilateral upper shoulders and flanks. Well demarcated and pruritic with multiple excoriations. Non-vesicular. Rash on arms have resolved, but multiple excoriations present.  No changes in detergents or new clothes.  No vesicles present.   Failed kenalog 1% in outpatient.  Likely contact dermatitis or atopic dermatitis. No concern for shingles.  - hold on creams for now - d/c with steroid cream  #HFpEF:  Admitted in mid October for SOB 2/2 heart failure exacerbation.  Last echo was 10/17 and showed EF 55-60 and G1DD. Denies SOB, satting well on room air, lungs clear and not fluid overloaded.  Lasix DC'd by primary care doctor several days ago.  - continue to monitor - SLIV  #CKD stage IV: At baseline. -Daily BMET -Avoid nephrotoxic agents  #Hx vulvovaginal cancer:S/p chemoradiation. With recent diagnosis of cancer in her lymph nodes. Follows with oncology. Now a DNR/DNI with hospice/palliative care services at home. Family and patient still want medical management of her conditions. -continue home Tramadol 50 mg Q8 as needed for pain   FEN/GI: regular diet Prophylaxis: lovenox  Disposition: home  Subjective:  Patient feels well this AM and denies any headache, CP, SOB, confusion or changes in vision.  She is AAOx3.  Ready to go home.   Objective: Temp:  [97.3 F (36.3 C)-98.4 F (36.9 C)] 98.4 F (36.9 C) (12/05 0400) Pulse Rate:  [66-89] 89 (12/05 0500) Resp:  [7-31] 19 (12/05 0500) BP: (129-199)/(53-90) 166/81 (12/05 0500) SpO2:  [96 %-99 %] 97 % (12/05 0500) Weight:  [103 lb 2.8 oz (46.8 kg)] 103 lb 2.8 oz (46.8 kg) (12/04 0904) Physical Exam: General: 82yo F sitting on side of bed appearing comfortable and in NAD Cardiovascular: RRR, s1/s2 present, no murmurs rubs or gallops Respiratory: NWOB, CTABL, no wheezing or rhonci Gastrointestinal: soft, non-tender, non-distended, no organomegaly MSK: no  deformities, no edema Derm: erythematous patches on upper shoulders and flanks bilaterally with multiple excoriations, not following any dermatomal pattern.  No blisters or vesicles present.  Neuro: CNII-X WNL, no FND, strength 5/5 in upper and lower extremities bilaterally, A&O Psych: normal mood and  affect  Laboratory:  Recent Labs Lab 06/19/16 1158 06/20/16 0006 06/21/16 0145  WBC 6.7 7.3 6.7  HGB 11.8* 11.3* 11.0*  HCT 36.9 35.5* 34.2*  PLT 250 250 238    Recent Labs Lab 06/19/16 0625 06/19/16 0645 06/19/16 1158 06/20/16 0006 06/21/16 0145  NA 141 143  --  142 139  K 4.4 4.4  --  4.7 4.0  CL 110 112*  --  111 108  CO2 21*  --   --  22 22  BUN 35* 36*  --  32* 30*  CREATININE 2.44* 2.50* 2.33* 2.44* 2.71*  CALCIUM 9.1  --   --  9.1 8.9  PROT 6.9  --   --   --   --   BILITOT 0.7  --   --   --   --   ALKPHOS 89  --   --   --   --   ALT 15  --   --   --   --   AST 21  --   --   --   --   GLUCOSE 111* 107*  --  113* 97    Imaging/Diagnostic Tests: No results found.  Eloise Levels, MD 06/21/2016, 7:17 AM PGY-1, [place] Intern pager: [phone], text pages welcome

## 2016-06-21 NOTE — Progress Notes (Signed)
[facility] RN note  Spoke by telephone with patient's daughter, Rickie.  She states plan is for patient to return home.  She requests maximum services available from [facility].  Rickie states they may need to consider SNF, in the future.    Efrain Sella, RN, BSN [phone]

## 2016-06-21 NOTE — Discharge Instructions (Signed)
You were admitted to the hospital for high blood pressure and confusion.  This had improved now.  You can leave on your home BP medication.  Please follow up with Dr. Nori Riis on 03/30/16 at 9:15AM.   If you develop any SOB, headache or confusion please be seen urgently.

## 2016-06-22 ENCOUNTER — Encounter: Payer: Self-pay | Admitting: Family Medicine

## 2016-06-22 DIAGNOSIS — I10 Essential (primary) hypertension: Secondary | ICD-10-CM | POA: Diagnosis not present

## 2016-06-22 DIAGNOSIS — C774 Secondary and unspecified malignant neoplasm of inguinal and lower limb lymph nodes: Secondary | ICD-10-CM | POA: Diagnosis not present

## 2016-06-22 DIAGNOSIS — I509 Heart failure, unspecified: Secondary | ICD-10-CM | POA: Diagnosis not present

## 2016-06-22 DIAGNOSIS — N185 Chronic kidney disease, stage 5: Secondary | ICD-10-CM | POA: Diagnosis not present

## 2016-06-22 DIAGNOSIS — E46 Unspecified protein-calorie malnutrition: Secondary | ICD-10-CM | POA: Diagnosis not present

## 2016-06-22 DIAGNOSIS — C519 Malignant neoplasm of vulva, unspecified: Secondary | ICD-10-CM | POA: Diagnosis not present

## 2016-06-22 LAB — URINE CULTURE: Culture: <10000 — AB

## 2016-06-24 LAB — CULTURE, BLOOD (ROUTINE X 2)
CULTURE: NO GROWTH
Culture: NO GROWTH

## 2016-06-27 DIAGNOSIS — C774 Secondary and unspecified malignant neoplasm of inguinal and lower limb lymph nodes: Secondary | ICD-10-CM | POA: Diagnosis not present

## 2016-06-27 DIAGNOSIS — I10 Essential (primary) hypertension: Secondary | ICD-10-CM | POA: Diagnosis not present

## 2016-06-27 DIAGNOSIS — I509 Heart failure, unspecified: Secondary | ICD-10-CM | POA: Diagnosis not present

## 2016-06-27 DIAGNOSIS — E46 Unspecified protein-calorie malnutrition: Secondary | ICD-10-CM | POA: Diagnosis not present

## 2016-06-27 DIAGNOSIS — C519 Malignant neoplasm of vulva, unspecified: Secondary | ICD-10-CM | POA: Diagnosis not present

## 2016-06-27 DIAGNOSIS — N185 Chronic kidney disease, stage 5: Secondary | ICD-10-CM | POA: Diagnosis not present

## 2016-06-29 ENCOUNTER — Encounter: Payer: Self-pay | Admitting: Family Medicine

## 2016-06-29 ENCOUNTER — Ambulatory Visit (INDEPENDENT_AMBULATORY_CARE_PROVIDER_SITE_OTHER): Payer: Medicare Other | Admitting: Family Medicine

## 2016-06-29 VITALS — BP 150/54 | HR 26 | Temp 98.1°F | Ht 62.0 in | Wt 100.2 lb

## 2016-06-29 DIAGNOSIS — N184 Chronic kidney disease, stage 4 (severe): Secondary | ICD-10-CM

## 2016-06-29 DIAGNOSIS — I129 Hypertensive chronic kidney disease with stage 1 through stage 4 chronic kidney disease, or unspecified chronic kidney disease: Secondary | ICD-10-CM | POA: Diagnosis present

## 2016-06-30 DIAGNOSIS — I10 Essential (primary) hypertension: Secondary | ICD-10-CM | POA: Diagnosis not present

## 2016-06-30 DIAGNOSIS — C519 Malignant neoplasm of vulva, unspecified: Secondary | ICD-10-CM | POA: Diagnosis not present

## 2016-06-30 DIAGNOSIS — C774 Secondary and unspecified malignant neoplasm of inguinal and lower limb lymph nodes: Secondary | ICD-10-CM | POA: Diagnosis not present

## 2016-06-30 DIAGNOSIS — E46 Unspecified protein-calorie malnutrition: Secondary | ICD-10-CM | POA: Diagnosis not present

## 2016-06-30 DIAGNOSIS — I509 Heart failure, unspecified: Secondary | ICD-10-CM | POA: Diagnosis not present

## 2016-06-30 DIAGNOSIS — N185 Chronic kidney disease, stage 5: Secondary | ICD-10-CM | POA: Diagnosis not present

## 2016-06-30 NOTE — Patient Instructions (Signed)
See me in 2-3 m Call w questions or problems

## 2016-06-30 NOTE — Progress Notes (Signed)
TRANSITION OF CARE VISIT   Primary Care Physician (PCP): Dr Elpidio Eric [facility]                                                   [address]                                                   [place], [place] [address]                                                   [place]  Date of Admission: 06/19/2016  Date of Discharge: 06/21/2016  Discharged from: [facility]  Discharge Diagnosis: Hypertensive emergency  Summary of Admission: Patient admitted with altered mental status, increased urinary frequency and found to have significant elevated blood pressure. Restarted on home blood pressure medicines and mental status changes resolved.  TODAY's VISIT  Patient/Caregiver self-reported problems/concerns: Daughters only concern is how would she get her mom to be more adherent to her medication plan.  MEDICATIONS  Medication Reconciliation conducted with patient/caregiver? (Yes/ No): Yes  New medications prescribed/discontinued upon discharge? No  Barriers identified related to medications: Forgetfulness on the patient's part  LABS  Lab Reviewed (Yes/No/NA): Yes  PHYSICAL EXAM:  GEN.: Well-developed thin female no acute distress CV: She had a little irregularity to her rhythm and I listened to it for a long time basically she's having occasional early beat. It is not in atrial fibrillation is in normal sinus rhythm. LUNGS: Clear to auscultation PSYCH: Alert 4. Very pleasant. Seems very happy. Asks and answers questions appropriately. Speech is normal in content and fluency. Recent remote memory appear to be intact. SKIN: Still has some areas on the upper back and on the flanks where she has some dry slightly excoriated skin. There is no sign of vesicles or specific lesions.  ASSESSMENT:   PATIENT EDUCATION PROVIDED: See AVS   FOLLOW-UP (Include any further testing  or referrals): I will see her back in 2-3 months and when necessary. Her daughter is working to get home health aide in there and additional day or 2 a week to make sure that we get her medication administered on a daily basis. We discussed this to some length. We also discussed the slight irregularity of her heart rate today. Benign in my estimation. She's doing very well. She honestly has some forgetfulness at home and she is more frail than she was this time last year but all in all she's doing pretty well.

## 2016-07-04 DIAGNOSIS — N185 Chronic kidney disease, stage 5: Secondary | ICD-10-CM | POA: Diagnosis not present

## 2016-07-04 DIAGNOSIS — I10 Essential (primary) hypertension: Secondary | ICD-10-CM | POA: Diagnosis not present

## 2016-07-04 DIAGNOSIS — E46 Unspecified protein-calorie malnutrition: Secondary | ICD-10-CM | POA: Diagnosis not present

## 2016-07-04 DIAGNOSIS — I509 Heart failure, unspecified: Secondary | ICD-10-CM | POA: Diagnosis not present

## 2016-07-04 DIAGNOSIS — C774 Secondary and unspecified malignant neoplasm of inguinal and lower limb lymph nodes: Secondary | ICD-10-CM | POA: Diagnosis not present

## 2016-07-04 DIAGNOSIS — C519 Malignant neoplasm of vulva, unspecified: Secondary | ICD-10-CM | POA: Diagnosis not present

## 2016-07-06 DIAGNOSIS — C519 Malignant neoplasm of vulva, unspecified: Secondary | ICD-10-CM | POA: Diagnosis not present

## 2016-07-06 DIAGNOSIS — I10 Essential (primary) hypertension: Secondary | ICD-10-CM | POA: Diagnosis not present

## 2016-07-06 DIAGNOSIS — C774 Secondary and unspecified malignant neoplasm of inguinal and lower limb lymph nodes: Secondary | ICD-10-CM | POA: Diagnosis not present

## 2016-07-06 DIAGNOSIS — N185 Chronic kidney disease, stage 5: Secondary | ICD-10-CM | POA: Diagnosis not present

## 2016-07-06 DIAGNOSIS — I509 Heart failure, unspecified: Secondary | ICD-10-CM | POA: Diagnosis not present

## 2016-07-06 DIAGNOSIS — E46 Unspecified protein-calorie malnutrition: Secondary | ICD-10-CM | POA: Diagnosis not present

## 2016-07-07 DIAGNOSIS — E46 Unspecified protein-calorie malnutrition: Secondary | ICD-10-CM | POA: Diagnosis not present

## 2016-07-07 DIAGNOSIS — N185 Chronic kidney disease, stage 5: Secondary | ICD-10-CM | POA: Diagnosis not present

## 2016-07-07 DIAGNOSIS — I509 Heart failure, unspecified: Secondary | ICD-10-CM | POA: Diagnosis not present

## 2016-07-07 DIAGNOSIS — I10 Essential (primary) hypertension: Secondary | ICD-10-CM | POA: Diagnosis not present

## 2016-07-07 DIAGNOSIS — C774 Secondary and unspecified malignant neoplasm of inguinal and lower limb lymph nodes: Secondary | ICD-10-CM | POA: Diagnosis not present

## 2016-07-07 DIAGNOSIS — C519 Malignant neoplasm of vulva, unspecified: Secondary | ICD-10-CM | POA: Diagnosis not present

## 2016-07-14 DIAGNOSIS — I10 Essential (primary) hypertension: Secondary | ICD-10-CM | POA: Diagnosis not present

## 2016-07-14 DIAGNOSIS — N185 Chronic kidney disease, stage 5: Secondary | ICD-10-CM | POA: Diagnosis not present

## 2016-07-14 DIAGNOSIS — C519 Malignant neoplasm of vulva, unspecified: Secondary | ICD-10-CM | POA: Diagnosis not present

## 2016-07-14 DIAGNOSIS — E46 Unspecified protein-calorie malnutrition: Secondary | ICD-10-CM | POA: Diagnosis not present

## 2016-07-14 DIAGNOSIS — I509 Heart failure, unspecified: Secondary | ICD-10-CM | POA: Diagnosis not present

## 2016-07-14 DIAGNOSIS — C774 Secondary and unspecified malignant neoplasm of inguinal and lower limb lymph nodes: Secondary | ICD-10-CM | POA: Diagnosis not present

## 2016-07-18 DIAGNOSIS — I10 Essential (primary) hypertension: Secondary | ICD-10-CM | POA: Diagnosis not present

## 2016-07-18 DIAGNOSIS — J449 Chronic obstructive pulmonary disease, unspecified: Secondary | ICD-10-CM | POA: Diagnosis not present

## 2016-07-18 DIAGNOSIS — N185 Chronic kidney disease, stage 5: Secondary | ICD-10-CM | POA: Diagnosis not present

## 2016-07-18 DIAGNOSIS — E46 Unspecified protein-calorie malnutrition: Secondary | ICD-10-CM | POA: Diagnosis not present

## 2016-07-18 DIAGNOSIS — K219 Gastro-esophageal reflux disease without esophagitis: Secondary | ICD-10-CM | POA: Diagnosis not present

## 2016-07-18 DIAGNOSIS — C774 Secondary and unspecified malignant neoplasm of inguinal and lower limb lymph nodes: Secondary | ICD-10-CM | POA: Diagnosis not present

## 2016-07-18 DIAGNOSIS — E785 Hyperlipidemia, unspecified: Secondary | ICD-10-CM | POA: Diagnosis not present

## 2016-07-18 DIAGNOSIS — C519 Malignant neoplasm of vulva, unspecified: Secondary | ICD-10-CM | POA: Diagnosis not present

## 2016-07-18 DIAGNOSIS — I509 Heart failure, unspecified: Secondary | ICD-10-CM | POA: Diagnosis not present

## 2016-07-25 DIAGNOSIS — N185 Chronic kidney disease, stage 5: Secondary | ICD-10-CM | POA: Diagnosis not present

## 2016-07-25 DIAGNOSIS — E46 Unspecified protein-calorie malnutrition: Secondary | ICD-10-CM | POA: Diagnosis not present

## 2016-07-25 DIAGNOSIS — I10 Essential (primary) hypertension: Secondary | ICD-10-CM | POA: Diagnosis not present

## 2016-07-25 DIAGNOSIS — C774 Secondary and unspecified malignant neoplasm of inguinal and lower limb lymph nodes: Secondary | ICD-10-CM | POA: Diagnosis not present

## 2016-07-25 DIAGNOSIS — C519 Malignant neoplasm of vulva, unspecified: Secondary | ICD-10-CM | POA: Diagnosis not present

## 2016-07-25 DIAGNOSIS — I509 Heart failure, unspecified: Secondary | ICD-10-CM | POA: Diagnosis not present

## 2016-07-26 DIAGNOSIS — I509 Heart failure, unspecified: Secondary | ICD-10-CM | POA: Diagnosis not present

## 2016-07-26 DIAGNOSIS — C774 Secondary and unspecified malignant neoplasm of inguinal and lower limb lymph nodes: Secondary | ICD-10-CM | POA: Diagnosis not present

## 2016-07-26 DIAGNOSIS — N185 Chronic kidney disease, stage 5: Secondary | ICD-10-CM | POA: Diagnosis not present

## 2016-07-26 DIAGNOSIS — I10 Essential (primary) hypertension: Secondary | ICD-10-CM | POA: Diagnosis not present

## 2016-07-26 DIAGNOSIS — C519 Malignant neoplasm of vulva, unspecified: Secondary | ICD-10-CM | POA: Diagnosis not present

## 2016-07-26 DIAGNOSIS — E46 Unspecified protein-calorie malnutrition: Secondary | ICD-10-CM | POA: Diagnosis not present

## 2016-08-02 DIAGNOSIS — I10 Essential (primary) hypertension: Secondary | ICD-10-CM | POA: Diagnosis not present

## 2016-08-02 DIAGNOSIS — E46 Unspecified protein-calorie malnutrition: Secondary | ICD-10-CM | POA: Diagnosis not present

## 2016-08-02 DIAGNOSIS — N185 Chronic kidney disease, stage 5: Secondary | ICD-10-CM | POA: Diagnosis not present

## 2016-08-02 DIAGNOSIS — C519 Malignant neoplasm of vulva, unspecified: Secondary | ICD-10-CM | POA: Diagnosis not present

## 2016-08-02 DIAGNOSIS — I509 Heart failure, unspecified: Secondary | ICD-10-CM | POA: Diagnosis not present

## 2016-08-02 DIAGNOSIS — C774 Secondary and unspecified malignant neoplasm of inguinal and lower limb lymph nodes: Secondary | ICD-10-CM | POA: Diagnosis not present

## 2016-08-08 DIAGNOSIS — I509 Heart failure, unspecified: Secondary | ICD-10-CM | POA: Diagnosis not present

## 2016-08-08 DIAGNOSIS — N185 Chronic kidney disease, stage 5: Secondary | ICD-10-CM | POA: Diagnosis not present

## 2016-08-08 DIAGNOSIS — E46 Unspecified protein-calorie malnutrition: Secondary | ICD-10-CM | POA: Diagnosis not present

## 2016-08-08 DIAGNOSIS — C774 Secondary and unspecified malignant neoplasm of inguinal and lower limb lymph nodes: Secondary | ICD-10-CM | POA: Diagnosis not present

## 2016-08-08 DIAGNOSIS — I10 Essential (primary) hypertension: Secondary | ICD-10-CM | POA: Diagnosis not present

## 2016-08-08 DIAGNOSIS — C519 Malignant neoplasm of vulva, unspecified: Secondary | ICD-10-CM | POA: Diagnosis not present

## 2016-08-17 DIAGNOSIS — C519 Malignant neoplasm of vulva, unspecified: Secondary | ICD-10-CM | POA: Diagnosis not present

## 2016-08-17 DIAGNOSIS — E46 Unspecified protein-calorie malnutrition: Secondary | ICD-10-CM | POA: Diagnosis not present

## 2016-08-17 DIAGNOSIS — N185 Chronic kidney disease, stage 5: Secondary | ICD-10-CM | POA: Diagnosis not present

## 2016-08-17 DIAGNOSIS — C774 Secondary and unspecified malignant neoplasm of inguinal and lower limb lymph nodes: Secondary | ICD-10-CM | POA: Diagnosis not present

## 2016-08-17 DIAGNOSIS — I509 Heart failure, unspecified: Secondary | ICD-10-CM | POA: Diagnosis not present

## 2016-08-17 DIAGNOSIS — I10 Essential (primary) hypertension: Secondary | ICD-10-CM | POA: Diagnosis not present

## 2016-08-18 DIAGNOSIS — I509 Heart failure, unspecified: Secondary | ICD-10-CM | POA: Diagnosis not present

## 2016-08-18 DIAGNOSIS — I10 Essential (primary) hypertension: Secondary | ICD-10-CM | POA: Diagnosis not present

## 2016-08-18 DIAGNOSIS — J449 Chronic obstructive pulmonary disease, unspecified: Secondary | ICD-10-CM | POA: Diagnosis not present

## 2016-08-18 DIAGNOSIS — C774 Secondary and unspecified malignant neoplasm of inguinal and lower limb lymph nodes: Secondary | ICD-10-CM | POA: Diagnosis not present

## 2016-08-18 DIAGNOSIS — C519 Malignant neoplasm of vulva, unspecified: Secondary | ICD-10-CM | POA: Diagnosis not present

## 2016-08-18 DIAGNOSIS — E785 Hyperlipidemia, unspecified: Secondary | ICD-10-CM | POA: Diagnosis not present

## 2016-08-18 DIAGNOSIS — N185 Chronic kidney disease, stage 5: Secondary | ICD-10-CM | POA: Diagnosis not present

## 2016-08-18 DIAGNOSIS — E46 Unspecified protein-calorie malnutrition: Secondary | ICD-10-CM | POA: Diagnosis not present

## 2016-08-18 DIAGNOSIS — K219 Gastro-esophageal reflux disease without esophagitis: Secondary | ICD-10-CM | POA: Diagnosis not present

## 2016-08-22 DIAGNOSIS — C774 Secondary and unspecified malignant neoplasm of inguinal and lower limb lymph nodes: Secondary | ICD-10-CM | POA: Diagnosis not present

## 2016-08-22 DIAGNOSIS — I509 Heart failure, unspecified: Secondary | ICD-10-CM | POA: Diagnosis not present

## 2016-08-22 DIAGNOSIS — I10 Essential (primary) hypertension: Secondary | ICD-10-CM | POA: Diagnosis not present

## 2016-08-22 DIAGNOSIS — E46 Unspecified protein-calorie malnutrition: Secondary | ICD-10-CM | POA: Diagnosis not present

## 2016-08-22 DIAGNOSIS — N185 Chronic kidney disease, stage 5: Secondary | ICD-10-CM | POA: Diagnosis not present

## 2016-08-22 DIAGNOSIS — C519 Malignant neoplasm of vulva, unspecified: Secondary | ICD-10-CM | POA: Diagnosis not present

## 2016-08-23 DIAGNOSIS — I10 Essential (primary) hypertension: Secondary | ICD-10-CM | POA: Diagnosis not present

## 2016-08-23 DIAGNOSIS — C774 Secondary and unspecified malignant neoplasm of inguinal and lower limb lymph nodes: Secondary | ICD-10-CM | POA: Diagnosis not present

## 2016-08-23 DIAGNOSIS — E46 Unspecified protein-calorie malnutrition: Secondary | ICD-10-CM | POA: Diagnosis not present

## 2016-08-23 DIAGNOSIS — I509 Heart failure, unspecified: Secondary | ICD-10-CM | POA: Diagnosis not present

## 2016-08-23 DIAGNOSIS — N185 Chronic kidney disease, stage 5: Secondary | ICD-10-CM | POA: Diagnosis not present

## 2016-08-23 DIAGNOSIS — C519 Malignant neoplasm of vulva, unspecified: Secondary | ICD-10-CM | POA: Diagnosis not present

## 2016-08-29 DIAGNOSIS — I10 Essential (primary) hypertension: Secondary | ICD-10-CM | POA: Diagnosis not present

## 2016-08-29 DIAGNOSIS — E46 Unspecified protein-calorie malnutrition: Secondary | ICD-10-CM | POA: Diagnosis not present

## 2016-08-29 DIAGNOSIS — C774 Secondary and unspecified malignant neoplasm of inguinal and lower limb lymph nodes: Secondary | ICD-10-CM | POA: Diagnosis not present

## 2016-08-29 DIAGNOSIS — I509 Heart failure, unspecified: Secondary | ICD-10-CM | POA: Diagnosis not present

## 2016-08-29 DIAGNOSIS — N185 Chronic kidney disease, stage 5: Secondary | ICD-10-CM | POA: Diagnosis not present

## 2016-08-29 DIAGNOSIS — C519 Malignant neoplasm of vulva, unspecified: Secondary | ICD-10-CM | POA: Diagnosis not present

## 2016-09-02 DIAGNOSIS — I10 Essential (primary) hypertension: Secondary | ICD-10-CM | POA: Diagnosis not present

## 2016-09-02 DIAGNOSIS — N185 Chronic kidney disease, stage 5: Secondary | ICD-10-CM | POA: Diagnosis not present

## 2016-09-02 DIAGNOSIS — E46 Unspecified protein-calorie malnutrition: Secondary | ICD-10-CM | POA: Diagnosis not present

## 2016-09-02 DIAGNOSIS — I509 Heart failure, unspecified: Secondary | ICD-10-CM | POA: Diagnosis not present

## 2016-09-02 DIAGNOSIS — C774 Secondary and unspecified malignant neoplasm of inguinal and lower limb lymph nodes: Secondary | ICD-10-CM | POA: Diagnosis not present

## 2016-09-02 DIAGNOSIS — C519 Malignant neoplasm of vulva, unspecified: Secondary | ICD-10-CM | POA: Diagnosis not present

## 2016-09-09 DIAGNOSIS — I10 Essential (primary) hypertension: Secondary | ICD-10-CM | POA: Diagnosis not present

## 2016-09-09 DIAGNOSIS — E46 Unspecified protein-calorie malnutrition: Secondary | ICD-10-CM | POA: Diagnosis not present

## 2016-09-09 DIAGNOSIS — C774 Secondary and unspecified malignant neoplasm of inguinal and lower limb lymph nodes: Secondary | ICD-10-CM | POA: Diagnosis not present

## 2016-09-09 DIAGNOSIS — C519 Malignant neoplasm of vulva, unspecified: Secondary | ICD-10-CM | POA: Diagnosis not present

## 2016-09-09 DIAGNOSIS — N185 Chronic kidney disease, stage 5: Secondary | ICD-10-CM | POA: Diagnosis not present

## 2016-09-09 DIAGNOSIS — I509 Heart failure, unspecified: Secondary | ICD-10-CM | POA: Diagnosis not present

## 2016-09-15 DIAGNOSIS — I10 Essential (primary) hypertension: Secondary | ICD-10-CM | POA: Diagnosis not present

## 2016-09-15 DIAGNOSIS — N185 Chronic kidney disease, stage 5: Secondary | ICD-10-CM | POA: Diagnosis not present

## 2016-09-15 DIAGNOSIS — E46 Unspecified protein-calorie malnutrition: Secondary | ICD-10-CM | POA: Diagnosis not present

## 2016-09-15 DIAGNOSIS — C774 Secondary and unspecified malignant neoplasm of inguinal and lower limb lymph nodes: Secondary | ICD-10-CM | POA: Diagnosis not present

## 2016-09-15 DIAGNOSIS — C519 Malignant neoplasm of vulva, unspecified: Secondary | ICD-10-CM | POA: Diagnosis not present

## 2016-09-15 DIAGNOSIS — E785 Hyperlipidemia, unspecified: Secondary | ICD-10-CM | POA: Diagnosis not present

## 2016-09-15 DIAGNOSIS — J449 Chronic obstructive pulmonary disease, unspecified: Secondary | ICD-10-CM | POA: Diagnosis not present

## 2016-09-15 DIAGNOSIS — K219 Gastro-esophageal reflux disease without esophagitis: Secondary | ICD-10-CM | POA: Diagnosis not present

## 2016-09-15 DIAGNOSIS — I509 Heart failure, unspecified: Secondary | ICD-10-CM | POA: Diagnosis not present

## 2016-09-19 DIAGNOSIS — N185 Chronic kidney disease, stage 5: Secondary | ICD-10-CM | POA: Diagnosis not present

## 2016-09-19 DIAGNOSIS — I509 Heart failure, unspecified: Secondary | ICD-10-CM | POA: Diagnosis not present

## 2016-09-19 DIAGNOSIS — I10 Essential (primary) hypertension: Secondary | ICD-10-CM | POA: Diagnosis not present

## 2016-09-19 DIAGNOSIS — E46 Unspecified protein-calorie malnutrition: Secondary | ICD-10-CM | POA: Diagnosis not present

## 2016-09-19 DIAGNOSIS — C774 Secondary and unspecified malignant neoplasm of inguinal and lower limb lymph nodes: Secondary | ICD-10-CM | POA: Diagnosis not present

## 2016-09-19 DIAGNOSIS — C519 Malignant neoplasm of vulva, unspecified: Secondary | ICD-10-CM | POA: Diagnosis not present

## 2016-09-20 ENCOUNTER — Telehealth: Payer: Self-pay | Admitting: Family Medicine

## 2016-09-20 NOTE — Telephone Encounter (Signed)
Pts bp was higher than normal yesterday. 180/78.  She had missed 2 doses of her hydralazine yesterday. Nurse had pt take a dose and waited before checking bp again. This time it was 178/78-not much change.  Pt was also drinking coffee with caffenine. Just wanted Dr Meredith Bender to be aware of this

## 2016-09-28 DIAGNOSIS — E46 Unspecified protein-calorie malnutrition: Secondary | ICD-10-CM | POA: Diagnosis not present

## 2016-09-28 DIAGNOSIS — I10 Essential (primary) hypertension: Secondary | ICD-10-CM | POA: Diagnosis not present

## 2016-09-28 DIAGNOSIS — C519 Malignant neoplasm of vulva, unspecified: Secondary | ICD-10-CM | POA: Diagnosis not present

## 2016-09-28 DIAGNOSIS — I509 Heart failure, unspecified: Secondary | ICD-10-CM | POA: Diagnosis not present

## 2016-09-28 DIAGNOSIS — N185 Chronic kidney disease, stage 5: Secondary | ICD-10-CM | POA: Diagnosis not present

## 2016-09-28 DIAGNOSIS — C774 Secondary and unspecified malignant neoplasm of inguinal and lower limb lymph nodes: Secondary | ICD-10-CM | POA: Diagnosis not present

## 2016-09-29 ENCOUNTER — Other Ambulatory Visit: Payer: Self-pay | Admitting: *Deleted

## 2016-09-29 DIAGNOSIS — E46 Unspecified protein-calorie malnutrition: Secondary | ICD-10-CM | POA: Diagnosis not present

## 2016-09-29 DIAGNOSIS — C774 Secondary and unspecified malignant neoplasm of inguinal and lower limb lymph nodes: Secondary | ICD-10-CM | POA: Diagnosis not present

## 2016-09-29 DIAGNOSIS — N185 Chronic kidney disease, stage 5: Secondary | ICD-10-CM | POA: Diagnosis not present

## 2016-09-29 DIAGNOSIS — C519 Malignant neoplasm of vulva, unspecified: Secondary | ICD-10-CM | POA: Diagnosis not present

## 2016-09-29 DIAGNOSIS — I10 Essential (primary) hypertension: Secondary | ICD-10-CM | POA: Diagnosis not present

## 2016-09-29 DIAGNOSIS — I509 Heart failure, unspecified: Secondary | ICD-10-CM | POA: Diagnosis not present

## 2016-09-29 MED ORDER — HYDRALAZINE HCL 25 MG PO TABS: 25.0000 mg | ORAL_TABLET | Freq: Three times a day (TID) | ORAL | Status: DC

## 2016-10-03 DIAGNOSIS — C774 Secondary and unspecified malignant neoplasm of inguinal and lower limb lymph nodes: Secondary | ICD-10-CM | POA: Diagnosis not present

## 2016-10-03 DIAGNOSIS — I509 Heart failure, unspecified: Secondary | ICD-10-CM | POA: Diagnosis not present

## 2016-10-03 DIAGNOSIS — N185 Chronic kidney disease, stage 5: Secondary | ICD-10-CM | POA: Diagnosis not present

## 2016-10-03 DIAGNOSIS — I10 Essential (primary) hypertension: Secondary | ICD-10-CM | POA: Diagnosis not present

## 2016-10-03 DIAGNOSIS — C519 Malignant neoplasm of vulva, unspecified: Secondary | ICD-10-CM | POA: Diagnosis not present

## 2016-10-03 DIAGNOSIS — E46 Unspecified protein-calorie malnutrition: Secondary | ICD-10-CM | POA: Diagnosis not present

## 2016-10-06 DIAGNOSIS — N185 Chronic kidney disease, stage 5: Secondary | ICD-10-CM | POA: Diagnosis not present

## 2016-10-06 DIAGNOSIS — I10 Essential (primary) hypertension: Secondary | ICD-10-CM | POA: Diagnosis not present

## 2016-10-06 DIAGNOSIS — E46 Unspecified protein-calorie malnutrition: Secondary | ICD-10-CM | POA: Diagnosis not present

## 2016-10-06 DIAGNOSIS — C519 Malignant neoplasm of vulva, unspecified: Secondary | ICD-10-CM | POA: Diagnosis not present

## 2016-10-06 DIAGNOSIS — I509 Heart failure, unspecified: Secondary | ICD-10-CM | POA: Diagnosis not present

## 2016-10-06 DIAGNOSIS — C774 Secondary and unspecified malignant neoplasm of inguinal and lower limb lymph nodes: Secondary | ICD-10-CM | POA: Diagnosis not present

## 2016-10-11 NOTE — Telephone Encounter (Signed)
Pt needs a refill on Hydralazine HCI, pt only has a couple left. Pt uses [facility] at [address]. ep

## 2016-10-11 NOTE — Addendum Note (Signed)
Addended by: Velora Heckler on: 10/11/2016 01:28 PM   Modules accepted: Orders

## 2016-10-12 MED ORDER — HYDRALAZINE HCL 25 MG PO TABS: 25.0000 mg | ORAL_TABLET | Freq: Three times a day (TID) | ORAL | Status: AC

## 2016-10-13 ENCOUNTER — Telehealth: Payer: Self-pay | Admitting: Family Medicine

## 2016-10-13 DIAGNOSIS — C519 Malignant neoplasm of vulva, unspecified: Secondary | ICD-10-CM | POA: Diagnosis not present

## 2016-10-13 DIAGNOSIS — N185 Chronic kidney disease, stage 5: Secondary | ICD-10-CM | POA: Diagnosis not present

## 2016-10-13 DIAGNOSIS — I10 Essential (primary) hypertension: Secondary | ICD-10-CM | POA: Diagnosis not present

## 2016-10-13 DIAGNOSIS — I509 Heart failure, unspecified: Secondary | ICD-10-CM | POA: Diagnosis not present

## 2016-10-13 DIAGNOSIS — C774 Secondary and unspecified malignant neoplasm of inguinal and lower limb lymph nodes: Secondary | ICD-10-CM | POA: Diagnosis not present

## 2016-10-13 DIAGNOSIS — E46 Unspecified protein-calorie malnutrition: Secondary | ICD-10-CM | POA: Diagnosis not present

## 2016-10-13 NOTE — Telephone Encounter (Signed)
EMERGENCY CALL LINE:  Contacted by Mrs. Jaffer's hospice nurse concerning hydralazine. States pharmacy did not receive hydralazine prescription. Chart review showed hydralazine was refilled by Dr. Nori Riis on 10/12/16. Contacted pharmacy and they did not fill prescription initially because last year patient was on Hydralazine 75mg  three times daily and were unsure if she was supposed to be on 25mg  three times daily dosing. Chart review shows dose was lowered to 25mg  from 75mg . Pharmacy instructed to fill Dr. Verlon Au prescription as ordered. Mrs. Gundy to schedule follow up appointment to have blood pressure checked on this dose.  Dr. Gerlean Ren

## 2016-10-16 DIAGNOSIS — C519 Malignant neoplasm of vulva, unspecified: Secondary | ICD-10-CM | POA: Diagnosis not present

## 2016-10-16 DIAGNOSIS — I509 Heart failure, unspecified: Secondary | ICD-10-CM | POA: Diagnosis not present

## 2016-10-16 DIAGNOSIS — K219 Gastro-esophageal reflux disease without esophagitis: Secondary | ICD-10-CM | POA: Diagnosis not present

## 2016-10-16 DIAGNOSIS — N185 Chronic kidney disease, stage 5: Secondary | ICD-10-CM | POA: Diagnosis not present

## 2016-10-16 DIAGNOSIS — C774 Secondary and unspecified malignant neoplasm of inguinal and lower limb lymph nodes: Secondary | ICD-10-CM | POA: Diagnosis not present

## 2016-10-16 DIAGNOSIS — I10 Essential (primary) hypertension: Secondary | ICD-10-CM | POA: Diagnosis not present

## 2016-10-16 DIAGNOSIS — E785 Hyperlipidemia, unspecified: Secondary | ICD-10-CM | POA: Diagnosis not present

## 2016-10-16 DIAGNOSIS — E46 Unspecified protein-calorie malnutrition: Secondary | ICD-10-CM | POA: Diagnosis not present

## 2016-10-16 DIAGNOSIS — J449 Chronic obstructive pulmonary disease, unspecified: Secondary | ICD-10-CM | POA: Diagnosis not present

## 2016-10-21 DIAGNOSIS — E46 Unspecified protein-calorie malnutrition: Secondary | ICD-10-CM | POA: Diagnosis not present

## 2016-10-21 DIAGNOSIS — C774 Secondary and unspecified malignant neoplasm of inguinal and lower limb lymph nodes: Secondary | ICD-10-CM | POA: Diagnosis not present

## 2016-10-21 DIAGNOSIS — N185 Chronic kidney disease, stage 5: Secondary | ICD-10-CM | POA: Diagnosis not present

## 2016-10-21 DIAGNOSIS — C519 Malignant neoplasm of vulva, unspecified: Secondary | ICD-10-CM | POA: Diagnosis not present

## 2016-10-21 DIAGNOSIS — I509 Heart failure, unspecified: Secondary | ICD-10-CM | POA: Diagnosis not present

## 2016-10-21 DIAGNOSIS — I10 Essential (primary) hypertension: Secondary | ICD-10-CM | POA: Diagnosis not present

## 2016-10-24 DIAGNOSIS — I10 Essential (primary) hypertension: Secondary | ICD-10-CM | POA: Diagnosis not present

## 2016-10-24 DIAGNOSIS — C519 Malignant neoplasm of vulva, unspecified: Secondary | ICD-10-CM | POA: Diagnosis not present

## 2016-10-24 DIAGNOSIS — N185 Chronic kidney disease, stage 5: Secondary | ICD-10-CM | POA: Diagnosis not present

## 2016-10-24 DIAGNOSIS — E46 Unspecified protein-calorie malnutrition: Secondary | ICD-10-CM | POA: Diagnosis not present

## 2016-10-24 DIAGNOSIS — I509 Heart failure, unspecified: Secondary | ICD-10-CM | POA: Diagnosis not present

## 2016-10-24 DIAGNOSIS — C774 Secondary and unspecified malignant neoplasm of inguinal and lower limb lymph nodes: Secondary | ICD-10-CM | POA: Diagnosis not present

## 2016-10-25 DIAGNOSIS — I10 Essential (primary) hypertension: Secondary | ICD-10-CM | POA: Diagnosis not present

## 2016-10-25 DIAGNOSIS — C774 Secondary and unspecified malignant neoplasm of inguinal and lower limb lymph nodes: Secondary | ICD-10-CM | POA: Diagnosis not present

## 2016-10-25 DIAGNOSIS — C519 Malignant neoplasm of vulva, unspecified: Secondary | ICD-10-CM | POA: Diagnosis not present

## 2016-10-25 DIAGNOSIS — E46 Unspecified protein-calorie malnutrition: Secondary | ICD-10-CM | POA: Diagnosis not present

## 2016-10-25 DIAGNOSIS — N185 Chronic kidney disease, stage 5: Secondary | ICD-10-CM | POA: Diagnosis not present

## 2016-10-25 DIAGNOSIS — I509 Heart failure, unspecified: Secondary | ICD-10-CM | POA: Diagnosis not present

## 2016-10-28 DIAGNOSIS — N185 Chronic kidney disease, stage 5: Secondary | ICD-10-CM | POA: Diagnosis not present

## 2016-10-28 DIAGNOSIS — I509 Heart failure, unspecified: Secondary | ICD-10-CM | POA: Diagnosis not present

## 2016-10-28 DIAGNOSIS — E46 Unspecified protein-calorie malnutrition: Secondary | ICD-10-CM | POA: Diagnosis not present

## 2016-10-28 DIAGNOSIS — C774 Secondary and unspecified malignant neoplasm of inguinal and lower limb lymph nodes: Secondary | ICD-10-CM | POA: Diagnosis not present

## 2016-10-28 DIAGNOSIS — I10 Essential (primary) hypertension: Secondary | ICD-10-CM | POA: Diagnosis not present

## 2016-10-28 DIAGNOSIS — C519 Malignant neoplasm of vulva, unspecified: Secondary | ICD-10-CM | POA: Diagnosis not present

## 2016-10-31 ENCOUNTER — Telehealth: Payer: Self-pay | Admitting: *Deleted

## 2016-10-31 DIAGNOSIS — N185 Chronic kidney disease, stage 5: Secondary | ICD-10-CM | POA: Diagnosis not present

## 2016-10-31 DIAGNOSIS — C774 Secondary and unspecified malignant neoplasm of inguinal and lower limb lymph nodes: Secondary | ICD-10-CM | POA: Diagnosis not present

## 2016-10-31 DIAGNOSIS — I509 Heart failure, unspecified: Secondary | ICD-10-CM | POA: Diagnosis not present

## 2016-10-31 DIAGNOSIS — C519 Malignant neoplasm of vulva, unspecified: Secondary | ICD-10-CM | POA: Diagnosis not present

## 2016-10-31 DIAGNOSIS — I10 Essential (primary) hypertension: Secondary | ICD-10-CM | POA: Diagnosis not present

## 2016-10-31 DIAGNOSIS — E46 Unspecified protein-calorie malnutrition: Secondary | ICD-10-CM | POA: Diagnosis not present

## 2016-10-31 NOTE — Telephone Encounter (Signed)
Pts daughter calls because there is furosemide in her mothers pill box, but she is sure that Dr.Neal stopped this in December.   I do not see this med on her med list but it is unclear if it was stopped in December.  Will forward to MD for clarification. [place], Salome Spotted, CMA

## 2016-11-01 DIAGNOSIS — I509 Heart failure, unspecified: Secondary | ICD-10-CM | POA: Diagnosis not present

## 2016-11-01 DIAGNOSIS — I10 Essential (primary) hypertension: Secondary | ICD-10-CM | POA: Diagnosis not present

## 2016-11-01 DIAGNOSIS — C774 Secondary and unspecified malignant neoplasm of inguinal and lower limb lymph nodes: Secondary | ICD-10-CM | POA: Diagnosis not present

## 2016-11-01 DIAGNOSIS — E46 Unspecified protein-calorie malnutrition: Secondary | ICD-10-CM | POA: Diagnosis not present

## 2016-11-01 DIAGNOSIS — N185 Chronic kidney disease, stage 5: Secondary | ICD-10-CM | POA: Diagnosis not present

## 2016-11-01 DIAGNOSIS — C519 Malignant neoplasm of vulva, unspecified: Secondary | ICD-10-CM | POA: Diagnosis not present

## 2016-11-01 NOTE — Telephone Encounter (Signed)
Left message with a gentleman to have Pt daughter to call office back to give her the below information. Please inform them of below if they return call. Katharina Caper, Izell Labat D, [place]

## 2016-11-01 NOTE — Telephone Encounter (Signed)
Dear [name] Team Yes we discontinued it. I have searched chart--I cannot see where we re-ordered it. I wondered if it was on auto refill but I do not see where we made a mistake like that. It was last ordered by Dr at [facility] some confusion, but yes, stop taking it.  Now here is the question--if she HAS been taking it and doing well--is it part of the reason she is doing well????  So I recommend taking it out of pill box and if there is a pill bottle full of furosemide, put it in a back corner of the the closet but do not throw it away just yet. If she starts getting SOB or puffy, call me back . THANKS! [name]

## 2016-11-02 NOTE — Telephone Encounter (Signed)
Daughter returned call, I advised her of the message from Dr. Nori Riis.   Furosemide has been discontinued and taken out of pt's pill box. Patient will follow up with Dr. Nori Riis about this issue next week 4/25 at her appointment.

## 2016-11-07 DIAGNOSIS — I509 Heart failure, unspecified: Secondary | ICD-10-CM | POA: Diagnosis not present

## 2016-11-07 DIAGNOSIS — N185 Chronic kidney disease, stage 5: Secondary | ICD-10-CM | POA: Diagnosis not present

## 2016-11-07 DIAGNOSIS — E46 Unspecified protein-calorie malnutrition: Secondary | ICD-10-CM | POA: Diagnosis not present

## 2016-11-07 DIAGNOSIS — C774 Secondary and unspecified malignant neoplasm of inguinal and lower limb lymph nodes: Secondary | ICD-10-CM | POA: Diagnosis not present

## 2016-11-07 DIAGNOSIS — I10 Essential (primary) hypertension: Secondary | ICD-10-CM | POA: Diagnosis not present

## 2016-11-07 DIAGNOSIS — C519 Malignant neoplasm of vulva, unspecified: Secondary | ICD-10-CM | POA: Diagnosis not present

## 2016-11-09 ENCOUNTER — Ambulatory Visit (INDEPENDENT_AMBULATORY_CARE_PROVIDER_SITE_OTHER): Payer: Medicare Other | Admitting: Family Medicine

## 2016-11-09 ENCOUNTER — Encounter: Payer: Self-pay | Admitting: Family Medicine

## 2016-11-09 DIAGNOSIS — N184 Chronic kidney disease, stage 4 (severe): Secondary | ICD-10-CM | POA: Diagnosis not present

## 2016-11-09 DIAGNOSIS — I129 Hypertensive chronic kidney disease with stage 1 through stage 4 chronic kidney disease, or unspecified chronic kidney disease: Secondary | ICD-10-CM

## 2016-11-09 DIAGNOSIS — C519 Malignant neoplasm of vulva, unspecified: Secondary | ICD-10-CM

## 2016-11-09 DIAGNOSIS — I5032 Chronic diastolic (congestive) heart failure: Secondary | ICD-10-CM | POA: Diagnosis not present

## 2016-11-10 NOTE — Progress Notes (Signed)
    CHIEF COMPLAINT / HPI:   Here with her daughter for follow-up. Their main questions about medications, specifically furosemide. She was on that when she was in the nursing home and somehow got put back in her pill minder as a daily medicine. Her daughter had called about that. Sounds like if she went off it totally, she got a little fluid overloaded after about 4 days. She taken for couple days and then has been fine for the last 3 days. #2. Appetite is baseline. She's not had any weight loss. She occasionally gets some nausea but says she just uses a little bit of Pepsi and that makes her feel much better. #3. Social: She continues to live alone with a lot of oversight from her daughter. She continues to enjoy life, especially enjoying her cat named PETE!  REVIEW OF SYSTEMS:  See history of present illness. Additional pertinent review of systems is negative for fever, no abdominal pain, denies any pelvic pain. Sleeping well. Energy level is baseline. Her daughter says she's a little bit forgetful  OBJECTIVE:  Vital signs are reviewed.   GEN.: Thin female, no acute distress. She looks quite happy. PSYCHIATRIC: Was and demeanor. Asks and answers questions appropriately area she gets very happy when she's talking about her cat has the biggest smile on her face during his conversation. MSK: She rises from a chair with no assistance. She's frail when trying to walk without any assistance however require someone to lean on his her balance is poor. CV: Regular rate and rhythm. No murmurs heard. LUNGS: Clear to auscultation bilaterally. No rales. EXTREMITY: There is no edema notable in her ankles or hands.  ASSESSMENT / PLAN: Please see problem oriented charting for details

## 2016-11-10 NOTE — Assessment & Plan Note (Signed)
She seems to pretty well controlled right now somewhat make any medication changes.

## 2016-11-10 NOTE — Assessment & Plan Note (Signed)
I think we'll change her furosemide to 40 mg a day Monday Wednesday Friday. She and her daughter can adjust this a little bit if they need to taking more often if they need to work and skip an occasional day based on fluid overload. I would leave this mostly to her daughter to adjust any dosing otherwise I would put in her pillbox 3 times a week.

## 2016-11-15 DIAGNOSIS — E46 Unspecified protein-calorie malnutrition: Secondary | ICD-10-CM | POA: Diagnosis not present

## 2016-11-15 DIAGNOSIS — C519 Malignant neoplasm of vulva, unspecified: Secondary | ICD-10-CM | POA: Diagnosis not present

## 2016-11-15 DIAGNOSIS — E785 Hyperlipidemia, unspecified: Secondary | ICD-10-CM | POA: Diagnosis not present

## 2016-11-15 DIAGNOSIS — N185 Chronic kidney disease, stage 5: Secondary | ICD-10-CM | POA: Diagnosis not present

## 2016-11-15 DIAGNOSIS — C774 Secondary and unspecified malignant neoplasm of inguinal and lower limb lymph nodes: Secondary | ICD-10-CM | POA: Diagnosis not present

## 2016-11-15 DIAGNOSIS — J449 Chronic obstructive pulmonary disease, unspecified: Secondary | ICD-10-CM | POA: Diagnosis not present

## 2016-11-15 DIAGNOSIS — I10 Essential (primary) hypertension: Secondary | ICD-10-CM | POA: Diagnosis not present

## 2016-11-15 DIAGNOSIS — K219 Gastro-esophageal reflux disease without esophagitis: Secondary | ICD-10-CM | POA: Diagnosis not present

## 2016-11-15 DIAGNOSIS — I509 Heart failure, unspecified: Secondary | ICD-10-CM | POA: Diagnosis not present

## 2016-11-17 DIAGNOSIS — N185 Chronic kidney disease, stage 5: Secondary | ICD-10-CM | POA: Diagnosis not present

## 2016-11-17 DIAGNOSIS — E46 Unspecified protein-calorie malnutrition: Secondary | ICD-10-CM | POA: Diagnosis not present

## 2016-11-17 DIAGNOSIS — I509 Heart failure, unspecified: Secondary | ICD-10-CM | POA: Diagnosis not present

## 2016-11-17 DIAGNOSIS — C519 Malignant neoplasm of vulva, unspecified: Secondary | ICD-10-CM | POA: Diagnosis not present

## 2016-11-17 DIAGNOSIS — I10 Essential (primary) hypertension: Secondary | ICD-10-CM | POA: Diagnosis not present

## 2016-11-17 DIAGNOSIS — C774 Secondary and unspecified malignant neoplasm of inguinal and lower limb lymph nodes: Secondary | ICD-10-CM | POA: Diagnosis not present

## 2016-11-23 DIAGNOSIS — E46 Unspecified protein-calorie malnutrition: Secondary | ICD-10-CM | POA: Diagnosis not present

## 2016-11-23 DIAGNOSIS — N185 Chronic kidney disease, stage 5: Secondary | ICD-10-CM | POA: Diagnosis not present

## 2016-11-23 DIAGNOSIS — I509 Heart failure, unspecified: Secondary | ICD-10-CM | POA: Diagnosis not present

## 2016-11-23 DIAGNOSIS — C519 Malignant neoplasm of vulva, unspecified: Secondary | ICD-10-CM | POA: Diagnosis not present

## 2016-11-23 DIAGNOSIS — C774 Secondary and unspecified malignant neoplasm of inguinal and lower limb lymph nodes: Secondary | ICD-10-CM | POA: Diagnosis not present

## 2016-11-23 DIAGNOSIS — I10 Essential (primary) hypertension: Secondary | ICD-10-CM | POA: Diagnosis not present

## 2016-11-24 ENCOUNTER — Other Ambulatory Visit: Payer: Self-pay | Admitting: Family Medicine

## 2016-11-25 DIAGNOSIS — I10 Essential (primary) hypertension: Secondary | ICD-10-CM | POA: Diagnosis not present

## 2016-11-25 DIAGNOSIS — C519 Malignant neoplasm of vulva, unspecified: Secondary | ICD-10-CM | POA: Diagnosis not present

## 2016-11-25 DIAGNOSIS — N185 Chronic kidney disease, stage 5: Secondary | ICD-10-CM | POA: Diagnosis not present

## 2016-11-25 DIAGNOSIS — C774 Secondary and unspecified malignant neoplasm of inguinal and lower limb lymph nodes: Secondary | ICD-10-CM | POA: Diagnosis not present

## 2016-11-25 DIAGNOSIS — E46 Unspecified protein-calorie malnutrition: Secondary | ICD-10-CM | POA: Diagnosis not present

## 2016-11-25 DIAGNOSIS — I509 Heart failure, unspecified: Secondary | ICD-10-CM | POA: Diagnosis not present

## 2016-11-25 NOTE — Telephone Encounter (Signed)
Vickie, hospice nurse called for clarification on lasix dosage.  Rx and visit notes were read to her. She says this is not how it is written at the pharmacy and suggested clarification at the pharmacy. She can be reached at [phone].  If she doesn't answer,  pleae call hospice main number [phone]

## 2016-11-28 DIAGNOSIS — C774 Secondary and unspecified malignant neoplasm of inguinal and lower limb lymph nodes: Secondary | ICD-10-CM | POA: Diagnosis not present

## 2016-11-28 DIAGNOSIS — C519 Malignant neoplasm of vulva, unspecified: Secondary | ICD-10-CM | POA: Diagnosis not present

## 2016-11-28 DIAGNOSIS — N185 Chronic kidney disease, stage 5: Secondary | ICD-10-CM | POA: Diagnosis not present

## 2016-11-28 DIAGNOSIS — I509 Heart failure, unspecified: Secondary | ICD-10-CM | POA: Diagnosis not present

## 2016-11-28 DIAGNOSIS — I10 Essential (primary) hypertension: Secondary | ICD-10-CM | POA: Diagnosis not present

## 2016-11-28 DIAGNOSIS — E46 Unspecified protein-calorie malnutrition: Secondary | ICD-10-CM | POA: Diagnosis not present

## 2016-11-28 NOTE — Telephone Encounter (Signed)
Ms Lothrop 's daughter Tomasita Morrow calledstating that her mother began bleeding 3-4 days ago.  It is like a period.  Pt has passed some clots.  She is changing her pads twice a day. Told her that with the recurrent vulvar cancer that this is expected. Dr. Denman George recommends calling Dr. Clabe Seal office to get an appointment for evaluation for palliative radiation. Dtr. Will call Dr. Lois Huxley office to schedule appointment.

## 2016-12-01 DIAGNOSIS — I509 Heart failure, unspecified: Secondary | ICD-10-CM | POA: Diagnosis not present

## 2016-12-01 DIAGNOSIS — I10 Essential (primary) hypertension: Secondary | ICD-10-CM | POA: Diagnosis not present

## 2016-12-01 DIAGNOSIS — E46 Unspecified protein-calorie malnutrition: Secondary | ICD-10-CM | POA: Diagnosis not present

## 2016-12-01 DIAGNOSIS — N185 Chronic kidney disease, stage 5: Secondary | ICD-10-CM | POA: Diagnosis not present

## 2016-12-01 DIAGNOSIS — C774 Secondary and unspecified malignant neoplasm of inguinal and lower limb lymph nodes: Secondary | ICD-10-CM | POA: Diagnosis not present

## 2016-12-01 DIAGNOSIS — C519 Malignant neoplasm of vulva, unspecified: Secondary | ICD-10-CM | POA: Diagnosis not present

## 2016-12-01 MED ORDER — FUROSEMIDE 40 MG PO TABS: ORAL_TABLET | ORAL | 3 refills | Status: AC

## 2016-12-01 NOTE — Telephone Encounter (Signed)
Spoke with Vickie and clarified.

## 2016-12-01 NOTE — Telephone Encounter (Signed)
Looks like this message was attached to a refill request so routing to PCP. Katharina Caper, April D, [place]

## 2016-12-01 NOTE — Addendum Note (Signed)
Addended by: Zenia Resides on: 12/01/2016 01:50 PM   Modules accepted: Orders

## 2016-12-02 DIAGNOSIS — I509 Heart failure, unspecified: Secondary | ICD-10-CM | POA: Diagnosis not present

## 2016-12-02 DIAGNOSIS — I10 Essential (primary) hypertension: Secondary | ICD-10-CM | POA: Diagnosis not present

## 2016-12-02 DIAGNOSIS — C519 Malignant neoplasm of vulva, unspecified: Secondary | ICD-10-CM | POA: Diagnosis not present

## 2016-12-02 DIAGNOSIS — E46 Unspecified protein-calorie malnutrition: Secondary | ICD-10-CM | POA: Diagnosis not present

## 2016-12-02 DIAGNOSIS — N185 Chronic kidney disease, stage 5: Secondary | ICD-10-CM | POA: Diagnosis not present

## 2016-12-02 DIAGNOSIS — C774 Secondary and unspecified malignant neoplasm of inguinal and lower limb lymph nodes: Secondary | ICD-10-CM | POA: Diagnosis not present

## 2016-12-06 ENCOUNTER — Other Ambulatory Visit: Payer: Self-pay | Admitting: Family Medicine

## 2016-12-06 ENCOUNTER — Telehealth: Payer: Self-pay | Admitting: Oncology

## 2016-12-06 NOTE — Telephone Encounter (Signed)
Patient's daughter, [name], called and wanted to make a follow up appointment with Dr. Sondra Come.  She said her mother has had vaginal bleeding for 3-4 days and also said she say a blood clot.  Rickie said she called Dr. Serita Grit office and was told to call Dr. Sondra Come to see if he would be able to do more radiation.  She also said Vercie has been under hospice care for the last 6 months and has been doing well until the bleeding.  Advised her that we will call her back with an appointment time.

## 2016-12-07 ENCOUNTER — Encounter: Payer: Self-pay | Admitting: Radiation Oncology

## 2016-12-07 DIAGNOSIS — C519 Malignant neoplasm of vulva, unspecified: Secondary | ICD-10-CM | POA: Diagnosis not present

## 2016-12-07 DIAGNOSIS — C774 Secondary and unspecified malignant neoplasm of inguinal and lower limb lymph nodes: Secondary | ICD-10-CM | POA: Diagnosis not present

## 2016-12-07 DIAGNOSIS — E46 Unspecified protein-calorie malnutrition: Secondary | ICD-10-CM | POA: Diagnosis not present

## 2016-12-07 DIAGNOSIS — I509 Heart failure, unspecified: Secondary | ICD-10-CM | POA: Diagnosis not present

## 2016-12-07 DIAGNOSIS — I10 Essential (primary) hypertension: Secondary | ICD-10-CM | POA: Diagnosis not present

## 2016-12-07 DIAGNOSIS — N185 Chronic kidney disease, stage 5: Secondary | ICD-10-CM | POA: Diagnosis not present

## 2016-12-08 ENCOUNTER — Ambulatory Visit: Payer: Medicare Other

## 2016-12-08 ENCOUNTER — Ambulatory Visit: Payer: Medicare Other | Admitting: Radiation Oncology

## 2016-12-08 ENCOUNTER — Telehealth: Payer: Self-pay | Admitting: *Deleted

## 2016-12-08 DIAGNOSIS — N185 Chronic kidney disease, stage 5: Secondary | ICD-10-CM | POA: Diagnosis not present

## 2016-12-08 DIAGNOSIS — C774 Secondary and unspecified malignant neoplasm of inguinal and lower limb lymph nodes: Secondary | ICD-10-CM | POA: Diagnosis not present

## 2016-12-08 DIAGNOSIS — E46 Unspecified protein-calorie malnutrition: Secondary | ICD-10-CM | POA: Diagnosis not present

## 2016-12-08 DIAGNOSIS — C519 Malignant neoplasm of vulva, unspecified: Secondary | ICD-10-CM | POA: Diagnosis not present

## 2016-12-08 DIAGNOSIS — I10 Essential (primary) hypertension: Secondary | ICD-10-CM | POA: Diagnosis not present

## 2016-12-08 DIAGNOSIS — I509 Heart failure, unspecified: Secondary | ICD-10-CM | POA: Diagnosis not present

## 2016-12-08 NOTE — Telephone Encounter (Signed)
Prior Authorization received from [facility] for Verapamil 240 mg SR. PA form placed in provider box for completion. Derl Barrow, RN

## 2016-12-09 DIAGNOSIS — E46 Unspecified protein-calorie malnutrition: Secondary | ICD-10-CM | POA: Diagnosis not present

## 2016-12-09 DIAGNOSIS — I509 Heart failure, unspecified: Secondary | ICD-10-CM | POA: Diagnosis not present

## 2016-12-09 DIAGNOSIS — N185 Chronic kidney disease, stage 5: Secondary | ICD-10-CM | POA: Diagnosis not present

## 2016-12-09 DIAGNOSIS — C774 Secondary and unspecified malignant neoplasm of inguinal and lower limb lymph nodes: Secondary | ICD-10-CM | POA: Diagnosis not present

## 2016-12-09 DIAGNOSIS — C519 Malignant neoplasm of vulva, unspecified: Secondary | ICD-10-CM | POA: Diagnosis not present

## 2016-12-09 DIAGNOSIS — I10 Essential (primary) hypertension: Secondary | ICD-10-CM | POA: Diagnosis not present

## 2016-12-09 NOTE — Telephone Encounter (Signed)
This is on your desk I feel like we just did this a [place]! [name]

## 2016-12-13 NOTE — Telephone Encounter (Signed)
PA faxed to [facility] for review.  Derl Barrow, RN

## 2016-12-14 ENCOUNTER — Ambulatory Visit
Admission: RE | Admit: 2016-12-14 | Discharge: 2016-12-14 | Disposition: A | Discharge status: Home / Self Care | Source: Ambulatory Visit | Attending: Radiation Oncology | Admitting: Radiation Oncology

## 2016-12-14 ENCOUNTER — Encounter: Payer: Self-pay | Admitting: Radiation Oncology

## 2016-12-14 DIAGNOSIS — C518 Malignant neoplasm of overlapping sites of vulva: Secondary | ICD-10-CM | POA: Diagnosis not present

## 2016-12-14 DIAGNOSIS — Z923 Personal history of irradiation: Secondary | ICD-10-CM | POA: Insufficient documentation

## 2016-12-14 DIAGNOSIS — Z79899 Other long term (current) drug therapy: Secondary | ICD-10-CM | POA: Diagnosis not present

## 2016-12-14 DIAGNOSIS — Z885 Allergy status to narcotic agent status: Secondary | ICD-10-CM | POA: Diagnosis not present

## 2016-12-14 DIAGNOSIS — E46 Unspecified protein-calorie malnutrition: Secondary | ICD-10-CM | POA: Diagnosis not present

## 2016-12-14 DIAGNOSIS — C774 Secondary and unspecified malignant neoplasm of inguinal and lower limb lymph nodes: Secondary | ICD-10-CM | POA: Diagnosis not present

## 2016-12-14 DIAGNOSIS — C519 Malignant neoplasm of vulva, unspecified: Secondary | ICD-10-CM

## 2016-12-14 DIAGNOSIS — N185 Chronic kidney disease, stage 5: Secondary | ICD-10-CM | POA: Diagnosis not present

## 2016-12-14 DIAGNOSIS — I509 Heart failure, unspecified: Secondary | ICD-10-CM | POA: Diagnosis not present

## 2016-12-14 DIAGNOSIS — Z510 Encounter for antineoplastic radiation therapy: Secondary | ICD-10-CM | POA: Insufficient documentation

## 2016-12-14 DIAGNOSIS — N939 Abnormal uterine and vaginal bleeding, unspecified: Secondary | ICD-10-CM | POA: Diagnosis not present

## 2016-12-14 DIAGNOSIS — I10 Essential (primary) hypertension: Secondary | ICD-10-CM | POA: Diagnosis not present

## 2016-12-14 DIAGNOSIS — Z880 Allergy status to penicillin: Secondary | ICD-10-CM | POA: Diagnosis not present

## 2016-12-14 NOTE — Progress Notes (Addendum)
GYN Location of Tumor / Histology: Stage II vulvar cancer (anterior) involving distal urethra, vagina and anterior labia minora  Weight changes, if any: daughter states patient has lost about 4 lbs in last 2 months.  Bowel/Bladder complaints, if any: No.,   Nausea/Vomiting, if any: no  Pain issues, if any:  no  SAFETY ISSUES:  Prior radiation? 04/02/15-05/29/15 vulvar region, inguinal lymph node 60 Gy  Pacemaker/ICD? no  Possible current pregnancy? no  Is the patient on methotrexate? no  Current Complaints / other details: Patient reports having vaginal bleeding for 3-5 days like a period.  This happened 2 weeks ago.  She also reported having some clots.  Patient has been under hospice care for 7 months.  BP (!) 159/67 (BP Location: Right Arm, Patient Position: Sitting)   Pulse 69   Temp 98.3 F (36.8 C) (Oral)   Ht 5\' 2"  (1.575 m)   Wt 100 lb 3.2 oz (45.5 kg)   SpO2 98%   BMI 18.33 kg/m    Wt Readings from Last 3 Encounters:  12/14/16 100 lb 3.2 oz (45.5 kg)  11/09/16 101 lb 9.6 oz (46.1 kg)  06/29/16 100 lb 3.2 oz (45.5 kg)

## 2016-12-14 NOTE — Progress Notes (Signed)
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 The patient and her daughter elected not to pursue additional aggressive regiments and she has been under hospice care for the last 7 months. The patient's daughter called Dr. Serita Grit office in mid May due to a 3-4 day history of vaginal bleeding. She was instructed to call our office to see if additional palliative radiation could be performed. The patient is joined by her daughter today.  On review of systems, the patient denies nausea and vomiting, as well as bowel or bladder complaints. She denies pain. She denies breathing difficulty. Her daughter notes the patient has lost approximately 4 lbs in the last 2 months. The patient reports her appetite is somewhat decreased. The patient confirms vaginal bleeding for 3-5 days approximately 2 weeks ago. She also noted some clots at that time. This has stopped now.  ALLERGIES:  is allergic to codeine and penicillins.  Meds: Current Outpatient Prescriptions  Medication Sig Dispense Refill  . Cholecalciferol (VITAMIN D3) 3000 units TABS Take 3,000 Units by mouth daily.     . furosemide (LASIX) 40 MG tablet Take one tab by mouth three times a week MWF as needed for fluid overload 30 tablet 3  . hydrALAZINE (APRESOLINE) 25 MG tablet TAKE 1 TABLET(25 MG) BY MOUTH THREE TIMES DAILY 270 tablet 0  . loratadine (CLARITIN) 10 MG tablet Take 1 tablet (10 mg total) by mouth daily. 90 tablet 3  . omeprazole  (PRILOSEC) 20 MG capsule Take 1 capsule (20 mg total) by mouth daily. 90 capsule 3  . traMADol (ULTRAM) 50 MG tablet Take one tablet by mouth every eight hours for pain 90 tablet 5  . verapamil (VERELAN PM) 240 MG 24 hr capsule TAKE 1 CAPSULE BY MOUTH DAILY 90 capsule 3  . vitamin E 400 UNIT capsule Take by mouth.    . hydrALAZINE (APRESOLINE) 25 MG tablet Take 1 tablet (25 mg total) by mouth 3 (three) times daily. (Patient not taking: Reported on 12/14/2016)    . verapamil (VERELAN PM) 240 MG 24 hr capsule TAKE 1 CAPSULE BY MOUTH DAILY (Patient not taking: Reported on 12/14/2016) 90 capsule 0   No current facility-administered medications for this encounter.     Physical Findings: The patient is in no acute distress. Patient is alert and oriented.  height is 5\' 2"  (1.575 m) and weight is 100 lb 3.2 oz (45.5 kg). Her oral temperature is 98.3 F (36.8 C). Her blood pressure is 159/67 (abnormal) and her pulse is 69. Her oxygen saturation is 98%.  General: Alert and oriented, in no acute distress. Heart: Regular in rate and rhythm with no murmurs. Chest: Clear to auscultation bilaterally. Neurologic: No obvious focalities. Speech is fluent. Coordination is intact. Psychiatric: Judgment and insight are intact. Affect is appropriate.  Exam of right inguinal area reveals large firm 7 x 10 cm mass consistent with tumor recurrence. Some erythema but no obvious skin breakthrough of tumor. Examination of perineum reveals prior surgical changes,  some erythema but no obvious signs of recurrence. A speculum exam is performed with a small speculum, showing RT changes noted in distal vagina but no obvious recurrence along the vaginal vault or vulvar area. No reason to explain patient's recent episode of vaginal bleeding.   Lab Findings: Lab Results  Component Value Date   WBC 6.7 06/21/2016   HGB 11.0 (L) 06/21/2016   HCT 34.2 (L) 06/21/2016   MCV 88.1 06/21/2016   PLT 238 06/21/2016    Radiographic  Findings: No results found.  Impression:  Recurrent stage II vulvar cancer (anterior) involving distal urethra, vagina, and anterior labia minora.  The patient now appears to have a large recurrence in the right inguinal area. This area was not previously treated with HDR therapy in 2016. The patient would be a candidate for a short course of palliative radiation therapy, but the patient does not appear to be symptomatic from this mass despite its large size. In fact the patient had thought her hernia had recurred in this area.  The patient is unsure if she would like to proceed with this treatment. She will discuss this with her daughter, and if she wishes t proceed with treatment will call back to set up RT. I discussed the course of RT, potential side effects and toxicities. I also discussed what may happen if she does not have any treatment directed at the right inguinal mass.  Plan: The patient will take some time to consider her options. I encouraged her to think over what we discussed, and to contact the clinic when she decides how to proceed.   I will copy Dr. Denman George on my note today.  ____________________________________  Blair Promise, PhD, MD  This document serves as a record of services personally performed by Gery Pray, MD. It was created on his behalf by Maryla Morrow, a trained medical scribe. The creation of this record is based on the scribe's personal observations and the provider's statements to them. This document has been checked and approved by the attending provider.

## 2016-12-14 NOTE — Progress Notes (Signed)
Please see the Nurse Progress Note in the MD Initial Consult Encounter for this patient. 

## 2016-12-16 DIAGNOSIS — I10 Essential (primary) hypertension: Secondary | ICD-10-CM | POA: Diagnosis not present

## 2016-12-16 DIAGNOSIS — E785 Hyperlipidemia, unspecified: Secondary | ICD-10-CM | POA: Diagnosis not present

## 2016-12-16 DIAGNOSIS — C519 Malignant neoplasm of vulva, unspecified: Secondary | ICD-10-CM | POA: Diagnosis not present

## 2016-12-16 DIAGNOSIS — I509 Heart failure, unspecified: Secondary | ICD-10-CM | POA: Diagnosis not present

## 2016-12-16 DIAGNOSIS — N185 Chronic kidney disease, stage 5: Secondary | ICD-10-CM | POA: Diagnosis not present

## 2016-12-16 DIAGNOSIS — J449 Chronic obstructive pulmonary disease, unspecified: Secondary | ICD-10-CM | POA: Diagnosis not present

## 2016-12-16 DIAGNOSIS — E46 Unspecified protein-calorie malnutrition: Secondary | ICD-10-CM | POA: Diagnosis not present

## 2016-12-16 DIAGNOSIS — K219 Gastro-esophageal reflux disease without esophagitis: Secondary | ICD-10-CM | POA: Diagnosis not present

## 2016-12-16 DIAGNOSIS — C774 Secondary and unspecified malignant neoplasm of inguinal and lower limb lymph nodes: Secondary | ICD-10-CM | POA: Diagnosis not present

## 2016-12-19 ENCOUNTER — Telehealth: Payer: Self-pay | Admitting: Oncology

## 2016-12-19 NOTE — Telephone Encounter (Signed)
Called [facility] to check status of Verapamil.  There was a paid claimed on 12/08/16 for 30 day supply.  The medication is listed as a covered medication.  Derl Barrow, RN

## 2016-12-19 NOTE — Telephone Encounter (Signed)
Left a message for Rickie, patient's daughter asking if Ivana would like to have her CT Simulation appointment at 3 tomorrow.  Requested a return call.

## 2016-12-20 ENCOUNTER — Ambulatory Visit (HOSPITAL_COMMUNITY)
Admission: RE | Admit: 2016-12-20 | Discharge: 2016-12-20 | Disposition: A | Discharge status: Home / Self Care | Source: Ambulatory Visit | Attending: Radiation Oncology | Admitting: Radiation Oncology

## 2016-12-20 ENCOUNTER — Encounter: Payer: Self-pay | Admitting: Radiation Oncology

## 2016-12-20 ENCOUNTER — Ambulatory Visit
Admission: RE | Admit: 2016-12-20 | Discharge: 2016-12-20 | Disposition: A | Discharge status: Home / Self Care | Source: Ambulatory Visit | Attending: Radiation Oncology | Admitting: Radiation Oncology

## 2016-12-20 VITALS — BP 186/80 | HR 67 | Temp 97.6°F | Ht 62.0 in | Wt 98.2 lb

## 2016-12-20 DIAGNOSIS — M25511 Pain in right shoulder: Secondary | ICD-10-CM | POA: Insufficient documentation

## 2016-12-20 DIAGNOSIS — I10 Essential (primary) hypertension: Secondary | ICD-10-CM | POA: Diagnosis not present

## 2016-12-20 DIAGNOSIS — I509 Heart failure, unspecified: Secondary | ICD-10-CM | POA: Diagnosis not present

## 2016-12-20 DIAGNOSIS — C519 Malignant neoplasm of vulva, unspecified: Secondary | ICD-10-CM | POA: Diagnosis not present

## 2016-12-20 DIAGNOSIS — N185 Chronic kidney disease, stage 5: Secondary | ICD-10-CM | POA: Diagnosis not present

## 2016-12-20 DIAGNOSIS — E46 Unspecified protein-calorie malnutrition: Secondary | ICD-10-CM | POA: Diagnosis not present

## 2016-12-20 DIAGNOSIS — C799 Secondary malignant neoplasm of unspecified site: Secondary | ICD-10-CM

## 2016-12-20 DIAGNOSIS — M79621 Pain in right upper arm: Secondary | ICD-10-CM | POA: Diagnosis not present

## 2016-12-20 DIAGNOSIS — Z510 Encounter for antineoplastic radiation therapy: Secondary | ICD-10-CM | POA: Diagnosis not present

## 2016-12-20 DIAGNOSIS — C774 Secondary and unspecified malignant neoplasm of inguinal and lower limb lymph nodes: Secondary | ICD-10-CM | POA: Diagnosis not present

## 2016-12-20 NOTE — Progress Notes (Signed)
Radiation Oncology         [phone] ________________________________  Name: Meredith Bender MRN: [id number]  Date: 12/20/2016  DOB: 1926-03-18  Follow-Up Visit Note  CC: [name], MD  [name], MD    ICD-9-CM ICD-10-CM   1. Primary vulvar cancer (HCC) 184.4 C51.9   2. Metastatic cancer ([place]) 199.1 C79.9     Diagnosis:   Recurrent vulvar carcinoma   Interval Since Last Radiation: 1 year and 6 months 04/02/15 - 05/29/15 : Vulvar region and involved left inguinal lymph node treated to 60 Gy in 30 fractions  Narrative:  The patient returns today for planning of her radiation therapy directed to the large area of recurrence in the right inguinal area. The patient began having pain in the right upper arm and shoulder area last evening and had difficulty sleeping at night. the patient will present to the radiology Department after her planning session today for plain x-rays of the right humerus and shoulder region.                           ALLERGIES:  is allergic to codeine and penicillins.  Meds: Current Outpatient Prescriptions  Medication Sig Dispense Refill  . acetaminophen (TYLENOL) 325 MG tablet Take 650 mg by mouth every 6 (six) hours as needed.    . Cholecalciferol (VITAMIN D3) 3000 units TABS Take 3,000 Units by mouth daily.     . furosemide (LASIX) 40 MG tablet Take one tab by mouth three times a week MWF as needed for fluid overload 30 tablet 3  . hydrALAZINE (APRESOLINE) 25 MG tablet TAKE 1 TABLET(25 MG) BY MOUTH THREE TIMES DAILY 270 tablet 0  . loratadine (CLARITIN) 10 MG tablet Take 1 tablet (10 mg total) by mouth daily. 90 tablet 3  . omeprazole (PRILOSEC) 20 MG capsule Take 1 capsule (20 mg total) by mouth daily. 90 capsule 3  . traMADol (ULTRAM) 50 MG tablet Take one tablet by mouth every eight hours for pain 90 tablet 5  . verapamil (VERELAN PM) 240 MG 24 hr capsule TAKE 1 CAPSULE BY MOUTH DAILY 90 capsule 3  . vitamin E 400 UNIT capsule Take by mouth.    .  hydrALAZINE (APRESOLINE) 25 MG tablet Take 1 tablet (25 mg total) by mouth 3 (three) times daily. (Patient not taking: Reported on 12/14/2016)    . verapamil (VERELAN PM) 240 MG 24 hr capsule TAKE 1 CAPSULE BY MOUTH DAILY (Patient not taking: Reported on 12/14/2016) 90 capsule 0   No current facility-administered medications for this encounter.     Physical Findings: The patient is in no acute distress. Patient is alert and oriented.  height is 5\' 2"  (1.575 m) and weight is 98 lb 3.2 oz (44.5 kg). Her oral temperature is 97.6 F (36.4 C). Her blood pressure is 186/80 (abnormal) and her pulse is 67. Her oxygen saturation is 99%. . Patient has difficulty abducting her right arm. She appears to have some swelling in the upper arm area without any signs of infection. Grip strength is 5 out of 5 in the right hand.  Lab Findings: Lab Results  Component Value Date   WBC 6.7 06/21/2016   HGB 11.0 (L) 06/21/2016   HCT 34.2 (L) 06/21/2016   MCV 88.1 06/21/2016   PLT 238 06/21/2016    Radiographic Findings: Dg Shoulder Right  Result Date: 12/20/2016 CLINICAL DATA:  Pain.  History of vulvar carcinoma EXAM: RIGHT SHOULDER -  2+ VIEW COMPARISON:  None. FINDINGS: Oblique, Y scapular, and axillary images were obtained. No fracture or dislocation. No abnormal periosteal reaction. No blastic or lytic bone lesions. Visualized right lung clear. IMPRESSION: No fracture or dislocation. No neoplastic focus evident. No appreciable arthropathic change. Electronically Signed   By: Lowella Grip III M.D.   On: 12/20/2016 16:11   Dg Humerus Right  Result Date: 12/20/2016 CLINICAL DATA:  Pain.  History of vulvar carcinoma EXAM: RIGHT HUMERUS - 2+ VIEW COMPARISON:  None. FINDINGS: Frontal and lateral views were obtained. No fracture or dislocation. No abnormal periosteal reaction. No blastic or lytic bone lesions. IMPRESSION: No neoplastic focus evident.  No fracture or dislocation. Electronically Signed   By: Lowella Grip III M.D.   On: 12/20/2016 16:10    Impression:  Recurrent vulvar carcinoma. Patient has a significant recurrence in the right groin area   Plan:  She will proceed with planning today. The plain x-rays as above showed no osseous metastasis. Unsure of cause of the patient's pain in the right upper arm area.   ____________________________________ Gery Pray, MD

## 2016-12-20 NOTE — Progress Notes (Signed)
  Radiation Oncology         [phone] ________________________________  Name: Meredith Bender MRN: [id number]  Date: 12/20/2016  DOB: 1925-11-13  SIMULATION AND TREATMENT PLANNING NOTE    ICD-9-CM ICD-10-CM   1. Primary vulvar cancer (HCC) 184.4 C51.9 DG Humerus Right     DG Shoulder Right    DIAGNOSIS:  Recurrent vulvar carcinoma With a large inguinal mass  NARRATIVE:  The patient was brought to the [facility].  Identity was confirmed.  All relevant records and images related to the planned course of therapy were reviewed.  The patient freely provided informed written consent to proceed with treatment after reviewing the details related to the planned course of therapy. The consent form was witnessed and verified by the simulation staff.  Then, the patient was set-up in a stable reproducible  supine position for radiation therapy.  CT images were obtained.  Surface markings were placed.  The CT images were loaded into the planning software.  Then the target and avoidance structures were contoured.  Treatment planning then occurred.  The radiation prescription was entered and confirmed.  Then, I designed and supervised the construction of a total of 5 medically necessary complex treatment devices.  I have requested : 3D Simulation  I have requested a DVH of the following structures: GTV, PTV, right fem head/neck, bladder , rectum.  I have ordered:dose calc.  PLAN:  The patient will receive 30 Gy in 10 fractions.  -----------------------------------  Blair Promise, PhD, MD

## 2016-12-20 NOTE — Progress Notes (Signed)
Meredith Bender is here for follow up.  She reports having severe pain in her right shoulder that started today.  She took tramadol and 2 tylenol this morning.  She denies having any bladder/bowel issues or vaginal bleeding.  BP (!) 186/80 (BP Location: Left Arm, Patient Position: Sitting)   Pulse 67   Temp 97.6 F (36.4 C) (Oral)   Ht 5\' 2"  (1.575 m)   Wt 98 lb 3.2 oz (44.5 kg)   SpO2 99%   BMI 17.96 kg/m    Wt Readings from Last 3 Encounters:  12/20/16 98 lb 3.2 oz (44.5 kg)  12/14/16 100 lb 3.2 oz (45.5 kg)  11/09/16 101 lb 9.6 oz (46.1 kg)

## 2016-12-21 ENCOUNTER — Telehealth: Payer: Self-pay | Admitting: Oncology

## 2016-12-21 DIAGNOSIS — I10 Essential (primary) hypertension: Secondary | ICD-10-CM | POA: Diagnosis not present

## 2016-12-21 DIAGNOSIS — C774 Secondary and unspecified malignant neoplasm of inguinal and lower limb lymph nodes: Secondary | ICD-10-CM | POA: Diagnosis not present

## 2016-12-21 DIAGNOSIS — Z510 Encounter for antineoplastic radiation therapy: Secondary | ICD-10-CM | POA: Diagnosis not present

## 2016-12-21 DIAGNOSIS — I509 Heart failure, unspecified: Secondary | ICD-10-CM | POA: Diagnosis not present

## 2016-12-21 DIAGNOSIS — N185 Chronic kidney disease, stage 5: Secondary | ICD-10-CM | POA: Diagnosis not present

## 2016-12-21 DIAGNOSIS — C519 Malignant neoplasm of vulva, unspecified: Secondary | ICD-10-CM | POA: Diagnosis not present

## 2016-12-21 DIAGNOSIS — E46 Unspecified protein-calorie malnutrition: Secondary | ICD-10-CM | POA: Diagnosis not present

## 2016-12-21 NOTE — Telephone Encounter (Signed)
Notified patient's daughter, [name], about the normal xray results from yesterday per Dr. Sondra Come.  She verbalized understanding and agreement.

## 2016-12-23 ENCOUNTER — Telehealth: Payer: Self-pay | Admitting: *Deleted

## 2016-12-23 ENCOUNTER — Other Ambulatory Visit: Payer: Self-pay | Admitting: Family Medicine

## 2016-12-23 DIAGNOSIS — C774 Secondary and unspecified malignant neoplasm of inguinal and lower limb lymph nodes: Secondary | ICD-10-CM | POA: Diagnosis not present

## 2016-12-23 DIAGNOSIS — I509 Heart failure, unspecified: Secondary | ICD-10-CM | POA: Diagnosis not present

## 2016-12-23 DIAGNOSIS — E46 Unspecified protein-calorie malnutrition: Secondary | ICD-10-CM | POA: Diagnosis not present

## 2016-12-23 DIAGNOSIS — I10 Essential (primary) hypertension: Secondary | ICD-10-CM | POA: Diagnosis not present

## 2016-12-23 DIAGNOSIS — C519 Malignant neoplasm of vulva, unspecified: Secondary | ICD-10-CM | POA: Diagnosis not present

## 2016-12-23 DIAGNOSIS — N185 Chronic kidney disease, stage 5: Secondary | ICD-10-CM | POA: Diagnosis not present

## 2016-12-23 MED ORDER — PREGABALIN 50 MG PO CAPS: 50.0000 mg | ORAL_CAPSULE | Freq: Every day | ORAL | 0 refills | Status: DC

## 2016-12-23 NOTE — Progress Notes (Unsigned)
Hospice nurse reports vesiculopustular rash down left arm Painful, acute.  Hospice physician gave Medrol dose pack. Rash with pain persists Allergy: Valtrex caused oral angioedema Lab: CrCl ~ 17 A/ Possible Herpes Zosters P/  Lyrica 50 mg daily (renal dosing) Increase tramadol to two talbets QID if needed for pain No antiviral given bc of history of Valtrex -induced angioedema of mouth  Keep rash covered until crusts over.  Come into clinic if condition fails to improve or worsens.

## 2016-12-23 NOTE — Telephone Encounter (Signed)
PT daughter called in to say that she thinks her mom may have shingles.  She wanted to know if her mom had the shingles shot and I told her that we did not show any record of the shot and explained to her that we did not give them here that we usually write an RX and get them at pharmacy and sometimes we would get documentation that it had been completed by the pharmacy or the patient.   She stated that she really believes her mom may have shingles and she stated that she has the rash on her arm and there are multiple fluid filled places that are hard not soft.  She is not having pain at the site but in her shoulder joint.  She was started on a prednisone pack yesterday by Hospice.  She said that if Dr. Nori Riis wanted to call her she could be reached at [phone].  I told her that I would send a message to her PCP and to keep [place] updated as she is working with hospice for her moms care. Katharina Caper, April D, [place]

## 2016-12-26 ENCOUNTER — Telehealth: Payer: Self-pay | Admitting: Oncology

## 2016-12-26 DIAGNOSIS — I10 Essential (primary) hypertension: Secondary | ICD-10-CM | POA: Diagnosis not present

## 2016-12-26 DIAGNOSIS — C774 Secondary and unspecified malignant neoplasm of inguinal and lower limb lymph nodes: Secondary | ICD-10-CM | POA: Diagnosis not present

## 2016-12-26 DIAGNOSIS — N185 Chronic kidney disease, stage 5: Secondary | ICD-10-CM | POA: Diagnosis not present

## 2016-12-26 DIAGNOSIS — I509 Heart failure, unspecified: Secondary | ICD-10-CM | POA: Diagnosis not present

## 2016-12-26 DIAGNOSIS — E46 Unspecified protein-calorie malnutrition: Secondary | ICD-10-CM | POA: Diagnosis not present

## 2016-12-26 DIAGNOSIS — C519 Malignant neoplasm of vulva, unspecified: Secondary | ICD-10-CM | POA: Diagnosis not present

## 2016-12-26 NOTE — Telephone Encounter (Signed)
Called Rickie back and advised her that Myrissa can start radiation tomorrow if she feels up to it.  She will need to wear a mask and keep the area covered.  Rickie said her right arm is affected from her shoulder to the palm of her hand with hard, fluid filled blisters.  She has been applying antibiotic ointment and hydrocortisone to them and is wrapping the area with telfa.  Felicha is not able to take Valtrex because of a previous reaction that made her tongue swell.

## 2016-12-26 NOTE — Telephone Encounter (Signed)
Patient's daughter left a message saying that Meredith Bender has been diagnosed with shingles.  She is wondering if it is OK to start radiation tomorrow.

## 2016-12-27 ENCOUNTER — Ambulatory Visit
Admission: RE | Admit: 2016-12-27 | Discharge: 2016-12-27 | Disposition: A | Discharge status: Home / Self Care | Source: Ambulatory Visit | Attending: Radiation Oncology | Admitting: Radiation Oncology

## 2016-12-27 ENCOUNTER — Ambulatory Visit: Admitting: Radiation Oncology

## 2016-12-27 DIAGNOSIS — N185 Chronic kidney disease, stage 5: Secondary | ICD-10-CM | POA: Diagnosis not present

## 2016-12-27 DIAGNOSIS — C519 Malignant neoplasm of vulva, unspecified: Secondary | ICD-10-CM | POA: Diagnosis not present

## 2016-12-27 DIAGNOSIS — C799 Secondary malignant neoplasm of unspecified site: Secondary | ICD-10-CM

## 2016-12-27 DIAGNOSIS — I509 Heart failure, unspecified: Secondary | ICD-10-CM | POA: Diagnosis not present

## 2016-12-27 DIAGNOSIS — C774 Secondary and unspecified malignant neoplasm of inguinal and lower limb lymph nodes: Secondary | ICD-10-CM | POA: Diagnosis not present

## 2016-12-27 DIAGNOSIS — I10 Essential (primary) hypertension: Secondary | ICD-10-CM | POA: Diagnosis not present

## 2016-12-27 DIAGNOSIS — E46 Unspecified protein-calorie malnutrition: Secondary | ICD-10-CM | POA: Diagnosis not present

## 2016-12-27 DIAGNOSIS — Z510 Encounter for antineoplastic radiation therapy: Secondary | ICD-10-CM | POA: Diagnosis not present

## 2016-12-27 NOTE — Progress Notes (Signed)
Pt here for patient teaching.  Pt given Radiation and You booklet. Reviewed areas of pertinence such as fatigue and skin changes . Pt able to give teach back of to pat skin and use unscented/gentle soap,avoid applying anything to skin within 4 hours of treatment. Pt demonstrated understanding and verbalizes understanding of information given and will contact nursing with any questions or concerns.          

## 2016-12-27 NOTE — Progress Notes (Signed)
  Radiation Oncology         [phone] ________________________________  Name: Meredith Bender MRN: [id number]  Date: 12/27/2016  DOB: Feb 10, 1926  Simulation Verification Note    ICD-10-CM   1. Primary vulvar cancer ([place]) C51.9     Status: outpatient  NARRATIVE: The patient was brought to the treatment unit and placed in the planned treatment position. The clinical setup was verified. Then port films were obtained and uploaded to the radiation oncology medical record software.  The treatment beams were carefully compared against the planned radiation fields. The position location and shape of the radiation fields was reviewed. They targeted volume of tissue appears to be appropriately covered by the radiation beams. Organs at risk appear to be excluded as planned.  Based on my personal review, I approved the simulation verification. The patient's treatment will proceed as planned.  -----------------------------------  Blair Promise, PhD, MD

## 2016-12-28 ENCOUNTER — Ambulatory Visit (INDEPENDENT_AMBULATORY_CARE_PROVIDER_SITE_OTHER): Admitting: Family Medicine

## 2016-12-28 ENCOUNTER — Encounter: Payer: Self-pay | Admitting: Family Medicine

## 2016-12-28 ENCOUNTER — Ambulatory Visit
Admission: RE | Admit: 2016-12-28 | Discharge: 2016-12-28 | Disposition: A | Discharge status: Home / Self Care | Source: Ambulatory Visit | Attending: Radiation Oncology | Admitting: Radiation Oncology

## 2016-12-28 VITALS — BP 154/86 | HR 75 | Temp 98.0°F | Ht 62.0 in | Wt 100.4 lb

## 2016-12-28 DIAGNOSIS — C774 Secondary and unspecified malignant neoplasm of inguinal and lower limb lymph nodes: Secondary | ICD-10-CM | POA: Diagnosis not present

## 2016-12-28 DIAGNOSIS — M25519 Pain in unspecified shoulder: Secondary | ICD-10-CM

## 2016-12-28 DIAGNOSIS — I10 Essential (primary) hypertension: Secondary | ICD-10-CM | POA: Diagnosis not present

## 2016-12-28 DIAGNOSIS — Z923 Personal history of irradiation: Secondary | ICD-10-CM | POA: Diagnosis not present

## 2016-12-28 DIAGNOSIS — E46 Unspecified protein-calorie malnutrition: Secondary | ICD-10-CM | POA: Diagnosis not present

## 2016-12-28 DIAGNOSIS — C519 Malignant neoplasm of vulva, unspecified: Secondary | ICD-10-CM | POA: Diagnosis not present

## 2016-12-28 DIAGNOSIS — B028 Zoster with other complications: Secondary | ICD-10-CM

## 2016-12-28 DIAGNOSIS — I509 Heart failure, unspecified: Secondary | ICD-10-CM | POA: Diagnosis not present

## 2016-12-28 DIAGNOSIS — N185 Chronic kidney disease, stage 5: Secondary | ICD-10-CM | POA: Diagnosis not present

## 2016-12-28 DIAGNOSIS — Z510 Encounter for antineoplastic radiation therapy: Secondary | ICD-10-CM | POA: Diagnosis not present

## 2016-12-28 MED ORDER — KETOROLAC TROMETHAMINE 30 MG/ML IJ SOLN
30.0000 mg | Freq: Once | INTRAMUSCULAR | Status: AC
  Administered 2016-12-28: 30 mg via INTRAMUSCULAR

## 2016-12-28 MED ORDER — FENTANYL 25 MCG/HR TD PT72: 25.0000 ug | MEDICATED_PATCH | TRANSDERMAL | 0 refills | Status: DC

## 2016-12-28 NOTE — Progress Notes (Signed)
    CHIEF COMPLAINT / HPI:  Week ago she broke out in what appears to be shingles of her right arm. She is followed by hospice and they gave her a Dosepak of steroids which seemed to help some. She still having severe 7 out of 10 pain despite her use of her chronic pain meds. The blisters are healing. The pain is her worst complaint. Most of the pain is deep within the right shoulder. Aching in nature. Does radiate some down the arm into the hand.  Recurrence of fall her cancer. She is in x-ray therapy for palliation and to decrease the size of the mass in her right groin. She has XRT appointment later today. She's here with her daughter and her son.  REVIEW OF SYSTEMS:  See history of present illness  OBJECTIVE:  Vital signs are reviewed.   GEN.: Elderly female no acute distress. SKIN: Right upper extremity has multiple vesicular lesions in various stages of healing.  VASCULAR: Radial pulses are 2+ bilaterally symmetrical for  SHOULDER: She has symmetrical range of motion both sides PSYCHIATRIC: Alert and oriented 4. Affect is interactive. She has some answers questions appropriately  ASSESSMENT / PLAN: Long discussion with her, her son and her daughter about pain management. She's taking the max dose of tramadol. She has a fairly severe case of shingles and I think she's in quite a bit of pain even with the tramadol she's taking. We ultimately decided to try transdermal low-dose fentanyl patch to give her steady dose of pain. Dosing with transdermal route will make it easier on the family to watch her medication intake is a woman be no pills to take her confusion. We discussed possible side effects. Since she was having some much pain today and was on her way to get XRT I gave her 30 mg of Toradol injection despite her chronic renal insufficiency. I think a one-time dose is safe in a benefits outweigh the risk. She will remain in contact with me via phone and I will see her when  necessary. Greater than 50% of our 30 minute office visit was spent in counseling and education regarding these issues.

## 2016-12-28 NOTE — Patient Instructions (Signed)
Startthe fentanyl patch  Ut may take 8 hours or so to actually "kcik in". I have given you a toradol shot today which is not narcotic so it will not interact. In the first 24 hours of the pain patch dose the tramadol sparingly and ideally have someone either with you or checking ion you frequently  Please call me directly if you have any issues [phone]

## 2016-12-29 ENCOUNTER — Ambulatory Visit
Admission: RE | Admit: 2016-12-29 | Discharge: 2016-12-29 | Disposition: A | Discharge status: Home / Self Care | Source: Ambulatory Visit | Attending: Radiation Oncology | Admitting: Radiation Oncology

## 2016-12-29 ENCOUNTER — Telehealth: Payer: Self-pay | Admitting: *Deleted

## 2016-12-29 DIAGNOSIS — C519 Malignant neoplasm of vulva, unspecified: Secondary | ICD-10-CM | POA: Diagnosis not present

## 2016-12-29 DIAGNOSIS — Z510 Encounter for antineoplastic radiation therapy: Secondary | ICD-10-CM | POA: Diagnosis not present

## 2016-12-29 NOTE — Telephone Encounter (Signed)
Jocelyn Lamer wanted to verify that patient did in fact have shingles, I informed her that based on MD office notes this is what she appears to have. Also Jocelyn Lamer states that patient needs a refill on tramadol and currently they have her taking 1-2 tab every 8 hours as needed, due to recent shingles outbreak. She would also like to be changed from Lyrica to Gabapentin since hospice doesn't cover the lyrica.

## 2016-12-30 ENCOUNTER — Ambulatory Visit

## 2016-12-30 DIAGNOSIS — I509 Heart failure, unspecified: Secondary | ICD-10-CM | POA: Diagnosis not present

## 2016-12-30 DIAGNOSIS — C774 Secondary and unspecified malignant neoplasm of inguinal and lower limb lymph nodes: Secondary | ICD-10-CM | POA: Diagnosis not present

## 2016-12-30 DIAGNOSIS — C519 Malignant neoplasm of vulva, unspecified: Secondary | ICD-10-CM | POA: Diagnosis not present

## 2016-12-30 DIAGNOSIS — E46 Unspecified protein-calorie malnutrition: Secondary | ICD-10-CM | POA: Diagnosis not present

## 2016-12-30 DIAGNOSIS — N185 Chronic kidney disease, stage 5: Secondary | ICD-10-CM | POA: Diagnosis not present

## 2016-12-30 DIAGNOSIS — I10 Essential (primary) hypertension: Secondary | ICD-10-CM | POA: Diagnosis not present

## 2016-12-30 MED ORDER — ONDANSETRON HCL 4 MG PO TABS: 4.0000 mg | ORAL_TABLET | Freq: Three times a day (TID) | ORAL | 5 refills | Status: DC | PRN

## 2016-12-30 MED ORDER — GABAPENTIN 100 MG PO CAPS: 100.0000 mg | ORAL_CAPSULE | Freq: Two times a day (BID) | ORAL | 1 refills | Status: DC

## 2016-12-30 MED ORDER — TRAMADOL HCL 50 MG PO TABS: ORAL_TABLET | ORAL | 5 refills | Status: AC

## 2016-12-30 NOTE — Telephone Encounter (Signed)
Hospice doesn't cover Zofran, but will cover nausea suppositories. ep

## 2016-12-30 NOTE — Telephone Encounter (Signed)
Meredith Bender calling again stating patient has had a lot of nausea today and family is unsure about patient going to radiation. Meredith Bender states patient has promethazine suppositories on her hospice med list but wanted to make sure it was ok for her to take one. Laural Roes to give her the suppositories and I would pass message along to MD

## 2016-12-30 NOTE — Telephone Encounter (Signed)
I have personally called in tramadol to vm at [facility]

## 2016-12-30 NOTE — Telephone Encounter (Signed)
Jocelyn Lamer calling again requesting refills. States patient is running out of tramadol.

## 2016-12-30 NOTE — Telephone Encounter (Signed)
Spoke with her daughter. I think is fine to use the promethazine but I will also call in some ondansetron if that works better then I would just use the ondansetron. She started the Lyrica about the same time she started the fentanyl patch. Probably one of those that's causing her nausea. That stop the Lyrica. We will switch her to gabapentin anyway. She'll he has the fentanyl patch on. If nausea continues throughout the weekend despite stopping the Lyrica and with use of the ondansetron, we may need to rethink the pain patch. I will also call in her tramadol. [name]

## 2017-01-02 ENCOUNTER — Ambulatory Visit
Admission: RE | Admit: 2017-01-02 | Discharge: 2017-01-02 | Disposition: A | Discharge status: Home / Self Care | Source: Ambulatory Visit | Attending: Radiation Oncology | Admitting: Radiation Oncology

## 2017-01-02 ENCOUNTER — Other Ambulatory Visit: Payer: Self-pay

## 2017-01-02 DIAGNOSIS — C519 Malignant neoplasm of vulva, unspecified: Secondary | ICD-10-CM

## 2017-01-02 DIAGNOSIS — I10 Essential (primary) hypertension: Secondary | ICD-10-CM | POA: Diagnosis not present

## 2017-01-02 DIAGNOSIS — N185 Chronic kidney disease, stage 5: Secondary | ICD-10-CM | POA: Diagnosis not present

## 2017-01-02 DIAGNOSIS — C774 Secondary and unspecified malignant neoplasm of inguinal and lower limb lymph nodes: Secondary | ICD-10-CM | POA: Diagnosis not present

## 2017-01-02 DIAGNOSIS — Z510 Encounter for antineoplastic radiation therapy: Secondary | ICD-10-CM | POA: Diagnosis not present

## 2017-01-02 DIAGNOSIS — I509 Heart failure, unspecified: Secondary | ICD-10-CM | POA: Diagnosis not present

## 2017-01-02 DIAGNOSIS — E46 Unspecified protein-calorie malnutrition: Secondary | ICD-10-CM | POA: Diagnosis not present

## 2017-01-02 MED ORDER — ONDANSETRON HCL 4 MG PO TABS
4.0000 mg | ORAL_TABLET | Freq: Once | ORAL | Status: DC
  Filled 2017-01-02: qty 1

## 2017-01-02 MED ORDER — PROMETHAZINE HCL 25 MG RE SUPP: 25.0000 mg | Freq: Three times a day (TID) | RECTAL | 3 refills | Status: AC | PRN

## 2017-01-02 MED ORDER — ONDANSETRON HCL 4 MG PO TABS
4.0000 mg | ORAL_TABLET | Freq: Once | ORAL | Status: AC
  Administered 2017-01-02: 4 mg via ORAL
  Filled 2017-01-02: qty 1

## 2017-01-02 NOTE — Addendum Note (Signed)
Addended byDorcas Mcmurray L on: 01/02/2017 04:26 PM   Modules accepted: Orders

## 2017-01-02 NOTE — Progress Notes (Signed)
[place] over to check patient that was having nausea and vomiting.  Daughter "stated she gave her mother Zofran 4 mg at 0815 this morning with relief from her nausea".  She had nausea with vomiting on the way for her radiation treatment vomited in the car and again after getting into the dressing room she asked for the trash can and vomited once again.  Meredith Bender stated she felt better after the last emesis. Cold wash cloth on her neck.  Refused ice chips after stating she felt better.  Sitting with her daughter talking in the dressing room no mention of any pain or discomfort.  Fentanyl patch started on Thursday, new [patch applied on Sunday. Meredith Bender aware of the situation repeated the Zofran 4 mg po. 1730 Zofran 4 mg given po with water.

## 2017-01-03 ENCOUNTER — Ambulatory Visit
Admission: RE | Admit: 2017-01-03 | Discharge: 2017-01-03 | Disposition: A | Discharge status: Home / Self Care | Source: Ambulatory Visit | Attending: Radiation Oncology | Admitting: Radiation Oncology

## 2017-01-03 ENCOUNTER — Telehealth: Payer: Self-pay | Admitting: Family Medicine

## 2017-01-03 DIAGNOSIS — E46 Unspecified protein-calorie malnutrition: Secondary | ICD-10-CM | POA: Diagnosis not present

## 2017-01-03 DIAGNOSIS — I10 Essential (primary) hypertension: Secondary | ICD-10-CM | POA: Diagnosis not present

## 2017-01-03 DIAGNOSIS — N185 Chronic kidney disease, stage 5: Secondary | ICD-10-CM | POA: Diagnosis not present

## 2017-01-03 DIAGNOSIS — C519 Malignant neoplasm of vulva, unspecified: Secondary | ICD-10-CM | POA: Diagnosis not present

## 2017-01-03 DIAGNOSIS — Z510 Encounter for antineoplastic radiation therapy: Secondary | ICD-10-CM | POA: Diagnosis not present

## 2017-01-03 DIAGNOSIS — C774 Secondary and unspecified malignant neoplasm of inguinal and lower limb lymph nodes: Secondary | ICD-10-CM | POA: Diagnosis not present

## 2017-01-03 DIAGNOSIS — I509 Heart failure, unspecified: Secondary | ICD-10-CM | POA: Diagnosis not present

## 2017-01-03 NOTE — Telephone Encounter (Signed)
Call from [place] [phone]. Did extremely well on Saturday from pain and n/v standpoint. New fentanyl patch placed Sunday--had increased N/V with one episode projectile vomiting despite the phenergan suppositories. acnnot get zofran $$$. Her pain seem better controlled. I had recommended d/c lyrica on Friday so she has not had anyof theat. I suspect N/V related to fentanyl patch. She has only needed one tramadol today so pain control is better. We decided to  1. Remove fentanyl patch no 2. Continue tramadol as needed for pain Stay off lyrica. Ricki will update me by phone in Am If he has any acute worsening or they get  Concerned for dehydration, go to ED. Daughter has my cellphone number.

## 2017-01-04 ENCOUNTER — Ambulatory Visit
Admission: RE | Admit: 2017-01-04 | Discharge: 2017-01-04 | Disposition: A | Discharge status: Home / Self Care | Source: Ambulatory Visit | Attending: Radiation Oncology | Admitting: Radiation Oncology

## 2017-01-04 DIAGNOSIS — C519 Malignant neoplasm of vulva, unspecified: Secondary | ICD-10-CM | POA: Diagnosis not present

## 2017-01-04 DIAGNOSIS — Z510 Encounter for antineoplastic radiation therapy: Secondary | ICD-10-CM | POA: Diagnosis not present

## 2017-01-05 ENCOUNTER — Ambulatory Visit
Admission: RE | Admit: 2017-01-05 | Discharge: 2017-01-05 | Disposition: A | Discharge status: Home / Self Care | Source: Ambulatory Visit | Attending: Radiation Oncology | Admitting: Radiation Oncology

## 2017-01-05 DIAGNOSIS — E46 Unspecified protein-calorie malnutrition: Secondary | ICD-10-CM | POA: Diagnosis not present

## 2017-01-05 DIAGNOSIS — I509 Heart failure, unspecified: Secondary | ICD-10-CM | POA: Diagnosis not present

## 2017-01-05 DIAGNOSIS — C519 Malignant neoplasm of vulva, unspecified: Secondary | ICD-10-CM | POA: Diagnosis not present

## 2017-01-05 DIAGNOSIS — N185 Chronic kidney disease, stage 5: Secondary | ICD-10-CM | POA: Diagnosis not present

## 2017-01-05 DIAGNOSIS — I10 Essential (primary) hypertension: Secondary | ICD-10-CM | POA: Diagnosis not present

## 2017-01-05 DIAGNOSIS — C774 Secondary and unspecified malignant neoplasm of inguinal and lower limb lymph nodes: Secondary | ICD-10-CM | POA: Diagnosis not present

## 2017-01-05 DIAGNOSIS — Z510 Encounter for antineoplastic radiation therapy: Secondary | ICD-10-CM | POA: Diagnosis not present

## 2017-01-06 ENCOUNTER — Ambulatory Visit
Admission: RE | Admit: 2017-01-06 | Discharge: 2017-01-06 | Disposition: A | Discharge status: Home / Self Care | Source: Ambulatory Visit | Attending: Radiation Oncology | Admitting: Radiation Oncology

## 2017-01-06 DIAGNOSIS — Z510 Encounter for antineoplastic radiation therapy: Secondary | ICD-10-CM | POA: Diagnosis not present

## 2017-01-06 DIAGNOSIS — C519 Malignant neoplasm of vulva, unspecified: Secondary | ICD-10-CM | POA: Diagnosis not present

## 2017-01-09 ENCOUNTER — Ambulatory Visit
Admission: RE | Admit: 2017-01-09 | Discharge: 2017-01-09 | Disposition: A | Discharge status: Home / Self Care | Source: Ambulatory Visit | Attending: Radiation Oncology | Admitting: Radiation Oncology

## 2017-01-09 ENCOUNTER — Ambulatory Visit

## 2017-01-09 DIAGNOSIS — Z510 Encounter for antineoplastic radiation therapy: Secondary | ICD-10-CM | POA: Diagnosis not present

## 2017-01-09 DIAGNOSIS — C519 Malignant neoplasm of vulva, unspecified: Secondary | ICD-10-CM | POA: Diagnosis not present

## 2017-01-10 ENCOUNTER — Other Ambulatory Visit: Payer: Self-pay | Admitting: Family Medicine

## 2017-01-10 ENCOUNTER — Ambulatory Visit
Admission: RE | Admit: 2017-01-10 | Discharge: 2017-01-10 | Disposition: A | Discharge status: Home / Self Care | Source: Ambulatory Visit | Attending: Radiation Oncology | Admitting: Radiation Oncology

## 2017-01-10 DIAGNOSIS — C519 Malignant neoplasm of vulva, unspecified: Secondary | ICD-10-CM | POA: Diagnosis not present

## 2017-01-10 DIAGNOSIS — E46 Unspecified protein-calorie malnutrition: Secondary | ICD-10-CM | POA: Diagnosis not present

## 2017-01-10 DIAGNOSIS — N185 Chronic kidney disease, stage 5: Secondary | ICD-10-CM | POA: Diagnosis not present

## 2017-01-10 DIAGNOSIS — I10 Essential (primary) hypertension: Secondary | ICD-10-CM | POA: Diagnosis not present

## 2017-01-10 DIAGNOSIS — I509 Heart failure, unspecified: Secondary | ICD-10-CM | POA: Diagnosis not present

## 2017-01-10 DIAGNOSIS — C774 Secondary and unspecified malignant neoplasm of inguinal and lower limb lymph nodes: Secondary | ICD-10-CM | POA: Diagnosis not present

## 2017-01-10 DIAGNOSIS — Z510 Encounter for antineoplastic radiation therapy: Secondary | ICD-10-CM | POA: Diagnosis not present

## 2017-01-10 NOTE — Progress Notes (Signed)
Meredith Bender will call to give end of treatment one month follow up appointment and mail card to her home.

## 2017-01-11 ENCOUNTER — Telehealth: Payer: Self-pay | Admitting: *Deleted

## 2017-01-11 NOTE — Telephone Encounter (Signed)
CALLED PATIENT TO INFORM OF FU APPT. WITH DR. KINARD ON 02-13-17 @ 3:30 PM, LVM FOR A RETURN CALL

## 2017-01-12 DIAGNOSIS — C519 Malignant neoplasm of vulva, unspecified: Secondary | ICD-10-CM | POA: Diagnosis not present

## 2017-01-12 DIAGNOSIS — N185 Chronic kidney disease, stage 5: Secondary | ICD-10-CM | POA: Diagnosis not present

## 2017-01-12 DIAGNOSIS — C774 Secondary and unspecified malignant neoplasm of inguinal and lower limb lymph nodes: Secondary | ICD-10-CM | POA: Diagnosis not present

## 2017-01-12 DIAGNOSIS — I10 Essential (primary) hypertension: Secondary | ICD-10-CM | POA: Diagnosis not present

## 2017-01-12 DIAGNOSIS — I509 Heart failure, unspecified: Secondary | ICD-10-CM | POA: Diagnosis not present

## 2017-01-12 DIAGNOSIS — E46 Unspecified protein-calorie malnutrition: Secondary | ICD-10-CM | POA: Diagnosis not present

## 2017-01-15 DIAGNOSIS — K219 Gastro-esophageal reflux disease without esophagitis: Secondary | ICD-10-CM | POA: Diagnosis not present

## 2017-01-15 DIAGNOSIS — C519 Malignant neoplasm of vulva, unspecified: Secondary | ICD-10-CM | POA: Diagnosis not present

## 2017-01-15 DIAGNOSIS — C774 Secondary and unspecified malignant neoplasm of inguinal and lower limb lymph nodes: Secondary | ICD-10-CM | POA: Diagnosis not present

## 2017-01-15 DIAGNOSIS — E46 Unspecified protein-calorie malnutrition: Secondary | ICD-10-CM | POA: Diagnosis not present

## 2017-01-15 DIAGNOSIS — E785 Hyperlipidemia, unspecified: Secondary | ICD-10-CM | POA: Diagnosis not present

## 2017-01-15 DIAGNOSIS — J449 Chronic obstructive pulmonary disease, unspecified: Secondary | ICD-10-CM | POA: Diagnosis not present

## 2017-01-15 DIAGNOSIS — I509 Heart failure, unspecified: Secondary | ICD-10-CM | POA: Diagnosis not present

## 2017-01-15 DIAGNOSIS — I10 Essential (primary) hypertension: Secondary | ICD-10-CM | POA: Diagnosis not present

## 2017-01-15 DIAGNOSIS — N185 Chronic kidney disease, stage 5: Secondary | ICD-10-CM | POA: Diagnosis not present

## 2017-01-16 ENCOUNTER — Encounter: Payer: Self-pay | Admitting: Radiation Oncology

## 2017-01-16 NOTE — Progress Notes (Signed)
  Radiation Oncology         [phone] ________________________________  Name: Meredith Bender MRN: [id number]  Date: 01/16/2017  DOB: 1926/05/16  End of Treatment Note  Diagnosis:   Recurrent vulvar carcinoma  Indication for treatment:  Palliative       Radiation treatment dates:   12/27/16 - 01/10/17  Site/dose:   Right Inguinal mass treated to 30 Gy in 10 fractions  Beams/energy:   3D  //  6X, 10X  Narrative: The patient tolerated radiation treatment relatively well. The patient underwent treatment for shingles during her radiation therapy, but overall was without complaint in relation to her RT.   Plan: The patient has completed radiation treatment. The patient will return to radiation oncology clinic for routine followup in one month. I advised them to call or return sooner if they have any questions or concerns related to their recovery or treatment.  -----------------------------------  Blair Promise, PhD, MD  This document serves as a record of services personally performed by Gery Pray, MD. It was created on his behalf by Maryla Morrow, a trained medical scribe. The creation of this record is based on the scribe's personal observations and the provider's statements to them. This document has been checked and approved by the attending provider.

## 2017-01-17 DIAGNOSIS — I509 Heart failure, unspecified: Secondary | ICD-10-CM | POA: Diagnosis not present

## 2017-01-17 DIAGNOSIS — I10 Essential (primary) hypertension: Secondary | ICD-10-CM | POA: Diagnosis not present

## 2017-01-17 DIAGNOSIS — E46 Unspecified protein-calorie malnutrition: Secondary | ICD-10-CM | POA: Diagnosis not present

## 2017-01-17 DIAGNOSIS — N185 Chronic kidney disease, stage 5: Secondary | ICD-10-CM | POA: Diagnosis not present

## 2017-01-17 DIAGNOSIS — C519 Malignant neoplasm of vulva, unspecified: Secondary | ICD-10-CM | POA: Diagnosis not present

## 2017-01-17 DIAGNOSIS — C774 Secondary and unspecified malignant neoplasm of inguinal and lower limb lymph nodes: Secondary | ICD-10-CM | POA: Diagnosis not present

## 2017-01-24 DIAGNOSIS — C774 Secondary and unspecified malignant neoplasm of inguinal and lower limb lymph nodes: Secondary | ICD-10-CM | POA: Diagnosis not present

## 2017-01-24 DIAGNOSIS — C519 Malignant neoplasm of vulva, unspecified: Secondary | ICD-10-CM | POA: Diagnosis not present

## 2017-01-24 DIAGNOSIS — N185 Chronic kidney disease, stage 5: Secondary | ICD-10-CM | POA: Diagnosis not present

## 2017-01-24 DIAGNOSIS — I10 Essential (primary) hypertension: Secondary | ICD-10-CM | POA: Diagnosis not present

## 2017-01-24 DIAGNOSIS — I509 Heart failure, unspecified: Secondary | ICD-10-CM | POA: Diagnosis not present

## 2017-01-24 DIAGNOSIS — E46 Unspecified protein-calorie malnutrition: Secondary | ICD-10-CM | POA: Diagnosis not present

## 2017-01-31 DIAGNOSIS — C774 Secondary and unspecified malignant neoplasm of inguinal and lower limb lymph nodes: Secondary | ICD-10-CM | POA: Diagnosis not present

## 2017-01-31 DIAGNOSIS — I10 Essential (primary) hypertension: Secondary | ICD-10-CM | POA: Diagnosis not present

## 2017-01-31 DIAGNOSIS — I509 Heart failure, unspecified: Secondary | ICD-10-CM | POA: Diagnosis not present

## 2017-01-31 DIAGNOSIS — C519 Malignant neoplasm of vulva, unspecified: Secondary | ICD-10-CM | POA: Diagnosis not present

## 2017-01-31 DIAGNOSIS — N185 Chronic kidney disease, stage 5: Secondary | ICD-10-CM | POA: Diagnosis not present

## 2017-01-31 DIAGNOSIS — E46 Unspecified protein-calorie malnutrition: Secondary | ICD-10-CM | POA: Diagnosis not present

## 2017-02-01 DIAGNOSIS — I509 Heart failure, unspecified: Secondary | ICD-10-CM | POA: Diagnosis not present

## 2017-02-01 DIAGNOSIS — I10 Essential (primary) hypertension: Secondary | ICD-10-CM | POA: Diagnosis not present

## 2017-02-01 DIAGNOSIS — N185 Chronic kidney disease, stage 5: Secondary | ICD-10-CM | POA: Diagnosis not present

## 2017-02-01 DIAGNOSIS — C774 Secondary and unspecified malignant neoplasm of inguinal and lower limb lymph nodes: Secondary | ICD-10-CM | POA: Diagnosis not present

## 2017-02-01 DIAGNOSIS — C519 Malignant neoplasm of vulva, unspecified: Secondary | ICD-10-CM | POA: Diagnosis not present

## 2017-02-01 DIAGNOSIS — E46 Unspecified protein-calorie malnutrition: Secondary | ICD-10-CM | POA: Diagnosis not present

## 2017-02-05 DIAGNOSIS — I10 Essential (primary) hypertension: Secondary | ICD-10-CM | POA: Diagnosis not present

## 2017-02-05 DIAGNOSIS — I509 Heart failure, unspecified: Secondary | ICD-10-CM | POA: Diagnosis not present

## 2017-02-05 DIAGNOSIS — E46 Unspecified protein-calorie malnutrition: Secondary | ICD-10-CM | POA: Diagnosis not present

## 2017-02-05 DIAGNOSIS — C774 Secondary and unspecified malignant neoplasm of inguinal and lower limb lymph nodes: Secondary | ICD-10-CM | POA: Diagnosis not present

## 2017-02-05 DIAGNOSIS — C519 Malignant neoplasm of vulva, unspecified: Secondary | ICD-10-CM | POA: Diagnosis not present

## 2017-02-05 DIAGNOSIS — N185 Chronic kidney disease, stage 5: Secondary | ICD-10-CM | POA: Diagnosis not present

## 2017-02-06 DIAGNOSIS — E46 Unspecified protein-calorie malnutrition: Secondary | ICD-10-CM | POA: Diagnosis not present

## 2017-02-06 DIAGNOSIS — C774 Secondary and unspecified malignant neoplasm of inguinal and lower limb lymph nodes: Secondary | ICD-10-CM | POA: Diagnosis not present

## 2017-02-06 DIAGNOSIS — I10 Essential (primary) hypertension: Secondary | ICD-10-CM | POA: Diagnosis not present

## 2017-02-06 DIAGNOSIS — C519 Malignant neoplasm of vulva, unspecified: Secondary | ICD-10-CM | POA: Diagnosis not present

## 2017-02-06 DIAGNOSIS — N185 Chronic kidney disease, stage 5: Secondary | ICD-10-CM | POA: Diagnosis not present

## 2017-02-06 DIAGNOSIS — I509 Heart failure, unspecified: Secondary | ICD-10-CM | POA: Diagnosis not present

## 2017-02-10 ENCOUNTER — Encounter: Payer: Self-pay | Admitting: Oncology

## 2017-02-13 ENCOUNTER — Ambulatory Visit
Admission: RE | Admit: 2017-02-13 | Discharge: 2017-02-13 | Disposition: A | Discharge status: Home / Self Care | Source: Ambulatory Visit | Attending: Radiation Oncology | Admitting: Radiation Oncology

## 2017-02-13 ENCOUNTER — Encounter: Payer: Self-pay | Admitting: Radiation Oncology

## 2017-02-13 VITALS — BP 144/61 | HR 73 | Temp 97.6°F | Ht 62.0 in | Wt 97.8 lb

## 2017-02-13 DIAGNOSIS — C519 Malignant neoplasm of vulva, unspecified: Secondary | ICD-10-CM | POA: Diagnosis not present

## 2017-02-13 DIAGNOSIS — C799 Secondary malignant neoplasm of unspecified site: Secondary | ICD-10-CM | POA: Insufficient documentation

## 2017-02-13 NOTE — Progress Notes (Signed)
Radiation Oncology         [phone] ________________________________  Name: KAELAN EMAMI MRN: [id number]  Date: 02/13/2017  DOB: 05-30-1926  Follow-Up Visit Note  CC: [name], MD  [name], MD    ICD-10-CM   1. Primary vulvar cancer ([place]) C51.9   2. Metastatic cancer (HCC) C79.9     Diagnosis:   83 y.o. woman with Recurrent vulvar carcinoma.  Interval Since Last Radiation:  4 weeks   12/27/16 - 01/10/17  Site/dose:   Right Inguinal Pelvis treated to 30 Gy in 10 fractions  Beams/energy:   3D  //  6X, 10X  Narrative:  The patient returns today for routine follow-up.   She continues to recover from shingles. She denies any pain along the right groin area bleeding or discharge.  Hospice is coming out to her house once a week                           ALLERGIES:  is allergic to codeine and penicillins.  Meds: Current Outpatient Prescriptions  Medication Sig Dispense Refill  . acetaminophen (TYLENOL) 325 MG tablet Take 650 mg by mouth every 6 (six) hours as needed.    . Cholecalciferol (VITAMIN D3) 3000 units TABS Take 3,000 Units by mouth daily.     . furosemide (LASIX) 40 MG tablet Take one tab by mouth three times a week MWF as needed for fluid overload 30 tablet 3  . hydrALAZINE (APRESOLINE) 25 MG tablet Take 1 tablet (25 mg total) by mouth 3 (three) times daily. (Patient not taking: Reported on 12/14/2016)    . hydrALAZINE (APRESOLINE) 25 MG tablet TAKE 1 TABLET(25 MG) BY MOUTH THREE TIMES DAILY 270 tablet 0  . loratadine (CLARITIN) 10 MG tablet Take 1 tablet (10 mg total) by mouth daily. 90 tablet 3  . omeprazole (PRILOSEC) 20 MG capsule Take 1 capsule (20 mg total) by mouth daily. 90 capsule 3  . promethazine (PHENERGAN) 25 MG suppository Place 1 suppository (25 mg total) rectally every 8 (eight) hours as needed for nausea or vomiting. 30 each 3  . traMADol (ULTRAM) 50 MG tablet Take one or two tablets by mouth every eight hours for pain 180 tablet 5  .  verapamil (VERELAN PM) 240 MG 24 hr capsule TAKE 1 CAPSULE BY MOUTH DAILY (Patient not taking: Reported on 12/14/2016) 90 capsule 0  . verapamil (VERELAN PM) 240 MG 24 hr capsule TAKE 1 CAPSULE BY MOUTH DAILY 90 capsule 3  . vitamin E 400 UNIT capsule Take by mouth.     No current facility-administered medications for this encounter.     Physical Findings: The patient is in no acute distress. Patient is alert and oriented.  height is 5\' 2"  (1.575 m) and weight is 97 lb 12.8 oz (44.4 kg). Her oral temperature is 97.6 F (36.4 C). Her blood pressure is 144/61 (abnormal) and her pulse is 73. Her oxygen saturation is 97%. .   The lungs are clear. The heart has a regular rhythm and rate. The abdomen is soft and nontender with normal bowel sounds.  The right inguinal mass has decreased significantly in size. Skin is intact. No drainage or bleeding noted.  Lab Findings: Lab Results  Component Value Date   WBC 6.7 06/21/2016   HGB 11.0 (L) 06/21/2016   HCT 34.2 (L) 06/21/2016   MCV 88.1 06/21/2016   PLT 238 06/21/2016    Radiographic Findings: No results  found.  Impression:    Nice response to palliative radiation therapy directed at the right groin mass.  Plan:   When necessary follow-up in radiation oncology. The patient will be followed closely by hospice.  ___________ -----------------------------------  Blair Promise, PhD, MD  This document serves as a record of services personally performed by Gery Pray MD. It was created on his behalf by Delton Coombes, a trained medical scribe. The creation of this record is based on the scribe's personal observations and the provider's statements to them. This document has been checked and approved by the attending provider.

## 2017-02-13 NOTE — Progress Notes (Signed)
Meredith Bender is here for follow up with her daughter.  She denies having any pain.  She reports having urgency with urination and with bowel movements that started with radiation.  She denies having any vaginal bleeding or discharge.  She denies having any skin irritation in the treatment area.  She said her right arm is itching from her healing shingles.  She reports having fatigue.    BP (!) 144/61 (BP Location: Left Arm, Patient Position: Sitting)   Pulse 73   Temp 97.6 F (36.4 C) (Oral)   Ht 5\' 2"  (1.575 m)   Wt 97 lb 12.8 oz (44.4 kg)   SpO2 97%   BMI 17.89 kg/m    Wt Readings from Last 3 Encounters:  02/13/17 97 lb 12.8 oz (44.4 kg)  12/28/16 100 lb 6.4 oz (45.5 kg)  12/20/16 98 lb 3.2 oz (44.5 kg)

## 2017-02-15 DIAGNOSIS — K219 Gastro-esophageal reflux disease without esophagitis: Secondary | ICD-10-CM | POA: Diagnosis not present

## 2017-02-15 DIAGNOSIS — E785 Hyperlipidemia, unspecified: Secondary | ICD-10-CM | POA: Diagnosis not present

## 2017-02-15 DIAGNOSIS — I509 Heart failure, unspecified: Secondary | ICD-10-CM | POA: Diagnosis not present

## 2017-02-15 DIAGNOSIS — I10 Essential (primary) hypertension: Secondary | ICD-10-CM | POA: Diagnosis not present

## 2017-02-15 DIAGNOSIS — N185 Chronic kidney disease, stage 5: Secondary | ICD-10-CM | POA: Diagnosis not present

## 2017-02-15 DIAGNOSIS — E46 Unspecified protein-calorie malnutrition: Secondary | ICD-10-CM | POA: Diagnosis not present

## 2017-02-15 DIAGNOSIS — C774 Secondary and unspecified malignant neoplasm of inguinal and lower limb lymph nodes: Secondary | ICD-10-CM | POA: Diagnosis not present

## 2017-02-15 DIAGNOSIS — C519 Malignant neoplasm of vulva, unspecified: Secondary | ICD-10-CM | POA: Diagnosis not present

## 2017-02-15 DIAGNOSIS — J449 Chronic obstructive pulmonary disease, unspecified: Secondary | ICD-10-CM | POA: Diagnosis not present

## 2017-02-20 DIAGNOSIS — C774 Secondary and unspecified malignant neoplasm of inguinal and lower limb lymph nodes: Secondary | ICD-10-CM | POA: Diagnosis not present

## 2017-02-20 DIAGNOSIS — I10 Essential (primary) hypertension: Secondary | ICD-10-CM | POA: Diagnosis not present

## 2017-02-20 DIAGNOSIS — N185 Chronic kidney disease, stage 5: Secondary | ICD-10-CM | POA: Diagnosis not present

## 2017-02-20 DIAGNOSIS — I509 Heart failure, unspecified: Secondary | ICD-10-CM | POA: Diagnosis not present

## 2017-02-20 DIAGNOSIS — E46 Unspecified protein-calorie malnutrition: Secondary | ICD-10-CM | POA: Diagnosis not present

## 2017-02-20 DIAGNOSIS — C519 Malignant neoplasm of vulva, unspecified: Secondary | ICD-10-CM | POA: Diagnosis not present

## 2017-02-21 DIAGNOSIS — I509 Heart failure, unspecified: Secondary | ICD-10-CM | POA: Diagnosis not present

## 2017-02-21 DIAGNOSIS — N185 Chronic kidney disease, stage 5: Secondary | ICD-10-CM | POA: Diagnosis not present

## 2017-02-21 DIAGNOSIS — C519 Malignant neoplasm of vulva, unspecified: Secondary | ICD-10-CM | POA: Diagnosis not present

## 2017-02-21 DIAGNOSIS — I10 Essential (primary) hypertension: Secondary | ICD-10-CM | POA: Diagnosis not present

## 2017-02-21 DIAGNOSIS — E46 Unspecified protein-calorie malnutrition: Secondary | ICD-10-CM | POA: Diagnosis not present

## 2017-02-21 DIAGNOSIS — C774 Secondary and unspecified malignant neoplasm of inguinal and lower limb lymph nodes: Secondary | ICD-10-CM | POA: Diagnosis not present

## 2017-02-26 DIAGNOSIS — C774 Secondary and unspecified malignant neoplasm of inguinal and lower limb lymph nodes: Secondary | ICD-10-CM | POA: Diagnosis not present

## 2017-02-26 DIAGNOSIS — C519 Malignant neoplasm of vulva, unspecified: Secondary | ICD-10-CM | POA: Diagnosis not present

## 2017-02-26 DIAGNOSIS — N185 Chronic kidney disease, stage 5: Secondary | ICD-10-CM | POA: Diagnosis not present

## 2017-02-26 DIAGNOSIS — I10 Essential (primary) hypertension: Secondary | ICD-10-CM | POA: Diagnosis not present

## 2017-02-26 DIAGNOSIS — I509 Heart failure, unspecified: Secondary | ICD-10-CM | POA: Diagnosis not present

## 2017-02-26 DIAGNOSIS — E46 Unspecified protein-calorie malnutrition: Secondary | ICD-10-CM | POA: Diagnosis not present

## 2017-02-27 DIAGNOSIS — C519 Malignant neoplasm of vulva, unspecified: Secondary | ICD-10-CM | POA: Diagnosis not present

## 2017-02-27 DIAGNOSIS — I10 Essential (primary) hypertension: Secondary | ICD-10-CM | POA: Diagnosis not present

## 2017-02-27 DIAGNOSIS — N185 Chronic kidney disease, stage 5: Secondary | ICD-10-CM | POA: Diagnosis not present

## 2017-02-27 DIAGNOSIS — I509 Heart failure, unspecified: Secondary | ICD-10-CM | POA: Diagnosis not present

## 2017-02-27 DIAGNOSIS — C774 Secondary and unspecified malignant neoplasm of inguinal and lower limb lymph nodes: Secondary | ICD-10-CM | POA: Diagnosis not present

## 2017-02-27 DIAGNOSIS — E46 Unspecified protein-calorie malnutrition: Secondary | ICD-10-CM | POA: Diagnosis not present

## 2017-02-28 DIAGNOSIS — E46 Unspecified protein-calorie malnutrition: Secondary | ICD-10-CM | POA: Diagnosis not present

## 2017-02-28 DIAGNOSIS — I509 Heart failure, unspecified: Secondary | ICD-10-CM | POA: Diagnosis not present

## 2017-02-28 DIAGNOSIS — C774 Secondary and unspecified malignant neoplasm of inguinal and lower limb lymph nodes: Secondary | ICD-10-CM | POA: Diagnosis not present

## 2017-02-28 DIAGNOSIS — I10 Essential (primary) hypertension: Secondary | ICD-10-CM | POA: Diagnosis not present

## 2017-02-28 DIAGNOSIS — C519 Malignant neoplasm of vulva, unspecified: Secondary | ICD-10-CM | POA: Diagnosis not present

## 2017-02-28 DIAGNOSIS — N185 Chronic kidney disease, stage 5: Secondary | ICD-10-CM | POA: Diagnosis not present

## 2017-03-01 DIAGNOSIS — E46 Unspecified protein-calorie malnutrition: Secondary | ICD-10-CM | POA: Diagnosis not present

## 2017-03-01 DIAGNOSIS — I509 Heart failure, unspecified: Secondary | ICD-10-CM | POA: Diagnosis not present

## 2017-03-01 DIAGNOSIS — C519 Malignant neoplasm of vulva, unspecified: Secondary | ICD-10-CM | POA: Diagnosis not present

## 2017-03-01 DIAGNOSIS — I10 Essential (primary) hypertension: Secondary | ICD-10-CM | POA: Diagnosis not present

## 2017-03-01 DIAGNOSIS — C774 Secondary and unspecified malignant neoplasm of inguinal and lower limb lymph nodes: Secondary | ICD-10-CM | POA: Diagnosis not present

## 2017-03-01 DIAGNOSIS — N185 Chronic kidney disease, stage 5: Secondary | ICD-10-CM | POA: Diagnosis not present

## 2017-03-02 DIAGNOSIS — C519 Malignant neoplasm of vulva, unspecified: Secondary | ICD-10-CM | POA: Diagnosis not present

## 2017-03-02 DIAGNOSIS — E46 Unspecified protein-calorie malnutrition: Secondary | ICD-10-CM | POA: Diagnosis not present

## 2017-03-02 DIAGNOSIS — I10 Essential (primary) hypertension: Secondary | ICD-10-CM | POA: Diagnosis not present

## 2017-03-02 DIAGNOSIS — I509 Heart failure, unspecified: Secondary | ICD-10-CM | POA: Diagnosis not present

## 2017-03-02 DIAGNOSIS — C774 Secondary and unspecified malignant neoplasm of inguinal and lower limb lymph nodes: Secondary | ICD-10-CM | POA: Diagnosis not present

## 2017-03-02 DIAGNOSIS — N185 Chronic kidney disease, stage 5: Secondary | ICD-10-CM | POA: Diagnosis not present

## 2017-03-05 DIAGNOSIS — I509 Heart failure, unspecified: Secondary | ICD-10-CM | POA: Diagnosis not present

## 2017-03-05 DIAGNOSIS — I10 Essential (primary) hypertension: Secondary | ICD-10-CM | POA: Diagnosis not present

## 2017-03-05 DIAGNOSIS — C519 Malignant neoplasm of vulva, unspecified: Secondary | ICD-10-CM | POA: Diagnosis not present

## 2017-03-05 DIAGNOSIS — N185 Chronic kidney disease, stage 5: Secondary | ICD-10-CM | POA: Diagnosis not present

## 2017-03-05 DIAGNOSIS — C774 Secondary and unspecified malignant neoplasm of inguinal and lower limb lymph nodes: Secondary | ICD-10-CM | POA: Diagnosis not present

## 2017-03-05 DIAGNOSIS — E46 Unspecified protein-calorie malnutrition: Secondary | ICD-10-CM | POA: Diagnosis not present

## 2017-03-06 DIAGNOSIS — I10 Essential (primary) hypertension: Secondary | ICD-10-CM | POA: Diagnosis not present

## 2017-03-06 DIAGNOSIS — E46 Unspecified protein-calorie malnutrition: Secondary | ICD-10-CM | POA: Diagnosis not present

## 2017-03-06 DIAGNOSIS — C519 Malignant neoplasm of vulva, unspecified: Secondary | ICD-10-CM | POA: Diagnosis not present

## 2017-03-06 DIAGNOSIS — C774 Secondary and unspecified malignant neoplasm of inguinal and lower limb lymph nodes: Secondary | ICD-10-CM | POA: Diagnosis not present

## 2017-03-06 DIAGNOSIS — N185 Chronic kidney disease, stage 5: Secondary | ICD-10-CM | POA: Diagnosis not present

## 2017-03-06 DIAGNOSIS — I509 Heart failure, unspecified: Secondary | ICD-10-CM | POA: Diagnosis not present

## 2017-03-07 DIAGNOSIS — N185 Chronic kidney disease, stage 5: Secondary | ICD-10-CM | POA: Diagnosis not present

## 2017-03-07 DIAGNOSIS — I10 Essential (primary) hypertension: Secondary | ICD-10-CM | POA: Diagnosis not present

## 2017-03-07 DIAGNOSIS — I509 Heart failure, unspecified: Secondary | ICD-10-CM | POA: Diagnosis not present

## 2017-03-07 DIAGNOSIS — C519 Malignant neoplasm of vulva, unspecified: Secondary | ICD-10-CM | POA: Diagnosis not present

## 2017-03-07 DIAGNOSIS — E46 Unspecified protein-calorie malnutrition: Secondary | ICD-10-CM | POA: Diagnosis not present

## 2017-03-07 DIAGNOSIS — C774 Secondary and unspecified malignant neoplasm of inguinal and lower limb lymph nodes: Secondary | ICD-10-CM | POA: Diagnosis not present

## 2017-03-08 DIAGNOSIS — C519 Malignant neoplasm of vulva, unspecified: Secondary | ICD-10-CM | POA: Diagnosis not present

## 2017-03-08 DIAGNOSIS — I10 Essential (primary) hypertension: Secondary | ICD-10-CM | POA: Diagnosis not present

## 2017-03-08 DIAGNOSIS — N185 Chronic kidney disease, stage 5: Secondary | ICD-10-CM | POA: Diagnosis not present

## 2017-03-08 DIAGNOSIS — E46 Unspecified protein-calorie malnutrition: Secondary | ICD-10-CM | POA: Diagnosis not present

## 2017-03-08 DIAGNOSIS — C774 Secondary and unspecified malignant neoplasm of inguinal and lower limb lymph nodes: Secondary | ICD-10-CM | POA: Diagnosis not present

## 2017-03-08 DIAGNOSIS — I509 Heart failure, unspecified: Secondary | ICD-10-CM | POA: Diagnosis not present

## 2017-03-09 DIAGNOSIS — N185 Chronic kidney disease, stage 5: Secondary | ICD-10-CM | POA: Diagnosis not present

## 2017-03-09 DIAGNOSIS — I10 Essential (primary) hypertension: Secondary | ICD-10-CM | POA: Diagnosis not present

## 2017-03-09 DIAGNOSIS — E46 Unspecified protein-calorie malnutrition: Secondary | ICD-10-CM | POA: Diagnosis not present

## 2017-03-09 DIAGNOSIS — C519 Malignant neoplasm of vulva, unspecified: Secondary | ICD-10-CM | POA: Diagnosis not present

## 2017-03-09 DIAGNOSIS — C774 Secondary and unspecified malignant neoplasm of inguinal and lower limb lymph nodes: Secondary | ICD-10-CM | POA: Diagnosis not present

## 2017-03-09 DIAGNOSIS — I509 Heart failure, unspecified: Secondary | ICD-10-CM | POA: Diagnosis not present

## 2017-03-13 DIAGNOSIS — I10 Essential (primary) hypertension: Secondary | ICD-10-CM | POA: Diagnosis not present

## 2017-03-13 DIAGNOSIS — C774 Secondary and unspecified malignant neoplasm of inguinal and lower limb lymph nodes: Secondary | ICD-10-CM | POA: Diagnosis not present

## 2017-03-13 DIAGNOSIS — I509 Heart failure, unspecified: Secondary | ICD-10-CM | POA: Diagnosis not present

## 2017-03-13 DIAGNOSIS — E46 Unspecified protein-calorie malnutrition: Secondary | ICD-10-CM | POA: Diagnosis not present

## 2017-03-13 DIAGNOSIS — N185 Chronic kidney disease, stage 5: Secondary | ICD-10-CM | POA: Diagnosis not present

## 2017-03-13 DIAGNOSIS — C519 Malignant neoplasm of vulva, unspecified: Secondary | ICD-10-CM | POA: Diagnosis not present

## 2017-03-15 DIAGNOSIS — I509 Heart failure, unspecified: Secondary | ICD-10-CM | POA: Diagnosis not present

## 2017-03-15 DIAGNOSIS — C519 Malignant neoplasm of vulva, unspecified: Secondary | ICD-10-CM | POA: Diagnosis not present

## 2017-03-15 DIAGNOSIS — N185 Chronic kidney disease, stage 5: Secondary | ICD-10-CM | POA: Diagnosis not present

## 2017-03-15 DIAGNOSIS — C774 Secondary and unspecified malignant neoplasm of inguinal and lower limb lymph nodes: Secondary | ICD-10-CM | POA: Diagnosis not present

## 2017-03-15 DIAGNOSIS — I10 Essential (primary) hypertension: Secondary | ICD-10-CM | POA: Diagnosis not present

## 2017-03-15 DIAGNOSIS — E46 Unspecified protein-calorie malnutrition: Secondary | ICD-10-CM | POA: Diagnosis not present

## 2017-03-18 DIAGNOSIS — C519 Malignant neoplasm of vulva, unspecified: Secondary | ICD-10-CM | POA: Diagnosis not present

## 2017-03-18 DIAGNOSIS — C774 Secondary and unspecified malignant neoplasm of inguinal and lower limb lymph nodes: Secondary | ICD-10-CM | POA: Diagnosis not present

## 2017-03-18 DIAGNOSIS — I509 Heart failure, unspecified: Secondary | ICD-10-CM | POA: Diagnosis not present

## 2017-03-18 DIAGNOSIS — E785 Hyperlipidemia, unspecified: Secondary | ICD-10-CM | POA: Diagnosis not present

## 2017-03-18 DIAGNOSIS — I10 Essential (primary) hypertension: Secondary | ICD-10-CM | POA: Diagnosis not present

## 2017-03-18 DIAGNOSIS — J449 Chronic obstructive pulmonary disease, unspecified: Secondary | ICD-10-CM | POA: Diagnosis not present

## 2017-03-18 DIAGNOSIS — K219 Gastro-esophageal reflux disease without esophagitis: Secondary | ICD-10-CM | POA: Diagnosis not present

## 2017-03-18 DIAGNOSIS — N185 Chronic kidney disease, stage 5: Secondary | ICD-10-CM | POA: Diagnosis not present

## 2017-03-18 DIAGNOSIS — E46 Unspecified protein-calorie malnutrition: Secondary | ICD-10-CM | POA: Diagnosis not present

## 2017-03-24 ENCOUNTER — Other Ambulatory Visit: Payer: Self-pay | Admitting: Family Medicine

## 2017-04-04 ENCOUNTER — Emergency Department (HOSPITAL_COMMUNITY)

## 2017-04-04 ENCOUNTER — Inpatient Hospital Stay (HOSPITAL_COMMUNITY)
Admission: EM | Admit: 2017-04-04 | Discharge: 2017-04-06 | DRG: 291 | Disposition: A | Discharge status: Home / Self Care | Attending: Family Medicine | Admitting: Family Medicine

## 2017-04-04 ENCOUNTER — Other Ambulatory Visit: Payer: Self-pay

## 2017-04-04 DIAGNOSIS — J449 Chronic obstructive pulmonary disease, unspecified: Secondary | ICD-10-CM | POA: Diagnosis present

## 2017-04-04 DIAGNOSIS — J9601 Acute respiratory failure with hypoxia: Secondary | ICD-10-CM | POA: Diagnosis not present

## 2017-04-04 DIAGNOSIS — R069 Unspecified abnormalities of breathing: Secondary | ICD-10-CM | POA: Diagnosis not present

## 2017-04-04 DIAGNOSIS — I130 Hypertensive heart and chronic kidney disease with heart failure and stage 1 through stage 4 chronic kidney disease, or unspecified chronic kidney disease: Secondary | ICD-10-CM | POA: Diagnosis not present

## 2017-04-04 DIAGNOSIS — R0602 Shortness of breath: Secondary | ICD-10-CM | POA: Diagnosis present

## 2017-04-04 DIAGNOSIS — Z515 Encounter for palliative care: Secondary | ICD-10-CM

## 2017-04-04 DIAGNOSIS — Z9181 History of falling: Secondary | ICD-10-CM

## 2017-04-04 DIAGNOSIS — R748 Abnormal levels of other serum enzymes: Secondary | ICD-10-CM | POA: Diagnosis present

## 2017-04-04 DIAGNOSIS — Z8673 Personal history of transient ischemic attack (TIA), and cerebral infarction without residual deficits: Secondary | ICD-10-CM

## 2017-04-04 DIAGNOSIS — Z79899 Other long term (current) drug therapy: Secondary | ICD-10-CM

## 2017-04-04 DIAGNOSIS — I5033 Acute on chronic diastolic (congestive) heart failure: Secondary | ICD-10-CM | POA: Diagnosis present

## 2017-04-04 DIAGNOSIS — M1991 Primary osteoarthritis, unspecified site: Secondary | ICD-10-CM | POA: Diagnosis present

## 2017-04-04 DIAGNOSIS — Z885 Allergy status to narcotic agent status: Secondary | ICD-10-CM

## 2017-04-04 DIAGNOSIS — R8271 Bacteriuria: Secondary | ICD-10-CM | POA: Diagnosis present

## 2017-04-04 DIAGNOSIS — K219 Gastro-esophageal reflux disease without esophagitis: Secondary | ICD-10-CM | POA: Diagnosis present

## 2017-04-04 DIAGNOSIS — G8929 Other chronic pain: Secondary | ICD-10-CM | POA: Diagnosis present

## 2017-04-04 DIAGNOSIS — C519 Malignant neoplasm of vulva, unspecified: Secondary | ICD-10-CM | POA: Diagnosis present

## 2017-04-04 DIAGNOSIS — N184 Chronic kidney disease, stage 4 (severe): Secondary | ICD-10-CM | POA: Diagnosis present

## 2017-04-04 DIAGNOSIS — I2510 Atherosclerotic heart disease of native coronary artery without angina pectoris: Secondary | ICD-10-CM | POA: Diagnosis present

## 2017-04-04 DIAGNOSIS — M545 Low back pain: Secondary | ICD-10-CM | POA: Diagnosis present

## 2017-04-04 DIAGNOSIS — Z87891 Personal history of nicotine dependence: Secondary | ICD-10-CM

## 2017-04-04 DIAGNOSIS — L299 Pruritus, unspecified: Secondary | ICD-10-CM | POA: Diagnosis present

## 2017-04-04 DIAGNOSIS — Z66 Do not resuscitate: Secondary | ICD-10-CM | POA: Diagnosis present

## 2017-04-04 DIAGNOSIS — N179 Acute kidney failure, unspecified: Secondary | ICD-10-CM | POA: Diagnosis present

## 2017-04-04 DIAGNOSIS — M810 Age-related osteoporosis without current pathological fracture: Secondary | ICD-10-CM | POA: Diagnosis present

## 2017-04-04 DIAGNOSIS — E785 Hyperlipidemia, unspecified: Secondary | ICD-10-CM | POA: Diagnosis present

## 2017-04-04 DIAGNOSIS — Z880 Allergy status to penicillin: Secondary | ICD-10-CM

## 2017-04-04 DIAGNOSIS — Z8541 Personal history of malignant neoplasm of cervix uteri: Secondary | ICD-10-CM

## 2017-04-04 DIAGNOSIS — I252 Old myocardial infarction: Secondary | ICD-10-CM

## 2017-04-04 DIAGNOSIS — Z923 Personal history of irradiation: Secondary | ICD-10-CM

## 2017-04-04 DIAGNOSIS — M549 Dorsalgia, unspecified: Secondary | ICD-10-CM

## 2017-04-04 LAB — CBC WITH DIFFERENTIAL/PLATELET
BASOS PCT: 0 %
Basophils Absolute: 0 10*3/uL (ref 0.0–0.1)
EOS ABS: 0.1 10*3/uL (ref 0.0–0.7)
Eosinophils Relative: 1 %
HCT: 35.7 % — ABNORMAL LOW (ref 36.0–46.0)
HEMOGLOBIN: 11.4 g/dL — AB (ref 12.0–15.0)
LYMPHS ABS: 0.5 10*3/uL — AB (ref 0.7–4.0)
Lymphocytes Relative: 7 %
MCH: 27.7 pg (ref 26.0–34.0)
MCHC: 31.9 g/dL (ref 30.0–36.0)
MCV: 86.7 fL (ref 78.0–100.0)
Monocytes Absolute: 0.5 10*3/uL (ref 0.1–1.0)
Monocytes Relative: 6 %
NEUTROS PCT: 86 %
Neutro Abs: 6.3 10*3/uL (ref 1.7–7.7)
Platelets: 232 10*3/uL (ref 150–400)
RBC: 4.12 MIL/uL (ref 3.87–5.11)
RDW: 14.1 % (ref 11.5–15.5)
WBC: 7.3 10*3/uL (ref 4.0–10.5)

## 2017-04-04 LAB — COMPREHENSIVE METABOLIC PANEL
ALK PHOS: 77 U/L (ref 38–126)
ALT: 11 U/L — ABNORMAL LOW (ref 14–54)
ANION GAP: 6 (ref 5–15)
AST: 18 U/L (ref 15–41)
Albumin: 3.3 g/dL — ABNORMAL LOW (ref 3.5–5.0)
BUN: 38 mg/dL — ABNORMAL HIGH (ref 6–20)
CALCIUM: 8.8 mg/dL — AB (ref 8.9–10.3)
CO2: 24 mmol/L (ref 22–32)
Chloride: 107 mmol/L (ref 101–111)
Creatinine, Ser: 2.73 mg/dL — ABNORMAL HIGH (ref 0.44–1.00)
GFR, EST AFRICAN AMERICAN: 17 mL/min — AB (ref >60)
GFR, EST NON AFRICAN AMERICAN: 14 mL/min — AB (ref >60)
Glucose, Bld: 105 mg/dL — ABNORMAL HIGH (ref 65–99)
Potassium: 4.4 mmol/L (ref 3.5–5.1)
SODIUM: 137 mmol/L (ref 135–145)
TOTAL PROTEIN: 6 g/dL — AB (ref 6.5–8.1)
Total Bilirubin: 0.8 mg/dL (ref 0.3–1.2)

## 2017-04-04 LAB — I-STAT TROPONIN, ED: TROPONIN I, POC: 0.1 ng/mL — AB (ref 0.00–0.08)

## 2017-04-04 LAB — URINALYSIS, ROUTINE W REFLEX MICROSCOPIC
Bilirubin Urine: NEGATIVE
GLUCOSE, UA: NEGATIVE mg/dL
Hgb urine dipstick: NEGATIVE
KETONES UR: NEGATIVE mg/dL
Nitrite: NEGATIVE
PH: 5 (ref 5.0–8.0)
Protein, ur: >=300 mg/dL — AB
SPECIFIC GRAVITY, URINE: 1.016 (ref 1.005–1.030)

## 2017-04-04 MED ORDER — DEXTROSE 5 % IV SOLN
1.0000 g | Freq: Once | INTRAVENOUS | Status: AC
  Administered 2017-04-05: 1 g via INTRAVENOUS
  Filled 2017-04-04: qty 10

## 2017-04-04 MED ORDER — FUROSEMIDE 10 MG/ML IJ SOLN
40.0000 mg | Freq: Once | INTRAMUSCULAR | Status: AC
  Administered 2017-04-05: 40 mg via INTRAVENOUS
  Filled 2017-04-04: qty 4

## 2017-04-04 NOTE — H&P (Signed)
[facility] Admission History and Physical Service Pager: [phone]  Patient name: Meredith Bender Medical record number: [id number] Date of birth: 1925-08-08 Age: 83 y.o. Gender: female  Primary Care Provider: [name], MD Consultants: None Code Status: Partial  Chief Complaint: SOB  Assessment and Plan: Meredith Bender is a 83 y.o. female presenting with SOB. PMH is significant for COPD, HTN, CKD stage IV, vulvar carcinoma on Hospice, OA, HFpEF, HLD, GERD, h/o CVA  Dyspnea: Patient reported that SOB began earlier today. Feels as if she couldn't take a deep breath. Occurs even at rest, improved with sitting up on edge of bed. On 2 L [place] breathing comfortably with pulse ox of 90%. Lungs were clear. CXR shows Cardiac enlargement with interstitial infiltrates that could be c/w edema vs pneumonitis. Suspect interstitial edema. BNP >4,500. Wells score for PE 4. Elevated troponin of 0.10- suggestive of strain from CHF. Also with elevated trop at last admission. No EKG changes and no chest pain. Given IV 40 mg of lasix in ED. SOB possibly due to CHF exacerbation vs PE vs URI vs COPD exacerbation  - Admit for observation, attending Dr. Mingo Amber - Continuous pulse ox - Vitals per unit - Well's Criteria: 4.0  - Obtain D-Dimer and V/Q scan to r/o PE - Repeat troponin - CBC, BMP in AM  HfpEF: Patient on home Lasix of 40 mg 3 times a week as needed for edema. BNP >4,500. CXR shows interstitial infiltrates that could be c/w edema vs pneumonitis. Suspect interstitial edema. - Strict I/O's - Monitor weight - S/p  40 mg IV of lasix in ED  COPD: Not on any home oxygen, has home oxygen in case it is needed - On 2L [place] here satting 90%. - Monitor  HTN: On Hydralazine 25 mg TID at home. Hypertensive since admission with last BP at 183/104, has not had her dose of hydralazine tonight - Continue home medications and monitor  CKD Stage IV: UA shows possible UTI. However,  patient asymptomatic. Received one dose CTX in ED.  - Consider repeat UA if patient becomes symptomatic - Monitor  - Repeat BMP in morning  Primary Vulvar Carcinoma: Patient received last dose of radiation treatment on 01/10/2017. Patient on Hospice.  Pruritic Rash: Patient reports that she has had a rash that is shingles since June. She reports that she was treated but it has not gone away. She is constantly scratching her arms during the exam. She has not tried anything other than oral steroids in June for the rash. Does not appear to be shingles, appears more eczematous.  - Hydrocortisone cream 0.5% for rash on arms - Hydroxyzine 10 mg for itching  OA: Stable. - Patient takes tramadol for chronic pain from OA.  H/o HLD: not on any home medications  H/o CVA: Control blood pressure  GERD: Stable. - continue home omeprazole  FEN/GI: Regular diet Prophylaxis: Heparin  Disposition: Place in observation  History of Present Illness:  Meredith Bender is a 83 y.o. female presenting with SOB.   Patient presented to the ED upon advice of PCP who had been called earlier due to patient complaining of back and abdominal pain. Upon arrival to the ED, patient reporting that she has had no abdominal pain today, but she did have back pain previously, which resolved after taking tramadol. She reports that she is here now for SOB only.   Patient reports SOB beginning earlier today. Feels as if she couldn't take a  deep breath. Occurs even at rest, however has improved now that she is comfortable in ED bed. Prior to ED presentation symptoms were worse when laying down. Sitting on side of bed improved her symptoms. Has supplemental O2 at home but does not use it regularly. Says it is only precautionary. Brought home O2 tank with her to ED and was placed on 2L. Says symptoms have improved since being placed on supplemental O2. Does not have any inhalers or any other type of breathing medication at home.  Endorses non-productive cough, HA and nasal congestion today. Reports chest "fullness," and describes this by saying "if I could sit on the side of the bed I could breathe better."  Had back pain earlier today but took Tramadol and says that has resolved. Denies any abdominal pain.   Diagnosed with shingles primarily on upper extremities in 12/2016 but denies any other recent illnesses. Also has metastatic cancer. Says she is taking 1-2 Tramadol per day for pain. Is no longer receiving palliative radiation treatments.     Review Of Systems: Per HPI with the following additions:   Review of Systems  Constitutional: Negative for chills and fever.  HENT: Positive for congestion.   Eyes: Negative for blurred vision and double vision.  Respiratory: Positive for cough and shortness of breath.   Cardiovascular: Negative for chest pain.  Gastrointestinal: Negative for abdominal pain, constipation, diarrhea, nausea and vomiting.  Genitourinary: Negative for dysuria, frequency, hematuria and urgency.  Musculoskeletal: Negative for back pain.  Skin: Positive for rash (To upper extremities).  Neurological: Positive for headaches.    Patient Active Problem List   Diagnosis Date Noted  . Metastatic cancer ([place])   . Abnormality of gait 03/25/2016  . CKD (chronic kidney disease) stage 4, GFR 15-29 ml/min (H[facility]) 03/14/2016  . Acute on chronic kidney failure ([place])   . Hx of Cerebellar hemorrhage ([place])   . 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 Primary vulvar cancer ([place]) 03/19/2015  . Urethral lesion 03/06/2015  . s/p hysterectomy for prolapsed uterus 02/25/2015  . History of repair for rectocele 02/25/2015  . Living accommodation issues 12/18/2014  . Knee pain, bilateral 01/09/2014  . Rotator cuff syndrome of both shoulders 01/09/2014  . Arthritis 12/02/2013  . DNR (do not resuscitate) discussion 12/02/2013  . Chronic diastolic CHF (congestive heart failure) ([place]) 08/31/2013  . Intertrochanteric fracture  of right hip ([place]) 08/29/2013  . Routine health maintenance 06/12/2013  . Renal insufficiency 02/28/2013  . Dyslipidemia 02/28/2013  . Benign hypertension with CKD (chronic kidney disease) stage IV [facility])     Past Medical History: Past Medical History:  Diagnosis Date  . Anginal pain ([place])   . Arthritis   . Blood transfusion without reported diagnosis   . Cataract   . Cervical cancer ([place])   . Chronic kidney disease   . COPD (chronic obstructive pulmonary disease) ([place])   . CVA (cerebral infarction)    Remote bilateral cerebellar infarcts on CT 8/14  . Dyslipidemia 02/28/2013  . GERD (gastroesophageal reflux disease)   . History of radiation therapy 12/27/16-01/10/17   right inguinal pelvis 30 Gy in 10 fractions  . Hypertension   . Intertrochanteric fracture of right femur ([place])   . Myocardial infarction ([place])   . Osteoporosis   . Radiation 04/02/15-05/29/15   vulvar region, inguinal lymph node 60 Gu  . Stroke (cerebrum) ([place]) 01/2016  . Stroke [facility]) 1978   no deficits  . Vulvar cancer [facility])     Past Surgical History: Past Surgical  History:  Procedure Laterality Date  . ABDOMINAL HYSTERECTOMY    . APPENDECTOMY    . HERNIA REPAIR    . INTRAMEDULLARY (IM) NAIL INTERTROCHANTERIC Right 08/29/2013   Procedure: INTRAMEDULLARY (IM) NAIL INTERTROCHANTRIC;  Surgeon: Nita Sells, MD;  Location: [facility];  Service: Orthopedics;  Laterality: Right;  . TUBAL LIGATION      Social History: Social History  Substance Use Topics  . Smoking status: Never Smoker  . Smokeless tobacco: Former Systems developer    Types: Snuff  . Alcohol use No   Additional social history: Lives home alone. Has children that live nearby.   Please also refer to relevant sections of EMR.  Family History: Family History  Problem Relation Age of Onset  . Diabetes Mother   . Uterine cancer Sister      Allergies and Medications: Allergies  Allergen Reactions  . Codeine Nausea Only  . Penicillins Rash     Pt does not remember how long ago or severity of reaction.    No current facility-administered medications on file prior to encounter.    Current Outpatient Prescriptions on File Prior to Encounter  Medication Sig Dispense Refill  . acetaminophen (TYLENOL) 325 MG tablet Take 650 mg by mouth every 6 (six) hours as needed.    . Cholecalciferol (VITAMIN D3) 3000 units TABS Take 3,000 Units by mouth daily.     . furosemide (LASIX) 40 MG tablet Take one tab by mouth three times a week MWF as needed for fluid overload 30 tablet 3  . hydrALAZINE (APRESOLINE) 25 MG tablet Take 1 tablet (25 mg total) by mouth 3 (three) times daily. (Patient not taking: Reported on 12/14/2016)    . hydrALAZINE (APRESOLINE) 25 MG tablet TAKE 1 TABLET(25 MG) BY MOUTH THREE TIMES DAILY 270 tablet 0  . loratadine (CLARITIN) 10 MG tablet Take 1 tablet (10 mg total) by mouth daily. 90 tablet 3  . omeprazole (PRILOSEC) 20 MG capsule Take 1 capsule (20 mg total) by mouth daily. 90 capsule 3  . promethazine (PHENERGAN) 25 MG suppository Place 1 suppository (25 mg total) rectally every 8 (eight) hours as needed for nausea or vomiting. 30 each 3  . traMADol (ULTRAM) 50 MG tablet Take one or two tablets by mouth every eight hours for pain 180 tablet 5  . triamcinolone cream (KENALOG) 0.1 % APPLY TO ITCHY AREAS TWO TO THREE TIMES DAILY AS NEEDED 80 g 0  . verapamil (VERELAN PM) 240 MG 24 hr capsule TAKE 1 CAPSULE BY MOUTH DAILY (Patient not taking: Reported on 12/14/2016) 90 capsule 0  . verapamil (VERELAN PM) 240 MG 24 hr capsule TAKE 1 CAPSULE BY MOUTH DAILY 90 capsule 3  . vitamin E 400 UNIT capsule Take by mouth.      Objective: BP (!) 174/91   Pulse 79   Temp 97.9 F (36.6 C) (Axillary)   Resp 20   SpO2 90%  Exam: General: NAD, pleasant Eyes: PERRL, EOMI, no conjunctival pallor or injection ENTM: Dry mucous membranes, no pharyngeal erythema or exudate Neck: Supple, no LAD Cardiovascular: RRR, no m/r/g, trace RLE edema,  +1 pitting edema on LLE Respiratory: CTA BL, increased work of breathing Gastrointestinal: soft, nontender, nondistended, normoactive BS MSK: moves 4 extremities equally Derm: rash on BLUE and upper back, small excoriations noted from what appears to be from scratching and dry patches  Neuro: CN II-XII grossly intact Psych: AOx3, appropriate affect  Labs and Imaging: CBC BMET   Recent Labs Lab 04/04/17 2103  WBC 7.3  HGB 11.4*  HCT 35.7*  PLT 232    Recent Labs Lab 04/04/17 2103  NA 137  K 4.4  CL 107  CO2 24  BUN 38*  CREATININE 2.73*  GLUCOSE 105*  CALCIUM 8.8Enid Derry, [place], DO 04/05/2017, 12:21 AM PGY-1, [place] Intern pager: [phone], text pages welcome  UPPER LEVEL ADDENDUM  I have read the above note and made revisions highlighted in orange.  Adin Hector, MD, MPH PGY-3 [facility] Pager [phone]

## 2017-04-04 NOTE — ED Notes (Signed)
Patient transported to X-ray 

## 2017-04-04 NOTE — ED Triage Notes (Signed)
Pt from home via [facility]. C/o [facility] & cough that has gotten worse throughout the day. Denies CP, but endorses new onset lower back pain & chronic abd pain. PCP wanted pt to be transported. Hx of COPD, CHF & cancer. Pt is a hospice pt.

## 2017-04-04 NOTE — ED Notes (Signed)
Spoke with Pincus Large with Hospice.  They are aware that pt is here, a hospice RN was out to see pt and spoke with PCP who wanted pt to come to ED for work up.

## 2017-04-04 NOTE — ED Provider Notes (Signed)
[facility] DEPT Provider Note   CSN: [id number] Arrival date & time: 04/04/17  1943     History   Chief Complaint Chief Complaint  Patient presents with  . Shortness of Breath    HPI Meredith Bender is a 83 y.o. female.  The history is provided by the patient.  Shortness of Breath  This is a new problem. The average episode lasts 1 day. The problem occurs continuously.The current episode started 12 to 24 hours ago. The problem has not changed since onset.Associated symptoms include cough. Pertinent negatives include no fever, no rhinorrhea, no sore throat, no sputum production, no chest pain and no leg swelling. It is unknown what precipitated the problem. She has tried nothing for the symptoms. The treatment provided no relief. Associated medical issues include COPD, CAD and heart failure.   83 year old female who presents with shortness of breath. History of COPD, diastolic CHF, HTN, CAD, CVA and vulvar cancer on palliative care. Onset of dyspnea today gradually worsening. Began to use her PRN oxygen due to dyspnea and pulse ox of 82% on room air. Has had non-productive cough, but no fever or chills. No LE edema, orthopnea or PND. No chest pain. Family spoke with Dr. Nori Riis over the phone and it was recommended that she come to the ED for evaluation.   Past Medical History:  Diagnosis Date  . Anginal pain ([place])   . Arthritis   . Blood transfusion without reported diagnosis   . Cataract   . Cervical cancer ([place])   . Chronic kidney disease   . COPD (chronic obstructive pulmonary disease) ([place])   . CVA (cerebral infarction)    Remote bilateral cerebellar infarcts on CT 8/14  . Dyslipidemia 02/28/2013  . GERD (gastroesophageal reflux disease)   . History of radiation therapy 12/27/16-01/10/17   right inguinal pelvis 30 Gy in 10 fractions  . Hypertension   . Intertrochanteric fracture of right femur ([place])   . Myocardial infarction ([place])   . Osteoporosis   . Radiation  04/02/15-05/29/15   vulvar region, inguinal lymph node 60 Gu  . Stroke (cerebrum) ([place]) 01/2016  . Stroke [facility]) 1978   no deficits  . Vulvar cancer [facility])     Patient Active Problem List   Diagnosis Date Noted  . Metastatic cancer ([place])   . Abnormality of gait 03/25/2016  . CKD (chronic kidney disease) stage 4, GFR 15-29 ml/min (HCC) 03/14/2016  . Acute on chronic kidney failure ([place])   . Hx of Cerebellar hemorrhage ([place])   . 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 Primary vulvar cancer ([place]) 03/19/2015  . Urethral lesion 03/06/2015  . s/p hysterectomy for prolapsed uterus 02/25/2015  . History of repair for rectocele 02/25/2015  . Living accommodation issues 12/18/2014  . Knee pain, bilateral 01/09/2014  . Rotator cuff syndrome of both shoulders 01/09/2014  . Arthritis 12/02/2013  . DNR (do not resuscitate) discussion 12/02/2013  . Chronic diastolic CHF (congestive heart failure) ([place]) 08/31/2013  . Intertrochanteric fracture of right hip ([place]) 08/29/2013  . Routine health maintenance 06/12/2013  . Renal insufficiency 02/28/2013  . Dyslipidemia 02/28/2013  . Benign hypertension with CKD (chronic kidney disease) stage IV [facility])     Past Surgical History:  Procedure Laterality Date  . ABDOMINAL HYSTERECTOMY    . APPENDECTOMY    . HERNIA REPAIR    . INTRAMEDULLARY (IM) NAIL INTERTROCHANTERIC Right 08/29/2013   Procedure: INTRAMEDULLARY (IM) NAIL INTERTROCHANTRIC;  Surgeon: Nita Sells, MD;  Location: [facility];  Service:  Orthopedics;  Laterality: Right;  . TUBAL LIGATION      OB History    No data available       Home Medications    Prior to Admission medications   Medication Sig Start Date End Date Taking? Authorizing Provider  acetaminophen (TYLENOL) 325 MG tablet Take 650 mg by mouth every 6 (six) hours as needed.    [provider]  Cholecalciferol (VITAMIN D3) 3000 units TABS Take 3,000 Units by mouth daily.     [provider]  furosemide (LASIX)  40 MG tablet Take one tab by mouth three times a week MWF as needed for fluid overload 12/01/16   Zenia Resides, MD  hydrALAZINE (APRESOLINE) 25 MG tablet Take 1 tablet (25 mg total) by mouth 3 (three) times daily. Patient not taking: Reported on 12/14/2016 10/12/16   [name], MD  hydrALAZINE (APRESOLINE) 25 MG tablet TAKE 1 TABLET(25 MG) BY MOUTH THREE TIMES DAILY 11/28/16   [name], MD  loratadine (CLARITIN) 10 MG tablet Take 1 tablet (10 mg total) by mouth daily. 12/17/14   [name], MD  omeprazole (PRILOSEC) 20 MG capsule Take 1 capsule (20 mg total) by mouth daily. 03/31/16   Zenia Resides, MD  promethazine (PHENERGAN) 25 MG suppository Place 1 suppository (25 mg total) rectally every 8 (eight) hours as needed for nausea or vomiting. 01/02/17   [name], MD  traMADol Veatrice [place]) 50 MG tablet Take one or two tablets by mouth every eight hours for pain 12/30/16   [name], MD  triamcinolone cream (KENALOG) 0.1 % APPLY TO ITCHY AREAS TWO TO THREE TIMES DAILY AS NEEDED 03/24/17   [name], MD  verapamil (VERELAN PM) 240 MG 24 hr capsule TAKE 1 CAPSULE BY MOUTH DAILY Patient not taking: Reported on 12/14/2016 02/22/16   [name], MD  verapamil (VERELAN PM) 240 MG 24 hr capsule TAKE 1 CAPSULE BY MOUTH DAILY 12/07/16   [name], MD  vitamin E 400 UNIT capsule Take by mouth.    [provider]    Family History Family History  Problem Relation Age of Onset  . Diabetes Mother   . Uterine cancer Sister     Social History Social History  Substance Use Topics  . Smoking status: Never Smoker  . Smokeless tobacco: Former Systems developer    Types: Snuff  . Alcohol use No     Allergies   Codeine and Penicillins   Review of Systems Review of Systems  Constitutional: Negative for fever.  HENT: Negative for rhinorrhea and sore throat.   Respiratory: Positive for cough and shortness of breath. Negative for sputum production.   Cardiovascular: Negative for chest pain  and leg swelling.  All other systems reviewed and are negative.    Physical Exam Updated Vital Signs BP (!) 174/91   Pulse 79   Temp 97.9 F (36.6 C) (Axillary)   Resp 20   SpO2 90%   Physical Exam Physical Exam  Nursing note and vitals reviewed. Constitutional: Chronically ill, malnourished appearing, non-toxic, and in no acute distress Head: Normocephalic and atraumatic.  Mouth/Throat: Oropharynx is clear and moist.  Neck: Normal range of motion. Neck supple.  Cardiovascular: Normal rate and regular rhythm.   Pulmonary/Chest: Effort normal and breath sounds normal.  Abdominal: Soft. There is no tenderness. There is no rebound and no guarding.  Musculoskeletal: Normal range of motion. no edema Neurological: Alert, no facial droop, fluent speech, moves  all extremities symmetrically Skin: Skin is warm and dry.  Psychiatric: Cooperative   ED Treatments / Results  Labs (all labs ordered are listed, but only abnormal results are displayed) Labs Reviewed  CBC WITH DIFFERENTIAL/PLATELET - Abnormal; Notable for the following:       Result Value   Hemoglobin 11.4 (*)    HCT 35.7 (*)    Lymphs Abs 0.5 (*)    All other components within normal limits  COMPREHENSIVE METABOLIC PANEL - Abnormal; Notable for the following:    Glucose, Bld 105 (*)    BUN 38 (*)    Creatinine, Ser 2.73 (*)    Calcium 8.8 (*)    Total Protein 6.0 (*)    Albumin 3.3 (*)    ALT 11 (*)    GFR calc non Af Amer 14 (*)    GFR calc Af Amer 17 (*)    All other components within normal limits  URINALYSIS, ROUTINE W REFLEX MICROSCOPIC - Abnormal; Notable for the following:    APPearance CLOUDY (*)    Protein, ur >=300 (*)    Leukocytes, UA LARGE (*)    Bacteria, UA MANY (*)    Squamous Epithelial / LPF 0-5 (*)    All other components within normal limits  I-STAT TROPONIN, ED - Abnormal; Notable for the following:    Troponin i, poc 0.10 (*)    All other components within normal limits  URINE CULTURE   BRAIN NATRIURETIC PEPTIDE    EKG  EKG Interpretation  Date/Time:  Tuesday April 04 2017 21:07:04 EDT Ventricular Rate:  81 PR Interval:    QRS Duration: 108 QT Interval:  396 QTC Calculation: 460 R Axis:   -30 Text Interpretation:  Sinus rhythm Atrial premature complex Probable left atrial enlargement Abnormal R-wave progression, early transition LVH with secondary repolarization abnormality similar to previous EKG  Confirmed by Brantley Stage [id number]) on 04/04/2017 9:38:55 PM       Radiology Dg Chest 2 View  Result Date: 04/04/2017 CLINICAL DATA:  History of COPD. CHF with hypoxia, cough, shortness of breath for 1 day. EXAM: CHEST  2 VIEW COMPARISON:  06/19/2016 FINDINGS: Cardiac enlargement. No pulmonary vascular congestion. Probable emphysematous changes in the lungs. Diffuse interstitial pattern may indicate edema. Interstitial pneumonia not excluded. Bilateral pleural effusions with basilar atelectasis. No pneumothorax. Calcified and tortuous aorta. Old right rib fractures. Examination is technically limited due to patient rotation. IMPRESSION: Cardiac enlargement. Mild diffuse interstitial pattern may indicate edema or interstitial pneumonitis. Small bilateral pleural effusions with basilar atelectasis. Aortic atherosclerosis. Electronically Signed   By: Lucienne Capers M.D.   On: 04/04/2017 23:00    Procedures Procedures (including critical care time)  Medications Ordered in ED Medications  cefTRIAXone (ROCEPHIN) 1 g in dextrose 5 % 50 mL IVPB (not administered)  furosemide (LASIX) injection 40 mg (not administered)     Initial Impression / Assessment and Plan / ED Course  I have reviewed the triage vital signs and the nursing notes.  Pertinent labs & imaging results that were available during my care of the patient were reviewed by me and considered in my medical decision making (see chart for details).     With acute hypoxic respiratory failure. On 2 L is breathing  comfortably and with pulse ox 90-95%. Does not look fluid overloaded. Lungs are clear. CXR visualized and shows interstitial infiltrates that could be c/w edema vs pneumonitis. Suspect possible interstitial edema. BNP pending. Elevated troponin of 0.10 which is suggestive of strain  from CHF. No EKG changes and chest pain free. Baseline CKD. UA shows possible UTI. Given ceftraxone and lasix. Discussed with family medicine service who will admit.   Final Clinical Impressions(s) / ED Diagnoses   Final diagnoses:  Acute respiratory failure with hypoxia [facility])    New Prescriptions New Prescriptions   No medications on file     Forde Dandy, MD 04/05/17 [phone]

## 2017-04-04 NOTE — ED Notes (Signed)
Called [facility] to notify them that pt is here

## 2017-04-05 ENCOUNTER — Encounter (HOSPITAL_COMMUNITY): Payer: Self-pay

## 2017-04-05 ENCOUNTER — Observation Stay (HOSPITAL_COMMUNITY)

## 2017-04-05 DIAGNOSIS — L299 Pruritus, unspecified: Secondary | ICD-10-CM | POA: Diagnosis present

## 2017-04-05 DIAGNOSIS — C774 Secondary and unspecified malignant neoplasm of inguinal and lower limb lymph nodes: Secondary | ICD-10-CM | POA: Diagnosis not present

## 2017-04-05 DIAGNOSIS — C519 Malignant neoplasm of vulva, unspecified: Secondary | ICD-10-CM | POA: Diagnosis not present

## 2017-04-05 DIAGNOSIS — R0602 Shortness of breath: Secondary | ICD-10-CM | POA: Diagnosis present

## 2017-04-05 DIAGNOSIS — Z8541 Personal history of malignant neoplasm of cervix uteri: Secondary | ICD-10-CM | POA: Diagnosis not present

## 2017-04-05 DIAGNOSIS — I252 Old myocardial infarction: Secondary | ICD-10-CM | POA: Diagnosis not present

## 2017-04-05 DIAGNOSIS — I5033 Acute on chronic diastolic (congestive) heart failure: Secondary | ICD-10-CM | POA: Diagnosis present

## 2017-04-05 DIAGNOSIS — Z515 Encounter for palliative care: Secondary | ICD-10-CM

## 2017-04-05 DIAGNOSIS — Z885 Allergy status to narcotic agent status: Secondary | ICD-10-CM | POA: Diagnosis not present

## 2017-04-05 DIAGNOSIS — K219 Gastro-esophageal reflux disease without esophagitis: Secondary | ICD-10-CM | POA: Diagnosis present

## 2017-04-05 DIAGNOSIS — J9601 Acute respiratory failure with hypoxia: Secondary | ICD-10-CM

## 2017-04-05 DIAGNOSIS — M545 Low back pain: Secondary | ICD-10-CM | POA: Diagnosis not present

## 2017-04-05 DIAGNOSIS — N184 Chronic kidney disease, stage 4 (severe): Secondary | ICD-10-CM | POA: Diagnosis present

## 2017-04-05 DIAGNOSIS — N179 Acute kidney failure, unspecified: Secondary | ICD-10-CM | POA: Diagnosis present

## 2017-04-05 DIAGNOSIS — I10 Essential (primary) hypertension: Secondary | ICD-10-CM | POA: Diagnosis not present

## 2017-04-05 DIAGNOSIS — J449 Chronic obstructive pulmonary disease, unspecified: Secondary | ICD-10-CM | POA: Diagnosis present

## 2017-04-05 DIAGNOSIS — Z66 Do not resuscitate: Secondary | ICD-10-CM | POA: Diagnosis present

## 2017-04-05 DIAGNOSIS — E785 Hyperlipidemia, unspecified: Secondary | ICD-10-CM | POA: Diagnosis present

## 2017-04-05 DIAGNOSIS — I2510 Atherosclerotic heart disease of native coronary artery without angina pectoris: Secondary | ICD-10-CM | POA: Diagnosis present

## 2017-04-05 DIAGNOSIS — Z880 Allergy status to penicillin: Secondary | ICD-10-CM | POA: Diagnosis not present

## 2017-04-05 DIAGNOSIS — N185 Chronic kidney disease, stage 5: Secondary | ICD-10-CM | POA: Diagnosis not present

## 2017-04-05 DIAGNOSIS — E46 Unspecified protein-calorie malnutrition: Secondary | ICD-10-CM | POA: Diagnosis not present

## 2017-04-05 DIAGNOSIS — I509 Heart failure, unspecified: Secondary | ICD-10-CM | POA: Diagnosis not present

## 2017-04-05 DIAGNOSIS — R8271 Bacteriuria: Secondary | ICD-10-CM | POA: Diagnosis present

## 2017-04-05 DIAGNOSIS — Z79899 Other long term (current) drug therapy: Secondary | ICD-10-CM | POA: Diagnosis not present

## 2017-04-05 DIAGNOSIS — M1991 Primary osteoarthritis, unspecified site: Secondary | ICD-10-CM | POA: Diagnosis present

## 2017-04-05 DIAGNOSIS — Z8673 Personal history of transient ischemic attack (TIA), and cerebral infarction without residual deficits: Secondary | ICD-10-CM | POA: Diagnosis not present

## 2017-04-05 DIAGNOSIS — I130 Hypertensive heart and chronic kidney disease with heart failure and stage 1 through stage 4 chronic kidney disease, or unspecified chronic kidney disease: Secondary | ICD-10-CM | POA: Diagnosis present

## 2017-04-05 DIAGNOSIS — R748 Abnormal levels of other serum enzymes: Secondary | ICD-10-CM | POA: Diagnosis present

## 2017-04-05 DIAGNOSIS — G8929 Other chronic pain: Secondary | ICD-10-CM | POA: Diagnosis not present

## 2017-04-05 DIAGNOSIS — M810 Age-related osteoporosis without current pathological fracture: Secondary | ICD-10-CM | POA: Diagnosis present

## 2017-04-05 LAB — BASIC METABOLIC PANEL
Anion gap: 11 (ref 5–15)
BUN: 35 mg/dL — AB (ref 6–20)
CALCIUM: 9 mg/dL (ref 8.9–10.3)
CO2: 21 mmol/L — AB (ref 22–32)
CREATININE: 2.88 mg/dL — AB (ref 0.44–1.00)
Chloride: 106 mmol/L (ref 101–111)
GFR calc Af Amer: 16 mL/min — ABNORMAL LOW (ref >60)
GFR calc non Af Amer: 13 mL/min — ABNORMAL LOW (ref >60)
GLUCOSE: 96 mg/dL (ref 65–99)
Potassium: 3.9 mmol/L (ref 3.5–5.1)
Sodium: 138 mmol/L (ref 135–145)

## 2017-04-05 LAB — D-DIMER, QUANTITATIVE: D-Dimer, Quant: 0.88 ug/mL-FEU — ABNORMAL HIGH (ref 0.00–0.50)

## 2017-04-05 LAB — CBC
HCT: 38.4 % (ref 36.0–46.0)
Hemoglobin: 12.1 g/dL (ref 12.0–15.0)
MCH: 27.3 pg (ref 26.0–34.0)
MCHC: 31.5 g/dL (ref 30.0–36.0)
MCV: 86.7 fL (ref 78.0–100.0)
PLATELETS: 290 10*3/uL (ref 150–400)
RBC: 4.43 MIL/uL (ref 3.87–5.11)
RDW: 14.2 % (ref 11.5–15.5)
WBC: 8.6 10*3/uL (ref 4.0–10.5)

## 2017-04-05 LAB — C-REACTIVE PROTEIN: CRP: 5.3 mg/dL — AB (ref <1.0)

## 2017-04-05 LAB — BRAIN NATRIURETIC PEPTIDE

## 2017-04-05 LAB — TROPONIN I: TROPONIN I: 0.1 ng/mL — AB (ref <0.03)

## 2017-04-05 MED ORDER — PANTOPRAZOLE SODIUM 40 MG PO TBEC
40.0000 mg | DELAYED_RELEASE_TABLET | Freq: Every day | ORAL | Status: DC
  Administered 2017-04-05 – 2017-04-06 (×2): 40 mg via ORAL
  Filled 2017-04-05 (×2): qty 1

## 2017-04-05 MED ORDER — LORATADINE 10 MG PO TABS
10.0000 mg | ORAL_TABLET | Freq: Every day | ORAL | Status: DC
  Administered 2017-04-05 – 2017-04-06 (×2): 10 mg via ORAL
  Filled 2017-04-05 (×2): qty 1

## 2017-04-05 MED ORDER — ORAL CARE MOUTH RINSE
15.0000 mL | Freq: Two times a day (BID) | OROMUCOSAL | Status: DC
  Administered 2017-04-05 – 2017-04-06 (×3): 15 mL via OROMUCOSAL

## 2017-04-05 MED ORDER — INFLUENZA VAC SPLIT HIGH-DOSE 0.5 ML IM SUSY
0.5000 mL | PREFILLED_SYRINGE | INTRAMUSCULAR | Status: DC
  Filled 2017-04-05: qty 0.5

## 2017-04-05 MED ORDER — HYDROXYZINE HCL 10 MG PO TABS
10.0000 mg | ORAL_TABLET | Freq: Three times a day (TID) | ORAL | Status: DC | PRN
  Administered 2017-04-05 – 2017-04-06 (×2): 10 mg via ORAL
  Filled 2017-04-05 (×4): qty 1

## 2017-04-05 MED ORDER — HEPARIN SODIUM (PORCINE) 5000 UNIT/ML IJ SOLN
5000.0000 [IU] | Freq: Three times a day (TID) | INTRAMUSCULAR | Status: DC
  Administered 2017-04-05 (×3): 5000 [IU] via SUBCUTANEOUS
  Filled 2017-04-05 (×3): qty 1

## 2017-04-05 MED ORDER — VERAPAMIL HCL ER 240 MG PO TBCR
240.0000 mg | EXTENDED_RELEASE_TABLET | Freq: Every day | ORAL | Status: DC
  Administered 2017-04-05 – 2017-04-06 (×2): 240 mg via ORAL
  Filled 2017-04-05 (×2): qty 1

## 2017-04-05 MED ORDER — TECHNETIUM TC 99M DIETHYLENETRIAME-PENTAACETIC ACID
31.0000 | Freq: Once | INTRAVENOUS | Status: AC | PRN
  Administered 2017-04-05: 31 via INTRAVENOUS

## 2017-04-05 MED ORDER — TRAMADOL HCL 50 MG PO TABS
50.0000 mg | ORAL_TABLET | Freq: Three times a day (TID) | ORAL | Status: DC | PRN
  Administered 2017-04-05: 50 mg via ORAL
  Filled 2017-04-05: qty 1

## 2017-04-05 MED ORDER — ACETAMINOPHEN 325 MG PO TABS: 650.0000 mg | ORAL_TABLET | Freq: Four times a day (QID) | ORAL | Status: DC | PRN

## 2017-04-05 MED ORDER — ONDANSETRON HCL 4 MG/2ML IJ SOLN
4.0000 mg | Freq: Three times a day (TID) | INTRAMUSCULAR | Status: DC | PRN
  Administered 2017-04-05: 4 mg via INTRAVENOUS
  Filled 2017-04-05: qty 2

## 2017-04-05 MED ORDER — HYDRALAZINE HCL 25 MG PO TABS
25.0000 mg | ORAL_TABLET | Freq: Three times a day (TID) | ORAL | Status: DC
  Administered 2017-04-05 – 2017-04-06 (×5): 25 mg via ORAL
  Filled 2017-04-05 (×5): qty 1

## 2017-04-05 MED ORDER — TECHNETIUM TO 99M ALBUMIN AGGREGATED
4.0000 | Freq: Once | INTRAVENOUS | Status: AC | PRN
  Administered 2017-04-05: 4 via INTRAVENOUS

## 2017-04-05 MED ORDER — VITAMIN D 1000 UNITS PO TABS
3000.0000 [IU] | ORAL_TABLET | Freq: Every day | ORAL | Status: DC
Start: 2017-04-05 — End: 2017-04-06
  Administered 2017-04-05 – 2017-04-06 (×2): 3000 [IU] via ORAL
  Filled 2017-04-05 (×2): qty 3

## 2017-04-05 MED ORDER — VITAMIN E 180 MG (400 UNIT) PO CAPS
400.0000 [IU] | ORAL_CAPSULE | Freq: Every day | ORAL | Status: DC
  Administered 2017-04-05 – 2017-04-06 (×2): 400 [IU] via ORAL
  Filled 2017-04-05 (×2): qty 1

## 2017-04-05 MED ORDER — HYDROCORTISONE 0.5 % EX CREA
TOPICAL_CREAM | Freq: Three times a day (TID) | CUTANEOUS | Status: DC
  Administered 2017-04-05: 16:00:00 via TOPICAL
  Administered 2017-04-05 – 2017-04-06 (×2): 1 via TOPICAL
  Filled 2017-04-05 (×2): qty 28.35

## 2017-04-05 NOTE — Care Management Note (Signed)
Case Management Note  Patient Details  Name: Meredith Bender MRN: [id number] Date of Birth: 10/06/1925  Subjective/Objective:   Pt admitted with SOB. She is from home alone. She is followed by [facility]. She has oxygen at home that she uses PRN.            Action/Plan: Plan is for her to return home when medically ready with [facility]. CM following for d/c needs, physician orders.    Expected Discharge Date:                  Expected Discharge Plan:  Home w Hospice Care  In-House Referral:     Discharge planning Services  CM Consult  Post Acute Care Choice:    Choice offered to:     DME Arranged:    DME Agency:     HH Arranged:    [place] Agency:     Status of Service:  In process, will continue to follow  If discussed at Long Length of Stay Meetings, dates discussed:    Additional Comments:  Pollie Friar, RN 04/05/2017, 1:12 PM

## 2017-04-05 NOTE — Progress Notes (Signed)
[place]- [facility]-GIP RN visit @ [phone].   This is a related and covered GIP admission on 04/05/2017 with a  [facility] diagnosis of Vulvar cancer, per Dr. Lyman Speller.  Patient has [place] DNR. Hospice nurse activated EMS on the advice of Dr. Nori Riis for patient symptoms increased dyspnea and O2 sats of 82-83% on oxygen. Patient was brought to ED and then admitted.  Spoke with patient and family at bedside. Patient is alert and oriented. Denies pain and states she is breathing better.  No distress noted.  V/Q scan pending. Family believes patient will go home later today.   Receiving O2 2L [place]. IV saline lock RAC. PRN medications: Atarax 10mg  TID PRN for itching, Zofran 4mg  IV PRN, tramadol 50-100mg  TID PRN.   Dr. Lyman Speller and Dr. Nori Riis notified of admission. Transfer Summary and medication list placed on shadow chart.   Will continue to follow while hospitalized and anticipate discharge needs.   Thank you  [facility] Liaison  [phone]   All Hospital Liaisons are on AMION

## 2017-04-05 NOTE — Progress Notes (Signed)
Family Medicine Teaching Service Daily Progress Note Intern Pager: [phone]  Patient name: Meredith Bender Medical record number: [id number] Date of birth: 04/19/1926 Age: 83 y.o. Gender: female  Primary Care Provider: [name], MD Consultants: None Code Status: PARTIAL  Pt Overview and Major Events to Date:   9/18 - CXR 9/19 - V/Q scan 9/19 - Lumbar X-ray  Assessment and Plan: Meredith Bender is a 83 y.o. female presenting with SOB. PMH is significant for COPD, HTN, CKD stage IV, vulvar carcinoma on Hospice, OA, HFpEF, HLD, GERD, h/o CVA  Dyspnea: Etiology includes CHF exacerbation vs PE vs PNA. Patient reported that SOB began earlier today. Feels as if she couldn't take a deep breath. Occurs even at rest, improved with sitting up on edge of bed. On 2 L [place] breathing comfortably with pulse ox of 90%. Lungs were clear. CXR shows Cardiac enlargement with interstitial infiltrates that could be c/w edema vs pneumonitis. Suspect interstitial edema. BNP >4,500. Wells score for PE 4. Elevated troponin of 0.10- suggestive of strain from CHF. Also with elevated trop at last admission. No EKG changes and no chest pain. Given IV 40 mg of lasix in ED. SOB possibly due to CHF exacerbation vs PE vs URI vs COPD exacerbation  - Follow up once V/Q scan complete to r/o PE  - Follow troponin; stable - Wean Oxygen and follow sats sitting and ambutlating - Patient can go home with oxygen due to being in hospice  HfpEF: Patient on home Lasix of 40 mg 3 times a week as needed for edema. BNP >4,500. CXR shows interstitial infiltrates that could be c/w edema vs pneumonitis. Suspect interstitial edema. - Strict I/O's: Input 120, Out 0 recorded - Monitor weight 41.5kg - Given 40 mg IV of lasix in ED; consider another dose if appearing volume overloaded  COPD: Not on any home oxygen, has home oxygen in case it is needed - On 2L [place] here satting 92%. - Wean Oxygen, Continue to monitor sats both sitting and  ambulating  HTN: On Hydralazine 25 mg TID at home. Hypertensive since admission with last BP at 183/104, has not had her dose of hydralazine tonight - Continue home medications and monitor - BP improved to 138/61 this am  CKD Stage IV: UA shows possible UTI. However, patient asymptomatic. Received one dose CTX in ED.  - Consider repeat UA if patient becomes symptomatic. No symptoms so no further treatment indicated at this time. - Monitor Cr this am 2.88; baseline is 2.50  Primary Vulvar Carcinoma: Patient received last dose of radiation treatment on 01/10/2017. Patient on Hospice.  Pruritic Rash: Patient reports that she has had a rash that is shingles since June. She reports that she was treated but it has not gone away. She is constantly scratching her arms during the exam. She has not tried anything other than oral steroids in June for the rash. Does not appear to be shingles, appears more eczematous.  - Hydrocortisone cream 0.5% for rash on arms for 1-2 weeks - Hydroxyzine 10 mg for itching  Low Back Pain: Patient had no back pain after taking Tramadol last night but did report some to her PCP before she came into the ED. She did have a fall about 4 days ago and fell on her tailbone but no pain or injury noted at that time. She has no spine tenderness on exam. Due to metastatic disease is at higher risk for vertebral fracture and may benefit from intranasal calcitonin. -  Obtain lumbar X-ray plain films - Give intranasal Calcitonin if vertebral fracture present  Asymptomatic Bacteruria: UA positive for leukocytes but questionable that UA was contaminated due to presence of squamous cells. - No symptoms, no further treatment indicated - Did receive CTX in ED  OA: Stable.  - Patient takes tramadol for chronic pain from OA. -   H/o HLD: not on any home medications  H/o CVA: Control blood pressure  GERD: Stable. - continue home omeprazole  FEN/GI: Regular diet Prophylaxis:  Heparin  Disposition: admit for observation  Subjective:  Meredith Bender is an 83y/o female with PMH of metastatic disease due to vulvovaginal carcinoma, CHF, COPD, CVA, CKD st IV, OA, and GERD. This morning she states she feels good and no longer endorses back pain. She states she does not have any abdominal pain as well. She did have a fall last Thursday where she fell on her tailbone but no reported injury or pain at that time.  This morning she is sitting up in bed breathing comfortably and denies any difficulty breathing or shortness of breath. She denies chest pain, dizziness, urinary burning or pain.  Objective: Temp:  [97.9 F (36.6 C)-98.9 F (37.2 C)] 98.9 F (37.2 C) (09/19 0517) Pulse Rate:  [74-85] 77 (09/19 0649) Resp:  [16-37] 20 (09/19 0517) BP: (165-199)/(76-104) 181/76 (09/19 0649) SpO2:  [87 %-100 %] 92 % (09/19 0649) Weight:  [91 lb 7.9 oz (41.5 kg)] 91 lb 7.9 oz (41.5 kg) (09/19 0517) Physical Exam:  Gen: Alert and Oriented x 3, NAD HEENT: Normocephalic, atraumatic, PERRLA, EOMI Resp: CTAB, no wheezing, rales, or rhonchi, comfortable work of breathing CV: RRR, no murmurs, normal S1, S2 split, +2 pulses dorsalis pedis bilaterally Abd: non-distended, non-tender, soft, +bs in all four quadrants Ext: no clubbing, cyanosis, or edema Skin: warm, dry, intact, dry, flaky, slightly erythematous rash on right and left arm arm and shoulders, ongoing for several months.   Laboratory:  Recent Labs Lab 04/04/17 2103 04/05/17 0600  WBC 7.3 8.6  HGB 11.4* 12.1  HCT 35.7* 38.4  PLT 232 290    Recent Labs Lab 04/04/17 2103  NA 137  K 4.4  CL 107  CO2 24  BUN 38*  CREATININE 2.73*  CALCIUM 8.8*  PROT 6.0*  BILITOT 0.8  ALKPHOS 77  ALT 11*  AST 18  GLUCOSE 105*    9/18 - UA: >300 protein, neg nitrites, large leukocytes, many bacteria, 0-5 squamous 9/18 - UCx: pending 9/18 - Troponin: 0.10,  0.10,  D-dimer - 0.88  Imaging/Diagnostic Tests:  9/18 -  CXR: IMPRESSION: Cardiac enlargement. Mild diffuse interstitial pattern may indicate edema or interstitial pneumonitis. Small bilateral pleural effusions with basilar atelectasis. Aortic atherosclerosis.  9/19 - V/Q scan: pending  Nuala Alpha, DO 04/05/2017, 9:48 AM PGY-1, [place] Intern pager: [phone], text pages welcome

## 2017-04-06 DIAGNOSIS — G8929 Other chronic pain: Secondary | ICD-10-CM

## 2017-04-06 DIAGNOSIS — M549 Dorsalgia, unspecified: Secondary | ICD-10-CM

## 2017-04-06 DIAGNOSIS — M545 Low back pain: Secondary | ICD-10-CM

## 2017-04-06 LAB — BASIC METABOLIC PANEL
ANION GAP: 9 (ref 5–15)
BUN: 43 mg/dL — ABNORMAL HIGH (ref 6–20)
CALCIUM: 8.1 mg/dL — AB (ref 8.9–10.3)
CO2: 22 mmol/L (ref 22–32)
Chloride: 108 mmol/L (ref 101–111)
Creatinine, Ser: 3.14 mg/dL — ABNORMAL HIGH (ref 0.44–1.00)
GFR calc Af Amer: 14 mL/min — ABNORMAL LOW (ref >60)
GFR, EST NON AFRICAN AMERICAN: 12 mL/min — AB (ref >60)
GLUCOSE: 103 mg/dL — AB (ref 65–99)
Potassium: 4.4 mmol/L (ref 3.5–5.1)
Sodium: 139 mmol/L (ref 135–145)

## 2017-04-06 MED ORDER — LORATADINE 10 MG PO TABS: 10.0000 mg | ORAL_TABLET | Freq: Every day | ORAL | 3 refills | Status: AC

## 2017-04-06 NOTE — Discharge Summary (Signed)
[facility] Discharge Summary  Patient name: Meredith Bender Medical record number: [id number] Date of birth: [birth year] Age: 83 y.o. Gender: female Date of Admission: 04/04/2017  Date of Discharge: 04/06/2017 Admitting Physician: [name], MD  Primary Care Provider: [name], MD Consultants: none  Indication for Hospitalization:   Dyspnea  Discharge Diagnoses/Problem List:   CHF exacerbation  Disposition: home to hospice care  Discharge Condition: stable  Discharge Exam:   Gen: Alert and Oriented x 3, NAD HEENT: Normocephalic, atraumatic, PERRLA, EOMI, non-erythematous turbinates, normal pharyngeal mucosa Resp: CTAB, no wheezing, rales, comfortable work of breathing CV: RRR, no murmurs, normal S1, S2 split, +2 pulses dorsalis pedis bilaterally Abd: non-distended, non-tender, soft, +bs in all four quadrants, no hepatosplenomegaly MSK: FROM in all four extremities Ext: no clubbing, cyanosis, trace edema  Brief Hospital Course:  Meredith Bender is an 83y/o female with a PMH of 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 She is on hospice care living at home. Originally, she had called Dr. Nori Bender her PCP and complained of abdominal pain and low back pain on the right. Dr. Nori Bender was concerned and told her to come to the ED to be evaluated. When arriving to the ED she no longer endorsed abdominal pain and stated she took Tramadol for her back pain and now felt better, however, she did feel short of breath. She was having difficulty breathing but it improved with sitting up and receiving 2L of O2 via nasal cannula. She is not using oxygen regularly at home. Her chest x-ray showed bilateral pleural effusion with interstitial edema. She was given a dose of IV Lasix in the ED as well. A D-dimer was drawn due to a Wells score of 4 and to rule out PE. Her D-dimer was found to be  elevated and a V/Q scan was performed that was negative for PE. Her U/A in the ED had numerous leukocytes but was possibly contaminated b/c it had squamous cells. She never endorsed any urinary symptoms so this was assumed to be asymptomatic bacteruria and no treatment was indicated. The ED did give her one dose of CTX.   Per Dr. Nori Bender she was admitted and kept overnight for observation. A lumbar x-ray was performed due to a report of a recent fall and for the new onset back pain and showed no vertebral fracture.  On 9/20 she was down to 2L of O2 and was saturation well. She denied any more SOB or difficulty breathing and had no complaints. Since she is hospice care and has oxygen already at home it was decided to be in the patients best interest to be discharged to her home care.  Of note, her Creatinine was elevated above baseline and continued to rise while in the hospital.   Issues for Follow Up:  1. Please follow up on her Cr to make sure it is returning to her baseline of around 2.5-2.8. 2. Patient was not on any home oxygen before this hospitalization and if still using it by the appointment time it may be worth getting another CXR to ensure her pleural effusion/interstitial edema has not worsened.  Significant Procedures:   9/18 - CXR: IMPRESSION: Cardiac enlargement. Mild diffuse interstitial pattern may indicate edema or interstitial pneumonitis. Small bilateral pleural effusions with basilar atelectasis. Aortic atherosclerosis.  9/19 - V/Q scan: IMPRESSION: 1. No evidence acute pulmonary embolism. 2. Decrease counts  in the RIGHT lower lung favored represent a moderate pleural effusion on chest radiograph. 3. Poor ventilation.  9/19 - Lumbar Spine: IMPRESSION: 1. Osteopenia. 2. Degenerative disc disease.  Significant Labs and Imaging:   Recent Labs Lab 04/04/17 2103 04/05/17 0600  WBC 7.3 8.6  HGB 11.4* 12.1  HCT 35.7* 38.4  PLT 232 290    Recent Labs Lab  04/04/17 2103 04/05/17 0600 04/06/17 0344  NA 137 138 139  K 4.4 3.9 4.4  CL 107 106 108  CO2 24 21* 22  GLUCOSE 105* 96 103*  BUN 38* 35* 43*  CREATININE 2.73* 2.88* 3.14*  CALCIUM 8.8* 9.0 8.1*  ALKPHOS 77  --   --   AST 18  --   --   ALT 11*  --   --   ALBUMIN 3.3*  --   --    9/18 - BNP: >4,500 9/18 - UA: >300 protein, neg nitrites, large leukocytes, many bacteria, 0-5 squamous 9/18 - Troponin: 0.10,  0.10,  D-dimer - 0.88  Results/Tests Pending at Time of Discharge:   9/18 - UCx: pending  Discharge Medications:  Allergies as of 04/06/2017      Reactions   Codeine Nausea Only   Penicillins Rash   Pt does not remember how long ago or severity of reaction.  Has patient had a PCN reaction causing immediate rash, facial/tongue/throat swelling, SOB or lightheadedness with hypotension: Unk Has patient had a PCN reaction causing severe rash involving mucus membranes or skin necrosis: Unk Has patient had a PCN reaction that required hospitalization: Unk Has patient had a PCN reaction occurring within the last 10 years: No If all of the above answers are "NO", then may proceed with Cephalosporin use      Medication List    TAKE these medications   furosemide 40 MG tablet Commonly known as:  LASIX Take one tab by mouth three times a week MWF as needed for fluid overload   hydrALAZINE 25 MG tablet Commonly known as:  APRESOLINE Take 1 tablet (25 mg total) by mouth 3 (three) times daily.   loratadine 10 MG tablet Commonly known as:  CLARITIN Take 1 tablet (10 mg total) by mouth daily.   omeprazole 20 MG capsule Commonly known as:  PRILOSEC Take 1 capsule (20 mg total) by mouth daily.   promethazine 25 MG suppository Commonly known as:  PHENERGAN Place 1 suppository (25 mg total) rectally every 8 (eight) hours as needed for nausea or vomiting.   traMADol 50 MG tablet Commonly known as:  ULTRAM Take one or two tablets by mouth every eight hours for pain What  changed:  how much to take  how to take this  when to take this  reasons to take this  additional instructions   triamcinolone cream 0.1 % Commonly known as:  KENALOG APPLY TO ITCHY AREAS TWO TO THREE TIMES DAILY AS NEEDED   verapamil 240 MG 24 hr capsule Commonly known as:  VERELAN PM TAKE 1 CAPSULE BY MOUTH DAILY   Vitamin D3 3000 units Tabs Take 3,000 Units by mouth daily.   vitamin E 400 UNIT capsule Take by mouth.            Discharge Care Instructions        Start     Ordered   04/06/17 0000  loratadine (CLARITIN) 10 MG tablet  Daily     04/06/17 1251   04/06/17 0000  Increase activity slowly     04/06/17 1352  04/06/17 0000  Diet - low sodium heart healthy     04/06/17 1352      Discharge Instructions: Please refer to Patient Instructions section of EMR for full details.  Patient was counseled important signs and symptoms that should prompt return to medical care, changes in medications, dietary instructions, activity restrictions, and follow up appointments.   Follow-Up Appointments: Follow-up Information    Carlyle Dolly, MD Follow up on 04/13/2017.   Specialty:  Family Medicine Why:  at 1:50PM for hospital follow up  Contact information: [place] [id number] [phone]           Nuala Alpha, DO 04/06/2017, 1:55 PM PGY-1, [place]

## 2017-04-06 NOTE — Care Management Note (Signed)
Case Management Note  Patient Details  Name: Meredith Bender MRN: [id number] Date of Birth: 04/15/1926  Subjective/Objective:                    Action/Plan: Pt discharging home today with [facility]. Pt was already active with their services. CM spoke to [facility] with [facility] and there are no new needs for discharge.  CM met with the patient and her daughter and the daughter is going to pick up an oxygen tank from home for transport home.  Daughter states either she or caregivers are with the patient 24/7.  Daughter providing transportation home.   Expected Discharge Date:                  Expected Discharge Plan:  Home w Hospice Care  In-House Referral:     Discharge planning Services  CM Consult  Post Acute Care Choice:    Choice offered to:     DME Arranged:    DME Agency:     HH Arranged:    [place] Agency:     Status of Service:  Completed, signed off  If discussed at [employer] of Stay Meetings, dates discussed:    Additional Comments:  Pollie Friar, RN 04/06/2017, 11:58 AM

## 2017-04-06 NOTE — Progress Notes (Signed)
Pt discharging home with daughter taking all personal belongings. IV discontinued, dry dressing applied. Discharge instructions along with education provided to pt and family with verbal understanding. Pt has a follow up appt scheduled with PCP. No noted distress.

## 2017-04-06 NOTE — Progress Notes (Signed)
0830 am--[facility]--[facility] GIP RN note--54M-12  This is a related and covered GIP admission on 04/05/2017 with a  [facility] diagnosis of Vulvar cancer, per Dr. Lyman Speller.  Patient has [place] DNR. Hospice nurse activated EMS on the advice of Dr. Nori Riis for patient symptoms increased dyspnea and O2 sats of 82-83% on oxygen. Patient was brought to ED and then admitted.  Spoke with patient at bedside this morning, she was sleeping but easily aroused with calling her name. She appears without distress and she has no complaints at this time. O2 at 2 lpm per [place]. She states that Dr. Nori Riis and another Dr have been in this morning and she will be going home today. She states that her daughter will be picking her up and transported her back to her home. Ms. Sramek denies any immediate needs at this time from [facility].  Will continue to follow. Please feel free to call with any questions related to hospice.  Thank you, Margaretmary Eddy, RN, [facility] Liaison [phone]  [facility] are on [facility]

## 2017-04-06 NOTE — Discharge Instructions (Signed)
Meredith Bender, you were admitted to the hospital due to difficultly breathing. We gave you supplemental oxygen and monitored you for improvement. You progressed well while in the hospital and your vital signs were stable. You had back pain which resolved with pain medication and we did an x-ray of your spine and found no evidence of vertebral fractures. Please return to the ED if you feel like you are not getting better or if you are getting worse. Thank you for letting us participate in your care!

## 2017-04-06 NOTE — Progress Notes (Signed)
Family Medicine Teaching Service Daily Progress Note Intern Pager: [phone]  Patient name: Meredith Bender Medical record number: [id number] Date of birth: 23-Nov-1925 Age: 83 y.o. Gender: female  Primary Care Provider: [name], MD Consultants: None Code Status: PARTIAL  Pt Overview and Major Events to Date:   9/18 - CXR 9/19 - V/Q scan 9/19 - Lumbar X-ray  Assessment and Plan: Meredith Bender is a 83 y.o. female presenting with SOB. PMH is significant for COPD, HTN, CKD stage IV, vulvar carcinoma on Hospice, OA, HFpEF, HLD, GERD, h/o CVA  Dyspnea: Etiology includes CHF exacerbation vs PE vs PNA. On 2 L [place] breathing comfortably with pulse ox of 90%. Lungs were clear. CXR shows Cardiac enlargement with interstitial infiltrates that could be c/w edema vs pneumonitis. Suspect interstitial edema. V/Q showed no PE but moderate right sided pleural effusion.  BNP >4,500. Wells score for PE 4. Elevated troponin of 0.10- suggestive of strain from CHF. Also with elevated trop at last admission. No EKG changes and no chest pain. SOB possibly due to CHF exacerbation vs COPD exacerbation  - Wean Oxygen and follow sats sitting and ambutlating - Patient can go home with oxygen due to being in hospice  HfpEF: Patient on home Lasix of 40 mg 3 times a week as needed for edema. BNP >4,500. CXR shows interstitial infiltrates that could be c/w edema vs pneumonitis. Suspect interstitial edema. - Strict I/O's: Input 720, Out 800 - Monitor weight 41.5kg -> 43.7kg (need to verify correct readings with nursing) - Consider another dose of Lasix today if appearing volume overloaded  COPD: Not on any home oxygen, has home oxygen in case it is needed - On 2L  here satting 92%. - Wean Oxygen, Continue to monitor sats both sitting and ambulating  HTN: On Hydralazine 25 mg TID at home. Hypertensive since admission with last BP at 183/104, has not had her dose of hydralazine tonight - Continue home medications  and monitor - BP is elevated this am; 112-154/47-64 in last 24hrs  AKI: worsening. Patient has CKD but Cr has continued to rise over baseline since admission. - Conservative approach with Lasix as needed for diuresis due to possible CHF exacerbation.  CKD Stage IV: UA shows possible UTI. However, patient asymptomatic. Received one dose CTX in ED.  - Consider repeat UA if patient becomes symptomatic. No symptoms so no further treatment indicated at this time. - Monitor Cr this am 3.14; baseline is 2.50  Primary Vulvar Carcinoma: Patient received last dose of radiation treatment on 01/10/2017. Patient on Hospice.  Pruritic Rash: Patient reports that rash has improved with cream and medication she has gotten while in the hospital. Does not appear to be shingles, appears more eczematous.  - Hydrocortisone cream 0.5% for rash on arms for 1-2 weeks - Hydroxyzine 10 mg for itching  Low Back Pain: Patient had no back pain after taking Tramadol last night but did report some to her PCP before she came into the ED. She did have a fall about 4 days ago and fell on her tailbone but no pain or injury noted at that time. She has no spine tenderness on exam. Due to metastatic disease is at higher risk for vertebral fracture and may benefit from intranasal calcitonin. - Lumbar X-ray plain films showed no vertebral fracture, osteopenia and DDD.  Asymptomatic Bacteruria: UA positive for leukocytes but questionable that UA was contaminated due to presence of squamous cells. - No symptoms, no further treatment indicated -  Did receive CTX in ED  OA: Stable.  - Patient takes tramadol for chronic pain from OA.  H/o HLD: not on any home medications  GERD: Stable. - continue home omeprazole  FEN/GI: Regular diet Prophylaxis: Heparin  Disposition: d/c home today  Subjective:  Meredith Bender is an 83y/o female with PMH of metastatic disease due to vulvovaginal carcinoma, CHF, COPD, CVA, CKD st IV,  OA, and GERD.   This morning she is feeling well and has no complaints. She states she feels her breathing has improved but has started a dry, non-productive cough which she states began yesterday. She is sitting up in bed breathing comfortably and denies any difficulty breathing or shortness of breath. She denies chest pain, dizziness, abdominal pain, back pain, urinary burning or pain, and no increased urinary frequency or urge to void.  Objective: Temp:  [98.1 F (36.7 C)-99.1 F (37.3 C)] 99.1 F (37.3 C) (09/20 0758) Pulse Rate:  [77-82] 80 (09/20 0758) Resp:  [16-18] 18 (09/20 0758) BP: (112-164)/(47-64) 164/63 (09/20 0758) SpO2:  [93 %-97 %] 93 % (09/20 0758) Weight:  [96 lb 6.4 oz (43.7 kg)] 96 lb 6.4 oz (43.7 kg) (09/20 0500) Physical Exam:  Gen: Alert and Oriented x 3, NAD HEENT: Normocephalic, atraumatic, PERRLA, EOMI Resp: CTAB, no wheezing, rales, comfortable work of breathing CV: RRR, no murmurs, normal S1, S2 split, +2 pulses dorsalis pedis bilaterally Abd: non-distended, non-tender, soft, +bs in all four quadrants Ext: no clubbing, cyanosis, trace edema Skin: warm, dry, intact, dry, flaky, slightly erythematous rash on right and left arm arm and shoulders, ongoing for several months.   Laboratory:  Recent Labs Lab 04/04/17 2103 04/05/17 0600  WBC 7.3 8.6  HGB 11.4* 12.1  HCT 35.7* 38.4  PLT 232 290    Recent Labs Lab 04/04/17 2103 04/05/17 0600 04/06/17 0344  NA 137 138 139  K 4.4 3.9 4.4  CL 107 106 108  CO2 24 21* 22  BUN 38* 35* 43*  CREATININE 2.73* 2.88* 3.14*  CALCIUM 8.8* 9.0 8.1*  PROT 6.0*  --   --   BILITOT 0.8  --   --   ALKPHOS 77  --   --   ALT 11*  --   --   AST 18  --   --   GLUCOSE 105* 96 103*    9/18 - UA: >300 protein, neg nitrites, large leukocytes, many bacteria, 0-5 squamous 9/18 - UCx: pending 9/18 - Troponin: 0.10,  0.10,  D-dimer - 0.88  Imaging/Diagnostic Tests:  9/18 - CXR: IMPRESSION: Cardiac enlargement.  Mild diffuse interstitial pattern may indicate edema or interstitial pneumonitis. Small bilateral pleural effusions with basilar atelectasis. Aortic atherosclerosis.  9/19 - V/Q scan: IMPRESSION: 1. No evidence acute pulmonary embolism. 2. Decrease counts in the RIGHT lower lung favored represent a moderate pleural effusion on chest radiograph. 3. Poor ventilation.  9/19 - Lumbar Spine: IMPRESSION: 1. Osteopenia. 2. Degenerative disc disease.  Nuala Alpha, DO 04/06/2017, 9:23 AM PGY-1, [place] Intern pager: [phone], text pages welcome

## 2017-04-07 ENCOUNTER — Telehealth: Payer: Self-pay | Admitting: Family Medicine

## 2017-04-07 LAB — URINE CULTURE

## 2017-04-07 MED ORDER — CEPHALEXIN 500 MG PO CAPS: 500.0000 mg | ORAL_CAPSULE | Freq: Four times a day (QID) | ORAL | 0 refills | Status: AC

## 2017-04-07 NOTE — Telephone Encounter (Signed)
On review of recent admission, E coli UTI noted on urine culture. Patient was initially thought to have asymptomatic bacteruria based on urinanalysis on admission. E coli appears to be sensitive to Keflex. Patient was given one dose of ceftriaxone and tolerated this well despite allergy listed as rash to penicillins. Will send Keflex x 7d to pharmacy. Called both daughter's cell phone and home phone, left VM on home phone to return call to clinic number to explain this. Also spoke with Dr. Nori Riis as Juluis Rainier.   Ralene Ok, MD

## 2017-04-07 NOTE — Consult Note (Signed)
           [facility] CM Primary Care Navigator  04/07/2017  Meredith Bender 1926-06-25 [id number]   Attempt to see patient to identify possible discharge needs but she was already discharged per staff report. Patient discharged home yesterday [facility] Patient was already active with their services per Inpatient CM note.  No further health management neededat this point.  For questions, please contact:  Dannielle Huh, BSN, RN- [facility] Primary Care Navigator  Telephone: [phone] [place]

## 2017-04-10 ENCOUNTER — Telehealth: Payer: Self-pay | Admitting: Family Medicine

## 2017-04-10 NOTE — Telephone Encounter (Signed)
Dear [name] Team Can you make   sure she received message about [place] calling in antibiotic for her UTI? See Dr Rosario Jacks previous phone note THANKS! Meredith Bender

## 2017-04-11 NOTE — Telephone Encounter (Deleted)
Spoke to caregiver

## 2017-04-11 NOTE — Telephone Encounter (Signed)
Spoke to caregiver. Will give daughter a message to call our office. Ottis Stain, CMA

## 2017-04-13 ENCOUNTER — Inpatient Hospital Stay: Admitting: Family Medicine

## 2017-04-14 NOTE — Telephone Encounter (Signed)
Spoke with caregiver who stated she picked up Rx last Friday. Katharina Caper, Alfhild Partch D, [place]

## 2017-04-17 ENCOUNTER — Other Ambulatory Visit: Payer: Self-pay | Admitting: Family Medicine

## 2017-04-17 DIAGNOSIS — E46 Unspecified protein-calorie malnutrition: Secondary | ICD-10-CM | POA: Diagnosis not present

## 2017-04-17 DIAGNOSIS — E785 Hyperlipidemia, unspecified: Secondary | ICD-10-CM | POA: Diagnosis not present

## 2017-04-17 DIAGNOSIS — C519 Malignant neoplasm of vulva, unspecified: Secondary | ICD-10-CM | POA: Diagnosis not present

## 2017-04-17 DIAGNOSIS — I10 Essential (primary) hypertension: Secondary | ICD-10-CM | POA: Diagnosis not present

## 2017-04-17 DIAGNOSIS — N185 Chronic kidney disease, stage 5: Secondary | ICD-10-CM | POA: Diagnosis not present

## 2017-04-17 DIAGNOSIS — J449 Chronic obstructive pulmonary disease, unspecified: Secondary | ICD-10-CM | POA: Diagnosis not present

## 2017-04-17 DIAGNOSIS — K219 Gastro-esophageal reflux disease without esophagitis: Secondary | ICD-10-CM | POA: Diagnosis not present

## 2017-04-17 DIAGNOSIS — C774 Secondary and unspecified malignant neoplasm of inguinal and lower limb lymph nodes: Secondary | ICD-10-CM | POA: Diagnosis not present

## 2017-04-17 DIAGNOSIS — I509 Heart failure, unspecified: Secondary | ICD-10-CM | POA: Diagnosis not present

## 2017-04-18 DIAGNOSIS — N185 Chronic kidney disease, stage 5: Secondary | ICD-10-CM | POA: Diagnosis not present

## 2017-04-18 DIAGNOSIS — C519 Malignant neoplasm of vulva, unspecified: Secondary | ICD-10-CM | POA: Diagnosis not present

## 2017-04-18 DIAGNOSIS — I509 Heart failure, unspecified: Secondary | ICD-10-CM | POA: Diagnosis not present

## 2017-04-18 DIAGNOSIS — I10 Essential (primary) hypertension: Secondary | ICD-10-CM | POA: Diagnosis not present

## 2017-04-18 DIAGNOSIS — C774 Secondary and unspecified malignant neoplasm of inguinal and lower limb lymph nodes: Secondary | ICD-10-CM | POA: Diagnosis not present

## 2017-04-18 DIAGNOSIS — E46 Unspecified protein-calorie malnutrition: Secondary | ICD-10-CM | POA: Diagnosis not present

## 2017-04-24 DIAGNOSIS — N185 Chronic kidney disease, stage 5: Secondary | ICD-10-CM | POA: Diagnosis not present

## 2017-04-24 DIAGNOSIS — C774 Secondary and unspecified malignant neoplasm of inguinal and lower limb lymph nodes: Secondary | ICD-10-CM | POA: Diagnosis not present

## 2017-04-24 DIAGNOSIS — I509 Heart failure, unspecified: Secondary | ICD-10-CM | POA: Diagnosis not present

## 2017-04-24 DIAGNOSIS — E46 Unspecified protein-calorie malnutrition: Secondary | ICD-10-CM | POA: Diagnosis not present

## 2017-04-24 DIAGNOSIS — C519 Malignant neoplasm of vulva, unspecified: Secondary | ICD-10-CM | POA: Diagnosis not present

## 2017-04-24 DIAGNOSIS — I10 Essential (primary) hypertension: Secondary | ICD-10-CM | POA: Diagnosis not present

## 2017-04-26 DIAGNOSIS — C519 Malignant neoplasm of vulva, unspecified: Secondary | ICD-10-CM | POA: Diagnosis not present

## 2017-04-26 DIAGNOSIS — I10 Essential (primary) hypertension: Secondary | ICD-10-CM | POA: Diagnosis not present

## 2017-04-26 DIAGNOSIS — N185 Chronic kidney disease, stage 5: Secondary | ICD-10-CM | POA: Diagnosis not present

## 2017-04-26 DIAGNOSIS — C774 Secondary and unspecified malignant neoplasm of inguinal and lower limb lymph nodes: Secondary | ICD-10-CM | POA: Diagnosis not present

## 2017-04-26 DIAGNOSIS — I509 Heart failure, unspecified: Secondary | ICD-10-CM | POA: Diagnosis not present

## 2017-04-26 DIAGNOSIS — E46 Unspecified protein-calorie malnutrition: Secondary | ICD-10-CM | POA: Diagnosis not present

## 2017-05-01 DIAGNOSIS — C519 Malignant neoplasm of vulva, unspecified: Secondary | ICD-10-CM | POA: Diagnosis not present

## 2017-05-01 DIAGNOSIS — E46 Unspecified protein-calorie malnutrition: Secondary | ICD-10-CM | POA: Diagnosis not present

## 2017-05-01 DIAGNOSIS — N185 Chronic kidney disease, stage 5: Secondary | ICD-10-CM | POA: Diagnosis not present

## 2017-05-01 DIAGNOSIS — I509 Heart failure, unspecified: Secondary | ICD-10-CM | POA: Diagnosis not present

## 2017-05-01 DIAGNOSIS — C774 Secondary and unspecified malignant neoplasm of inguinal and lower limb lymph nodes: Secondary | ICD-10-CM | POA: Diagnosis not present

## 2017-05-01 DIAGNOSIS — I10 Essential (primary) hypertension: Secondary | ICD-10-CM | POA: Diagnosis not present

## 2017-05-03 ENCOUNTER — Ambulatory Visit: Admitting: Family Medicine

## 2017-05-03 DIAGNOSIS — E46 Unspecified protein-calorie malnutrition: Secondary | ICD-10-CM | POA: Diagnosis not present

## 2017-05-03 DIAGNOSIS — N185 Chronic kidney disease, stage 5: Secondary | ICD-10-CM | POA: Diagnosis not present

## 2017-05-03 DIAGNOSIS — I10 Essential (primary) hypertension: Secondary | ICD-10-CM | POA: Diagnosis not present

## 2017-05-03 DIAGNOSIS — C519 Malignant neoplasm of vulva, unspecified: Secondary | ICD-10-CM | POA: Diagnosis not present

## 2017-05-03 DIAGNOSIS — I509 Heart failure, unspecified: Secondary | ICD-10-CM | POA: Diagnosis not present

## 2017-05-03 DIAGNOSIS — C774 Secondary and unspecified malignant neoplasm of inguinal and lower limb lymph nodes: Secondary | ICD-10-CM | POA: Diagnosis not present

## 2017-05-04 ENCOUNTER — Ambulatory Visit (INDEPENDENT_AMBULATORY_CARE_PROVIDER_SITE_OTHER): Admitting: Family Medicine

## 2017-05-04 ENCOUNTER — Encounter: Payer: Self-pay | Admitting: Family Medicine

## 2017-05-04 DIAGNOSIS — N185 Chronic kidney disease, stage 5: Secondary | ICD-10-CM | POA: Diagnosis not present

## 2017-05-04 DIAGNOSIS — C774 Secondary and unspecified malignant neoplasm of inguinal and lower limb lymph nodes: Secondary | ICD-10-CM | POA: Diagnosis not present

## 2017-05-04 DIAGNOSIS — C519 Malignant neoplasm of vulva, unspecified: Secondary | ICD-10-CM | POA: Diagnosis not present

## 2017-05-04 DIAGNOSIS — I509 Heart failure, unspecified: Secondary | ICD-10-CM | POA: Diagnosis not present

## 2017-05-04 DIAGNOSIS — E46 Unspecified protein-calorie malnutrition: Secondary | ICD-10-CM | POA: Diagnosis not present

## 2017-05-04 DIAGNOSIS — I10 Essential (primary) hypertension: Secondary | ICD-10-CM | POA: Diagnosis not present

## 2017-05-04 DIAGNOSIS — C799 Secondary malignant neoplasm of unspecified site: Secondary | ICD-10-CM | POA: Diagnosis not present

## 2017-05-04 MED ORDER — PREDNISONE 10 MG PO TABS: ORAL_TABLET | ORAL | 1 refills | Status: AC

## 2017-05-04 MED ORDER — FLUOCINONIDE 0.05 % EX CREA: TOPICAL_CREAM | CUTANEOUS | 2 refills | Status: AC

## 2017-05-04 NOTE — Patient Instructions (Signed)
I am giving you a stronger cream for your rash. I am also starting you on a prednisone tablet daily. Please take it with food. If you have had NO or LITTLE improvement in your rash in 5-7 days, please call the office and I will likely increase the dose  Of prednisone. Otherwise you look GREAT. It is always great to see you.  PLEASE CALL THE OFFICE (or have your daughter contact me directly) if you need anything.

## 2017-05-05 ENCOUNTER — Telehealth: Payer: Self-pay | Admitting: *Deleted

## 2017-05-05 DIAGNOSIS — E46 Unspecified protein-calorie malnutrition: Secondary | ICD-10-CM | POA: Diagnosis not present

## 2017-05-05 DIAGNOSIS — N185 Chronic kidney disease, stage 5: Secondary | ICD-10-CM | POA: Diagnosis not present

## 2017-05-05 DIAGNOSIS — I509 Heart failure, unspecified: Secondary | ICD-10-CM | POA: Diagnosis not present

## 2017-05-05 DIAGNOSIS — C774 Secondary and unspecified malignant neoplasm of inguinal and lower limb lymph nodes: Secondary | ICD-10-CM | POA: Diagnosis not present

## 2017-05-05 DIAGNOSIS — C519 Malignant neoplasm of vulva, unspecified: Secondary | ICD-10-CM | POA: Diagnosis not present

## 2017-05-05 DIAGNOSIS — I10 Essential (primary) hypertension: Secondary | ICD-10-CM | POA: Diagnosis not present

## 2017-05-05 NOTE — Telephone Encounter (Signed)
Received message on nurse line from Estelle June (caregiver) requesting return call from PCP to discuss oxygen level. She may be reached at [phone]. Hubbard Hartshorn, RN, BSN

## 2017-05-05 NOTE — Telephone Encounter (Signed)
Called caregiver Oxygen at 2 liters they bumped it up to 2.5 --ok to bump to 3 liters if they need She is only using it intermittently

## 2017-05-06 NOTE — Progress Notes (Signed)
    CHIEF COMPLAINT / HPI:  F/u recent hospital stay for SOB / CHF Doing better Back to baseline except for worsening rash on arms and legs mostly. Itchy Still gets a little SOb at times and uses her prn oxygen at those episodes Is here with her acre taker / aid  REVIEW OF SYSTEMS: No chest pain. No dizziness. SOme fatigue  PERTINENT  PMH / PSH: I have reviewed the patient's medications, allergies, past medical and surgical history, smoking status and updated in the EMR as appropriate. Metastatic vulvar cancer Home Hospice  OBJECTIVE:   GEN WD thin female, somewhat ill appearing. NAD CV RRR LUNGS CTA no unusual respiratory effort SKIN nodular and papular erythematous lesions. Notblanchable. Legs and arms and hands. PSYCH AXOX4. Affect interactive. Asks and answers questions appropriately. Mentating quite well.  ASSESSMENT / PLAN: Please see problem oriented charting for details

## 2017-05-06 NOTE — Assessment & Plan Note (Signed)
I wonder if her skin rash is actually a systemic sequelae of her metastatic vulvar cancer?Will try some  Oral prednisone and increasing potency of topical steroid. My goal is for comfort. She seems to be otherwise doing remakably well.

## 2017-05-10 DIAGNOSIS — C519 Malignant neoplasm of vulva, unspecified: Secondary | ICD-10-CM | POA: Diagnosis not present

## 2017-05-10 DIAGNOSIS — C774 Secondary and unspecified malignant neoplasm of inguinal and lower limb lymph nodes: Secondary | ICD-10-CM | POA: Diagnosis not present

## 2017-05-10 DIAGNOSIS — E46 Unspecified protein-calorie malnutrition: Secondary | ICD-10-CM | POA: Diagnosis not present

## 2017-05-10 DIAGNOSIS — N185 Chronic kidney disease, stage 5: Secondary | ICD-10-CM | POA: Diagnosis not present

## 2017-05-10 DIAGNOSIS — I10 Essential (primary) hypertension: Secondary | ICD-10-CM | POA: Diagnosis not present

## 2017-05-10 DIAGNOSIS — I509 Heart failure, unspecified: Secondary | ICD-10-CM | POA: Diagnosis not present

## 2017-05-18 DIAGNOSIS — K219 Gastro-esophageal reflux disease without esophagitis: Secondary | ICD-10-CM | POA: Diagnosis not present

## 2017-05-18 DIAGNOSIS — C519 Malignant neoplasm of vulva, unspecified: Secondary | ICD-10-CM | POA: Diagnosis not present

## 2017-05-18 DIAGNOSIS — I10 Essential (primary) hypertension: Secondary | ICD-10-CM | POA: Diagnosis not present

## 2017-05-18 DIAGNOSIS — N185 Chronic kidney disease, stage 5: Secondary | ICD-10-CM | POA: Diagnosis not present

## 2017-05-18 DIAGNOSIS — C774 Secondary and unspecified malignant neoplasm of inguinal and lower limb lymph nodes: Secondary | ICD-10-CM | POA: Diagnosis not present

## 2017-05-18 DIAGNOSIS — E46 Unspecified protein-calorie malnutrition: Secondary | ICD-10-CM | POA: Diagnosis not present

## 2017-05-18 DIAGNOSIS — E785 Hyperlipidemia, unspecified: Secondary | ICD-10-CM | POA: Diagnosis not present

## 2017-05-18 DIAGNOSIS — J449 Chronic obstructive pulmonary disease, unspecified: Secondary | ICD-10-CM | POA: Diagnosis not present

## 2017-05-18 DIAGNOSIS — I509 Heart failure, unspecified: Secondary | ICD-10-CM | POA: Diagnosis not present

## 2017-05-19 DIAGNOSIS — N185 Chronic kidney disease, stage 5: Secondary | ICD-10-CM | POA: Diagnosis not present

## 2017-05-19 DIAGNOSIS — C519 Malignant neoplasm of vulva, unspecified: Secondary | ICD-10-CM | POA: Diagnosis not present

## 2017-05-19 DIAGNOSIS — E46 Unspecified protein-calorie malnutrition: Secondary | ICD-10-CM | POA: Diagnosis not present

## 2017-05-19 DIAGNOSIS — I509 Heart failure, unspecified: Secondary | ICD-10-CM | POA: Diagnosis not present

## 2017-05-19 DIAGNOSIS — I10 Essential (primary) hypertension: Secondary | ICD-10-CM | POA: Diagnosis not present

## 2017-05-19 DIAGNOSIS — C774 Secondary and unspecified malignant neoplasm of inguinal and lower limb lymph nodes: Secondary | ICD-10-CM | POA: Diagnosis not present

## 2017-05-22 DIAGNOSIS — C519 Malignant neoplasm of vulva, unspecified: Secondary | ICD-10-CM | POA: Diagnosis not present

## 2017-05-22 DIAGNOSIS — N185 Chronic kidney disease, stage 5: Secondary | ICD-10-CM | POA: Diagnosis not present

## 2017-05-22 DIAGNOSIS — I10 Essential (primary) hypertension: Secondary | ICD-10-CM | POA: Diagnosis not present

## 2017-05-22 DIAGNOSIS — C774 Secondary and unspecified malignant neoplasm of inguinal and lower limb lymph nodes: Secondary | ICD-10-CM | POA: Diagnosis not present

## 2017-05-22 DIAGNOSIS — E46 Unspecified protein-calorie malnutrition: Secondary | ICD-10-CM | POA: Diagnosis not present

## 2017-05-22 DIAGNOSIS — I509 Heart failure, unspecified: Secondary | ICD-10-CM | POA: Diagnosis not present

## 2017-05-24 DIAGNOSIS — N185 Chronic kidney disease, stage 5: Secondary | ICD-10-CM | POA: Diagnosis not present

## 2017-05-24 DIAGNOSIS — C519 Malignant neoplasm of vulva, unspecified: Secondary | ICD-10-CM | POA: Diagnosis not present

## 2017-05-24 DIAGNOSIS — C774 Secondary and unspecified malignant neoplasm of inguinal and lower limb lymph nodes: Secondary | ICD-10-CM | POA: Diagnosis not present

## 2017-05-24 DIAGNOSIS — I509 Heart failure, unspecified: Secondary | ICD-10-CM | POA: Diagnosis not present

## 2017-05-24 DIAGNOSIS — E46 Unspecified protein-calorie malnutrition: Secondary | ICD-10-CM | POA: Diagnosis not present

## 2017-05-24 DIAGNOSIS — I10 Essential (primary) hypertension: Secondary | ICD-10-CM | POA: Diagnosis not present

## 2017-05-31 DIAGNOSIS — E46 Unspecified protein-calorie malnutrition: Secondary | ICD-10-CM | POA: Diagnosis not present

## 2017-05-31 DIAGNOSIS — C774 Secondary and unspecified malignant neoplasm of inguinal and lower limb lymph nodes: Secondary | ICD-10-CM | POA: Diagnosis not present

## 2017-05-31 DIAGNOSIS — C519 Malignant neoplasm of vulva, unspecified: Secondary | ICD-10-CM | POA: Diagnosis not present

## 2017-05-31 DIAGNOSIS — I509 Heart failure, unspecified: Secondary | ICD-10-CM | POA: Diagnosis not present

## 2017-05-31 DIAGNOSIS — I10 Essential (primary) hypertension: Secondary | ICD-10-CM | POA: Diagnosis not present

## 2017-05-31 DIAGNOSIS — N185 Chronic kidney disease, stage 5: Secondary | ICD-10-CM | POA: Diagnosis not present

## 2017-06-01 ENCOUNTER — Other Ambulatory Visit: Payer: Self-pay | Admitting: Family Medicine

## 2017-06-06 DIAGNOSIS — C519 Malignant neoplasm of vulva, unspecified: Secondary | ICD-10-CM | POA: Diagnosis not present

## 2017-06-06 DIAGNOSIS — E46 Unspecified protein-calorie malnutrition: Secondary | ICD-10-CM | POA: Diagnosis not present

## 2017-06-06 DIAGNOSIS — I10 Essential (primary) hypertension: Secondary | ICD-10-CM | POA: Diagnosis not present

## 2017-06-06 DIAGNOSIS — C774 Secondary and unspecified malignant neoplasm of inguinal and lower limb lymph nodes: Secondary | ICD-10-CM | POA: Diagnosis not present

## 2017-06-06 DIAGNOSIS — I509 Heart failure, unspecified: Secondary | ICD-10-CM | POA: Diagnosis not present

## 2017-06-06 DIAGNOSIS — N185 Chronic kidney disease, stage 5: Secondary | ICD-10-CM | POA: Diagnosis not present

## 2017-06-07 DIAGNOSIS — N185 Chronic kidney disease, stage 5: Secondary | ICD-10-CM | POA: Diagnosis not present

## 2017-06-07 DIAGNOSIS — I10 Essential (primary) hypertension: Secondary | ICD-10-CM | POA: Diagnosis not present

## 2017-06-07 DIAGNOSIS — I509 Heart failure, unspecified: Secondary | ICD-10-CM | POA: Diagnosis not present

## 2017-06-07 DIAGNOSIS — C519 Malignant neoplasm of vulva, unspecified: Secondary | ICD-10-CM | POA: Diagnosis not present

## 2017-06-07 DIAGNOSIS — E46 Unspecified protein-calorie malnutrition: Secondary | ICD-10-CM | POA: Diagnosis not present

## 2017-06-07 DIAGNOSIS — C774 Secondary and unspecified malignant neoplasm of inguinal and lower limb lymph nodes: Secondary | ICD-10-CM | POA: Diagnosis not present

## 2017-06-14 DIAGNOSIS — I10 Essential (primary) hypertension: Secondary | ICD-10-CM | POA: Diagnosis not present

## 2017-06-14 DIAGNOSIS — I509 Heart failure, unspecified: Secondary | ICD-10-CM | POA: Diagnosis not present

## 2017-06-14 DIAGNOSIS — E46 Unspecified protein-calorie malnutrition: Secondary | ICD-10-CM | POA: Diagnosis not present

## 2017-06-14 DIAGNOSIS — C519 Malignant neoplasm of vulva, unspecified: Secondary | ICD-10-CM | POA: Diagnosis not present

## 2017-06-14 DIAGNOSIS — C774 Secondary and unspecified malignant neoplasm of inguinal and lower limb lymph nodes: Secondary | ICD-10-CM | POA: Diagnosis not present

## 2017-06-14 DIAGNOSIS — N185 Chronic kidney disease, stage 5: Secondary | ICD-10-CM | POA: Diagnosis not present

## 2017-06-17 DIAGNOSIS — I10 Essential (primary) hypertension: Secondary | ICD-10-CM | POA: Diagnosis not present

## 2017-06-17 DIAGNOSIS — I509 Heart failure, unspecified: Secondary | ICD-10-CM | POA: Diagnosis not present

## 2017-06-17 DIAGNOSIS — E46 Unspecified protein-calorie malnutrition: Secondary | ICD-10-CM | POA: Diagnosis not present

## 2017-06-17 DIAGNOSIS — N185 Chronic kidney disease, stage 5: Secondary | ICD-10-CM | POA: Diagnosis not present

## 2017-06-17 DIAGNOSIS — C774 Secondary and unspecified malignant neoplasm of inguinal and lower limb lymph nodes: Secondary | ICD-10-CM | POA: Diagnosis not present

## 2017-06-17 DIAGNOSIS — J449 Chronic obstructive pulmonary disease, unspecified: Secondary | ICD-10-CM | POA: Diagnosis not present

## 2017-06-17 DIAGNOSIS — E785 Hyperlipidemia, unspecified: Secondary | ICD-10-CM | POA: Diagnosis not present

## 2017-06-17 DIAGNOSIS — C519 Malignant neoplasm of vulva, unspecified: Secondary | ICD-10-CM | POA: Diagnosis not present

## 2017-06-17 DIAGNOSIS — K219 Gastro-esophageal reflux disease without esophagitis: Secondary | ICD-10-CM | POA: Diagnosis not present

## 2017-06-18 DIAGNOSIS — I509 Heart failure, unspecified: Secondary | ICD-10-CM | POA: Diagnosis not present

## 2017-06-18 DIAGNOSIS — I10 Essential (primary) hypertension: Secondary | ICD-10-CM | POA: Diagnosis not present

## 2017-06-18 DIAGNOSIS — N185 Chronic kidney disease, stage 5: Secondary | ICD-10-CM | POA: Diagnosis not present

## 2017-06-18 DIAGNOSIS — C774 Secondary and unspecified malignant neoplasm of inguinal and lower limb lymph nodes: Secondary | ICD-10-CM | POA: Diagnosis not present

## 2017-06-18 DIAGNOSIS — E46 Unspecified protein-calorie malnutrition: Secondary | ICD-10-CM | POA: Diagnosis not present

## 2017-06-18 DIAGNOSIS — C519 Malignant neoplasm of vulva, unspecified: Secondary | ICD-10-CM | POA: Diagnosis not present

## 2017-06-20 DIAGNOSIS — N185 Chronic kidney disease, stage 5: Secondary | ICD-10-CM | POA: Diagnosis not present

## 2017-06-20 DIAGNOSIS — E46 Unspecified protein-calorie malnutrition: Secondary | ICD-10-CM | POA: Diagnosis not present

## 2017-06-20 DIAGNOSIS — C774 Secondary and unspecified malignant neoplasm of inguinal and lower limb lymph nodes: Secondary | ICD-10-CM | POA: Diagnosis not present

## 2017-06-20 DIAGNOSIS — I509 Heart failure, unspecified: Secondary | ICD-10-CM | POA: Diagnosis not present

## 2017-06-20 DIAGNOSIS — C519 Malignant neoplasm of vulva, unspecified: Secondary | ICD-10-CM | POA: Diagnosis not present

## 2017-06-20 DIAGNOSIS — I10 Essential (primary) hypertension: Secondary | ICD-10-CM | POA: Diagnosis not present

## 2017-06-30 DIAGNOSIS — C519 Malignant neoplasm of vulva, unspecified: Secondary | ICD-10-CM | POA: Diagnosis not present

## 2017-06-30 DIAGNOSIS — N185 Chronic kidney disease, stage 5: Secondary | ICD-10-CM | POA: Diagnosis not present

## 2017-06-30 DIAGNOSIS — I10 Essential (primary) hypertension: Secondary | ICD-10-CM | POA: Diagnosis not present

## 2017-06-30 DIAGNOSIS — C774 Secondary and unspecified malignant neoplasm of inguinal and lower limb lymph nodes: Secondary | ICD-10-CM | POA: Diagnosis not present

## 2017-06-30 DIAGNOSIS — E46 Unspecified protein-calorie malnutrition: Secondary | ICD-10-CM | POA: Diagnosis not present

## 2017-06-30 DIAGNOSIS — I509 Heart failure, unspecified: Secondary | ICD-10-CM | POA: Diagnosis not present

## 2017-07-03 DIAGNOSIS — N185 Chronic kidney disease, stage 5: Secondary | ICD-10-CM | POA: Diagnosis not present

## 2017-07-03 DIAGNOSIS — C774 Secondary and unspecified malignant neoplasm of inguinal and lower limb lymph nodes: Secondary | ICD-10-CM | POA: Diagnosis not present

## 2017-07-03 DIAGNOSIS — C519 Malignant neoplasm of vulva, unspecified: Secondary | ICD-10-CM | POA: Diagnosis not present

## 2017-07-03 DIAGNOSIS — I10 Essential (primary) hypertension: Secondary | ICD-10-CM | POA: Diagnosis not present

## 2017-07-03 DIAGNOSIS — E46 Unspecified protein-calorie malnutrition: Secondary | ICD-10-CM | POA: Diagnosis not present

## 2017-07-03 DIAGNOSIS — I509 Heart failure, unspecified: Secondary | ICD-10-CM | POA: Diagnosis not present

## 2017-07-04 DIAGNOSIS — I509 Heart failure, unspecified: Secondary | ICD-10-CM | POA: Diagnosis not present

## 2017-07-04 DIAGNOSIS — E46 Unspecified protein-calorie malnutrition: Secondary | ICD-10-CM | POA: Diagnosis not present

## 2017-07-04 DIAGNOSIS — I10 Essential (primary) hypertension: Secondary | ICD-10-CM | POA: Diagnosis not present

## 2017-07-04 DIAGNOSIS — C774 Secondary and unspecified malignant neoplasm of inguinal and lower limb lymph nodes: Secondary | ICD-10-CM | POA: Diagnosis not present

## 2017-07-04 DIAGNOSIS — C519 Malignant neoplasm of vulva, unspecified: Secondary | ICD-10-CM | POA: Diagnosis not present

## 2017-07-04 DIAGNOSIS — N185 Chronic kidney disease, stage 5: Secondary | ICD-10-CM | POA: Diagnosis not present

## 2017-07-05 DIAGNOSIS — I509 Heart failure, unspecified: Secondary | ICD-10-CM | POA: Diagnosis not present

## 2017-07-05 DIAGNOSIS — E46 Unspecified protein-calorie malnutrition: Secondary | ICD-10-CM | POA: Diagnosis not present

## 2017-07-05 DIAGNOSIS — C774 Secondary and unspecified malignant neoplasm of inguinal and lower limb lymph nodes: Secondary | ICD-10-CM | POA: Diagnosis not present

## 2017-07-05 DIAGNOSIS — C519 Malignant neoplasm of vulva, unspecified: Secondary | ICD-10-CM | POA: Diagnosis not present

## 2017-07-05 DIAGNOSIS — N185 Chronic kidney disease, stage 5: Secondary | ICD-10-CM | POA: Diagnosis not present

## 2017-07-05 DIAGNOSIS — I10 Essential (primary) hypertension: Secondary | ICD-10-CM | POA: Diagnosis not present

## 2017-07-06 ENCOUNTER — Telehealth: Payer: Self-pay | Admitting: *Deleted

## 2017-07-06 DIAGNOSIS — I10 Essential (primary) hypertension: Secondary | ICD-10-CM | POA: Diagnosis not present

## 2017-07-06 DIAGNOSIS — C519 Malignant neoplasm of vulva, unspecified: Secondary | ICD-10-CM | POA: Diagnosis not present

## 2017-07-06 DIAGNOSIS — I509 Heart failure, unspecified: Secondary | ICD-10-CM | POA: Diagnosis not present

## 2017-07-06 DIAGNOSIS — E46 Unspecified protein-calorie malnutrition: Secondary | ICD-10-CM | POA: Diagnosis not present

## 2017-07-06 DIAGNOSIS — C774 Secondary and unspecified malignant neoplasm of inguinal and lower limb lymph nodes: Secondary | ICD-10-CM | POA: Diagnosis not present

## 2017-07-06 DIAGNOSIS — N185 Chronic kidney disease, stage 5: Secondary | ICD-10-CM | POA: Diagnosis not present

## 2017-07-07 ENCOUNTER — Encounter: Payer: Self-pay | Admitting: Family Medicine

## 2017-07-07 NOTE — Progress Notes (Signed)
I spoke with Meredith Bender, her daughter.  [name] passed away peacefully at the hospice home, [facility].  She said her mom had done really well until about 72 hours prior to going to hospice and that she felt her mom was ready.  Meredith Bender is taking care of her mom's cats.

## 2017-07-13 ENCOUNTER — Telehealth: Payer: Self-pay | Admitting: Family Medicine

## 2017-07-13 NOTE — Telephone Encounter (Signed)
Death Certificate was dropped off by Peter Garter and Barbarann Ehlers and has been placed in PCP's box for completion. Please use blue or black ink. Please return to [place] once completed.

## 2017-07-18 NOTE — Telephone Encounter (Signed)
Received message on nurse line from [place] with [facility] and [facility] reporting patient death today at 1246 L. Silvano Rusk, RN, BSN

## 2017-07-18 DEATH — deceased
